# Patient Record
Sex: Male | Born: 1975 | Race: Black or African American | Hispanic: No | Marital: Single | Smoking: Never smoker
Health system: Southern US, Community
[De-identification: ages and names within clinical notes are randomized; demographics above are authoritative.]

## PROBLEM LIST (undated history)

## (undated) DIAGNOSIS — I679 Cerebrovascular disease, unspecified: Secondary | ICD-10-CM

## (undated) DIAGNOSIS — I10 Essential (primary) hypertension: Secondary | ICD-10-CM

## (undated) DIAGNOSIS — I509 Heart failure, unspecified: Secondary | ICD-10-CM

## (undated) DIAGNOSIS — D649 Anemia, unspecified: Secondary | ICD-10-CM

## (undated) DIAGNOSIS — R0602 Shortness of breath: Secondary | ICD-10-CM

## (undated) DIAGNOSIS — I313 Pericardial effusion (noninflammatory): Secondary | ICD-10-CM

## (undated) DIAGNOSIS — N184 Chronic kidney disease, stage 4 (severe): Secondary | ICD-10-CM

## (undated) DIAGNOSIS — I3139 Other pericardial effusion (noninflammatory): Secondary | ICD-10-CM

## (undated) DIAGNOSIS — I428 Other cardiomyopathies: Secondary | ICD-10-CM

---

## 1998-04-04 ENCOUNTER — Emergency Department (HOSPITAL_COMMUNITY): Admission: EM | Admit: 1998-04-04 | Discharge: 1998-04-04 | Payer: Self-pay | Admitting: Emergency Medicine

## 1999-07-03 ENCOUNTER — Encounter: Payer: Self-pay | Admitting: Emergency Medicine

## 1999-07-03 ENCOUNTER — Emergency Department (HOSPITAL_COMMUNITY): Admission: EM | Admit: 1999-07-03 | Discharge: 1999-07-03 | Payer: Self-pay | Admitting: Internal Medicine

## 2000-02-18 ENCOUNTER — Emergency Department (HOSPITAL_COMMUNITY): Admission: EM | Admit: 2000-02-18 | Discharge: 2000-02-18 | Payer: Self-pay | Admitting: Emergency Medicine

## 2001-04-13 ENCOUNTER — Emergency Department (HOSPITAL_COMMUNITY): Admission: AC | Admit: 2001-04-13 | Discharge: 2001-04-14 | Payer: Self-pay

## 2001-04-15 ENCOUNTER — Emergency Department (HOSPITAL_COMMUNITY): Admission: EM | Admit: 2001-04-15 | Discharge: 2001-04-15 | Payer: Self-pay | Admitting: Emergency Medicine

## 2001-04-20 ENCOUNTER — Emergency Department (HOSPITAL_COMMUNITY): Admission: EM | Admit: 2001-04-20 | Discharge: 2001-04-20 | Payer: Self-pay | Admitting: Emergency Medicine

## 2001-04-22 ENCOUNTER — Encounter: Payer: Self-pay | Admitting: Emergency Medicine

## 2001-04-22 ENCOUNTER — Emergency Department (HOSPITAL_COMMUNITY): Admission: EM | Admit: 2001-04-22 | Discharge: 2001-04-22 | Payer: Self-pay | Admitting: Emergency Medicine

## 2001-04-25 ENCOUNTER — Encounter: Admission: RE | Admit: 2001-04-25 | Discharge: 2001-04-25 | Payer: Self-pay | Admitting: Internal Medicine

## 2001-05-09 ENCOUNTER — Encounter: Admission: RE | Admit: 2001-05-09 | Discharge: 2001-05-09 | Payer: Self-pay | Admitting: Internal Medicine

## 2001-11-21 ENCOUNTER — Encounter: Payer: Self-pay | Admitting: Emergency Medicine

## 2001-11-21 ENCOUNTER — Emergency Department (HOSPITAL_COMMUNITY): Admission: EM | Admit: 2001-11-21 | Discharge: 2001-11-21 | Payer: Self-pay | Admitting: Emergency Medicine

## 2002-02-25 ENCOUNTER — Encounter: Payer: Self-pay | Admitting: *Deleted

## 2002-02-25 ENCOUNTER — Emergency Department (HOSPITAL_COMMUNITY): Admission: EM | Admit: 2002-02-25 | Discharge: 2002-02-25 | Payer: Self-pay

## 2002-06-04 ENCOUNTER — Encounter: Payer: Self-pay | Admitting: Emergency Medicine

## 2002-06-04 ENCOUNTER — Emergency Department (HOSPITAL_COMMUNITY): Admission: EM | Admit: 2002-06-04 | Discharge: 2002-06-04 | Payer: Self-pay | Admitting: Emergency Medicine

## 2002-07-06 ENCOUNTER — Emergency Department (HOSPITAL_COMMUNITY): Admission: EM | Admit: 2002-07-06 | Discharge: 2002-07-06 | Payer: Self-pay | Admitting: Emergency Medicine

## 2002-07-06 ENCOUNTER — Encounter: Payer: Self-pay | Admitting: Emergency Medicine

## 2002-12-28 ENCOUNTER — Emergency Department (HOSPITAL_COMMUNITY): Admission: EM | Admit: 2002-12-28 | Discharge: 2002-12-28 | Payer: Self-pay | Admitting: Emergency Medicine

## 2003-02-28 ENCOUNTER — Emergency Department (HOSPITAL_COMMUNITY): Admission: EM | Admit: 2003-02-28 | Discharge: 2003-02-28 | Payer: Self-pay | Admitting: Emergency Medicine

## 2004-01-12 ENCOUNTER — Emergency Department (HOSPITAL_COMMUNITY): Admission: EM | Admit: 2004-01-12 | Discharge: 2004-01-12 | Payer: Self-pay | Admitting: Family Medicine

## 2004-10-27 ENCOUNTER — Emergency Department (HOSPITAL_COMMUNITY): Admission: EM | Admit: 2004-10-27 | Discharge: 2004-10-27 | Payer: Self-pay | Admitting: Emergency Medicine

## 2005-05-20 ENCOUNTER — Emergency Department (HOSPITAL_COMMUNITY): Admission: EM | Admit: 2005-05-20 | Discharge: 2005-05-20 | Payer: Self-pay | Admitting: Emergency Medicine

## 2005-10-21 ENCOUNTER — Emergency Department (HOSPITAL_COMMUNITY): Admission: EM | Admit: 2005-10-21 | Discharge: 2005-10-21 | Payer: Self-pay | Admitting: Emergency Medicine

## 2006-06-02 ENCOUNTER — Emergency Department (HOSPITAL_COMMUNITY): Admission: EM | Admit: 2006-06-02 | Discharge: 2006-06-02 | Payer: Self-pay | Admitting: Emergency Medicine

## 2006-11-07 ENCOUNTER — Emergency Department (HOSPITAL_COMMUNITY): Admission: EM | Admit: 2006-11-07 | Discharge: 2006-11-07 | Payer: Self-pay | Admitting: Family Medicine

## 2007-01-06 ENCOUNTER — Emergency Department (HOSPITAL_COMMUNITY): Admission: EM | Admit: 2007-01-06 | Discharge: 2007-01-06 | Payer: Self-pay | Admitting: Family Medicine

## 2007-04-09 ENCOUNTER — Emergency Department (HOSPITAL_COMMUNITY): Admission: EM | Admit: 2007-04-09 | Discharge: 2007-04-09 | Payer: Self-pay | Admitting: Emergency Medicine

## 2007-05-31 ENCOUNTER — Emergency Department (HOSPITAL_COMMUNITY): Admission: EM | Admit: 2007-05-31 | Discharge: 2007-06-01 | Payer: Self-pay | Admitting: Emergency Medicine

## 2007-06-01 ENCOUNTER — Emergency Department (HOSPITAL_COMMUNITY): Admission: EM | Admit: 2007-06-01 | Discharge: 2007-06-02 | Payer: Self-pay | Admitting: Emergency Medicine

## 2007-06-07 ENCOUNTER — Emergency Department (HOSPITAL_COMMUNITY): Admission: EM | Admit: 2007-06-07 | Discharge: 2007-06-07 | Payer: Self-pay | Admitting: Emergency Medicine

## 2007-06-08 ENCOUNTER — Emergency Department (HOSPITAL_COMMUNITY): Admission: EM | Admit: 2007-06-08 | Discharge: 2007-06-08 | Payer: Self-pay | Admitting: Emergency Medicine

## 2007-08-04 ENCOUNTER — Emergency Department (HOSPITAL_COMMUNITY): Admission: EM | Admit: 2007-08-04 | Discharge: 2007-08-04 | Payer: Self-pay | Admitting: Emergency Medicine

## 2007-11-17 ENCOUNTER — Emergency Department (HOSPITAL_COMMUNITY): Admission: EM | Admit: 2007-11-17 | Discharge: 2007-11-17 | Payer: Self-pay | Admitting: Emergency Medicine

## 2007-11-18 ENCOUNTER — Emergency Department (HOSPITAL_COMMUNITY): Admission: EM | Admit: 2007-11-18 | Discharge: 2007-11-18 | Payer: Self-pay | Admitting: *Deleted

## 2008-01-28 ENCOUNTER — Emergency Department (HOSPITAL_COMMUNITY): Admission: EM | Admit: 2008-01-28 | Discharge: 2008-01-28 | Payer: Self-pay | Admitting: Family Medicine

## 2008-02-26 ENCOUNTER — Emergency Department (HOSPITAL_COMMUNITY): Admission: EM | Admit: 2008-02-26 | Discharge: 2008-02-26 | Payer: Self-pay | Admitting: Emergency Medicine

## 2008-03-03 ENCOUNTER — Emergency Department (HOSPITAL_COMMUNITY): Admission: EM | Admit: 2008-03-03 | Discharge: 2008-03-03 | Payer: Self-pay | Admitting: Emergency Medicine

## 2008-03-08 ENCOUNTER — Emergency Department (HOSPITAL_COMMUNITY): Admission: EM | Admit: 2008-03-08 | Discharge: 2008-03-09 | Payer: Self-pay | Admitting: Podiatry

## 2008-03-08 ENCOUNTER — Ambulatory Visit: Payer: Self-pay | Admitting: Cardiology

## 2008-03-09 ENCOUNTER — Encounter: Payer: Self-pay | Admitting: Cardiology

## 2008-03-09 ENCOUNTER — Ambulatory Visit: Payer: Self-pay | Admitting: Cardiology

## 2008-03-19 ENCOUNTER — Ambulatory Visit: Payer: Self-pay | Admitting: Cardiology

## 2008-03-24 ENCOUNTER — Emergency Department (HOSPITAL_COMMUNITY): Admission: EM | Admit: 2008-03-24 | Discharge: 2008-03-24 | Payer: Self-pay | Admitting: Emergency Medicine

## 2008-04-02 ENCOUNTER — Ambulatory Visit: Payer: Self-pay

## 2008-04-02 LAB — CONVERTED CEMR LAB
BUN: 14 mg/dL (ref 6–23)
CO2: 29 meq/L (ref 19–32)
Calcium: 9 mg/dL (ref 8.4–10.5)
Chloride: 105 meq/L (ref 96–112)
Creatinine, Ser: 1.2 mg/dL (ref 0.4–1.5)
GFR calc Af Amer: 90 mL/min
GFR calc non Af Amer: 74 mL/min
Glucose, Bld: 87 mg/dL (ref 70–99)
Potassium: 4.3 meq/L (ref 3.5–5.1)
Sodium: 138 meq/L (ref 135–145)

## 2008-05-07 ENCOUNTER — Emergency Department (HOSPITAL_COMMUNITY): Admission: EM | Admit: 2008-05-07 | Discharge: 2008-05-07 | Payer: Self-pay | Admitting: Family Medicine

## 2008-06-01 ENCOUNTER — Emergency Department (HOSPITAL_COMMUNITY): Admission: EM | Admit: 2008-06-01 | Discharge: 2008-06-01 | Payer: Self-pay | Admitting: Family Medicine

## 2008-06-01 ENCOUNTER — Emergency Department (HOSPITAL_COMMUNITY): Admission: EM | Admit: 2008-06-01 | Discharge: 2008-06-01 | Payer: Self-pay | Admitting: Emergency Medicine

## 2008-06-04 ENCOUNTER — Ambulatory Visit (HOSPITAL_COMMUNITY): Admission: RE | Admit: 2008-06-04 | Discharge: 2008-06-04 | Payer: Self-pay | Admitting: Chiropractic Medicine

## 2008-06-05 ENCOUNTER — Emergency Department (HOSPITAL_COMMUNITY): Admission: EM | Admit: 2008-06-05 | Discharge: 2008-06-05 | Payer: Self-pay | Admitting: Emergency Medicine

## 2008-08-18 ENCOUNTER — Emergency Department (HOSPITAL_COMMUNITY): Admission: EM | Admit: 2008-08-18 | Discharge: 2008-08-18 | Payer: Self-pay | Admitting: Emergency Medicine

## 2008-11-03 ENCOUNTER — Emergency Department (HOSPITAL_COMMUNITY): Admission: EM | Admit: 2008-11-03 | Discharge: 2008-11-03 | Payer: Self-pay | Admitting: Emergency Medicine

## 2009-02-03 ENCOUNTER — Emergency Department (HOSPITAL_COMMUNITY): Admission: EM | Admit: 2009-02-03 | Discharge: 2009-02-03 | Payer: Self-pay | Admitting: Emergency Medicine

## 2009-02-04 ENCOUNTER — Inpatient Hospital Stay (HOSPITAL_COMMUNITY): Admission: EM | Admit: 2009-02-04 | Discharge: 2009-02-06 | Payer: Self-pay | Admitting: Emergency Medicine

## 2009-08-18 ENCOUNTER — Inpatient Hospital Stay (HOSPITAL_COMMUNITY): Admission: EM | Admit: 2009-08-18 | Discharge: 2009-08-27 | Payer: Self-pay | Admitting: Emergency Medicine

## 2009-08-18 ENCOUNTER — Ambulatory Visit: Payer: Self-pay | Admitting: Internal Medicine

## 2009-08-21 ENCOUNTER — Encounter: Payer: Self-pay | Admitting: Internal Medicine

## 2009-08-21 ENCOUNTER — Ambulatory Visit: Payer: Self-pay | Admitting: Cardiothoracic Surgery

## 2009-08-26 ENCOUNTER — Encounter: Payer: Self-pay | Admitting: Cardiology

## 2009-08-30 ENCOUNTER — Telehealth: Payer: Self-pay | Admitting: Internal Medicine

## 2009-09-05 ENCOUNTER — Ambulatory Visit: Payer: Self-pay | Admitting: Internal Medicine

## 2009-10-06 ENCOUNTER — Ambulatory Visit (HOSPITAL_COMMUNITY): Admission: RE | Admit: 2009-10-06 | Discharge: 2009-10-06 | Payer: Self-pay | Admitting: Internal Medicine

## 2009-10-18 ENCOUNTER — Encounter (INDEPENDENT_AMBULATORY_CARE_PROVIDER_SITE_OTHER): Payer: Self-pay | Admitting: *Deleted

## 2010-08-14 ENCOUNTER — Emergency Department (HOSPITAL_COMMUNITY): Admission: EM | Admit: 2010-08-14 | Discharge: 2010-08-14 | Payer: Self-pay | Admitting: Family Medicine

## 2010-08-18 ENCOUNTER — Emergency Department (HOSPITAL_COMMUNITY): Admission: EM | Admit: 2010-08-18 | Discharge: 2010-08-18 | Payer: Self-pay | Admitting: Family Medicine

## 2010-12-10 ENCOUNTER — Encounter: Payer: Self-pay | Admitting: Cardiothoracic Surgery

## 2011-02-01 LAB — POCT I-STAT, CHEM 8
BUN: 14 mg/dL (ref 6–23)
Calcium, Ion: 1.19 mmol/L (ref 1.12–1.32)
Chloride: 104 mEq/L (ref 96–112)

## 2011-02-22 LAB — ALDOSTERONE + RENIN ACTIVITY W/ RATIO
ALDO / PRA Ratio: 7.4 Ratio (ref 0.9–28.9)
Aldosterone: 2 ng/dL
PRA LC/MS/MS: 0.27 ng/mL/h (ref 0.25–5.82)

## 2011-02-22 LAB — RETICULOCYTES
RBC.: 3.22 MIL/uL — ABNORMAL LOW (ref 4.22–5.81)
Retic Ct Pct: 0.7 % (ref 0.4–3.1)

## 2011-02-22 LAB — LUPUS ANTICOAGULANT PANEL
Drvvt confirmation: 1.23 Ratio — ABNORMAL HIGH (ref <1.18)
Lupus Anticoagulant: DETECTED — AB
PTTLA 4:1 Mix: 47.4 secs (ref 36.3–48.8)
PTTLA Confirmation: 8.7 secs — ABNORMAL HIGH (ref <8.0)
dRVVT Incubated 1:1 Mix: 42.1 secs (ref 36.1–47.0)

## 2011-02-22 LAB — PROTEIN, URINE, 24 HOUR
Collection Interval-UPROT: 24 hours
Protein, 24H Urine: 225 mg/d — ABNORMAL HIGH (ref 50–100)
Urine Total Volume-UPROT: 1250 mL

## 2011-02-22 LAB — DIFFERENTIAL
Basophils Absolute: 0 10*3/uL (ref 0.0–0.1)
Eosinophils Absolute: 0 10*3/uL (ref 0.0–0.7)
Eosinophils Relative: 0 % (ref 0–5)
Eosinophils Relative: 2 % (ref 0–5)
Lymphocytes Relative: 14 % (ref 12–46)
Lymphs Abs: 1 10*3/uL (ref 0.7–4.0)
Lymphs Abs: 1.2 10*3/uL (ref 0.7–4.0)
Monocytes Absolute: 1.1 10*3/uL — ABNORMAL HIGH (ref 0.1–1.0)
Monocytes Absolute: 1.6 10*3/uL — ABNORMAL HIGH (ref 0.1–1.0)
Monocytes Relative: 13 % — ABNORMAL HIGH (ref 3–12)

## 2011-02-22 LAB — RENAL FUNCTION PANEL
BUN: 18 mg/dL (ref 6–23)
CO2: 26 mEq/L (ref 19–32)
Chloride: 101 mEq/L (ref 96–112)
Creatinine, Ser: 1.28 mg/dL (ref 0.4–1.5)
Potassium: 3.5 mEq/L (ref 3.5–5.1)

## 2011-02-22 LAB — BASIC METABOLIC PANEL
BUN: 15 mg/dL (ref 6–23)
Calcium: 8.5 mg/dL (ref 8.4–10.5)
Calcium: 8.7 mg/dL (ref 8.4–10.5)
Chloride: 100 mEq/L (ref 96–112)
Chloride: 96 mEq/L (ref 96–112)
Creatinine, Ser: 1.03 mg/dL (ref 0.4–1.5)
GFR calc Af Amer: >60 mL/min (ref >60)
GFR calc Af Amer: >60 mL/min (ref >60)
GFR calc Af Amer: >60 mL/min (ref >60)
GFR calc non Af Amer: >60 mL/min (ref >60)
GFR calc non Af Amer: >60 mL/min (ref >60)
GFR calc non Af Amer: >60 mL/min (ref >60)
Glucose, Bld: 101 mg/dL — ABNORMAL HIGH (ref 70–99)
Glucose, Bld: 102 mg/dL — ABNORMAL HIGH (ref 70–99)
Potassium: 3.7 mEq/L (ref 3.5–5.1)
Potassium: 3.8 mEq/L (ref 3.5–5.1)
Sodium: 134 mEq/L — ABNORMAL LOW (ref 135–145)
Sodium: 138 mEq/L (ref 135–145)

## 2011-02-22 LAB — CARDIAC PANEL(CRET KIN+CKTOT+MB+TROPI)
CK, MB: 1.4 ng/mL (ref 0.3–4.0)
Relative Index: INVALID (ref 0.0–2.5)
Relative Index: INVALID (ref 0.0–2.5)
Troponin I: 0.02 ng/mL (ref 0.00–0.06)
Troponin I: 0.03 ng/mL (ref 0.00–0.06)

## 2011-02-22 LAB — METANEPHRINES, URINE, 24 HOUR: Metaneph Total, Ur: 1232 mcg/24 h — ABNORMAL HIGH (ref 115–695)

## 2011-02-22 LAB — CBC
HCT: 30.3 % — ABNORMAL LOW (ref 39.0–52.0)
HCT: 30.5 % — ABNORMAL LOW (ref 39.0–52.0)
HCT: 31.3 % — ABNORMAL LOW (ref 39.0–52.0)
HCT: 32.9 % — ABNORMAL LOW (ref 39.0–52.0)
Hemoglobin: 10.3 g/dL — ABNORMAL LOW (ref 13.0–17.0)
Hemoglobin: 10.6 g/dL — ABNORMAL LOW (ref 13.0–17.0)
MCHC: 33.7 g/dL (ref 30.0–36.0)
MCV: 91.6 fL (ref 78.0–100.0)
MCV: 92.2 fL (ref 78.0–100.0)
MCV: 92.3 fL (ref 78.0–100.0)
Platelets: 360 10*3/uL (ref 150–400)
Platelets: 364 10*3/uL (ref 150–400)
RBC: 3.3 MIL/uL — ABNORMAL LOW (ref 4.22–5.81)
RBC: 3.33 MIL/uL — ABNORMAL LOW (ref 4.22–5.81)
RDW: 13.5 % (ref 11.5–15.5)
RDW: 13.5 % (ref 11.5–15.5)
WBC: 11.8 10*3/uL — ABNORMAL HIGH (ref 4.0–10.5)
WBC: 7.8 10*3/uL (ref 4.0–10.5)

## 2011-02-22 LAB — CYCLIC CITRUL PEPTIDE ANTIBODY, IGG: Cyclic Citrullin Peptide Ab: 1.8 U/mL (ref <7)

## 2011-02-22 LAB — TSH: TSH: 2.098 u[IU]/mL (ref 0.350–4.500)

## 2011-02-22 LAB — BETA-2-GLYCOPROTEIN I ABS, IGG/M/A
Beta-2 Glyco I IgG: <3 U/mL (ref <=15)
Beta-2-Glycoprotein I IgM: 6 U/mL (ref <=15)

## 2011-02-22 LAB — LIPID PANEL
Cholesterol: 133 mg/dL (ref 0–200)
LDL Cholesterol: 91 mg/dL (ref 0–99)
VLDL: 24 mg/dL (ref 0–40)

## 2011-02-22 LAB — CORTISOL: Cortisol, Plasma: 14.8 ug/dL

## 2011-02-22 LAB — VITAMIN B12: Vitamin B-12: 403 pg/mL (ref 211–911)

## 2011-02-22 LAB — RHEUMATOID FACTOR: Rhuematoid fact SerPl-aCnc: 56 IU/mL — ABNORMAL HIGH (ref 0–20)

## 2011-02-22 LAB — CARDIOLIPIN ANTIBODIES, IGM+IGG: Anticardiolipin IgG: <10 GPL U/mL — ABNORMAL LOW (ref <10)

## 2011-02-22 LAB — COMPLEMENT, TOTAL: Compl, Total (CH50): >60 U/mL — ABNORMAL HIGH (ref 31–60)

## 2011-02-22 LAB — HIV ANTIBODY (ROUTINE TESTING W REFLEX): HIV: NONREACTIVE

## 2011-02-22 LAB — HEPATITIS PANEL, ACUTE
Hep A IgM: NEGATIVE
Hep B C IgM: NEGATIVE
Hepatitis B Surface Ag: NEGATIVE

## 2011-02-22 LAB — CRYOGLOBULIN: Cryoglobulin: DETECTED

## 2011-02-22 LAB — ANTI-RIBONUCLEIC ACID ANTIBODY: Ribonucleic Protein(ENA) Antibody, IgG: >8 AI — ABNORMAL HIGH (ref <1.0)

## 2011-02-22 LAB — C4 COMPLEMENT: Complement C4, Body Fluid: 12 mg/dL — ABNORMAL LOW (ref 16–47)

## 2011-02-22 LAB — GLUCOSE 6 PHOSPHATE DEHYDROGENASE: G-6-PD, Quant: 11 U/g{Hb} (ref 7–20)

## 2011-02-22 LAB — C-REACTIVE PROTEIN: CRP: 10.9 mg/dL — ABNORMAL HIGH (ref <0.6)

## 2011-02-23 LAB — CBC
HCT: 33.5 % — ABNORMAL LOW (ref 39.0–52.0)
MCV: 92.5 fL (ref 78.0–100.0)
Platelets: 280 10*3/uL (ref 150–400)
RDW: 13.6 % (ref 11.5–15.5)
WBC: 9.8 10*3/uL (ref 4.0–10.5)

## 2011-02-23 LAB — URINALYSIS, ROUTINE W REFLEX MICROSCOPIC
Glucose, UA: 100 mg/dL — AB
Ketones, ur: 15 mg/dL — AB
Leukocytes, UA: NEGATIVE
pH: 6 (ref 5.0–8.0)

## 2011-02-23 LAB — TSH: TSH: 0.979 u[IU]/mL (ref 0.350–4.500)

## 2011-02-23 LAB — BASIC METABOLIC PANEL
BUN: 10 mg/dL (ref 6–23)
Chloride: 102 mEq/L (ref 96–112)
Glucose, Bld: 101 mg/dL — ABNORMAL HIGH (ref 70–99)
Potassium: 4.1 mEq/L (ref 3.5–5.1)

## 2011-02-23 LAB — URINE DRUGS OF ABUSE SCREEN W ALC, ROUTINE (REF LAB)
Amphetamine Screen, Ur: NEGATIVE
Barbiturate Quant, Ur: NEGATIVE
Benzodiazepines.: NEGATIVE
Propoxyphene: NEGATIVE

## 2011-02-23 LAB — URINE MICROSCOPIC-ADD ON

## 2011-02-23 LAB — POCT CARDIAC MARKERS
CKMB, poc: 2.1 ng/mL (ref 1.0–8.0)
Myoglobin, poc: 150 ng/mL (ref 12–200)
Troponin i, poc: <0.05 ng/mL (ref 0.00–0.09)

## 2011-02-23 LAB — DIFFERENTIAL
Eosinophils Absolute: 0.1 10*3/uL (ref 0.0–0.7)
Eosinophils Relative: 1 % (ref 0–5)
Lymphs Abs: 1.2 10*3/uL (ref 0.7–4.0)

## 2011-02-23 LAB — CULTURE, BLOOD (ROUTINE X 2): Culture: NO GROWTH

## 2011-02-23 LAB — HEMOGLOBIN A1C: Hgb A1c MFr Bld: 5.6 % (ref 4.6–6.1)

## 2011-02-23 LAB — CK TOTAL AND CKMB (NOT AT ARMC)
Relative Index: INVALID (ref 0.0–2.5)
Total CK: 67 U/L (ref 7–232)

## 2011-03-01 LAB — CBC
HCT: 41.5 % (ref 39.0–52.0)
MCHC: 34.1 g/dL (ref 30.0–36.0)
MCV: 93.6 fL (ref 78.0–100.0)
Platelets: 226 10*3/uL (ref 150–400)
RBC: 4.44 MIL/uL (ref 4.22–5.81)
RDW: 13.2 % (ref 11.5–15.5)
WBC: 7.7 10*3/uL (ref 4.0–10.5)

## 2011-03-01 LAB — COMPREHENSIVE METABOLIC PANEL
AST: 46 U/L — ABNORMAL HIGH (ref 0–37)
Albumin: 4.4 g/dL (ref 3.5–5.2)
Alkaline Phosphatase: 93 U/L (ref 39–117)
CO2: 28 mEq/L (ref 19–32)
Chloride: 101 mEq/L (ref 96–112)
Creatinine, Ser: 1.11 mg/dL (ref 0.4–1.5)
GFR calc Af Amer: >60 mL/min (ref >60)
GFR calc non Af Amer: >60 mL/min (ref >60)
Potassium: 4.3 mEq/L (ref 3.5–5.1)
Total Bilirubin: 0.5 mg/dL (ref 0.3–1.2)

## 2011-03-01 LAB — DIFFERENTIAL
Basophils Absolute: 0 10*3/uL (ref 0.0–0.1)
Basophils Relative: 0 % (ref 0–1)
Eosinophils Absolute: 0 10*3/uL (ref 0.0–0.7)
Eosinophils Relative: 1 % (ref 0–5)
Lymphocytes Relative: 19 % (ref 12–46)
Monocytes Absolute: 0.6 10*3/uL (ref 0.1–1.0)

## 2011-03-01 LAB — CARDIAC PANEL(CRET KIN+CKTOT+MB+TROPI)
CK, MB: 3.4 ng/mL (ref 0.3–4.0)
CK, MB: 4 ng/mL (ref 0.3–4.0)
Relative Index: 2.4 (ref 0.0–2.5)
Total CK: 145 U/L (ref 7–232)
Total CK: 170 U/L (ref 7–232)
Troponin I: 0.02 ng/mL (ref 0.00–0.06)

## 2011-03-01 LAB — LIPID PANEL
Cholesterol: 164 mg/dL (ref 0–200)
Total CHOL/HDL Ratio: 6.3 RATIO
VLDL: 26 mg/dL (ref 0–40)

## 2011-03-01 LAB — BASIC METABOLIC PANEL
BUN: 18 mg/dL (ref 6–23)
Calcium: 8.4 mg/dL (ref 8.4–10.5)
Creatinine, Ser: 1.31 mg/dL (ref 0.4–1.5)
GFR calc non Af Amer: >60 mL/min (ref >60)
Glucose, Bld: 102 mg/dL — ABNORMAL HIGH (ref 70–99)
Sodium: 136 mEq/L (ref 135–145)

## 2011-03-01 LAB — POCT CARDIAC MARKERS
Troponin i, poc: <0.05 ng/mL (ref 0.00–0.09)
Troponin i, poc: <0.05 ng/mL (ref 0.00–0.09)

## 2011-04-03 NOTE — Discharge Summary (Signed)
[name], [name]               ACCOUNT NO.:  [id number]   MEDICAL RECORD NO.:  [id number]          PATIENT TYPE:  INP   LOCATION:  1437                         FACILITY:  [facility]   PHYSICIAN:  Monte Fantasia, MD  DATE OF BIRTH:  1976-04-11   DATE OF ADMISSION:  02/03/2009  DATE OF DISCHARGE:  02/06/2009                               DISCHARGE SUMMARY   PRIMARY CARE PHYSICIAN:  Unassigned.   DISCHARGE DIAGNOSES:  1. Hypertensive urgency.  2. Uncontrolled hypertension.  3. Medical noncompliance due to loss of insurance.  4. Incipient dental infection.  5. Acute bronchitis.   DISCHARGE MEDICATIONS:  1. Hydrochlorothiazide 25 mg p.o. daily.  2. Benazepril 40 mg p.o. daily.  3. Carvedilol 25 mg p.o. b.i.d.  4. Clonidine 0.1 mg p.o. t.i.d.  5. Clindamycin 300 mg p.o. t.i.d. for 2 days.  6. Lactinex 1 tablet four times a day for 1 week.   HOSPITAL COURSE:  [name] is a 35 year old African American  gentleman patient who was admitted on March 19 with complaints of  nausea, vomiting and headache.  The patient on arrival in the emergency  room had a blood pressure found to be elevated to 209/127.  The patient  was admitted for hypertensive urgency and was started on nitroglycerin  drip.  The patient was admitted to the step down and blood pressure was  controlled with IV medications on telemetry monitoring.  The patient's  cardiac enzymes were cycled x3 which were negative.  The patient was  successfully transitioned to oral medications.  The patient's blood  pressure has been controlled for the last 24 hours on oral medications  and the patient is medically stable to be discharged.   LABS UPON DISCHARGE:  Total WBC 8.4, hemoglobin 12.4, hematocrit 36.4,  platelets of 226, sodium 136, potassium 3.5, chloride 104, bicarb 25,  glucose 102, BUN 18, creatinine 1.3.  Cardiac markers x3 sets have been  negative.  Total cholesterol 164, triglycerides 130, HDL 26, LDL 112,  TSH 0.938, homocysteine 9.2.   The patient at present does not have any medical insurance and hence was  given medications supply collected from the [facility] 4 dollar list.  Also  social work evaluation was asked for the same and we will give 3 day  supply at the time of discharge.  The patient is given [employer]  numbers to contact and asked to be following up with [employer] within  2 weeks.  The patient at present is medically stable to be discharged  and can be discharged home.   DISPOSITION:  The patient at present is medically stable to be  discharged and will be discharged home today.  Will give him 3 day  supply for the antihypertensive medication.      Monte Fantasia, MD  Electronically Signed     MP/MEDQ  D:  02/06/2009  T:  02/06/2009  Job:  [id number]

## 2011-04-03 NOTE — Discharge Summary (Signed)
Matthew Beard, Matthew Beard               ACCOUNT NO.:  [id number]   MEDICAL RECORD NO.:  [id number]          PATIENT TYPE:  OBV   LOCATION:  0108                         FACILITY:  [facility]   PHYSICIAN:  Rollene Rotunda, MD, FACCDATE OF BIRTH:  12-14-1975   DATE OF ADMISSION:  03/08/2008  DATE OF DISCHARGE:                               DISCHARGE SUMMARY   DISCHARGE DIAGNOSES:  1. Pleuritic chest pain.  2. Penile ulcer.  3. Hypertension.   HOSPITAL COURSE:  The patient presented to the hospital with chest  discomfort.  The patient developed this on the day before admission.  He  describes it as sharp.  He felt discomfort taking a deep breath.  He did  have some slight fevers and chills.  He took some Tylenol and Motrin  without improvement.  It was 9 out of 10 at its worst.  He came to the  emergency room.  He was treated with Toradol.  He had no improvement.  He subsequently got nitroglycerin.  There was no improvement.  He  finally got morphine.  On physical exam, a rub was appreciated.  His  enzymes were negative.  His EKG demonstrated LVH by voltage criteria.  There was some diffuse ST segment elevation, and T-wave inversion  consistent with repolarization changes with a possible pericarditis.   Of note, the patient had recently been treated symptomatically for an  enlarged lymph node in his right groin.  He had a penile ulcer that had  been seen at the health department and treated for chlamydia.  He did  have a follow up with urology on the day he was in the emergency room.   Of note, his blood pressure was elevated, but he had not gotten  prescriptions for Lotrel and hydrochlorothiazide refilled.  On the day  of discharge, the patient was still complaining of some mild pleuritic-  type chest discomfort.  It seemed to have gone to his shoulder.  There  were no enzyme elevations.  All of his troponins were negative.  His CT  done in the ER to evaluate a mildly elevated D-dimer  demonstrated no  evidence of pulmonary emboli.  There was a minimal atelectasis at the  lung bases.  There was no adenopathy.  His aorta was normal.  He said he  had improved and was ready to go home.   At the time of this discharge, an echocardiogram had been performed.  The preliminary demonstrated some minimal LVH but no regional wall  motion abnormalities.  He had normal LV function and no significant  valvular abnormalities.  The final is pending.   During this hospitalization, we did get urology to see him for his  penile ulcer.  They suggested that this could be penile cancer but more  likely infection.  The differential included syphilis, chancroid, and  lymphogranuloma venereum.  It was less likely to be HSV or granuloma  inguinale.  FTA antibodies were drawn, and HIV was drawn.  He was  empirically given one dose of azithromycin.  He had instructions written  out to treat him with doxycycline 100  mg b.i.d. times three weeks.   DISCHARGE MEDICATIONS:  1. Lotrel 10/20 mg daily.  2. Hydrochlorothiazide 12.5 mg daily.  3. Indomethacin 50 mg q 8 hours times 10 days total.  4. Doxycycline 100 mg p.o. b.i.d. times three weeks.   DISCHARGE FOLLOW UP:  The patient was given a follow up with Dr. Laverle Patter  on May the 22nd at 2:15.  His office was going to call when they knew of  a time they could move this up by one week per Dr. Vevelyn Royals  instructions.  I tried to get this over the phone but was told that they  would have to speak with Dr. Laverle Patter personally, and that they would  arrange this follow up at a sooner date.  The patient understands the  importance of this follow up.  He may need a biopsy of his swollen lymph  node.  He may need a biopsy of the ulcer.  The patient will also see Dr.  Antoine Poche on Friday May the 15th at the [facility] office at 3:45 p.m.   SPECIAL INSTRUCTIONS:  The patient is to stop smoking.  He is to avoid  drugs (his marijuana test was positive).  He  should avoid sexual contact  until he has this ulcer identified and appropriately treated.  He is to  take his medications as listed.  He needs to return to the emergency  room if he has any worsening symptoms.      Rollene Rotunda, MD, [facility]  Electronically Signed     JH/MEDQ  D:  03/09/2008  T:  03/09/2008  Job:  [id number]

## 2011-04-03 NOTE — H&P (Signed)
NAMEJASANI, DOLNEY               ACCOUNT NO.:  [id number]   MEDICAL RECORD NO.:  [id number]          PATIENT TYPE:  INP   LOCATION:  1240                         FACILITY[facility]  [facility]   PHYSICIAN:  Vania Rea, M.D. DATE OF BIRTH:  [birth year]   DATE OF ADMISSION:  02/03/2009  DATE OF DISCHARGE:                              HISTORY & PHYSICAL   PRIMARY CARE PHYSICIAN:  Unassigned.   CHIEF COMPLAINT:  Headache and vomiting.   HISTORY OF PRESENT ILLNESS:  This is a 35 year old African American  gentleman with a history of hypertension who for the past week has been  having wheezing and cough productive of clear sputum and also for the  past few days developed pain in his left jaw and ear.  Came to the  emergency room yesterday morning and was evaluated and felt to be having  an infected tooth socket causing referred earache.  Was given an  injection of Dilaudid, sent home with Vicodin and told to follow up with  a dentist.  The patient was also checked and found to have elevated  blood pressure and advised to resume his home medications.  On arriving  home, before he could take any of the prescribed medications, the  patient started to vomit and developed severe headache.  He returned to  the emergency room where his blood pressure was found to be elevated  209/127.  He was assessed as having hypertensive urgency and started on  __________ drip, and the hospitalist service called to assist with  management.   The patient denies any fever or runny nose.  He denies any exposure to  sick contacts.  He denies any past history of asthma.  He recently lost  his job and therefore no longer has a primary care physician, and in  fact before the past 24 hours has not taken any antihypertensives for at  least 2 month.  He has had no syncope or chest pain.   PAST MEDICAL HISTORY:  1. Hypertension.  2. Episodic headaches when blood pressure is not controlled.   MEDICATIONS:  Has been  noncompliant but has been prescribed  hydrochlorothiazide, amlodipine, benazepril, carvedilol.  Today, he was  prescribed albuterol for his wheezing and Vicodin for his earache.   ALLERGIES:  AMOXIL causes hives.   SOCIAL HISTORY:  Smokes half a pack per day.  Denies alcohol or illicit  drug use.  He is an unemployed Estate agent.   FAMILY HISTORY:  Is significant for heart attacks and strokes in both  parents who have died from these conditions.   REVIEW OF SYSTEMS:  Other than noted above, a 10-point review of systems  is unremarkable.   PHYSICAL EXAMINATION:  A well-built young African American gentleman  sitting up on the stretcher.  Does not appear acutely distressed.  VITALS:  His temperature is 97.7, pulse 84, respiration 18, blood  pressure now 176/116 on a Cardene drip.  He saturating at 100% on room  air.  He is having no pain.  Pupils are round and equal.  Mucous membranes pink and anicteric.  He  has no  oral Candida.  No jugular venous distention.  His left jaw is  somewhat prominent and swollen but not tender.  EARS: The left ear is  unremarkable.  The right ear has accumulation of pus.  No thyromegaly or  jugular venous distention.  CHEST:  He has diffuse rhonchi bilaterally.  CARDIOVASCULAR SYSTEM:  Regular rhythm with no murmurs.  ABDOMEN:  Is soft and nontender.  No masses.  EXTREMITIES:  Without edema.  He has 2+ dorsalis pedis pulses  bilaterally.  CENTRAL NERVOUS SYSTEM:  Cranial nerves II-XII are grossly, and he has  no focal neurologic deficit.   LABORATORY DATA:  CBC is reviewed and is unremarkable.  His serum  chemistry is reviewed and is likewise unremarkable.  His cardiac enzymes  are completely normal.  CT scan of his brain showed no acute  abnormality.  Chest x-ray has not yet been done.   ASSESSMENT:  1. Hypertensive urgency.  2. Incipient dental infection.  3. Acute bronchitis.   PLAN:  Will admit this gentleman for treatment of his  hypertensive  urgency.  Will transition to oral medications as soon as possible.  We  will give oral clindamycin for his incipient dental infection and will  cover his GI tract with Lactinex.  Will give Xopenex for his wheezing.  Other plans as per orders.      Vania Rea, M.D.  Electronically Signed     LC/MEDQ  D:  02/04/2009  T:  02/04/2009  Job:  [id number]

## 2011-04-03 NOTE — H&P (Signed)
NAMEGALAN, Matthew Beard               ACCOUNT NO.:  [id number]   MEDICAL RECORD NO.:  [id number]          PATIENT TYPE:  OBV   LOCATION:  0108                         FACILITY:  [facility]   PHYSICIAN:  Rollene Rotunda, MD, FACCDATE OF BIRTH:  06/04/1976   DATE OF ADMISSION:  03/08/2008  DATE OF DISCHARGE:                              HISTORY & PHYSICAL   Matthew Beard is a 35 year old, African-American gentleman with no known  history of coronary artery disease but with history of elevated blood  pressure who presented to [facility] emergency room complaining of  chest discomfort.  His cardiologist will be Dr. Antoine Poche.  Primary care,  the patient is followed by Matthew [facility] at [facility].  [name] describes an episode of chest discomfort that began Sunday  evening around 6:00.  It was associated with fever and chills.  He  describes it as a sharp pain that leaves him unable to take a deep  breath.  He states he was sitting at the baseball game, felt fine prior,  went home, began to experience discomfort with the fever and chills.  He  took some Tylenol and Motrin.  This relieved the chills but the chest  discomfort continued.  He states he had difficulty sleeping last night  secondary to the chest discomfort.  He described it as a substernal  chest pain radiating into the left side of his back, associated with  some diaphoresis, fever and chills without dizziness or shortness of  breath.  He described it as a 9 at its worse and currently is a 7.  Here  in the emergency room, he was given Toradol; 15 minutes later still  having pain, he was given nitroglycerin and morphine with some relief,  but the chest discomfort continues.  Also, Matthew Beard past medical  history recently includes a workup April 9 for a canker sore to his  penis and enlarged lymph node on the right groin.  The patient states he  was treated with antibiotics and Motrin and told to follow-up with the  help the health department.  He also had a chlamydia of test that was  done that was negative per patient's report.  Matthew Beard states he has  a follow-up appointment scheduled at [facility] tomorrow, but the  chest discomfort became so worrisome that he came to the emergency room  to get that evaluated first.   ALLERGIES:  INCLUDE AMOXICILLIN.   CURRENT MEDICATIONS:  Include Lotrel 10 mg and hydrochlorothiazide 12.5  mg.  The patient states he ran out of these medications one week ago.   PAST MEDICAL HISTORY:  Includes hypertension and a surgery for broken  jaw and ongoing tobacco use.   SOCIAL HISTORY:  He lives here in [place] alone.  He does have a 45-  year-old son.  He works for [employer] first  shift doing a lot of heavy lifting and drinks at least one 6-pack on the  weekend.  Denies any drug or recreational substance use.   FAMILY HISTORY:  Both parents with history of hypertension and both  deceased in their 44s and early 51s secondary to CVA.  He has brothers  with hypertension.   REVIEW OF SYSTEMS:  As stated above in history of present illness,  otherwise negative review of 10 systems.   PHYSICAL EXAMINATION:  Blood pressure 154/99, heart rate 67,  respirations 18, temperature 97.5, saturation 98% on room air.  HEENT:  Normal.  NECK:  Supple without lymphadenopathy, bruits, or JVD.  CARDIOVASCULAR EXAM:  Reveals S1-S2, positive pericardial rub.  LUNGS:  Clear to auscultation bilaterally.  ABDOMEN:  Soft, nontender, positive bowel sounds.  RIGHT GROIN:  Enlarged lymph node palpated.  SKIN:  Is warm and dry.  LOWER EXTREMITIES:  Without clubbing, cyanosis, or edema.  NEUROLOGICALLY:  Alert and oriented x3.   Chest CT:  Negative for pulmonary emboli, minimal atelectasis at lung  bases.  No fluid.  EKG:  Sinus rhythm with diffuse ST elevation, LVH  positive, rate 61.  CBC:  H&H 13.8 and 41.4, WBCs 11.8, platelets  226,000.  D-dimer  0.46.  Point of cares negative x1 set.  Sodium 137,  potassium 4.0, chloride 99, CO2 31, BUN 10, creatinine 1.16, glucose  103.   IMPRESSION:  1. Chest pain, most likely with abnormal EKG and pericardial rub      positive for pericarditis.  2. Hypertension.  Continue home medications.  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  4. Positive tobacco use, smoking cessation.  5. Enlarged lymph node.  Will need inpatient urology consult.  The      plan is to admit patient, treat him with Indocin, Protonix, order 2-      D echocardiogram, have urology see the patient, and will go from      there.      Dorian Pod, ACNP      Rollene Rotunda, MD, [facility]  Electronically Signed    MB/MEDQ  D:  03/08/2008  T:  03/08/2008  Job:  [id number]

## 2011-04-03 NOTE — Consult Note (Signed)
[name], [name]               ACCOUNT NO.:  [id number]   MEDICAL RECORD NO.:  [id number]          PATIENT TYPE:  OBV   LOCATION:  0108                         FACILITY:  [facility]   PHYSICIAN:  Heloise Purpura, MD      DATE OF BIRTH:  [birth year]   DATE OF CONSULTATION:  03/08/2008  DATE OF DISCHARGE:                                 CONSULTATION   REASON FOR CONSULTATION:  Penile lesion.   HISTORY:  Matthew Beard is a 35 year old gentleman seen in consultation at the  request of Dr. Rollene Rotunda for a penile lesion.  He first presented  to the emergency department on February 26, 2008, after noticing a penile  lesion on his right penile shaft.  At that time, this lesion was  painless.  He underwent evaluation including what appears to be culture  which did not demonstrate any organisms.  He also underwent testing for  gonorrhea, chlamydia and an RPR which were all negative.  He was  instructed to take anti-inflammatories and follow up with either the  health department or urology.  He was scheduled to see urology as an  outpatient tomorrow.  Approximately one week after his initial  presentation to the emergency department, he did note enlarged painful  right inguinal lymphadenopathy.  He again presented to the emergency  department and was recommended to use warm compresses and told to keep  his appointment with urology.  He has not been treated with any  antibiotics.  He denies any fever, nausea, vomiting, dysuria, urinary  frequency, urgency or other voiding symptoms.  He denies any hematuria,  urinary tract infections, history of STDs, urolithiasis, GU malignancy,  trauma or surgery.The patient is heterosexual and has been in a  monogamous relationship.  He is sexually active.  His last sexual  encounter was approximately five days prior to his presentation in the  emergency department.   He is admitted today after presenting with chest pain to the emergency  department and is currently  undergoing evaluation by Dr. Rollene Rotunda  for this reason.  It was felt that the patient should be evaluated as he  was scheduled to undergo an outpatient urologic evaluation tomorrow  anyway.   PAST MEDICAL HISTORY:  Hypertension.   PAST SURGICAL HISTORY:  Jaw surgery.   MEDICATIONS:  1. Lotrel.  2. Hydrochlorothiazide.   ALLERGIES:  PENICILLIN CAUSES HIVES.   SOCIAL HISTORY:  The patient does drink at least a six-pack of beer per  weekend.  He does currently smoke.  He denies illicit drug use.  He  lives alone though does have a 27-year-old son.   FAMILY HISTORY:  Both parents had hypertension and died of complications  related to cerebrovascular disease.   REVIEW OF SYSTEMS:  Negative except as in history of present illness.   PHYSICAL EXAMINATION:  VITAL SIGNS:  Temperature 97.5, heart rate 67,  respirations 18, blood pressure 154/99.  CONSTITUTIONAL:  Well-nourished, well-developed, age-appropriate male in  no acute distress.  NECK:  No neck masses or JVD.  CARDIOVASCULAR:  Regular rate and rhythm.  LUNGS:  Clear bilaterally.  Normal respiratory effort.  ABDOMEN:  Soft, nondistended and nontender without masses.  No CVA  tenderness.  GU:  The patient has a circumcised male phallus and normal urethral  meatus.  He does have a 1.5 cm ulcerated lesion toward the right base of  his penis on the lateral aspect of the penile shaft.  This lesion does  have a mildly erythematous base.  It is nontender and not fixed.  There  does not appear to be any invasion of the underlying corporal bodies.  Testes are descended bilaterally and are nontender without masses.  There is no urethral discharge.  LYMPH NODE SURVEY:  The patient has no left inguinal lymphadenopathy.  He does have a 3 cm palpable right inguinal lymph node which is tender  on palpation.  DRE:  Normal sphincter tone without rectal masses.  No prostate  tenderness or nodularity.  EXTREMITIES:  No edema.   NEUROLOGIC:  Grossly intact.  PSYCHIATRIC:  Normal mood and affect.   LABORATORY DATA:  Urine drug screen is positive for opiates and THC.  Serum creatinine is 1.1.  White blood count is within normal limits.  The GC and chlamydia probe tests are both negative.  RPR is nonreactive.  All these tests are from February 26, 2008.   IMPRESSION/RECOMMENDATIONS:  Penile lesion.  The patient has a nontender  ulcerated penile lesion with tender inguinal lymphadenopathy.  Possibilities would include infection versus carcinoma.  Based on the  patient's age and the fact that he has been circumcised from birth, this  would most likely be an infectious lesion.  I would recommend further  evaluation for sexually transmitted diseases.  It will be appropriate to  check his HIV status as well as to check for treponemal antibodies.  His  prior wound culture was negative.  In the absence of a definitive  diagnosis, I will plan to treat him empirically.  Due to his penicillin  allergy, this will include doxycycline and azithromycin which should  cover him for syphilis and lymphogranuloma venereum and chancroid.  I  will plan to re-evaluate him as an outpatient in approximately one week.  If his clinical course is not improving at that time, we may consider a  biopsy of his penile lesion.      Heloise Purpura, MD  Electronically Signed     LB/MEDQ  D:  03/09/2008  T:  03/09/2008  Job:  [id number]   cc:   Rollene Rotunda, MD, [facility]  [address]  [address]  [place]  [place] [address]

## 2011-04-03 NOTE — Assessment & Plan Note (Signed)
Matthew Beard                            CARDIOLOGY OFFICE NOTE   Matthew Beard, Matthew Beard                      MRN:          [id number]  DATE:03/19/2008                            DOB:          [birth year]    PRIMARY CARE PHYSICIAN:  Jethro Bastos, M.D.   REASON FOR PRESENTATION:  Evaluate patient with chest discomfort.   HISTORY OF PRESENT ILLNESS:  The patient was seen in the hospital on  March 07, 2008.  He had chest discomfort.  I did feel like I heard a rub  on exam, though an echocardiogram demonstrated no effusion or  pericardial thickening.  He was treated empirically on discharge with  Indocin.  During that overnight hospital stay, he ruled out for  myocardial infarction.  He did have an EKG with deep T-wave inversions  in the lateral leads.  I did not have an old EKG for comparison.   Also of note during that exam, he was seen by urology for management of  a penile lesion.  He was treated with a course of antibiotics per their  recommendation.  He also has a swollen right inguinal lymph node and is  due to see them in followup in a few days.   The patient continues to have some discomfort in his left shoulder.  It  is sharp.  It comes and goes.  He has no substernal chest pressure.  He  says this discomfort is more at night.  He has tried [employer] and [employer], and it does not improve.  It seems to be somewhat constant.  It  does not radiate to his jaw or his back.  It radiates slightly down his  left arm.  There is no associated nausea, vomiting, diaphoresis.  He is  not describing any palpitation, presyncope or syncope.   PAST MEDICAL HISTORY:  1. Penile ulcer.  2. Hypertension.  3. Surgery for a broken jaw.  4. Ongoing tobacco abuse.   ALLERGIES:  NONE.   MEDICATIONS:  1. Lotrel 10/20.  2. Hydrochlorothiazide 12.5 daily.  3. Indomethacin (he is done with this prescription).  4. Doxycycline 100 mg b.i.d. x3 weeks.   REVIEW OF  SYSTEMS:  As stated in the HPI and negative for other systems.   PHYSICAL EXAMINATION:  GENERAL:  The patient is in no distress.  VITAL SIGNS:  Blood pressure 173/112, heart rate 63 and regular, weight  194 pounds, body mass index 29.  HEENT:  Eyes are unremarkable.  Pupils are equal, round and reactive to  light.  Fundi not visualized.  Oral mucosa unremarkable.  NECK:  No jugular venous distention at 45 degrees.  Carotid upstrokes  are brisk and symmetric.  No bruits, no thyromegaly.  LYMPHATICS:  No cervical or axillary adenopathy.  There is still a  tender large right inguinal node.  BACK:  No costovertebral angle tenderness.  LUNGS:  Clear to auscultation bilaterally.  HEART:  PMI not displaced or sustained, S1-S2 within normal limits.  No  S3-S4, no clicks, rubs, murmurs.  ABDOMEN:  Flat with positive bowel sounds normal  in frequency and pitch,  no bruits, rebound, guarding, no midline pulsatile mass.  No  hepatomegaly.  No splenomegaly.  SKIN:  No rashes.  No nodules.  EXTREMITIES:  2+ pulses throughout, no edema, cyanosis, clubbing.  NEURO:  Oriented to person, place, time.  Cranial nerves II-XII are  grossly intact.  Motor grossly intact.   DIAGNOSTICS:  EKG sinus rhythm, rate 79, axis within normal limits, left  ventricle hypertrophy by voltage criteria, T-wave inversions in lateral  leads with possible repolarization, but could not exclude ischemia.   ASSESSMENT/PLAN:  1. Shoulder discomfort.  The patient has shoulder discomfort that has      been persistent.  He has an abnormal EKG.  He has a very vigorous      job.  Given this, he needs to be screened for coronary disease.  I      will send him for an exercise perfusion study.  Further evaluation      will be based on these results.  If there is no evidence of      ischemia or infarct on this exam, then no further cardiovascular      testing is suggested.  This may as Dr. Dorothe Pea suggests be      musculoskeletal or  neuropathic in origin.  2. Hypertension.  Blood pressure is elevated.  I am going to switch      him to Lotrel 10/40.  He needs to get a BMET in 2 weeks.  He needs      to have this followed closely by his primary care doctor.  3. Penile ulcers/lymphadenopathy.  He has been told that he needs to      definitely keep his followup.  This was thought to be infectious      according to the urology note.  However, they could not exclude a      cancer.  He is completing his course of doxycycline.  4. Tobacco.  He understands the need to stop smoking.  He is also to      avoid other drugs as he did test positive for marijuana.   FOLLOW UP:  He can come back to this clinic as needed based on the  results of the above.     Rollene Rotunda, MD, [facility]  Electronically Signed    JH/MedQ  DD: 03/19/2008  DT: 03/19/2008  Job #: [id number]   cc:   Jethro Bastos, M.D.

## 2011-04-15 ENCOUNTER — Emergency Department (HOSPITAL_COMMUNITY)
Admission: EM | Admit: 2011-04-15 | Discharge: 2011-04-15 | Disposition: A | Discharge status: Home / Self Care | Payer: Self-pay | Attending: Emergency Medicine | Admitting: Emergency Medicine

## 2011-04-15 ENCOUNTER — Emergency Department (HOSPITAL_COMMUNITY): Payer: Self-pay

## 2011-04-15 DIAGNOSIS — M25529 Pain in unspecified elbow: Secondary | ICD-10-CM | POA: Insufficient documentation

## 2011-04-15 DIAGNOSIS — M546 Pain in thoracic spine: Secondary | ICD-10-CM | POA: Insufficient documentation

## 2011-04-15 DIAGNOSIS — I10 Essential (primary) hypertension: Secondary | ICD-10-CM | POA: Insufficient documentation

## 2011-04-15 DIAGNOSIS — G43909 Migraine, unspecified, not intractable, without status migrainosus: Secondary | ICD-10-CM | POA: Insufficient documentation

## 2011-04-15 LAB — COMPREHENSIVE METABOLIC PANEL
ALT: 20 U/L (ref 0–53)
Albumin: 3.7 g/dL (ref 3.5–5.2)
Alkaline Phosphatase: 74 U/L (ref 39–117)
BUN: 12 mg/dL (ref 6–23)
Calcium: 8.8 mg/dL (ref 8.4–10.5)
Potassium: 3.5 mEq/L (ref 3.5–5.1)
Sodium: 141 mEq/L (ref 135–145)
Total Protein: 8.1 g/dL (ref 6.0–8.3)

## 2011-04-15 LAB — CBC
Platelets: 207 10*3/uL (ref 150–400)
RBC: 4.13 MIL/uL — ABNORMAL LOW (ref 4.22–5.81)
RDW: 13.6 % (ref 11.5–15.5)
WBC: 5.7 10*3/uL (ref 4.0–10.5)

## 2011-04-15 LAB — TROPONIN I: Troponin I: <0.3 ng/mL (ref <0.30)

## 2011-07-23 ENCOUNTER — Encounter: Payer: Self-pay | Admitting: Internal Medicine

## 2011-07-23 ENCOUNTER — Emergency Department (HOSPITAL_COMMUNITY)
Admission: EM | Admit: 2011-07-23 | Discharge: 2011-07-23 | Disposition: A | Discharge status: Home / Self Care | Payer: Self-pay | Attending: Emergency Medicine | Admitting: Emergency Medicine

## 2011-07-23 ENCOUNTER — Emergency Department (HOSPITAL_COMMUNITY): Payer: Self-pay

## 2011-07-23 DIAGNOSIS — I10 Essential (primary) hypertension: Secondary | ICD-10-CM | POA: Insufficient documentation

## 2011-07-23 DIAGNOSIS — R1110 Vomiting, unspecified: Secondary | ICD-10-CM | POA: Insufficient documentation

## 2011-07-23 DIAGNOSIS — J3489 Other specified disorders of nose and nasal sinuses: Secondary | ICD-10-CM | POA: Insufficient documentation

## 2011-07-23 DIAGNOSIS — R51 Headache: Secondary | ICD-10-CM | POA: Insufficient documentation

## 2011-07-23 DIAGNOSIS — R062 Wheezing: Secondary | ICD-10-CM | POA: Insufficient documentation

## 2011-07-23 DIAGNOSIS — R05 Cough: Secondary | ICD-10-CM | POA: Insufficient documentation

## 2011-07-23 DIAGNOSIS — R059 Cough, unspecified: Secondary | ICD-10-CM | POA: Insufficient documentation

## 2011-07-23 DIAGNOSIS — R0982 Postnasal drip: Secondary | ICD-10-CM | POA: Insufficient documentation

## 2011-07-23 LAB — DIFFERENTIAL
Eosinophils Absolute: 0.2 10*3/uL (ref 0.0–0.7)
Eosinophils Relative: 3 % (ref 0–5)
Lymphocytes Relative: 24 % (ref 12–46)
Lymphs Abs: 1.3 10*3/uL (ref 0.7–4.0)
Monocytes Absolute: 0.4 10*3/uL (ref 0.1–1.0)
Monocytes Relative: 7 % (ref 3–12)

## 2011-07-23 LAB — BASIC METABOLIC PANEL
BUN: 18 mg/dL (ref 6–23)
Chloride: 104 mEq/L (ref 96–112)
Creatinine, Ser: 1.53 mg/dL — ABNORMAL HIGH (ref 0.50–1.35)
GFR calc Af Amer: >60 mL/min (ref >60)
Glucose, Bld: 97 mg/dL (ref 70–99)
Potassium: 4 mEq/L (ref 3.5–5.1)

## 2011-07-23 LAB — CBC
HCT: 36.6 % — ABNORMAL LOW (ref 39.0–52.0)
MCH: 30.5 pg (ref 26.0–34.0)
MCHC: 33.9 g/dL (ref 30.0–36.0)
MCV: 90.1 fL (ref 78.0–100.0)
Platelets: 221 10*3/uL (ref 150–400)
RDW: 13 % (ref 11.5–15.5)

## 2011-07-23 LAB — URINE MICROSCOPIC-ADD ON

## 2011-07-23 LAB — POCT I-STAT, CHEM 8
BUN: 18 mg/dL (ref 6–23)
Creatinine, Ser: 1.8 mg/dL — ABNORMAL HIGH (ref 0.50–1.35)
Glucose, Bld: 98 mg/dL (ref 70–99)
Hemoglobin: 14.3 g/dL (ref 13.0–17.0)
Potassium: 4.1 mEq/L (ref 3.5–5.1)
Sodium: 140 mEq/L (ref 135–145)

## 2011-07-23 LAB — URINALYSIS, ROUTINE W REFLEX MICROSCOPIC
Bilirubin Urine: NEGATIVE
Ketones, ur: NEGATIVE mg/dL
Nitrite: NEGATIVE
Protein, ur: >300 mg/dL — AB
Specific Gravity, Urine: 1.023 (ref 1.005–1.030)
Urobilinogen, UA: 0.2 mg/dL (ref 0.0–1.0)

## 2011-07-23 NOTE — Progress Notes (Signed)
Patient was seen in the ED for HTN with BP of 207/141 on arrival.  He c/o headache and lower back pain which resolved by the time Internal Medicine team evaluated him.  NO acute end organ damage.  He received Labetalol 10mg  IV x 2, and Amlodipine 10mg  in ED.  BP trended down to 195/129.  We reviewed and discussed labs with Dr. Aundria Rud who agreed that patient does not need admission and can follow up in our clinic for his BP control.  Of note, he has resistant HTN (MRI of abd was negative for renal artery stenosis) and was supposed to be on 5 different BP meds, however, he only takes 2 meds: Lisinopril and HCTZ but ran out about 1 month ago.  He does have chronic kidney failure (Cr on 9/12 is 1.53) 2/2 his uncontrolled HTN.  We sent him home on Lisinopril and HCTZ.  Please follow up and will need to adjust his meds appropriately.   Discussed with Dr. Aundria Rud.  [facility] R2  [employer] R1

## 2011-07-27 ENCOUNTER — Encounter: Payer: Self-pay | Admitting: *Deleted

## 2011-07-27 NOTE — Progress Notes (Signed)
Pt was offered appointment next week and he declined stating he would not make it. He was advised he doctor wanted him seen next week.  Appointment given for 9/21 at pt's request.

## 2011-08-10 ENCOUNTER — Ambulatory Visit: Payer: Self-pay | Admitting: Internal Medicine

## 2011-08-10 ENCOUNTER — Encounter: Payer: Self-pay | Admitting: Internal Medicine

## 2011-08-10 DIAGNOSIS — G43909 Migraine, unspecified, not intractable, without status migrainosus: Secondary | ICD-10-CM | POA: Insufficient documentation

## 2011-08-10 DIAGNOSIS — M351 Other overlap syndromes: Secondary | ICD-10-CM | POA: Insufficient documentation

## 2011-08-10 DIAGNOSIS — I10 Essential (primary) hypertension: Secondary | ICD-10-CM | POA: Insufficient documentation

## 2011-08-10 DIAGNOSIS — D649 Anemia, unspecified: Secondary | ICD-10-CM

## 2011-08-10 DIAGNOSIS — D631 Anemia in chronic kidney disease: Secondary | ICD-10-CM | POA: Insufficient documentation

## 2011-08-14 LAB — COMPREHENSIVE METABOLIC PANEL
AST: 86 — ABNORMAL HIGH
Albumin: 3.6
BUN: 11
Calcium: 9
Chloride: 98
Creatinine, Ser: 1.03
GFR calc Af Amer: >60
Total Protein: 8.1

## 2011-08-14 LAB — DIFFERENTIAL
Basophils Absolute: 0
Eosinophils Relative: 0
Eosinophils Relative: 0
Lymphocytes Relative: 14
Lymphocytes Relative: 15
Lymphs Abs: 1.6
Monocytes Absolute: 1.3 — ABNORMAL HIGH
Neutro Abs: 7.9 — ABNORMAL HIGH
Neutro Abs: 8.9 — ABNORMAL HIGH

## 2011-08-14 LAB — POCT CARDIAC MARKERS
CKMB, poc: 1.7
Myoglobin, poc: 94.9

## 2011-08-14 LAB — CBC
MCHC: 33.4
MCV: 91.1
Platelets: 222
Platelets: 226
RDW: 13.3
RDW: 13.7
WBC: 10.9 — ABNORMAL HIGH

## 2011-08-14 LAB — WOUND CULTURE: Gram Stain: NONE SEEN

## 2011-08-14 LAB — GC/CHLAMYDIA PROBE AMP, GENITAL
Chlamydia, DNA Probe: NEGATIVE
GC Probe Amp, Genital: NEGATIVE

## 2011-08-14 LAB — RAPID URINE DRUG SCREEN, HOSP PERFORMED
Amphetamines: NOT DETECTED
Cocaine: NOT DETECTED
Tetrahydrocannabinol: POSITIVE — AB

## 2011-08-14 LAB — BASIC METABOLIC PANEL
BUN: 10
Calcium: 9.4
GFR calc non Af Amer: >60
Glucose, Bld: 103 — ABNORMAL HIGH

## 2011-08-14 LAB — TROPONIN I
Troponin I: 0.02
Troponin I: 0.03

## 2011-08-14 LAB — D-DIMER, QUANTITATIVE: D-Dimer, Quant: 0.46

## 2011-08-14 LAB — CK TOTAL AND CKMB (NOT AT ARMC)
Relative Index: INVALID
Relative Index: INVALID
Total CK: 55

## 2011-08-14 LAB — PROTIME-INR: INR: 0.9

## 2011-08-14 LAB — LIPID PANEL
HDL: 33 — ABNORMAL LOW
LDL Cholesterol: 121 — ABNORMAL HIGH
Triglycerides: 121

## 2011-08-14 LAB — APTT: aPTT: 33

## 2011-08-14 LAB — TSH: TSH: 4.291

## 2011-08-15 LAB — BASIC METABOLIC PANEL
CO2: 27
Calcium: 9.3
Chloride: 101
Creatinine, Ser: 1.18
Glucose, Bld: 91
Sodium: 137

## 2011-08-15 LAB — DIFFERENTIAL
Basophils Absolute: 0
Basophils Relative: 0
Eosinophils Absolute: 0.2
Monocytes Absolute: 0.6
Monocytes Relative: 6
Neutro Abs: 6.8

## 2011-08-15 LAB — CBC
Hemoglobin: 13.2
MCHC: 34.2
MCV: 90.4
RBC: 4.28
RDW: 13.2

## 2011-08-17 LAB — URINALYSIS, ROUTINE W REFLEX MICROSCOPIC
Glucose, UA: NEGATIVE
Leukocytes, UA: NEGATIVE
Protein, ur: 30 — AB
Specific Gravity, Urine: 1.029
Urobilinogen, UA: 1

## 2011-08-17 LAB — POCT I-STAT, CHEM 8
Calcium, Ion: 1.16
Glucose, Bld: 85
HCT: 39
Hemoglobin: 13.3
Potassium: 4.5

## 2011-08-17 LAB — URINE MICROSCOPIC-ADD ON

## 2011-09-04 LAB — DIFFERENTIAL
Basophils Absolute: 0
Basophils Relative: 0
Eosinophils Absolute: 0.1
Eosinophils Relative: 1

## 2011-09-04 LAB — CBC
HCT: 40.9
MCHC: 33.8
MCV: 90.1
Platelets: 253
RDW: 13.2

## 2011-09-04 LAB — BASIC METABOLIC PANEL
BUN: 15
Creatinine, Ser: 1.31
GFR calc Af Amer: >60
GFR calc non Af Amer: >60

## 2011-09-04 LAB — POCT CARDIAC MARKERS: Operator id: 4761

## 2012-01-21 ENCOUNTER — Encounter (HOSPITAL_COMMUNITY): Payer: Self-pay | Admitting: Emergency Medicine

## 2012-01-21 ENCOUNTER — Inpatient Hospital Stay (HOSPITAL_COMMUNITY)
Admission: EM | Admit: 2012-01-21 | Discharge: 2012-01-22 | DRG: 683 | Disposition: A | Discharge status: Home / Self Care | Payer: No Typology Code available for payment source | Source: Ambulatory Visit | Attending: Family Medicine | Admitting: Family Medicine

## 2012-01-21 DIAGNOSIS — I129 Hypertensive chronic kidney disease with stage 1 through stage 4 chronic kidney disease, or unspecified chronic kidney disease: Principal | ICD-10-CM | POA: Diagnosis present

## 2012-01-21 DIAGNOSIS — N189 Chronic kidney disease, unspecified: Secondary | ICD-10-CM | POA: Diagnosis present

## 2012-01-21 DIAGNOSIS — Z91199 Patient's noncompliance with other medical treatment and regimen due to unspecified reason: Secondary | ICD-10-CM

## 2012-01-21 DIAGNOSIS — M3589 Other specified systemic involvement of connective tissue: Secondary | ICD-10-CM | POA: Diagnosis present

## 2012-01-21 DIAGNOSIS — D649 Anemia, unspecified: Secondary | ICD-10-CM

## 2012-01-21 DIAGNOSIS — G43109 Migraine with aura, not intractable, without status migrainosus: Secondary | ICD-10-CM

## 2012-01-21 DIAGNOSIS — Z9119 Patient's noncompliance with other medical treatment and regimen: Secondary | ICD-10-CM

## 2012-01-21 DIAGNOSIS — I10 Essential (primary) hypertension: Secondary | ICD-10-CM

## 2012-01-21 DIAGNOSIS — N179 Acute kidney failure, unspecified: Secondary | ICD-10-CM | POA: Diagnosis present

## 2012-01-21 DIAGNOSIS — N289 Disorder of kidney and ureter, unspecified: Secondary | ICD-10-CM

## 2012-01-21 DIAGNOSIS — G43909 Migraine, unspecified, not intractable, without status migrainosus: Secondary | ICD-10-CM | POA: Diagnosis present

## 2012-01-21 DIAGNOSIS — M358 Other specified systemic involvement of connective tissue: Secondary | ICD-10-CM | POA: Diagnosis present

## 2012-01-21 HISTORY — DX: Essential (primary) hypertension: I10

## 2012-01-21 LAB — BASIC METABOLIC PANEL
CO2: 27 mEq/L (ref 19–32)
Calcium: 9.3 mg/dL (ref 8.4–10.5)
GFR calc non Af Amer: 34 mL/min — ABNORMAL LOW (ref >90)
Sodium: 137 mEq/L (ref 135–145)

## 2012-01-21 LAB — DIFFERENTIAL
Basophils Absolute: 0 10*3/uL (ref 0.0–0.1)
Eosinophils Relative: 2 % (ref 0–5)
Lymphocytes Relative: 30 % (ref 12–46)
Neutro Abs: 2.8 10*3/uL (ref 1.7–7.7)

## 2012-01-21 LAB — CBC
MCV: 90.1 fL (ref 78.0–100.0)
Platelets: 147 10*3/uL — ABNORMAL LOW (ref 150–400)
RDW: 13.9 % (ref 11.5–15.5)
WBC: 4.9 10*3/uL (ref 4.0–10.5)

## 2012-01-21 MED ORDER — LISINOPRIL 20 MG PO TABS
20.0000 mg | ORAL_TABLET | Freq: Once | ORAL | Status: AC
  Administered 2012-01-21: 20 mg via ORAL
  Filled 2012-01-21: qty 1

## 2012-01-21 MED ORDER — HEPARIN SODIUM (PORCINE) 5000 UNIT/ML IJ SOLN
5000.0000 [IU] | Freq: Three times a day (TID) | INTRAMUSCULAR | Status: DC
  Administered 2012-01-22 (×2): 5000 [IU] via SUBCUTANEOUS
  Filled 2012-01-21 (×4): qty 1

## 2012-01-21 MED ORDER — METOCLOPRAMIDE HCL 5 MG/ML IJ SOLN
10.0000 mg | Freq: Once | INTRAMUSCULAR | Status: AC
  Administered 2012-01-21: 10 mg via INTRAVENOUS
  Filled 2012-01-21: qty 2

## 2012-01-21 MED ORDER — SODIUM CHLORIDE 0.9 % IJ SOLN
3.0000 mL | Freq: Two times a day (BID) | INTRAMUSCULAR | Status: DC
  Administered 2012-01-21: 3 mL via INTRAVENOUS

## 2012-01-21 MED ORDER — METOPROLOL TARTRATE 1 MG/ML IV SOLN
5.0000 mg | INTRAVENOUS | Status: DC | PRN
  Administered 2012-01-22: 5 mg via INTRAVENOUS
  Filled 2012-01-21: qty 5

## 2012-01-21 MED ORDER — ACETAMINOPHEN 650 MG RE SUPP: 650.0000 mg | Freq: Four times a day (QID) | RECTAL | Status: DC | PRN

## 2012-01-21 MED ORDER — SODIUM CHLORIDE 0.9 % IJ SOLN: 3.0000 mL | INTRAMUSCULAR | Status: DC | PRN

## 2012-01-21 MED ORDER — AMLODIPINE BESYLATE 10 MG PO TABS
10.0000 mg | ORAL_TABLET | Freq: Every day | ORAL | Status: DC
  Administered 2012-01-22: 10 mg via ORAL
  Filled 2012-01-21: qty 1

## 2012-01-21 MED ORDER — ACETAMINOPHEN 325 MG PO TABS
650.0000 mg | ORAL_TABLET | Freq: Once | ORAL | Status: AC
  Administered 2012-01-21: 650 mg via ORAL
  Filled 2012-01-21: qty 2

## 2012-01-21 MED ORDER — NICARDIPINE HCL IN NACL 20-0.86 MG/200ML-% IV SOLN
5.0000 mg/h | Freq: Once | INTRAVENOUS | Status: AC
  Administered 2012-01-21: 5 mg/h via INTRAVENOUS
  Filled 2012-01-21: qty 200

## 2012-01-21 MED ORDER — HYDROCHLOROTHIAZIDE 25 MG PO TABS: 25.0000 mg | ORAL_TABLET | Freq: Every day | ORAL | Status: DC

## 2012-01-21 MED ORDER — HYDROCHLOROTHIAZIDE 25 MG PO TABS
25.0000 mg | ORAL_TABLET | Freq: Every day | ORAL | Status: DC
  Administered 2012-01-22: 25 mg via ORAL
  Filled 2012-01-21: qty 1

## 2012-01-21 MED ORDER — ACETAMINOPHEN 325 MG PO TABS
650.0000 mg | ORAL_TABLET | Freq: Four times a day (QID) | ORAL | Status: DC | PRN
  Administered 2012-01-22 (×2): 650 mg via ORAL
  Filled 2012-01-21 (×3): qty 2

## 2012-01-21 MED ORDER — SODIUM CHLORIDE 0.9 % IV SOLN: 250.0000 mL | INTRAVENOUS | Status: DC | PRN

## 2012-01-21 MED ORDER — HYDROCHLOROTHIAZIDE 25 MG PO TABS
25.0000 mg | ORAL_TABLET | Freq: Once | ORAL | Status: AC
  Administered 2012-01-21: 25 mg via ORAL
  Filled 2012-01-21: qty 1

## 2012-01-21 MED ORDER — LABETALOL HCL 5 MG/ML IV SOLN
5.0000 mg | Freq: Once | INTRAVENOUS | Status: AC
  Administered 2012-01-21: 23:00:00 via INTRAVENOUS
  Filled 2012-01-21 (×2): qty 4

## 2012-01-21 MED ORDER — SODIUM CHLORIDE 0.9 % IJ SOLN
3.0000 mL | Freq: Two times a day (BID) | INTRAMUSCULAR | Status: DC
  Administered 2012-01-22: 3 mL via INTRAVENOUS

## 2012-01-21 MED ORDER — LISINOPRIL-HYDROCHLOROTHIAZIDE 20-25 MG PO TABS: 1.0000 | ORAL_TABLET | Freq: Every day | ORAL | Status: DC

## 2012-01-21 NOTE — ED Notes (Signed)
PT. REPORTS ELEVATED BLOOD PRESSURE FOR SEVERAL MONTHS , STATES DID NOT TAKE HIS HYPERTENSIVE MEDICATIONS FOR 3 MONTH S, ALSO REPORT S LEFT LOW BACK PAIN AND HEADACHE.

## 2012-01-21 NOTE — ED Notes (Signed)
Order clarification labetalol 5 mg 1 time order per EDP pt to floor

## 2012-01-21 NOTE — ED Notes (Signed)
Pharmacy called r/t labetalol gtt will send ASAP

## 2012-01-21 NOTE — ED Notes (Signed)
P t request that meds need to be on the $4 list

## 2012-01-21 NOTE — ED Provider Notes (Signed)
History     CSN: [id number]  Arrival date & time 01/21/12  1507   First MD Initiated Contact with Patient 01/21/12 1714      Chief Complaint  Patient presents with  . Hypertension    (Consider location/radiation/quality/duration/timing/severity/associated sxs/prior treatment) HPI Hx provided by the pt.  36 year old male with known hypertension presenting with complaint of hypertension and headache. Patient states he was diagnosed with hypertension about one year ago by an emergency physician and was started on amlodipine and lisinopril at that time. Patient has not taken his medication since the end of January (about one half months ago) secondary to inability to afford these.  Patient also states he does not have a PCP. Patient reports severe (7/10 in severity) for headache which is described as throbbing, intermittent with about 2 episodes per day since stopping it pressure medication. Headaches are gradual in onset and today's headache is very similar to his prior episodes. Patient generally takes Excedrin Migraine for these headaches with some relief; however, today patient's headache was essentially unresolved with this medication. No associated numbness, weakness, tingling, abnormal gait, difficulty swallowing,  chest pain, or shortness of breath. Patient has not been seen recently by a physician for this complaint.   Past Medical History  Diagnosis Date  . Hypertension     History reviewed. No pertinent past surgical history.  No family history on file.  History  Substance Use Topics  . Smoking status: Never Smoker   . Smokeless tobacco: Not on file  . Alcohol Use: No      Review of Systems  Constitutional: Negative for fever and chills.  HENT: Negative for congestion, sore throat, rhinorrhea, neck pain and neck stiffness.   Eyes: Negative for pain and visual disturbance.  Respiratory: Negative for cough and shortness of breath.   Cardiovascular: Negative for chest  pain and palpitations.  Gastrointestinal: Negative for nausea, vomiting, abdominal pain and diarrhea.  Genitourinary: Negative for dysuria and hematuria.  Musculoskeletal: Positive for back pain (Pt reports about 2-3 days of left lower back pain/soreness, which is worse with movement and thought 2/2 his job, as he works for a [employer] and frequently lifts heavy packages.). Negative for gait problem.  Skin: Negative for rash and wound.  Neurological: Positive for headaches. Negative for dizziness, syncope, facial asymmetry, weakness and numbness.  Psychiatric/Behavioral: Negative for confusion and agitation.  All other systems reviewed and are negative.    Allergies  Amoxicillin  Home Medications   Current Outpatient Rx  Name Route Sig Dispense Refill  . OVER THE COUNTER MEDICATION Oral Take 2 capsules by mouth 2 (two) times daily as needed. [employer] Extra Strength Headache Relief with acetaminophen and aspirin (for headaches)      BP 235/157  Pulse 84  Temp(Src) 97.8 F (36.6 C) (Oral)  Resp 15  SpO2 99%  Physical Exam  Nursing note and vitals reviewed. Constitutional: He is oriented to person, place, and time. He appears well-developed and well-nourished. No distress.  HENT:  Head: Normocephalic and atraumatic.  Right Ear: External ear normal.  Left Ear: External ear normal.  Nose: Nose normal.  Mouth/Throat: Oropharynx is clear and moist.  Eyes: Conjunctivae and EOM are normal. Pupils are equal, round, and reactive to light.  Neck: Normal range of motion. Neck supple.  Cardiovascular: Normal rate, regular rhythm and intact distal pulses.   No murmur heard. Pulmonary/Chest: Effort normal and breath sounds normal. No respiratory distress.  Abdominal: Soft. Bowel sounds are normal. He exhibits  no distension. There is no tenderness.  Musculoskeletal: Normal range of motion. He exhibits no edema.       Back:  Neurological: He is alert and oriented to person,  place, and time. He has normal strength. No cranial nerve deficit or sensory deficit. Coordination and gait normal. GCS eye subscore is 4. GCS verbal subscore is 5. GCS motor subscore is 6.  Skin: Skin is dry. No rash noted. He is not diaphoretic.  Psychiatric: He has a normal mood and affect. Judgment normal.    ED Course  Procedures (including critical care time)   Date: 01/21/2012  Rate: 72  Rhythm: normal sinus rhythm  QRS Axis: normal  Intervals: normal  ST/T Wave abnormalities: TWI in inferolateral leads with ST elevation in V3/4 likely 2/2 LVH, both similar to prior EKG on 04/15/11    Labs Reviewed  CBC - Abnormal; Notable for the following:    RBC 3.75 (*)    Hemoglobin 11.0 (*)    HCT 33.8 (*)    Platelets 147 (*)    All other components within normal limits  BASIC METABOLIC PANEL - Abnormal; Notable for the following:    Creatinine, Ser 2.33 (*)    GFR calc non Af Amer 34 (*)    GFR calc Af Amer 40 (*)    All other components within normal limits  DIFFERENTIAL   No results found.   1. Uncontrolled hypertension   2. Renal insufficiency       MDM  36 y.o. M with poorly controlled HTN, off meds x ~1.5 months presenting with typical diffuse HA of gradual onset, similar to daily HAs thought 2/2 HTN.  No neuro Sx's and pt with a nonfocal neuro exam.  Also with Lt mid to low back pain with clinical picture most c/w MSK Et.  Sig HTN to 250s/110s.  Pt initially given PO meds (those planned for outpt use) of lisinopril and HCTZ; however, labs with renal insufficiency of 2.33 without abnl lytes; last creat also abnl but 1.8 in 07/2011; unknown when injury occurred.  Nicardipine ordered and FPM consulted.    Will d/c gtt and order labetalol 2/2 improved pressure to goal (now at ~180s/110s).  Mild returned HA; tylenol given.  Pt admitted.        Particia Lather, MD 01/22/12 [phone]

## 2012-01-21 NOTE — ED Notes (Signed)
Pt was received to RM 4 with c/o severe headache onset this morning characterized as pounding. Pt has a hx of HTN and has not taken his medication since January. Pt claimed that he does not have an insurance and is not able to afford his medicine. Pt is attached to the monitor.

## 2012-01-21 NOTE — ED Notes (Signed)
Assumed pt care,  Pt calm, denies any pain or discomforts, BP high and pt started on Cardene gtt as ordered. Call light at reach.  POC  Discussed with pt, educated pt on the important of taking his BP medications.

## 2012-01-21 NOTE — ED Notes (Signed)
[facility] community liason met with pt and set him up with an eligibility appt.

## 2012-01-21 NOTE — ED Notes (Signed)
Waiting for labetalol from pharmacy, before pt can go upstair.

## 2012-01-21 NOTE — ED Notes (Addendum)
Report called to [address] RN Devonne Doughty, stated they are cleaning the room now. Will call when room is ready.

## 2012-01-21 NOTE — ED Notes (Signed)
Pt moved from room 4 to room 6,  Cont monitoring. Snack and drink given to pt.

## 2012-01-21 NOTE — ED Notes (Signed)
Second call to pharmacy r/t labetalol gtt states will send ASAP

## 2012-01-21 NOTE — ED Notes (Signed)
Pt claimed that his headache is much better, will continue to monitor

## 2012-01-21 NOTE — ED Provider Notes (Signed)
I saw and evaluated the patient, reviewed the resident's note and I agree with the findings and plan including ECG interpretation.  This 36 year old male ran out of his blood pressure medicines approximately a month and a half ago and notes that when he does not take his blood pressure medicines he has gradual onset headaches multiple times over the last several weeks like he has had in the last several months that last several hours at a time usually her improved with over-the-counter analgesics. Today he has a typical headache for him with gradual onset pounding throbbing headache globally without focal neurologic symptoms and no sudden onset headache. Since his headache did not go away with over-the-counter analgesics he came to the ED for evaluation so he can get back on his blood pressure medications.  Due to acute renal insufficiency treat as HTN crisis. CRITICAL CARE Performed by: Hurman Horn   Total critical care time:  Critical care time was exclusive of separately billable procedures and treating other patients.  Critical care was necessary to treat or prevent imminent or life-threatening deterioration.  Critical care was time spent personally by me on the following activities: development of treatment plan with patient and/or surrogate as well as nursing, discussions with consultants, evaluation of patient's response to treatment, examination of patient, obtaining history from patient or surrogate, ordering and performing treatments and interventions, ordering and review of laboratory studies, ordering and review of radiographic studies, pulse oximetry and re-evaluation of patient's condition.  Hurman Horn, MD 01/22/12 [phone]

## 2012-01-21 NOTE — H&P (Signed)
Family Medicine Teaching [facility] Admission History and Physical  Patient name: Matthew Beard Medical record number: [id number] Date of birth: 1976-05-19 Age: 36 y.o. Gender: male  Primary Care Provider: Sheila Oats, MD, MD  Chief Complaint: Hypertensive urgency  History of Present Illness: Matthew Beard is a 36 y.o. year old male presenting with hypertensive urgency. He has a history of hypertension times several years. He does not have a primary care provider and get his medicines from the ED. He has been on lisinopril, HCTZ, amlodipine in the past but has been out of them since January. He has a strong family history of hypertension with both his mother and father dying of strokes from hypertension at ages 72 for his father important for his mother. He does not have any family history of diabetes or heart disease.  Patient notes that today he had a headache that was like his previous headaches but would not go with Excedrin. He often gets headaches and his blood pressure is up and out of hotels and he needs to the ED for evaluation. He has not had any vision changes, shortness of breath, chest pain, weakness. He denies any changes in his urination.  Review Of Systems: Per HPI with the following additions: Denies changes in bowel or bladder Otherwise 12 point review of systems was performed and was unremarkable.  Patient Active Problem List  Diagnoses  . Uncontrolled hypertension  . Anemia  . Mixed connective tissue disease  . Migraine    Past Medical History: Past Medical History  Diagnosis Date  . Hypertension     Past Surgical History: History reviewed. No pertinent past surgical history. Plate in jaw 10 years ago  Social History: History   Social History  . Marital Status: Single    Spouse Name: N/A    Number of Children: N/A  . Years of Education: N/A   Social History Main Topics  . Smoking status: Never Smoker   . Smokeless tobacco: None  . Alcohol  Use: No  . Drug Use: No  . Sexually Active:    Other Topics Concern  . None   Social History Narrative  . None    Family History: Family History  Problem Relation Age of Onset  . Hypertension Mother   . Hypertension Father   . Stroke Mother   . Stroke Father     Allergies: Allergies  Allergen Reactions  . Amoxicillin Hives    Current Facility-Administered Medications  Medication Dose Route Frequency Provider Last Rate Last Dose  . hydrochlorothiazide (HYDRODIURIL) tablet 25 mg  25 mg Oral Once Matthew Horn, MD   25 mg at 01/21/12 1807  . labetalol (NORMODYNE,TRANDATE) injection 5 mg  5 mg Intravenous Once Matthew Coker, MD      . lisinopril (PRINIVIL,ZESTRIL) tablet 20 mg  20 mg Oral Once Matthew Lather, MD   20 mg at 01/21/12 1806  . metoCLOPramide (REGLAN) injection 10 mg  10 mg Intravenous Once Matthew Lather, MD   10 mg at 01/21/12 1952  . niCARdipine (CARDENE-IV) infusion (0.1 mg/ml)  5 mg/hr Intravenous Once Matthew Lather, MD 50 mL/hr at 01/21/12 1926 5 mg/hr at 01/21/12 1926  . DISCONTD: hydrochlorothiazide (HYDRODIURIL) tablet 25 mg  25 mg Oral Daily Matthew Lather, MD       Current Outpatient Prescriptions  Medication Sig Dispense Refill  . OVER THE COUNTER MEDICATION Take 2 capsules by mouth 2 (two) times daily as needed. [employer] Extra Strength Headache Relief with acetaminophen and  aspirin (for headaches)         Physical Exam: BP 162/98  Pulse 95  Temp(Src) 97.2 F (36.2 C) (Oral)  Resp 18  SpO2 99%            General: alert, cooperative and no distress HEENT: PERRLA, extra ocular movement intact, sclera clear, anicteric and retina exam showed no cupping Heart: S1, S2 normal, no murmur, rub or gallop, regular rate and rhythm Lungs: clear to auscultation, no wheezes or rales and unlabored breathing Abdomen: abdomen is soft without significant tenderness, masses, organomegaly or guarding Extremities: extremities normal, atraumatic, no cyanosis or  edema Musculoskeletal: no joint tenderness, deformity or swelling Skin:no rashes Neurology: normal without focal findings, mental status, speech normal, alert and oriented x3, PERLA, fundi are normal, cranial nerves 2-12 intact, muscle tone and strength normal and symmetric, sensation grossly normal, finger to nose and cerebellar exam normal and no tremors, cogwheeling or rigidity noted  Labs and Imaging: Lab Results  Component Value Date/Time   NA 137 01/21/2012  4:59 PM   K 4.0 01/21/2012  4:59 PM   CL 101 01/21/2012  4:59 PM   CO2 27 01/21/2012  4:59 PM   BUN 21 01/21/2012  4:59 PM   CREATININE 2.33* 01/21/2012  4:59 PM   GLUCOSE 88 01/21/2012  4:59 PM   Lab Results  Component Value Date   WBC 4.9 01/21/2012   HGB 11.0* 01/21/2012   HCT 33.8* 01/21/2012   MCV 90.1 01/21/2012   PLT 147* 01/21/2012       Assessment and Plan: Matthew Beard is a 36 y.o. year old male presenting with hypertensive urgency. 1. HTN- initial BP was 230s/150s.  Goal is 200/115 for now to avoid dropping him too fast.  The ED restarted home meds of lisinopril, HCTZ and amlodipine.  They also placed him on a nicardipine drip.  He will not be able to go to step down on a drip, so we will change him to IV metop for BPs over 200/115.  We will hold lisinopril until we have evaluated his kidney function closer.  Plan to risk stratify with AM lipid, TSH, and A1C. 2.  Acute on chronic kidney injury- likely 2/2 uncontrolled HTN.  Will hold lisinopril and obtain FeNa and UA.  Consider renal ultrasound.   2. FEN/GI: saline lock IV.  Heart healthy diet. 3. Prophylaxis:heparin SQ TID 4. Disposition: pending stabilization of BP below 200/115.  Plan to have pt follow up at the [facility] to establish care.     SignedEllery Plunk, MD Family Medicine Resident PGY-3 01/21/2012 9:53 PM  [phone]

## 2012-01-22 ENCOUNTER — Encounter: Payer: Self-pay | Admitting: Family Medicine

## 2012-01-22 LAB — BASIC METABOLIC PANEL
BUN: 21 mg/dL (ref 6–23)
CO2: 28 mEq/L (ref 19–32)
Chloride: 102 mEq/L (ref 96–112)
Creatinine, Ser: 2.21 mg/dL — ABNORMAL HIGH (ref 0.50–1.35)

## 2012-01-22 LAB — CBC
HCT: 33.4 % — ABNORMAL LOW (ref 39.0–52.0)
MCH: 29.6 pg (ref 26.0–34.0)
MCHC: 33.7 g/dL (ref 30.0–36.0)
MCV: 87.7 fL (ref 78.0–100.0)
MCV: 88.6 fL (ref 78.0–100.0)
Platelets: 151 10*3/uL (ref 150–400)
RDW: 13.7 % (ref 11.5–15.5)
RDW: 13.9 % (ref 11.5–15.5)
WBC: 4.7 10*3/uL (ref 4.0–10.5)

## 2012-01-22 LAB — URINALYSIS, ROUTINE W REFLEX MICROSCOPIC
Bilirubin Urine: NEGATIVE
Glucose, UA: NEGATIVE mg/dL
Ketones, ur: NEGATIVE mg/dL
Leukocytes, UA: NEGATIVE
pH: 6.5 (ref 5.0–8.0)

## 2012-01-22 LAB — CARDIAC PANEL(CRET KIN+CKTOT+MB+TROPI)
CK, MB: 2.9 ng/mL (ref 0.3–4.0)
Relative Index: 3 — ABNORMAL HIGH (ref 0.0–2.5)
Total CK: 117 U/L (ref 7–232)
Troponin I: <0.3 ng/mL (ref <0.30)
Troponin I: <0.3 ng/mL (ref <0.30)
Troponin I: <0.3 ng/mL (ref <0.30)

## 2012-01-22 LAB — URINE MICROSCOPIC-ADD ON

## 2012-01-22 LAB — TSH: TSH: 4.482 u[IU]/mL (ref 0.350–4.500)

## 2012-01-22 LAB — LIPID PANEL: LDL Cholesterol: 124 mg/dL — ABNORMAL HIGH (ref 0–99)

## 2012-01-22 LAB — HEMOGLOBIN A1C: Hgb A1c MFr Bld: 5.7 % — ABNORMAL HIGH (ref <5.7)

## 2012-01-22 MED ORDER — HYDROCHLOROTHIAZIDE 25 MG PO TABS: 25.0000 mg | ORAL_TABLET | Freq: Every day | ORAL | Status: DC

## 2012-01-22 MED ORDER — METOPROLOL TARTRATE 25 MG PO TABS: 25.0000 mg | ORAL_TABLET | Freq: Two times a day (BID) | ORAL | Status: DC

## 2012-01-22 MED ORDER — AMLODIPINE BESYLATE 10 MG PO TABS: 10.0000 mg | ORAL_TABLET | Freq: Every day | ORAL | Status: DC

## 2012-01-22 MED ORDER — ACETAMINOPHEN 325 MG PO TABS
650.0000 mg | ORAL_TABLET | Freq: Once | ORAL | Status: AC
  Administered 2012-01-22: 650 mg via ORAL

## 2012-01-22 MED ORDER — POTASSIUM CHLORIDE CRYS ER 20 MEQ PO TBCR
40.0000 meq | EXTENDED_RELEASE_TABLET | Freq: Once | ORAL | Status: AC
  Administered 2012-01-22: 40 meq via ORAL
  Filled 2012-01-22: qty 2

## 2012-01-22 MED ORDER — HYDRALAZINE HCL 20 MG/ML IJ SOLN
10.0000 mg | Freq: Once | INTRAMUSCULAR | Status: AC
  Administered 2012-01-22: 17:00:00 via INTRAVENOUS
  Filled 2012-01-22: qty 1

## 2012-01-22 MED ORDER — ACETAMINOPHEN 325 MG PO TABS: 650.0000 mg | ORAL_TABLET | Freq: Four times a day (QID) | ORAL | Status: DC | PRN

## 2012-01-22 NOTE — Progress Notes (Signed)
BP 172/106, CLAIMED TO FEEL MUCH BETTER, MD TALKED TO PT. AND CLAIMED THAT HE COULD BE DISCHARGED HOME.

## 2012-01-22 NOTE — Progress Notes (Signed)
DISCHARGED HOME ACCOMPANIED BY FRIEND, STABLE, DISCHARGE INSTRUCTIONS GIVEN TO PT. BELONGINGS WITH PT.

## 2012-01-22 NOTE — Progress Notes (Signed)
Complained of headache scale of 4, bp 182/113 right arm, bp- 171/124 left arm, md aware and seen pt at once.  Hydralazine iv  And tylenol po given. Continue to monitor.

## 2012-01-22 NOTE — Discharge Instructions (Signed)
Your BP was dangerously high and required IV medications initially to bring your pressures down.  We have been doing well since starting 2 BP medications by mouth.  We would like to get your BP down slowly over the next couple of months.  It is important that you have close follow up and make your appointments for your BP checks and medication adjustments.  Your kidneys have some long standing damage from your high blood pressure and we are concerned that they may continue to decline if we do not help you get better control of your BP.    I have scheduled an appointment in our Clinic which is across from the urgent care center (next to the new main entrance on [address]).  It is important to make this appointment.

## 2012-01-22 NOTE — H&P (Signed)
Seen and examined.  Discussed with Dr. Hulen Luster.  Agree with her documentation and management. Briefly 36 yo male once again admitted with markedly elevated BPs.  This time also had headache. Issues: 1. Hypertensive emergency.  The combination of his high numbers BP=240/140 and headache meets diagnostic criteria for hypertensive crisis.  Team has prudently lowered his BP moderately.  Our goal should be to bring his pressures down to normal over next few days. 2. Acute on chronic renal failure.  Certainly related to problem #1.  Treat BP 3. Medical non compliance.  States cannot afford meds and has sought much of his care from the ER.  We will set up for follow up with FM.  Also he needs to go through qualification process with Rudell Cobb to obtain orange card.  He states he can afford $4 meds so we should focus on formulary and generic prescribing.  The consistent message we need to give him is that we will do our part, he needs to do his part.   4. Dispo: to floor today.  Recheck creat/BMP in am.  Possible DC tomorrow with close outpatient follow up.

## 2012-01-22 NOTE — Discharge Summary (Signed)
Family Medicine Teaching Service DISCHARGE SUMMARY   Patient name: Matthew Beard Medical record number: [id number] Date of birth: 01-17-1976 Age: 36 y.o. Gender: male  Attending Physician: Dr. Doralee Albino Primary Care Provider: Gentry Fitz Consultants: None  Dates of Hospitalization:  01/21/2012 to 01/25/2012 Length of Stay: 1 days  Admission Diagnoses:  Hypertensive Urgency  Discharge Diagnoses:  Principal Problem:  *Uncontrolled hypertension  Patient Active Problem List  Diagnoses Date Noted  . Uncontrolled hypertension 08/10/2011  . Anemia 08/10/2011  . Mixed connective tissue disease 08/10/2011  . Migraine 08/10/2011    Brief Hospital Course   Jamell AYANSH FEUTZ is a 36 y.o. year old male who presented with hypertensive urgency with BP in the ED to 235/157 and reporting severe headache.  He was started on a Cardene drip but was transitioned off to amlodipine, hctz, metoprolol without difficulty.  His BP was ~ 160/120 prior to d/c which was >than 20% reduction from admission.  He was asymptomatic and doing well prior to d/c with resolution of his headache.    During this hospitalization he was found to have an elevated creatinine and the patient was surprised to hear that he has some kidney disease due to his hypertension.  He also reported that both his parents had died ~50 due to stroke and that he wanted to get better control of his disease.  Pt has been non-compliant in the past with no PCP.  Pt reports that due to renal involvement at this time he is interested in trying to get better control of his symptoms.    Significant Diagnostics:  LABS: Hematology:  Lab 01/22/12 0515 01/22/12 0018 01/21/12 1659  WBC 4.7 6.1 4.9  HGB 11.0* 11.3* 11.0*  HCT 33.4* 33.5* 33.8*  PLT 159 151 147*  RDW 13.9 13.7 13.9  MCV 88.6 87.7 90.1  MCHC 32.9 33.7 32.5    Lab 01/21/12 1659  LYMPHSABS 1.5  EOSABS 0.1  BASOSABS 0.0   Metabolic:  Lab 01/22/12 0515 01/22/12 0018 01/21/12  1659  NA 138 -- 137  K 3.3* -- 4.0  CL 102 -- 101  CO2 28 -- 27  BUN 21 -- 21  CREATININE 2.21* 2.21* 2.33*  GLUCOSE 113* -- 88  CALCIUM 9.0 -- 9.3  MG -- -- --  PHOS -- -- --    Lab 01/22/12 0515  CHOL 187  TRIG 125  HDL 38*    Lab 01/22/12 0515  TSH 4.482  T3FREE --  FREET4 --    Lab 01/22/12 0515  HGBA1C 5.7*   Other Labs:  Lab 01/22/12 1710 01/22/12 0958 01/22/12 0157  CKTOTAL 117 104 122  TROPONINI <0.30 <0.30 <0.30  TROPONINT -- -- --  CKMBINDEX -- -- --   MICRO: NONE  IMAGING: NONE Procedures:   NONE  Discharge Vitals and Exam:  Vitals: Filed Vitals:   01/22/12 1649 01/22/12 1730 01/22/12 1734 01/22/12 1800  BP: 171/124 160/121 160/121 172/106  Pulse:      Temp:      TempSrc:      Resp:   18 17  Height:      Weight:      SpO2:   98%    PE: GENERAL:  Adult AA Male examined in [facility] 2900.  In NAD, no resp distress HNEENT: AT/[place], MMM, no scleral icterus, EOMi THORAX: HEART: RRR, S1/S2 heard, no murmur LUNGS: CTA B, no wheezes, no crackles ABDOMEN:  +BS, soft, non-tender, no rigidity, no guarding, no masses/organomegaly; no renal or inguinal bruits  EXTREMITIES: Moves all 4 extremities spontaneously, warm well perfused, no edema, bilateral DP and PT pulses 2+/4.     Discharge Information   Disposition: 01-Home or Self Care Discharge Diet: Resume diet Discharge Condition:  Improved Discharge Activity: Ad lib Medication List  As of 01/25/2012  6:14 PM   TAKE these medications         amLODipine 10 MG tablet   Commonly known as: NORVASC   Take 1 tablet (10 mg total) by mouth daily.      hydrochlorothiazide 25 MG tablet   Commonly known as: HYDRODIURIL   Take 1 tablet (25 mg total) by mouth daily.      metoprolol tartrate 25 MG tablet   Commonly known as: LOPRESSOR   Take 1 tablet (25 mg total) by mouth 2 (two) times daily.      OVER THE COUNTER MEDICATION   Take 2 capsules by mouth 2 (two) times daily as needed. [employer] Extra  Strength Headache Relief with acetaminophen and aspirin (for headaches)            Follow Up Issues and Recommendations:    Discharge Orders    Future Appointments: Provider: Department: Dept Phone: Center:   02/22/2012 2:00 PM Andrena Mews, DO [facility] Resident [phone] [facility]     Future Orders Please Complete By Expires   Diet - low sodium heart healthy      Increase activity slowly        Follow-up Information    Follow up with Earnest Thalman, DO on 01/29/2012. (300 PM )    Contact information:   [address] [place] [address] [phone]         Pending Results: none   Issues and Medications to address: BP Follow up & medication titration; consider workup for secondary causes especially given family history Renal Monitoring  Andrena Mews, DO [facility] Resident - PGY-1 01/25/2012 6:14 PM

## 2012-01-22 NOTE — Progress Notes (Signed)
MD notified of patients SBP 160's/110's. Said okay to give PRN metoprolol x1  even though parameters not met. Will continue to monitor.

## 2012-01-26 NOTE — Discharge Summary (Signed)
Family Medicine Teaching Service  Discharge Note : Attending Lesleigh Hughson MD Pager [phone] Office [phone] I have seen and examined this patient, reviewed their chart and discussed discharge planning wit the resident at the time of discharge. I agree with the discharge plan as above.  

## 2012-01-29 ENCOUNTER — Ambulatory Visit: Payer: Self-pay | Admitting: Sports Medicine

## 2012-02-22 ENCOUNTER — Ambulatory Visit (INDEPENDENT_AMBULATORY_CARE_PROVIDER_SITE_OTHER): Payer: No Typology Code available for payment source | Admitting: Sports Medicine

## 2012-02-22 ENCOUNTER — Encounter: Payer: Self-pay | Admitting: Sports Medicine

## 2012-02-22 VITALS — BP 192/112 | HR 88 | Ht 68.0 in | Wt 175.0 lb

## 2012-02-22 DIAGNOSIS — I10 Essential (primary) hypertension: Secondary | ICD-10-CM

## 2012-02-22 DIAGNOSIS — M358 Other specified systemic involvement of connective tissue: Secondary | ICD-10-CM

## 2012-02-22 DIAGNOSIS — M351 Other overlap syndromes: Secondary | ICD-10-CM

## 2012-02-22 DIAGNOSIS — N189 Chronic kidney disease, unspecified: Secondary | ICD-10-CM

## 2012-02-22 MED ORDER — METOPROLOL TARTRATE 50 MG PO TABS: 50.0000 mg | ORAL_TABLET | Freq: Two times a day (BID) | ORAL | Status: DC

## 2012-02-22 NOTE — Assessment & Plan Note (Signed)
Will recheck creatinine today.  Consider addition of lisinopril (in combination pill of hydrochlorothiazide) if blood pressure remains elevated and renal function will tolerate.

## 2012-02-22 NOTE — Progress Notes (Signed)
HPI:  Matthew Beard is a 36 y.o. male presenting today for hospital follow up for hypertensive emergency/urgency.  He reports that he has been compliant with his medications with the exception of taking 50 mg metoprolol daily instead of 25 twice a day.  Does report that he has still been seeing elevated blood pressures in the 180s 190s systolic 100 110s diastolic when checking it at [employer].    Denies any headaches, dizziness syncope, chest pain palpitations, shortness of breath, orthopnea, dysuria, hematuria, urinary pigmentation.  No peripheral edema, does report some right ankle pain associated with weather changes it has not been debilitated because of it.  Does report a history of a previous ankle sprain to his right ankle.  He successfully quit smoking following his hospitalization has not had any cigarettes.  He associates this with no further migraines.   ROS Per history of present illness  Past Medical Hx Reviewed: yes - significant for a hypertensive urgency as well as renal insufficiency Medications Reviewed: yes  Family History Reviewed: yes - significant for hypertensive crisis as well as heart disease and stroke that ultimately to both mother and father's death.  PE: GENERAL:  Adult AA  male.  Examined in [facility].  Sitting up comfortably on exam table  In no discomfort; norespiratory distress.   PSYCH: Alert and appropriately interactive  HNEENT: AT/[place], MMM, no scleral icterus, EOMi THORAX: HEART: RRR, S1/S2 heard, no murmur LUNGS: CTA B, no wheezes, no crackles ABDOMEN:  +BS, soft, non-tender, no rigidity, no guarding, no masses/organomegaly, no renal artery or iliac artery bruits EXTREMITIES: Moves all 4 extremities spontaneously, warm well perfused, no edema, bilateral DP and PT pulses 3+/4.   MSK:   R ankle with full range of motion.  No tenderness palpation over the ankle joint.  Negative anterior drawer sign on ankle.  Slight laxity on inversion.

## 2012-02-22 NOTE — Assessment & Plan Note (Signed)
Still unsure of etiology diagnosis as this was previously in the chart.  Patient is unaware of condition.  Right ankle pain likely associated with this as well as hypertensive symptoms.  If patient continues to have difficulty controlling blood pressure we'll consider renal artery workup for renal artery stenosis due to mixed connective tissue disease

## 2012-02-22 NOTE — Assessment & Plan Note (Signed)
Will titrate beta blocker dosage up today as has been tolerating 50 mg doses of time.  We'll do 50 mg twice a day.  Next we'll titrate to 100 mg twice a day.  We'll check basic metabolic panel today to check creatinine clearance.  Consider addition of lisinopril with combination hydrochlorothiazide if renal function to tolerate.  Followup in 2-4 weeks for recheck and medication titration.

## 2012-02-22 NOTE — Patient Instructions (Addendum)
It was nice to see you today.  .    Today we discussed:   Your BP is slightly better than it was in the hospital but we need to continue to improve it.  I am not changing your Amlodipine or your HCTZ.  However for your metoprolol I would like you to start taking a 50 mg tablet.  Please make sure that you take this medicine EVERY 12 hours as it only lasts in your system for that amount of time.    We are checking blood work today to see how you kidney function is doing.   You ankle.  Remember to practice writing your name or writing the ABCs to strengthen that ankle.  If it flares up please come in to see [place].  Please plan to return to see me in 2-4 weeks for a blood pressure check.  If you need anything prior to seeing me please call the clinic.

## 2012-02-23 LAB — BASIC METABOLIC PANEL
CO2: 27 mEq/L (ref 19–32)
Chloride: 102 mEq/L (ref 96–112)
Glucose, Bld: 71 mg/dL (ref 70–99)
Sodium: 139 mEq/L (ref 135–145)

## 2012-03-26 ENCOUNTER — Ambulatory Visit: Payer: No Typology Code available for payment source | Admitting: Sports Medicine

## 2013-06-27 ENCOUNTER — Encounter (HOSPITAL_COMMUNITY): Payer: Self-pay | Admitting: *Deleted

## 2013-06-27 ENCOUNTER — Emergency Department (HOSPITAL_COMMUNITY): Payer: Medicaid Other

## 2013-06-27 ENCOUNTER — Inpatient Hospital Stay (HOSPITAL_COMMUNITY)
Admission: EM | Admit: 2013-06-27 | Discharge: 2013-07-04 | DRG: 291 | Disposition: A | Discharge status: Home / Self Care | Payer: Medicaid Other | Attending: Cardiovascular Disease | Admitting: Cardiovascular Disease

## 2013-06-27 DIAGNOSIS — I6529 Occlusion and stenosis of unspecified carotid artery: Secondary | ICD-10-CM | POA: Diagnosis present

## 2013-06-27 DIAGNOSIS — I5043 Acute on chronic combined systolic (congestive) and diastolic (congestive) heart failure: Secondary | ICD-10-CM | POA: Diagnosis present

## 2013-06-27 DIAGNOSIS — N059 Unspecified nephritic syndrome with unspecified morphologic changes: Secondary | ICD-10-CM | POA: Diagnosis present

## 2013-06-27 DIAGNOSIS — I679 Cerebrovascular disease, unspecified: Secondary | ICD-10-CM

## 2013-06-27 DIAGNOSIS — I130 Hypertensive heart and chronic kidney disease with heart failure and stage 1 through stage 4 chronic kidney disease, or unspecified chronic kidney disease: Principal | ICD-10-CM | POA: Diagnosis present

## 2013-06-27 DIAGNOSIS — D631 Anemia in chronic kidney disease: Secondary | ICD-10-CM | POA: Diagnosis present

## 2013-06-27 DIAGNOSIS — Z9119 Patient's noncompliance with other medical treatment and regimen: Secondary | ICD-10-CM

## 2013-06-27 DIAGNOSIS — G43909 Migraine, unspecified, not intractable, without status migrainosus: Secondary | ICD-10-CM | POA: Diagnosis present

## 2013-06-27 DIAGNOSIS — N189 Chronic kidney disease, unspecified: Principal | ICD-10-CM | POA: Diagnosis present

## 2013-06-27 DIAGNOSIS — D649 Anemia, unspecified: Secondary | ICD-10-CM

## 2013-06-27 DIAGNOSIS — E871 Hypo-osmolality and hyponatremia: Secondary | ICD-10-CM

## 2013-06-27 DIAGNOSIS — N179 Acute kidney failure, unspecified: Secondary | ICD-10-CM | POA: Diagnosis present

## 2013-06-27 DIAGNOSIS — I161 Hypertensive emergency: Secondary | ICD-10-CM | POA: Diagnosis present

## 2013-06-27 DIAGNOSIS — I5021 Acute systolic (congestive) heart failure: Secondary | ICD-10-CM

## 2013-06-27 DIAGNOSIS — Z79899 Other long term (current) drug therapy: Secondary | ICD-10-CM

## 2013-06-27 DIAGNOSIS — I119 Hypertensive heart disease without heart failure: Secondary | ICD-10-CM

## 2013-06-27 DIAGNOSIS — Z91199 Patient's noncompliance with other medical treatment and regimen due to unspecified reason: Secondary | ICD-10-CM

## 2013-06-27 DIAGNOSIS — I509 Heart failure, unspecified: Secondary | ICD-10-CM | POA: Diagnosis present

## 2013-06-27 DIAGNOSIS — N184 Chronic kidney disease, stage 4 (severe): Secondary | ICD-10-CM | POA: Diagnosis present

## 2013-06-27 DIAGNOSIS — I059 Rheumatic mitral valve disease, unspecified: Secondary | ICD-10-CM | POA: Diagnosis present

## 2013-06-27 DIAGNOSIS — I272 Pulmonary hypertension, unspecified: Secondary | ICD-10-CM

## 2013-06-27 DIAGNOSIS — R931 Abnormal findings on diagnostic imaging of heart and coronary circulation: Secondary | ICD-10-CM

## 2013-06-27 DIAGNOSIS — I10 Essential (primary) hypertension: Secondary | ICD-10-CM | POA: Diagnosis present

## 2013-06-27 DIAGNOSIS — I2789 Other specified pulmonary heart diseases: Secondary | ICD-10-CM | POA: Diagnosis present

## 2013-06-27 DIAGNOSIS — I079 Rheumatic tricuspid valve disease, unspecified: Secondary | ICD-10-CM | POA: Diagnosis present

## 2013-06-27 DIAGNOSIS — Z8673 Personal history of transient ischemic attack (TIA), and cerebral infarction without residual deficits: Secondary | ICD-10-CM

## 2013-06-27 DIAGNOSIS — E876 Hypokalemia: Secondary | ICD-10-CM | POA: Diagnosis not present

## 2013-06-27 DIAGNOSIS — I43 Cardiomyopathy in diseases classified elsewhere: Secondary | ICD-10-CM | POA: Diagnosis present

## 2013-06-27 HISTORY — DX: Anemia, unspecified: D64.9

## 2013-06-27 HISTORY — DX: Pericardial effusion (noninflammatory): I31.3

## 2013-06-27 HISTORY — DX: Other pericardial effusion (noninflammatory): I31.39

## 2013-06-27 HISTORY — DX: Cerebrovascular disease, unspecified: I67.9

## 2013-06-27 HISTORY — DX: Chronic kidney disease, stage 4 (severe): N18.4

## 2013-06-27 LAB — CBC
HCT: 32.7 % — ABNORMAL LOW (ref 39.0–52.0)
Hemoglobin: 11.4 g/dL — ABNORMAL LOW (ref 13.0–17.0)
MCH: 28.5 pg (ref 26.0–34.0)
MCHC: 34.9 g/dL (ref 30.0–36.0)
MCV: 81.8 fL (ref 78.0–100.0)
RDW: 14 % (ref 11.5–15.5)

## 2013-06-27 LAB — BASIC METABOLIC PANEL
BUN: 69 mg/dL — ABNORMAL HIGH (ref 6–23)
Calcium: 8.9 mg/dL (ref 8.4–10.5)
Creatinine, Ser: 3.88 mg/dL — ABNORMAL HIGH (ref 0.50–1.35)
GFR calc Af Amer: 21 mL/min — ABNORMAL LOW (ref >90)
GFR calc non Af Amer: 18 mL/min — ABNORMAL LOW (ref >90)
Glucose, Bld: 87 mg/dL (ref 70–99)

## 2013-06-27 MED ORDER — NITROGLYCERIN IN D5W 200-5 MCG/ML-% IV SOLN
2.0000 ug/min | Freq: Once | INTRAVENOUS | Status: AC
  Administered 2013-06-27: 5 ug/min via INTRAVENOUS
  Filled 2013-06-27: qty 250

## 2013-06-27 MED ORDER — FUROSEMIDE 10 MG/ML IJ SOLN
40.0000 mg | Freq: Once | INTRAMUSCULAR | Status: AC
  Administered 2013-06-28: 40 mg via INTRAVENOUS
  Filled 2013-06-27: qty 4

## 2013-06-27 MED ORDER — LABETALOL HCL 5 MG/ML IV SOLN
20.0000 mg | Freq: Once | INTRAVENOUS | Status: AC
  Administered 2013-06-27: 20 mg via INTRAVENOUS
  Filled 2013-06-27: qty 4

## 2013-06-27 MED ORDER — FUROSEMIDE 10 MG/ML IJ SOLN: 40.0000 mg | Freq: Once | INTRAMUSCULAR | Status: DC

## 2013-06-27 NOTE — ED Notes (Signed)
Felix Pacini, MD at bedside.

## 2013-06-27 NOTE — ED Notes (Signed)
Pt states he has been SOB x several weeks. Pt also with high blood pressure, and states that he has been out of his BP medication for over a month.

## 2013-06-27 NOTE — ED Provider Notes (Signed)
CSN: [id number]     Arrival date & time 06/27/13  2025 History     First MD Initiated Contact with Patient 06/27/13 2132     Chief Complaint  Patient presents with  . Shortness of Breath   (Consider location/radiation/quality/duration/timing/severity/associated sxs/prior Treatment) HPI Complains of shortness of breath for one month. Unchanged today worse with lying supine states "I'm just tired of it" . Denies chest pain denies headache denies visual changes denies other associated symptoms. Admits to noncompliance with his medications for several weeks. Symptoms worse with lying supine improved with sitting up. Past Medical History  Diagnosis Date  . Hypertension    No past surgical history on file. Family History  Problem Relation Age of Onset  . Hypertension Mother   . Hypertension Father   . Stroke Mother   . Stroke Father   . Heart disease Mother   . Heart disease Father   . Hypertension Brother    History  Substance Use Topics  . Smoking status: Never Smoker   . Smokeless tobacco: Not on file  . Alcohol Use: No    Review of Systems  Constitutional: Negative.   HENT: Negative.   Respiratory: Positive for shortness of breath.   Cardiovascular: Negative.   Gastrointestinal: Negative.   Musculoskeletal: Negative.   Skin: Negative.   Neurological: Negative.   Psychiatric/Behavioral: Negative.   All other systems reviewed and are negative.    Allergies  Amoxicillin  Home Medications   Current Outpatient Rx  Name  Route  Sig  Dispense  Refill  . amLODipine (NORVASC) 10 MG tablet   Oral   Take 10 mg by mouth daily.         . Aspirin-Acetaminophen-Caffeine (EXCEDRIN PO)   Oral   Take 2 tablets by mouth daily as needed (for headache).         . Aspirin-Salicylamide-Caffeine (BC HEADACHE PO)   Oral   Take 1 packet by mouth daily as needed (for headache).         . hydrochlorothiazide (HYDRODIURIL) 25 MG tablet   Oral   Take 25 mg by mouth daily.         . metoprolol (LOPRESSOR) 50 MG tablet   Oral   Take 50 mg by mouth 2 (two) times daily.          BP 241/177  Pulse 99  Temp(Src) 97.9 F (36.6 C) (Oral)  Resp 28  SpO2 97% Physical Exam  Nursing note and vitals reviewed. Constitutional: He appears well-developed and well-nourished. No distress.  HENT:  Head: Normocephalic and atraumatic.  Eyes: Conjunctivae are normal. Pupils are equal, round, and reactive to light.  Neck: Neck supple. No tracheal deviation present. No thyromegaly present.  Cardiovascular: Normal rate and regular rhythm.   No murmur heard. Pulmonary/Chest: Effort normal. He has rales.  Rales at bases bilaterally  Abdominal: Soft. Bowel sounds are normal. He exhibits no distension. There is no tenderness.  Musculoskeletal: Normal range of motion. He exhibits no edema and no tenderness.  Neurological: He is alert. Coordination normal.  Skin: Skin is warm and dry. No rash noted.  Psychiatric: He has a normal mood and affect.    ED Course   Procedures (including critical care time)  Labs Reviewed  CBC  BASIC METABOLIC PANEL  PRO B NATRIURETIC PEPTIDE   Dg Chest 2 View  06/27/2013   *RADIOLOGY REPORT*  Clinical Data: Shortness of breath for 1 week, worsening today.  CHEST - 2 VIEW  Comparison: 07/23/2011  and 04/15/2011 radiographs; CT 08/21/2009.  Findings: There is progressive cardiomegaly and/or recurrent pericardial effusion.  There is progressive diffuse interstitial prominence throughout both lungs suspicious for pulmonary edema. There are small bilateral pleural effusions.  There is likely some extension of pleural fluid into the left major fissure.  No acute osseous findings are seen.  IMPRESSION: Progressive cardiomegaly, diffuse interstitial prominence and small bilateral pleural effusions most consistent with congestive heart failure.  Atypical infection could have this appearance. Of note, the patient had a pericardial effusion on prior CT; a  recurrent pericardial effusion could account for the enlargement of the cardiac silhouette.   Original Report Authenticated By: Carey Bullocks, M.D.   No diagnosis found.  Date: 06/27/2013  Rate: 95  Rhythm: normal sinus rhythm  QRS Axis: normal  Intervals: normal  ST/T Wave abnormalities: nonspecific T wave changes  Conduction Disutrbances:none  Narrative Interpretation:   Old EKG Reviewed: Inferolateral ischemic changes from 04/15/2011 have resolved interpreted by me  Chest x-ray reviewed by me At 20 3:10 PM patient breathing had not improved after treatment with intravenous nitroglycerin and labetalol. On reexamination patient appears essentially the same as on initial presentation. He speaks in sentences. Lungs Rales at bases bilaterally. Respiratory 28 minute as counted by me. Intravenous Lasix ordered. MDM  Patient suffering from hypertensive emergency with renal failure and congestive heart failure. I spoke with Dr. Belinda Block. Plan patient will be admitted to intensive.cre unit.Peggye Form called cardiology consult at her request with Dr. Mayford Knife who will obtain echocardiogram on patient to check ejection fraction. Diagnosis #1 hypertensive emergency #2 congestive heart failure #3 pericardial effusion #4 renal failure #5 hyponatremia #6 medication noncompliance CRITICAL CARE Performed by: Doug Sou Total critical care time: 40 minute Critical care time was exclusive of separately billable procedures and treating other patients. Critical care was necessary to treat or prevent imminent or life-threatening deterioration. Critical care was time spent personally by me on the following activities: development of treatment plan with patient and/or surrogate as well as nursing, discussions with consultants, evaluation of patient's response to treatment, examination of patient, obtaining history from patient or surrogate, ordering and performing treatments and interventions, ordering and  review of laboratory studies, ordering and review of radiographic studies, pulse oximetry and re-evaluation of patient's condition.    Doug Sou, MD 06/28/13 0005

## 2013-06-28 ENCOUNTER — Inpatient Hospital Stay (HOSPITAL_COMMUNITY): Payer: Medicaid Other

## 2013-06-28 ENCOUNTER — Encounter (HOSPITAL_COMMUNITY): Payer: Self-pay | Admitting: Internal Medicine

## 2013-06-28 DIAGNOSIS — I10 Essential (primary) hypertension: Secondary | ICD-10-CM

## 2013-06-28 DIAGNOSIS — I5021 Acute systolic (congestive) heart failure: Secondary | ICD-10-CM

## 2013-06-28 DIAGNOSIS — N179 Acute kidney failure, unspecified: Secondary | ICD-10-CM

## 2013-06-28 DIAGNOSIS — I161 Hypertensive emergency: Secondary | ICD-10-CM | POA: Diagnosis present

## 2013-06-28 LAB — BASIC METABOLIC PANEL
BUN: 82 mg/dL — ABNORMAL HIGH (ref 6–23)
CO2: 17 mEq/L — ABNORMAL LOW (ref 19–32)
Calcium: 8.6 mg/dL (ref 8.4–10.5)
Creatinine, Ser: 4.06 mg/dL — ABNORMAL HIGH (ref 0.50–1.35)
Creatinine, Ser: 3.92 mg/dL — ABNORMAL HIGH (ref 0.50–1.35)
GFR calc Af Amer: 21 mL/min — ABNORMAL LOW (ref >90)
GFR calc non Af Amer: 18 mL/min — ABNORMAL LOW (ref >90)
Glucose, Bld: 90 mg/dL (ref 70–99)
Glucose, Bld: 99 mg/dL (ref 70–99)

## 2013-06-28 LAB — RAPID URINE DRUG SCREEN, HOSP PERFORMED
Amphetamines: NOT DETECTED
Benzodiazepines: NOT DETECTED
Cocaine: NOT DETECTED
Opiates: NOT DETECTED
Tetrahydrocannabinol: NOT DETECTED

## 2013-06-28 LAB — TROPONIN I: Troponin I: <0.3 ng/mL (ref <0.30)

## 2013-06-28 LAB — PRO B NATRIURETIC PEPTIDE: Pro B Natriuretic peptide (BNP): 42206 pg/mL — ABNORMAL HIGH (ref 0–125)

## 2013-06-28 MED ORDER — CARVEDILOL 12.5 MG PO TABS
12.5000 mg | ORAL_TABLET | Freq: Two times a day (BID) | ORAL | Status: DC
  Administered 2013-06-28 – 2013-07-04 (×14): 12.5 mg via ORAL
  Filled 2013-06-28 (×20): qty 1

## 2013-06-28 MED ORDER — HYDROCHLOROTHIAZIDE 25 MG PO TABS: 25.0000 mg | ORAL_TABLET | Freq: Every day | ORAL | Status: DC

## 2013-06-28 MED ORDER — AMLODIPINE BESYLATE 10 MG PO TABS
10.0000 mg | ORAL_TABLET | Freq: Every day | ORAL | Status: DC
  Administered 2013-06-28: 10 mg via ORAL
  Filled 2013-06-28: qty 1

## 2013-06-28 MED ORDER — NITROGLYCERIN IN D5W 200-5 MCG/ML-% IV SOLN
2.0000 ug/min | Freq: Once | INTRAVENOUS | Status: AC
  Administered 2013-06-28: 50 ug/min via INTRAVENOUS

## 2013-06-28 MED ORDER — NITROGLYCERIN IN D5W 200-5 MCG/ML-% IV SOLN
2.0000 ug/min | INTRAVENOUS | Status: DC
  Administered 2013-06-28 (×2): 200 ug/min via INTRAVENOUS
  Filled 2013-06-28 (×2): qty 250

## 2013-06-28 MED ORDER — FUROSEMIDE 40 MG PO TABS
40.0000 mg | ORAL_TABLET | Freq: Every day | ORAL | Status: DC
  Administered 2013-06-28: 40 mg via ORAL
  Filled 2013-06-28: qty 1

## 2013-06-28 MED ORDER — LABETALOL HCL 5 MG/ML IV SOLN
10.0000 mg | INTRAVENOUS | Status: DC | PRN
  Administered 2013-06-28 (×2): 10 mg via INTRAVENOUS
  Filled 2013-06-28 (×2): qty 4

## 2013-06-28 MED ORDER — AMLODIPINE BESYLATE 10 MG PO TABS
10.0000 mg | ORAL_TABLET | ORAL | Status: AC
  Administered 2013-06-28: 10 mg via ORAL
  Filled 2013-06-28: qty 1

## 2013-06-28 MED ORDER — METOPROLOL TARTRATE 25 MG PO TABS: 50.0000 mg | ORAL_TABLET | Freq: Two times a day (BID) | ORAL | Status: DC

## 2013-06-28 MED ORDER — ACETAMINOPHEN 325 MG PO TABS
650.0000 mg | ORAL_TABLET | Freq: Four times a day (QID) | ORAL | Status: DC | PRN
  Administered 2013-06-28 – 2013-06-29 (×3): 650 mg via ORAL
  Filled 2013-06-28 (×3): qty 2

## 2013-06-28 MED ORDER — POTASSIUM CHLORIDE CRYS ER 20 MEQ PO TBCR
20.0000 meq | EXTENDED_RELEASE_TABLET | Freq: Once | ORAL | Status: AC
  Administered 2013-06-28: 20 meq via ORAL
  Filled 2013-06-28: qty 1

## 2013-06-28 MED ORDER — HYDRALAZINE HCL 50 MG PO TABS
50.0000 mg | ORAL_TABLET | Freq: Three times a day (TID) | ORAL | Status: DC
  Administered 2013-06-28 – 2013-06-29 (×3): 50 mg via ORAL
  Filled 2013-06-28 (×7): qty 1

## 2013-06-28 MED ORDER — NICARDIPINE HCL IN NACL 20-0.86 MG/200ML-% IV SOLN
0.0000 mg/h | INTRAVENOUS | Status: DC
  Administered 2013-06-28 – 2013-06-29 (×7): 5 mg/h via INTRAVENOUS
  Administered 2013-06-30: 1.5 mg/h via INTRAVENOUS
  Filled 2013-06-28 (×7): qty 200

## 2013-06-28 NOTE — ED Notes (Signed)
Pt states that it is becoming easier for him to breathe. This RN notes that the pt's breathing is not as labored, and he is more relaxed.

## 2013-06-28 NOTE — Consult Note (Addendum)
PULMONARY  / CRITICAL CARE MEDICINE  Name: Matthew Beard MRN: [id number]  DOB: 01/05/1976    ADMISSION DATE:  06/27/2013 CONSULTATION DATE:  06/28/2013  REFERRING MD :  Dr. Jacobowitz PRIMARY SERVICE: Critical Care Medicine  CHIEF COMPLAINT:  Shortness of breath  BRIEF PATIENT DESCRIPTION: Matthew Beard is a 37-year-old male with hypertensive emergency in the setting of medication noncompliance.  SIGNIFICANT EVENTS / STUDIES:  1. Chest x-ray dated 06/28/2013 demonstrates cardiomegaly, pulmonary edema, and small bilateral effusion  LINES / TUBES: 1. Peripheral IV only  CULTURES: 1. None  ANTIBIOTICS: 1. None  HISTORY OF PRESENT ILLNESS:  Matthew Beard is a 37-year-old male with hypertension and several past episodes of hypertensive emergency who presents to [place] [facility] tonight for shortness of breath. The shortness of breath began approximately one month ago after he ran out of all of his antihypertensive medications. He particularly notices the shortness of breath when attempting to lie down flat and was unable to sleep for extended periods of time secondary to dyspnea. He denies associated lower short edema or increased abdominal girth. He denied chest pain to me, however, described as 3/10 chest pain to the cardiology consultant. He has occasional headaches and does not have currently. He denies cough, fevers, or chills.   PAST MEDICAL HISTORY :  Past Medical History  Diagnosis Date  . Hypertension     History reviewed. No pertinent past surgical history.  Prior to Admission medications   Medication Sig Start Date End Date Taking? Authorizing Provider  amLODipine (NORVASC) 10 MG tablet Take 10 mg by mouth daily.   Yes Historical Provider, MD  Aspirin-Acetaminophen-Caffeine (EXCEDRIN PO) Take 2 tablets by mouth daily as needed (for headache).   Yes Historical Provider, MD  Aspirin-Salicylamide-Caffeine (BC HEADACHE PO) Take 1 packet by mouth daily as needed (for headache).    Yes Historical Provider, MD  hydrochlorothiazide (HYDRODIURIL) 25 MG tablet Take 25 mg by mouth daily.   Yes Historical Provider, MD  metoprolol (LOPRESSOR) 50 MG tablet Take 50 mg by mouth 2 (two) times daily.   Yes Historical Provider, MD    Allergies  Allergen Reactions  . Amoxicillin Hives    FAMILY HISTORY:  Family History  Problem Relation Age of Onset  . Hypertension Mother   . Hypertension Father   . Stroke Mother   . Stroke Father   . Heart disease Mother   . Heart disease Father   . Hypertension Brother     SOCIAL HISTORY:  reports that he has never smoked. He does not have any smokeless tobacco history on file. He reports that he does not drink alcohol or use illicit drugs.  REVIEW OF SYSTEMS: A 12 system review of systems was conducted and, unless otherwise specified in the history of present illness, was negative.  PHYSICAL EXAM  VITAL SIGNS: Temp:  [97.4 F (36.3 C)-97.9 F (36.6 C)] 97.9 F (36.6 C) (08/09 2133) Pulse Rate:  [78-99] 85 (08/10 0007) Resp:  [10-34] 10 (08/10 0007) BP: (178-241)/(40-187) 206/155 mmHg (08/10 0007) SpO2:  [10 %-100 %] 100 % (08/10 0007)  HEMODYNAMICS:    VENTILATOR SETTINGS:    INTAKE / OUTPUT: Intake/Output   None     PHYSICAL EXAMINATION: General: Well-developed well-nourished male in no acute distress  Neuro:   intact HEENT:   sclera anicteric, conjunctiva pink, mucous membranes moist, oropharynx clear Neck:   trachea supple in midline, no lymphadenopathy or JVD Cardiovascular:  Regular rate rhythm, exaggerated S1/S2, no murmurs rubs or gallops    Lungs:   mild crackles at the bases bilaterally; normal work of breathing Abdomen:   soft, nontender, nondistended, positive bowel sounds Musculoskeletal:   no clubbing cyanosis or edema  Skin:   intact  LABS:  CBC Recent Labs     06/27/13  2147  WBC  7.6  HGB  11.4*  HCT  32.7*  PLT  270    Coag's No results found for this basename: APTT, INR,  in the  last 72 hours  BMET Recent Labs     06/27/13  2147  NA  126*  K  3.8  CL  89*  CO2  19  BUN  69*  CREATININE  3.88*  GLUCOSE  87    Electrolytes Recent Labs     06/27/13  2147  CALCIUM  8.9    Sepsis Markers No results found for this basename: LACTICACIDVEN, PROCALCITON, O2SATVEN,  in the last 72 hours  ABG No results found for this basename: PHART, PCO2ART, PO2ART,  in the last 72 hours  Liver Enzymes No results found for this basename: AST, ALT, ALKPHOS, BILITOT, ALBUMIN,  in the last 72 hours  Cardiac Enzymes Recent Labs     06/27/13  2147  PROBNP  66702.0*    Glucose No results found for this basename: GLUCAP,  in the last 72 hours  Imaging Dg Chest 2 View  06/27/2013   *RADIOLOGY REPORT*  Clinical Data: Shortness of breath for 1 week, worsening today.  CHEST - 2 VIEW  Comparison: 07/23/2011 and 04/15/2011 radiographs; CT 08/21/2009.  Findings: There is progressive cardiomegaly and/or recurrent pericardial effusion.  There is progressive diffuse interstitial prominence throughout both lungs suspicious for pulmonary edema. There are small bilateral pleural effusions.  There is likely some extension of pleural fluid into the left major fissure.  No acute osseous findings are seen.  IMPRESSION: Progressive cardiomegaly, diffuse interstitial prominence and small bilateral pleural effusions most consistent with congestive heart failure.  Atypical infection could have this appearance. Of note, the patient had a pericardial effusion on prior CT; a recurrent pericardial effusion could account for the enlargement of the cardiac silhouette.   Original Report Authenticated By: William Veazey, M.D.    EKG:  biatrial and left ventricular hypertrophy, likely repolarization abnormality  CXR: Cardiomegaly with diffuse edema and small bilateral effusions   ASSESSMENT / PLAN: Principal Problem:   Hypertensive emergency Active Problems:   Uncontrolled hypertension   Acute kidney  injury   Chronic renal insufficiency   PULMONARY A: 1. Acute pulmonary edema:  this is almost certainly secondary to the patient's hypertensive crisis. It is unclear if he has underlying baseline heart failure at this time. Prior port, his breathing has improved markedly since he began diuresis.   P:    Supplemental O2 as needed  Continue diuresis  CARDIOVASCULAR A:  1. Hypertensive emergency:  is almost certainly secondary to the patient's medication noncompliance. We had a brief discussion about the importance of obtaining a primary care physician as well as taking his prescribed medications. 2. Chest pain: Mr. Reeg denied chest pain to me, however, did mention a 3/10 chest pain to the cardiology consultants. I agree with her assessment that this is unlikely to be a dissection.   P:   Nitroglycerin drip for immediate control of blood pressure  Restart outpatient antihypertensive regimen  Goals blood pressure for tonight approximately 160/90  Check bilateral upper showing blood pressures  Echocardiogram per cardiology  RENAL A:  1.  Acute on chronic kidney   injury: This is almost certainly secondary to the patient's hypertensive emergency. We had a discussion about the importance of blood pressure control for renal protection. We will have to hold his "home" hydrochlorothiazide as his renal function is likely bad enough at this point it will not be effective.  P:    Serial BMPs  GASTROINTESTINAL A: No acute issue:  HEMATOLOGIC A:   1. Anemia: This is most likely anemia of chronic disease given the iron panel from 08/19/2009. This is likely secondary to the patient's chronic kidney disease.  P:   Monitor  INFECTIOUS A:  No acute issue  ENDOCRINE A:    No acute issue  NEUROLOGIC A:   No acute issue  BEST PRACTICE / DISPOSITION Level of Care:  ICU Consultants:  Cardiology Code Status:  Full Diet:  Low-salt DVT Px:  Ambulatory GI Px:  Not  needed Skin Integrity:  Intact Social / Family:    TODAY'S SUMMARY: Started on nitroglycerin drip, given dose of Coreg and amlodipine.  I have personally obtained a history, examined the patient, evaluated laboratory and imaging results, formulated the assessment and plan and placed orders.  CRITICAL CARE: The patient is critically ill with multiple organ systems failure and requires high complexity decision making for assessment and support, frequent evaluation and titration of therapies, application of advanced monitoring technologies and extensive interpretation of multiple databases. Critical Care Time devoted to patient care services described in this note is 45 minutes.    Pulmonary and Critical Care Medicine [facility] Pager: [phone]  06/28/2013, 1:06 AM     

## 2013-06-28 NOTE — Progress Notes (Signed)
2D echo prelim shows dilated LV with severe LV dysfunction EF 20-25% with moderate MR, severe TR, dilated IVC with no respiratory collapse, mild AI with borderline dilated ascending aorta at 3.8cm with no evidence of proximal dissection.

## 2013-06-28 NOTE — H&P (Signed)
PULMONARY  / CRITICAL CARE MEDICINE  Name: Matthew Beard MRN: [id number]  DOB: 1976-08-02    ADMISSION DATE:  06/27/2013 CONSULTATION DATE:  06/28/2013  REFERRING MD :  Dr. Rennis Chris PRIMARY SERVICE: Critical Care Medicine  CHIEF COMPLAINT:  Shortness of breath  BRIEF PATIENT DESCRIPTION: [name] is a 37 year old male with hypertensive emergency in the setting of medication noncompliance.  SIGNIFICANT EVENTS / STUDIES:  1. Chest x-ray dated 06/28/2013 demonstrates cardiomegaly, pulmonary edema, and small bilateral effusion  LINES / TUBES: 1. Peripheral IV only  CULTURES: 1. None  ANTIBIOTICS: 1. None  HISTORY OF PRESENT ILLNESS:  [name] is a 37 year old male with hypertension and several past episodes of hypertensive emergency who presents to [facility] tonight for shortness of breath. The shortness of breath began approximately one month ago after he ran out of all of his antihypertensive medications. He particularly notices the shortness of breath when attempting to lie down flat and was unable to sleep for extended periods of time secondary to dyspnea. He denies associated lower short edema or increased abdominal girth. He denied chest pain to me, however, described as 3/10 chest pain to the cardiology consultant. He has occasional headaches and does not have currently. He denies cough, fevers, or chills.   PAST MEDICAL HISTORY :  Past Medical History  Diagnosis Date  . Hypertension     History reviewed. No pertinent past surgical history.  Prior to Admission medications   Medication Sig Start Date End Date Taking? Authorizing Provider  amLODipine (NORVASC) 10 MG tablet Take 10 mg by mouth daily.   Yes Historical Provider, MD  Aspirin-Acetaminophen-Caffeine (EXCEDRIN PO) Take 2 tablets by mouth daily as needed (for headache).   Yes Historical Provider, MD  Aspirin-Salicylamide-Caffeine (BC HEADACHE PO) Take 1 packet by mouth daily as needed (for headache).    Yes Historical Provider, MD  hydrochlorothiazide (HYDRODIURIL) 25 MG tablet Take 25 mg by mouth daily.   Yes Historical Provider, MD  metoprolol (LOPRESSOR) 50 MG tablet Take 50 mg by mouth 2 (two) times daily.   Yes Historical Provider, MD    Allergies  Allergen Reactions  . Amoxicillin Hives    FAMILY HISTORY:  Family History  Problem Relation Age of Onset  . Hypertension Mother   . Hypertension Father   . Stroke Mother   . Stroke Father   . Heart disease Mother   . Heart disease Father   . Hypertension Brother     SOCIAL HISTORY:  reports that he has never smoked. He does not have any smokeless tobacco history on file. He reports that he does not drink alcohol or use illicit drugs.  REVIEW OF SYSTEMS: A 12 system review of systems was conducted and, unless otherwise specified in the history of present illness, was negative.  PHYSICAL EXAM  VITAL SIGNS: Temp:  [97.4 F (36.3 C)-97.9 F (36.6 C)] 97.9 F (36.6 C) (08/09 2133) Pulse Rate:  [78-99] 85 (08/10 0007) Resp:  [10-34] 10 (08/10 0007) BP: (178-241)/(40-187) 206/155 mmHg (08/10 0007) SpO2:  [10 %-100 %] 100 % (08/10 0007)  HEMODYNAMICS:    VENTILATOR SETTINGS:    INTAKE / OUTPUT: Intake/Output   None     PHYSICAL EXAMINATION: General: Well-developed well-nourished male in no acute distress  Neuro:   intact HEENT:   sclera anicteric, conjunctiva pink, mucous membranes moist, oropharynx clear Neck:   trachea supple in midline, no lymphadenopathy or JVD Cardiovascular:  Regular rate rhythm, exaggerated S1/S2, no murmurs rubs or gallops  Lungs:   mild crackles at the bases bilaterally; normal work of breathing Abdomen:   soft, nontender, nondistended, positive bowel sounds Musculoskeletal:   no clubbing cyanosis or edema  Skin:   intact  LABS:  CBC Recent Labs     06/27/13  2147  WBC  7.6  HGB  11.4*  HCT  32.7*  PLT  270    Coag's No results found for this basename: APTT, INR,  in the  last 72 hours  BMET Recent Labs     06/27/13  2147  NA  126*  K  3.8  CL  89*  CO2  19  BUN  69*  CREATININE  3.88*  GLUCOSE  87    Electrolytes Recent Labs     06/27/13  2147  CALCIUM  8.9    Sepsis Markers No results found for this basename: LACTICACIDVEN, PROCALCITON, O2SATVEN,  in the last 72 hours  ABG No results found for this basename: PHART, PCO2ART, PO2ART,  in the last 72 hours  Liver Enzymes No results found for this basename: AST, ALT, ALKPHOS, BILITOT, ALBUMIN,  in the last 72 hours  Cardiac Enzymes Recent Labs     06/27/13  2147  PROBNP  66702.0*    Glucose No results found for this basename: GLUCAP,  in the last 72 hours  Imaging Dg Chest 2 View  06/27/2013   *RADIOLOGY REPORT*  Clinical Data: Shortness of breath for 1 week, worsening today.  CHEST - 2 VIEW  Comparison: 07/23/2011 and 04/15/2011 radiographs; CT 08/21/2009.  Findings: There is progressive cardiomegaly and/or recurrent pericardial effusion.  There is progressive diffuse interstitial prominence throughout both lungs suspicious for pulmonary edema. There are small bilateral pleural effusions.  There is likely some extension of pleural fluid into the left major fissure.  No acute osseous findings are seen.  IMPRESSION: Progressive cardiomegaly, diffuse interstitial prominence and small bilateral pleural effusions most consistent with congestive heart failure.  Atypical infection could have this appearance. Of note, the patient had a pericardial effusion on prior CT; a recurrent pericardial effusion could account for the enlargement of the cardiac silhouette.   Original Report Authenticated By: Carey Bullocks, M.D.    EKG:  biatrial and left ventricular hypertrophy, likely repolarization abnormality  CXR: Cardiomegaly with diffuse edema and small bilateral effusions   ASSESSMENT / PLAN: Principal Problem:   Hypertensive emergency Active Problems:   Uncontrolled hypertension   Acute kidney  injury   Chronic renal insufficiency   PULMONARY A: 1. Acute pulmonary edema:  this is almost certainly secondary to the patient's hypertensive crisis. It is unclear if he has underlying baseline heart failure at this time. Prior port, his breathing has improved markedly since he began diuresis.   P:    Supplemental O2 as needed  Continue diuresis  CARDIOVASCULAR A:  1. Hypertensive emergency:  is almost certainly secondary to the patient's medication noncompliance. We had a brief discussion about the importance of obtaining a primary care physician as well as taking his prescribed medications. 2. Chest pain: Mr. Rodeheaver denied chest pain to me, however, did mention a 3/10 chest pain to the cardiology consultants. I agree with her assessment that this is unlikely to be a dissection.   P:   Nitroglycerin drip for immediate control of blood pressure  Restart outpatient antihypertensive regimen  Goals blood pressure for tonight approximately 160/90  Check bilateral upper showing blood pressures  Echocardiogram per cardiology  RENAL A:  1.  Acute on chronic kidney  injury: This is almost certainly secondary to the patient's hypertensive emergency. We had a discussion about the importance of blood pressure control for renal protection. We will have to hold his "home" hydrochlorothiazide as his renal function is likely bad enough at this point it will not be effective.  P:    Serial BMPs  GASTROINTESTINAL A: No acute issue:  HEMATOLOGIC A:   1. Anemia: This is most likely anemia of chronic disease given the iron panel from 08/19/2009. This is likely secondary to the patient's chronic kidney disease.  P:   Monitor  INFECTIOUS A:  No acute issue  ENDOCRINE A:    No acute issue  NEUROLOGIC A:   No acute issue  BEST PRACTICE / DISPOSITION Level of Care:  ICU Consultants:  Cardiology Code Status:  Full Diet:  Low-salt DVT Px:  Ambulatory GI Px:  Not  needed Skin Integrity:  Intact Social / Family:    TODAY'S SUMMARY: Started on nitroglycerin drip, given dose of Coreg and amlodipine.  I have personally obtained a history, examined the patient, evaluated laboratory and imaging results, formulated the assessment and plan and placed orders.  CRITICAL CARE: The patient is critically ill with multiple organ systems failure and requires high complexity decision making for assessment and support, frequent evaluation and titration of therapies, application of advanced monitoring technologies and extensive interpretation of multiple databases. Critical Care Time devoted to patient care services described in this note is 45 minutes.    Pulmonary and Critical Care Medicine [facility] Pager: [phone]  06/28/2013, 1:06 AM

## 2013-06-28 NOTE — Progress Notes (Signed)
  Echocardiogram 2D Echocardiogram has been performed.  Matthew Beard [name] 06/28/2013, 1:16 AM

## 2013-06-28 NOTE — Consult Note (Signed)
Admit date: 06/27/2013 Referring Physician  Dr. Ethelda Chick Primary Physician  Dr. Leveda Anna Primary Cardiologist  Dr. Tenny Craw Reason for Consultation  Hypertensive urgency with SOB  HPI: This is a 37yo AAM with a history of HTN poorly controlled due to medical noncompliance,hypertensive kidney disease, anemia, migraine HA and pericardial effusion remotely who presented to the ER with complaints of shortness of breath for one month. He says that he has been having PND and orthopnea to the point he has had to sleep in a chair to breath.  He presented to the ER tonight with SOB and was found to be markedly hypertensive.  Chest xray showed an enlarged heart.  He denied any chest pain until admit to ER when he started to complain of 3/10 midsternal CP with no radiation.  He admits to noncompliance with his medications for several weeks. Symptoms worse with lying supine improved with sitting up.  Cardiology is now asked to consult for 2D echo to assess LVF as well as help with management of hypertensive urgency and acute diastolic CHF.      PMH:   Past Medical History  Diagnosis Date   Medical noncompliance    Hypertensive kidney disease    Remote pericardial effusion    Mixed connective tissue disease    Diastolic dysfunction by echo 2010   . Hypertension      PSH:  No past surgical history on file.  Allergies:  Amoxicillin Prior to Admit Meds:   (Not in a hospital admission) Fam HX:    Family History  Problem Relation Age of Onset  . Hypertension Mother   . Hypertension Father   . Stroke Mother   . Stroke Father   . Heart disease Mother   . Heart disease Father   . Hypertension Brother    Social HX:    History   Social History  . Marital Status: Single    Spouse Name: N/A    Number of Children: N/A  . Years of Education: N/A   Occupational History  . Not on file.   Social History Main Topics  . Smoking status: Never Smoker   . Smokeless tobacco: Not on file  . Alcohol Use: No   . Drug Use: No  . Sexually Active:    Other Topics Concern  . Not on file   Social History Narrative  . No narrative on file     ROS:  All 11 ROS were addressed and are negative except what is stated in the HPI  Physical Exam: Blood pressure 206/155, pulse 85, temperature 97.9 F (36.6 C), temperature source Oral, resp. rate 10, SpO2 100.00%.    General: Well developed, well nourished, in no acute distress Head: Eyes PERRLA, No xanthomas.   Normal cephalic and atramatic  Lungs:   Crackles at bases Heart:   HRRR S1 S2 Pulses are 2+ & equal.            No carotid bruit. No JVD.  No abdominal bruits. No femoral bruits. Abdomen: Bowel sounds are positive, abdomen soft and non-tender without masses  Extremities:   No clubbing, cyanosis or edema.  DP +1 Neuro: Alert and oriented X 3. Psych:  Good affect, responds appropriately    Labs:   Lab Results  Component Value Date   WBC 7.6 06/27/2013   HGB 11.4* 06/27/2013   HCT 32.7* 06/27/2013   MCV 81.8 06/27/2013   PLT 270 06/27/2013    Recent Labs Lab 06/27/13 2147  NA 126*  K 3.8  CL 89*  CO2 19  BUN 69*  CREATININE 3.88*  CALCIUM 8.9  GLUCOSE 87   No results found for this basename: PTT   Lab Results  Component Value Date   INR 1.2 08/21/2009   INR 0.9 03/09/2008   Lab Results  Component Value Date   CKTOTAL 117 01/22/2012   CKMB 2.9 01/22/2012   TROPONINI <0.30 01/22/2012     Lab Results  Component Value Date   CHOL 187 01/22/2012   CHOL  Value: 133        ATP III CLASSIFICATION:  <200     mg/dL   Desirable  161-096  mg/dL   Borderline High  >=045    mg/dL   High        40/07/8118   CHOL  Value: 164        ATP III CLASSIFICATION:  <200     mg/dL   Desirable  147-829  mg/dL   Borderline High  >=562    mg/dL   High        12/19/8655   Lab Results  Component Value Date   HDL 38* 01/22/2012   HDL 18* 08/22/2009   HDL 26* 02/04/2009   Lab Results  Component Value Date   LDLCALC 124* 01/22/2012   LDLCALC  Value: 91         Total Cholesterol/HDL:CHD Risk Coronary Heart Disease Risk Table                     Men   Women  1/2 Average Risk   3.4   3.3  Average Risk       5.0   4.4  2 X Average Risk   9.6   7.1  3 X Average Risk  23.4   11.0        Use the calculated Patient Ratio above and the CHD Risk Table to determine the patient's CHD Risk.        ATP III CLASSIFICATION (LDL):  <100     mg/dL   Optimal  846-962  mg/dL   Near or Above                    Optimal  130-159  mg/dL   Borderline  952-841  mg/dL   High  >324     mg/dL   Very High 40/11/270   LDLCALC  Value: 112        Total Cholesterol/HDL:CHD Risk Coronary Heart Disease Risk Table                     Men   Women  1/2 Average Risk   3.4   3.3  Average Risk       5.0   4.4  2 X Average Risk   9.6   7.1  3 X Average Risk  23.4   11.0        Use the calculated Patient Ratio above and the CHD Risk Table to determine the patient's CHD Risk.        ATP III CLASSIFICATION (LDL):  <100     mg/dL   Optimal  536-644  mg/dL   Near or Above                    Optimal  130-159  mg/dL   Borderline  034-742  mg/dL   High  >595     mg/dL   Very  High* 02/04/2009   Lab Results  Component Value Date   TRIG 125 01/22/2012   TRIG 119 08/22/2009   TRIG 130 02/04/2009   Lab Results  Component Value Date   CHOLHDL 4.9 01/22/2012   CHOLHDL 7.4 08/22/2009   CHOLHDL 6.3 02/04/2009   No results found for this basename: LDLDIRECT      Radiology:  Dg Chest 2 View  06/27/2013   *RADIOLOGY REPORT*  Clinical Data: Shortness of breath for 1 week, worsening today.  CHEST - 2 VIEW  Comparison: 07/23/2011 and 04/15/2011 radiographs; CT 08/21/2009.  Findings: There is progressive cardiomegaly and/or recurrent pericardial effusion.  There is progressive diffuse interstitial prominence throughout both lungs suspicious for pulmonary edema. There are small bilateral pleural effusions.  There is likely some extension of pleural fluid into the left major fissure.  No acute osseous findings are seen.   IMPRESSION: Progressive cardiomegaly, diffuse interstitial prominence and small bilateral pleural effusions most consistent with congestive heart failure.  Atypical infection could have this appearance. Of note, the patient had a pericardial effusion on prior CT; a recurrent pericardial effusion could account for the enlargement of the cardiac silhouette.   Original Report Authenticated By: Carey Bullocks, M.D.    EKG:  NSR with biatrial enlargement, LVH with repol abnormality  ASSESSMENT:  1.  Hypertensive emergency complicated by acute diastolic CHF 2.  Acute diastolic CHF secondary to #1 3.  Medical noncompliance 4.  Acute on chronic renal insufficiency secondary to #1 5.  Cardiomegaly on chest xray with history of pericardial effusion 6.  Chest pain most likely secondary to #1 with subendocardial ischemia - CP is 3/10 with no radiation to the back and distal pulses are +2 and equal with no widened mediastinum on chest xray therefore unlikely to be aortic dissection.  7.  Hyponatremia 8.  Anemia  PLAN:   1.  Cycle cardiac enzymes 2.  Continue to titrate NTG gtt 3.  IV Labatolol for BP control 4.  2D echo to assess LVF and rule out pericardial effusion and assess proximal aorta 5.  IV Lasix  Quintella Reichert, MD  06/28/2013  12:32 AM

## 2013-06-28 NOTE — Progress Notes (Signed)
PULMONARY  / CRITICAL CARE MEDICINE  Name: Matthew Beard MRN: [id number]  DOB: 1976/10/15    ADMISSION DATE:  06/27/2013 CONSULTATION DATE:  06/28/2013  REFERRING MD :  Dr. Rennis Chris PRIMARY SERVICE: Critical Care Medicine  CHIEF COMPLAINT:  Shortness of breath  BRIEF PATIENT DESCRIPTION: Matthew Beard is a 37 year old male with hypertensive emergency in the setting of medication noncompliance.  SIGNIFICANT EVENTS / STUDIES:  1. Chest x-ray dated 06/28/2013 demonstrates cardiomegaly, pulmonary edema, and small bilateral effusion  LINES / TUBES: 1. Peripheral IV only  CULTURES: 1. None  ANTIBIOTICS: 1. None  HISTORY OF PRESENT ILLNESS:  Matthew Beard is a 37 year old male with hypertension and several past episodes of hypertensive emergency who presents to [facility] tonight for shortness of breath. The shortness of breath began approximately one month ago after he ran out of all of his antihypertensive medications. He particularly notices the shortness of breath when attempting to lie down flat and was unable to sleep for extended periods of time secondary to dyspnea. He denies associated lower short edema or increased abdominal girth. He denied chest pain to me, however, described as 3/10 chest pain to the cardiology consultant. He has occasional headaches and does not have currently. He denies cough, fevers, or chills.    VITAL SIGNS: Temp:  [97.4 F (36.3 C)-97.9 F (36.6 C)] 97.9 F (36.6 C) (08/09 2133) Pulse Rate:  [78-99] 85 (08/10 0007) Resp:  [10-34] 10 (08/10 0007) BP: (178-241)/(40-187) 206/155 mmHg (08/10 0007) SpO2:  [10 %-100 %] 100 % (08/10 0007)  HEMODYNAMICS:    VENTILATOR SETTINGS:    INTAKE / OUTPUT: Intake/Output   None    PHYSICAL EXAMINATION: General: Well-developed well-nourished male in no acute distress  Neuro:   intact HEENT:   sclera anicteric, conjunctiva pink, mucous membranes moist, oropharynx clear Neck:   trachea supple in  midline, no lymphadenopathy or JVD Cardiovascular:  Regular rate rhythm, exaggerated S1/S2, no murmurs rubs or gallops  Lungs:   mild crackles at the bases bilaterally; normal work of breathing Abdomen:   soft, nontender, nondistended, positive bowel sounds Musculoskeletal:   no clubbing cyanosis or edema  Skin:   intact   Recent Labs Lab 06/27/13 2147 06/28/13 0440  NA 126* 130*  K 3.8 3.8  CL 89* 92*  CO2 19 17*  GLUCOSE 87 90  BUN 69* 77*  CREATININE 3.88* 4.06*  CALCIUM 8.9 8.6  MG  --  2.2  PHOS  --  6.9*    Recent Labs Lab 06/27/13 2147  HGB 11.4*  HCT 32.7*  WBC 7.6  PLT 270    Recent Labs Lab 06/27/13 2147  PROBNP 66702.0*  Dg Chest 2 View  06/27/2013   *RADIOLOGY REPORT*  Clinical Data: Shortness of breath for 1 week, worsening today.  CHEST - 2 VIEW  Comparison: 07/23/2011 and 04/15/2011 radiographs; CT 08/21/2009.  Findings: There is progressive cardiomegaly and/or recurrent pericardial effusion.  There is progressive diffuse interstitial prominence throughout both lungs suspicious for pulmonary edema. There are small bilateral pleural effusions.  There is likely some extension of pleural fluid into the left major fissure.  No acute osseous findings are seen.  IMPRESSION: Progressive cardiomegaly, diffuse interstitial prominence and small bilateral pleural effusions most consistent with congestive heart failure.  Atypical infection could have this appearance. Of note, the patient had a pericardial effusion on prior CT; a recurrent pericardial effusion could account for the enlargement of the cardiac silhouette.   Original Report Authenticated By: Carey Bullocks,  M.D.      EKG:  biatrial and left ventricular hypertrophy, likely repolarization abnormality  CXR: Cardiomegaly with diffuse edema and small bilateral effusions   ASSESSMENT / PLAN: Principal Problem:   Hypertensive emergency Active Problems:   Uncontrolled hypertension   Acute kidney injury    Chronic renal insufficiency   PULMONARY A: 1. Acute pulmonary edema, new systolic CHF  P:    Supplemental O2 as needed  Hold diuresis given rising S Cr  CARDIOVASCULAR A:  1. Hypertensive emergency, med non-compliance 2. Chest pain:   3. Diffuse LV hypokinesis by TTE 8/10, ? Hypertensive injury  P:   Nitroglycerin drip for immediate control of blood pressure  Continue outpatient antihypertensive regimen  Goals blood pressure for tonight approximately 160/90  Cardiology evaluation underway  RENAL A:  1.  Acute on chronic kidney injury  P:    Serial BMPs  No urgent need HD but will involve Renal if S Cr does not plateau next 24 hours  GASTROINTESTINAL A: No acute issue:  HEMATOLOGIC A:   1. Anemia: This is most likely anemia of chronic disease given the iron panel from 08/19/2009. This is likely secondary to the patient's chronic kidney disease.  P:   Monitor  INFECTIOUS A:  No acute issue  ENDOCRINE A:    No acute issue  NEUROLOGIC A:   No acute issue  BEST PRACTICE / DISPOSITION Level of Care:  ICU Consultants:  Cardiology Code Status:  Full Diet:  Low-salt DVT Px:  Ambulatory GI Px:  Not needed Skin Integrity:  Intact Social / Family:    TODAY'S SUMMARY:   I have personally obtained a history, examined the patient, evaluated laboratory and imaging results, formulated the assessment and plan and placed orders.  Levy Pupa, MD, PhD 06/28/2013, 11:28 AM [facility] [phone] or if no answer [phone]

## 2013-06-28 NOTE — ED Notes (Signed)
Pt is starting to become drowsy.

## 2013-06-28 NOTE — Progress Notes (Addendum)
Subjective:  37 yo with history of HTN, noncompliance, renal insufficiency, anemia who was admitted yesterday for 1 month SOB  Markedly hypertensive in ER.  Echo with severely dilated LV  LVEF 20 to 25%  Moderate MR   Mod to severe TR.  Patient remains SOB  Sitting in bed  No CP Objective: Filed Vitals:   06/28/13 1130 06/28/13 1145 06/28/13 1200 06/28/13 1215  BP: 181/121  161/145 176/117  Pulse: 80 79 76 77  Temp:      TempSrc:      Resp: 22 25 19 20   Height:      Weight:      SpO2: 98% 93% 90% 99%   Weight change:   Intake/Output Summary (Last 24 hours) at 06/28/13 1229 Last data filed at 06/28/13 1200  Gross per 24 hour  Intake  794.6 ml  Output   1800 ml  Net -1005.4 ml    General: Alert, awake, oriented x3, in no acute distress Neck:  JVP is increased Heart: Regular rate and rhythm, without murmurs, rubs, gallops.  Lungs: Clear to auscultation.  No rales or wheezes. Exemities:  1+ edema.   Neuro: Grossly intact, nonfocal.  Tele:  SR Lab Results: Results for orders placed during the hospital encounter of 06/27/13 (from the past 24 hour(s))  CBC     Status: Abnormal   Collection Time    06/27/13  9:47 PM      Result Value Range   WBC 7.6  4.0 - 10.5 K/uL   RBC 4.00 (*) 4.22 - 5.81 MIL/uL   Hemoglobin 11.4 (*) 13.0 - 17.0 g/dL   HCT 21.3 (*) 08.6 - 57.8 %   MCV 81.8  78.0 - 100.0 fL   MCH 28.5  26.0 - 34.0 pg   MCHC 34.9  30.0 - 36.0 g/dL   RDW 46.9  62.9 - 52.8 %   Platelets 270  150 - 400 K/uL  BASIC METABOLIC PANEL     Status: Abnormal   Collection Time    06/27/13  9:47 PM      Result Value Range   Sodium 126 (*) 135 - 145 mEq/L   Potassium 3.8  3.5 - 5.1 mEq/L   Chloride 89 (*) 96 - 112 mEq/L   CO2 19  19 - 32 mEq/L   Glucose, Bld 87  70 - 99 mg/dL   BUN 69 (*) 6 - 23 mg/dL   Creatinine, Ser 4.13 (*) 0.50 - 1.35 mg/dL   Calcium 8.9  8.4 - 24.4 mg/dL   GFR calc non Af Amer 18 (*) >90 mL/min   GFR calc Af Amer 21 (*) >90 mL/min  PRO B NATRIURETIC  PEPTIDE     Status: Abnormal   Collection Time    06/27/13  9:47 PM      Result Value Range   Pro B Natriuretic peptide (BNP) 66702.0 (*) 0 - 125 pg/mL  TROPONIN I     Status: None   Collection Time    06/28/13 12:50 AM      Result Value Range   Troponin I <0.30  <0.30 ng/mL  MRSA PCR SCREENING     Status: None   Collection Time    06/28/13  2:15 AM      Result Value Range   MRSA by PCR NEGATIVE  NEGATIVE  GLUCOSE, CAPILLARY     Status: None   Collection Time    06/28/13  2:15 AM      Result Value Range  Glucose-Capillary 80  70 - 99 mg/dL  BASIC METABOLIC PANEL     Status: Abnormal   Collection Time    06/28/13  4:40 AM      Result Value Range   Sodium 130 (*) 135 - 145 mEq/L   Potassium 3.8  3.5 - 5.1 mEq/L   Chloride 92 (*) 96 - 112 mEq/L   CO2 17 (*) 19 - 32 mEq/L   Glucose, Bld 90  70 - 99 mg/dL   BUN 77 (*) 6 - 23 mg/dL   Creatinine, Ser 7.82 (*) 0.50 - 1.35 mg/dL   Calcium 8.6  8.4 - 95.6 mg/dL   GFR calc non Af Amer 17 (*) >90 mL/min   GFR calc Af Amer 20 (*) >90 mL/min  MAGNESIUM     Status: None   Collection Time    06/28/13  4:40 AM      Result Value Range   Magnesium 2.2  1.5 - 2.5 mg/dL  PHOSPHORUS     Status: Abnormal   Collection Time    06/28/13  4:40 AM      Result Value Range   Phosphorus 6.9 (*) 2.3 - 4.6 mg/dL  TROPONIN I     Status: None   Collection Time    06/28/13  7:20 AM      Result Value Range   Troponin I <0.30  <0.30 ng/mL    Studies/Results: @RISRSLT24 @  Medications:  Reviewed   @PROBHOSP @  1.  Acute systolic CHF  Patient remains volume overloaded.  Diuresis is limited by BP as well as renal insufficiency.  From yesterday the patient's Cr has bumped to 4.1 With such marginal renal function I would recomm contacting renal service   Repeat BMET later today.    2.  HTN  REmains high  Note plans for nicardipine  WIll follow response  3.  Renal insuff  As noted above  4  Hyponatremia  Na is 130 today.    5.  History on  noncompliance.   LOS: 1 day   Dietrich Pates 06/28/2013, 12:29 PM

## 2013-06-29 ENCOUNTER — Inpatient Hospital Stay (HOSPITAL_COMMUNITY): Payer: Medicaid Other

## 2013-06-29 DIAGNOSIS — I509 Heart failure, unspecified: Secondary | ICD-10-CM

## 2013-06-29 DIAGNOSIS — N184 Chronic kidney disease, stage 4 (severe): Secondary | ICD-10-CM

## 2013-06-29 LAB — CBC
Hemoglobin: 10.8 g/dL — ABNORMAL LOW (ref 13.0–17.0)
MCH: 28.4 pg (ref 26.0–34.0)
MCHC: 35.3 g/dL (ref 30.0–36.0)
MCV: 80.5 fL (ref 78.0–100.0)
Platelets: 311 10*3/uL (ref 150–400)
RBC: 3.8 MIL/uL — ABNORMAL LOW (ref 4.22–5.81)

## 2013-06-29 LAB — BASIC METABOLIC PANEL
BUN: 78 mg/dL — ABNORMAL HIGH (ref 6–23)
Calcium: 8 mg/dL — ABNORMAL LOW (ref 8.4–10.5)
Chloride: 96 mEq/L (ref 96–112)
Creatinine, Ser: 3.75 mg/dL — ABNORMAL HIGH (ref 0.50–1.35)
GFR calc Af Amer: 22 mL/min — ABNORMAL LOW (ref >90)

## 2013-06-29 LAB — MAGNESIUM: Magnesium: 2.3 mg/dL (ref 1.5–2.5)

## 2013-06-29 MED ORDER — HYDRALAZINE HCL 50 MG PO TABS
75.0000 mg | ORAL_TABLET | Freq: Three times a day (TID) | ORAL | Status: DC
  Administered 2013-06-29 – 2013-07-03 (×12): 75 mg via ORAL
  Filled 2013-06-29 (×16): qty 1

## 2013-06-29 MED ORDER — POTASSIUM CHLORIDE CRYS ER 20 MEQ PO TBCR
20.0000 meq | EXTENDED_RELEASE_TABLET | Freq: Once | ORAL | Status: AC
  Administered 2013-06-29: 20 meq via ORAL
  Filled 2013-06-29: qty 1

## 2013-06-29 MED ORDER — CLONIDINE HCL 0.1 MG PO TABS
0.1000 mg | ORAL_TABLET | Freq: Three times a day (TID) | ORAL | Status: DC
  Administered 2013-06-29 (×2): 0.1 mg via ORAL
  Filled 2013-06-29 (×6): qty 1

## 2013-06-29 MED ORDER — AMLODIPINE BESYLATE 10 MG PO TABS
10.0000 mg | ORAL_TABLET | Freq: Every day | ORAL | Status: DC
  Administered 2013-06-29 – 2013-07-04 (×6): 10 mg via ORAL
  Filled 2013-06-29 (×7): qty 1

## 2013-06-29 MED ORDER — LABETALOL HCL 5 MG/ML IV SOLN
10.0000 mg | INTRAVENOUS | Status: DC | PRN
  Filled 2013-06-29: qty 8

## 2013-06-29 MED ORDER — AMLODIPINE BESYLATE 10 MG PO TABS
10.0000 mg | ORAL_TABLET | ORAL | Status: AC
  Administered 2013-06-29: 10 mg via ORAL
  Filled 2013-06-29: qty 1

## 2013-06-29 NOTE — Clinical Documentation Improvement (Signed)
THIS DOCUMENT IS NOT A PERMANENT PART OF THE MEDICAL RECORD  Please update your documentation with the medical record to reflect your response to this query. If you need help knowing how to do this please call [phone].  06/29/13   Dear Dr. Darden Palmer,  In a better effort to capture your patient's severity of illness, reflect appropriate length of stay and utilization of resources, a review of the patient medical record has revealed the following indicators.    Based on your clinical judgment, please clarify and document in a progress note and/or discharge summary the clinical condition associated with the following supporting information: In your Consult Note from 07/02/13, you listed Chronic Kidney Disease in the Assessment/Plan section.  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  The fact that a query is asked, does not imply that any particular answer is desired or expected.  Possible Clinical Conditions?   _______CKD Stage I -  GFR > OR = 90 _______CKD Stage II - GFR 60-80 _______CKD Stage III - GFR 30-59 _______CKD Stage IV - GFR 15-29 _______CKD Stage V - GFR < 15 _______ESRD (End Stage Renal Disease) _______Cannot Clinically determine    Lab:   Creatinine level (baseline and current): 06/29/13  Creatinine 3.75 mg/dL 0/9/81  Creatinine 1.91 mg/dL 02/23/81  Creatinine 9.56 mg/dL 12/20/28  Creatinine 8.65 mg/dL  Calculated GFR:  7/84/69 GFR  22 mL/min   06/27/13  GFR  21 mL/min 01/22/12  GFR  42 mL/min 07/23/11  GFR  >60 mL/min   You may use possible, probable, or suspect with inpatient documentation.  Possible, probable, suspected diagnoses MUST be documented at the time of discharge.  Reviewed: additional documentation in the medical record   Thank You,  Darla Lesches, RN, BSN, CCRN Clinical Documentation Improvement Specialist HIM department--[facility] [phone]

## 2013-06-29 NOTE — Progress Notes (Signed)
Mental status changed with shift assessment. Pt become more confused. Dr. Tyson Alias notified and report to bedside. STAT CT scan order. Continue to monitor.

## 2013-06-29 NOTE — Progress Notes (Signed)
Subjective: Denies CP  Breathing is better Objective: Filed Vitals:   06/29/13 1030 06/29/13 1045 06/29/13 1100 06/29/13 1223  BP: 160/116  150/105   Pulse: 71 70 74   Temp:    97.7 F (36.5 C)  TempSrc:    Oral  Resp: 24 17 20    Height:      Weight:      SpO2: 98% 98% 99%    Weight change: 0 lb (0 kg)  Intake/Output Summary (Last 24 hours) at 06/29/13 1249 Last data filed at 06/29/13 1100  Gross per 24 hour  Intake   1275 ml  Output   3100 ml  Net  -1825 ml    General: Patient resting under blankets  Comfortable Neck:  JVP is increased Heart: Regular rate and rhythm, without murmurs, rubs, gallops.  Lungs: Rel clear. Exemities:  No edema.   Neuro: Grossly intact, nonfocal.   Lab Results: Results for orders placed during the hospital encounter of 06/27/13 (from the past 24 hour(s))  URINE RAPID DRUG SCREEN (HOSP PERFORMED)     Status: None   Collection Time    06/28/13  1:06 PM      Result Value Range   Opiates NONE DETECTED  NONE DETECTED   Cocaine NONE DETECTED  NONE DETECTED   Benzodiazepines NONE DETECTED  NONE DETECTED   Amphetamines NONE DETECTED  NONE DETECTED   Tetrahydrocannabinol NONE DETECTED  NONE DETECTED   Barbiturates NONE DETECTED  NONE DETECTED  BASIC METABOLIC PANEL     Status: Abnormal   Collection Time    06/28/13  6:03 PM      Result Value Range   Sodium 131 (*) 135 - 145 mEq/L   Potassium 3.3 (*) 3.5 - 5.1 mEq/L   Chloride 93 (*) 96 - 112 mEq/L   CO2 22  19 - 32 mEq/L   Glucose, Bld 99  70 - 99 mg/dL   BUN 82 (*) 6 - 23 mg/dL   Creatinine, Ser 1.61 (*) 0.50 - 1.35 mg/dL   Calcium 8.1 (*) 8.4 - 10.5 mg/dL   GFR calc non Af Amer 18 (*) >90 mL/min   GFR calc Af Amer 21 (*) >90 mL/min  PRO B NATRIURETIC PEPTIDE     Status: Abnormal   Collection Time    06/28/13  6:03 PM      Result Value Range   Pro B Natriuretic peptide (BNP) 42206.0 (*) 0 - 125 pg/mL  BASIC METABOLIC PANEL     Status: Abnormal   Collection Time    06/29/13  3:45 AM       Result Value Range   Sodium 132 (*) 135 - 145 mEq/L   Potassium 3.3 (*) 3.5 - 5.1 mEq/L   Chloride 96  96 - 112 mEq/L   CO2 22  19 - 32 mEq/L   Glucose, Bld 120 (*) 70 - 99 mg/dL   BUN 78 (*) 6 - 23 mg/dL   Creatinine, Ser 0.96 (*) 0.50 - 1.35 mg/dL   Calcium 8.0 (*) 8.4 - 10.5 mg/dL   GFR calc non Af Amer 19 (*) >90 mL/min   GFR calc Af Amer 22 (*) >90 mL/min  MAGNESIUM     Status: None   Collection Time    06/29/13  3:45 AM      Result Value Range   Magnesium 2.3  1.5 - 2.5 mg/dL  PHOSPHORUS     Status: None   Collection Time    06/29/13  3:45 AM  Result Value Range   Phosphorus 3.5  2.3 - 4.6 mg/dL  CBC     Status: Abnormal   Collection Time    06/29/13  3:45 AM      Result Value Range   WBC 8.8  4.0 - 10.5 K/uL   RBC 3.80 (*) 4.22 - 5.81 MIL/uL   Hemoglobin 10.8 (*) 13.0 - 17.0 g/dL   HCT 09.8 (*) 11.9 - 14.7 %   MCV 80.5  78.0 - 100.0 fL   MCH 28.4  26.0 - 34.0 pg   MCHC 35.3  30.0 - 36.0 g/dL   RDW 82.9  56.2 - 13.0 %   Platelets 311  150 - 400 K/uL  PRO B NATRIURETIC PEPTIDE     Status: Abnormal   Collection Time    06/29/13  3:45 AM      Result Value Range   Pro B Natriuretic peptide (BNP) 31751.0 (*) 0 - 125 pg/mL    Studies/Results:  Echo:  06/28/13 @RISRSLT24 @Left  ventricle: The cavity size was moderately dilated. There was moderate concentric hypertrophy. The estimated ejection fraction was 20%. Diffuse hypokinesis. There was mild spontaneous echo contrast, indicative of stasis. - Aortic valve: Trivial regurgitation. - Mitral valve: Moderate regurgitation. - Left atrium: The atrium was moderately dilated. - Right ventricle: The cavity size was mildly dilated. Wall thickness was normal. - Right atrium: The atrium was moderately dilated. - Atrial septum: No defect or patent foramen ovale was identified. - Tricuspid valve: Severe regurgitation. - Pulmonary arteries: PA peak pressure: 52mm Hg (S). Impressions:  - The right ventricular  systolic pressure was increased consistent with mild pulmonary hypertension. Echocardiography. M-mode, complete 2D, spectral Doppler, and color Doppler. Weight: Weight: 79.5kg. Weight: 175lb. Patient status: Inpatient. Location: Emergency department.  ------------------------------------------------------------  ------------------------------------------------------------ Left ventricle: The cavity size was moderately dilated. There was moderate concentric hypertrophy. The estimated ejection fraction was 20%. Diffuse hypokinesis. There was mild spontaneous echo contrast, indicative of stasis.  ------------------------------------------------------------ Aortic valve: Mildly thickened, mildly calcified leaflets. Cusp separation was normal. Doppler: Transvalvular velocity was within the normal range. There was no stenosis. Trivial regurgitation.  ------------------------------------------------------------ Aorta: Ascending aorta: The ascending aorta was mildly dilated.  ------------------------------------------------------------ Mitral valve: Structurally normal valve. Leaflet separation was normal. Doppler: Transvalvular velocity was within the normal range. There was no evidence for stenosis. Moderate regurgitation.  ------------------------------------------------------------ Left atrium: The atrium was moderately dilated.  ------------------------------------------------------------ Atrial septum: No defect or patent foramen ovale was identified.  ------------------------------------------------------------ Right ventricle: The cavity size was mildly dilated. Wall thickness was normal. Systolic function was normal.  ------------------------------------------------------------ Tricuspid valve: Structurally normal valve. Leaflet separation was normal. Doppler: Transvalvular velocity was within the normal range. Severe  regurgitation.  ------------------------------------------------------------ Right atrium: The atrium was moderately dilated.  ------------------------------------------------------------ Systemic veins: Inferior vena cava: The vessel was dilated; the respirophasic diameter changes were blunted (< 50%); findings are consistent with elevated central venous pressure.  ------------------------------------------------------------  2D measurements Normal Doppler measurements Normal Left ventricle Main pulmonary LVID ED, 54.4 mm 43-52 artery chord, PLAX Pressure, S 52 mm =30 LVID ES, 46.1 mm 23-38 Hg chord, PLAX Mitral valve FS, chord, 15 % >29 Regurg alias 38.5 cm/s ------ PLAX vel, PISA LVPW, ED 19.7 mm ------ Regurg 7 mm ------ IVS/LVPW 0.71 <1.3 radius, PISA ratio, ED Max regurg 717 cm/s ------ Ventricular septum vel IVS, ED 13.9 mm ------ Regurg VTI 238 cm ------ Aorta ERO, PISA 0.17 cm^2 ------ Root diam, 27 mm ------ Regurg vol, 40 ml ------ ED PISA Left atrium  Tricuspid valve AP dim 52 mm ------ Regurg peak 281 cm/s ------ Right ventricle vel RVID ED, 34.3 mm 19-38 Peak RV-RA 32 mm ------ PLAX gradient, S Hg Systemic veins Estimated 20 mm ------ CVP Hg Right ventricle Pressure, S 52 mm <30 Hg Sa vel, lat 7.68 cm/s ------ ann, tiss DP     Medications: Reviewed   @PROBHOSP @  1.  Hypertensive emergency.  BP is improving  Note changes in oral meds with attempt to wean nicardipine  2.  CHF  Acute on chronic systolic CHF    Echo yesterday shows severe LVH  LVEF 20% Most likely due to HTN.  Diuresis has been limited by renal  Improving  Patient has diuresed some  With nicardipine his symptoms are markedly improved  3.  CRI  Cr a little better than yesterday  Patient should be seen by renal service  Once off of nicardipine and patient can tx out of MICU we can assume care in step down area.    LOS: 2 days   Dietrich Pates 06/29/2013, 12:49 PM

## 2013-06-29 NOTE — Progress Notes (Signed)
PULMONARY  / CRITICAL CARE MEDICINE  Name: Matthew Beard MRN: [id number]  DOB: 07-Jul-1976    ADMISSION DATE:  06/27/2013 CONSULTATION DATE:  06/28/2013  REFERRING MD :  Dr. Rennis Chris PRIMARY SERVICE: Critical Care Medicine  CHIEF COMPLAINT:  Shortness of breath  BRIEF PATIENT DESCRIPTION: [name] is a 37 year old male with hypertensive emergency in the setting of medication noncompliance.  SIGNIFICANT EVENTS / STUDIES:  1. Chest x-ray dated 06/28/2013 demonstrates cardiomegaly, pulmonary edema, and small bilateral effusion 2. 8/11- remain son vaso active BP control  LINES / TUBES: 1. Peripheral IV only  CULTURES: 1. None  ANTIBIOTICS: 1. None  VITAL SIGNS: Temp:  [97.4 F (36.3 C)-97.9 F (36.6 C)] 97.9 F (36.6 C) (08/09 2133) Pulse Rate:  [78-99] 85 (08/10 0007) Resp:  [10-34] 10 (08/10 0007) BP: (178-241)/(40-187) 206/155 mmHg (08/10 0007) SpO2:  [10 %-100 %] 100 % (08/10 0007)   INTAKE / OUTPUT: Intake/Output   None    PHYSICAL EXAMINATION: General: Well-developed well-nourished male in no acute distress  Neuro:   intact HEENT:  jvd up Neck:  jvd up Cardiovascular:  Regular rate rhythm, exaggerated S1/S2, no murmurs rubs or gallops  Lungs:  Coarse bases Abdomen:   soft, nontender, nondistended, positive bowel sounds Musculoskeletal:  No edema Skin:   intact   Recent Labs Lab 06/27/13 2147 06/28/13 0440 06/28/13 1803 06/29/13 0345  NA 126* 130* 131* 132*  K 3.8 3.8 3.3* 3.3*  CL 89* 92* 93* 96  CO2 19 17* 22 22  GLUCOSE 87 90 99 120*  BUN 69* 77* 82* 78*  CREATININE 3.88* 4.06* 3.92* 3.75*  CALCIUM 8.9 8.6 8.1* 8.0*  MG  --  2.2  --  2.3  PHOS  --  6.9*  --  3.5    Recent Labs Lab 06/27/13 2147 06/29/13 0345  HGB 11.4* 10.8*  HCT 32.7* 30.6*  WBC 7.6 8.8  PLT 270 311    Recent Labs Lab 06/27/13 2147 06/28/13 1803 06/29/13 0345  PROBNP 66702.0* 42206.0* 31751.0*  Dg Chest 2 View  06/27/2013   *RADIOLOGY REPORT*  Clinical  Data: Shortness of breath for 1 week, worsening today.  CHEST - 2 VIEW  Comparison: 07/23/2011 and 04/15/2011 radiographs; CT 08/21/2009.  Findings: There is progressive cardiomegaly and/or recurrent pericardial effusion.  There is progressive diffuse interstitial prominence throughout both lungs suspicious for pulmonary edema. There are small bilateral pleural effusions.  There is likely some extension of pleural fluid into the left major fissure.  No acute osseous findings are seen.  IMPRESSION: Progressive cardiomegaly, diffuse interstitial prominence and small bilateral pleural effusions most consistent with congestive heart failure.  Atypical infection could have this appearance. Of note, the patient had a pericardial effusion on prior CT; a recurrent pericardial effusion could account for the enlargement of the cardiac silhouette.   Original Report Authenticated By: Carey Bullocks, M.D.   Dg Chest Port 1 View  06/29/2013   *RADIOLOGY REPORT*  Clinical Data: CHF; shortness of breath  PORTABLE CHEST - 1 VIEW  Comparison: Chest radiograph 06/28/2013  Findings: Unchanged cardiomegaly.  Slight interval increase in perihilar and mid/lower lung heterogeneous pulmonary opacities.  No definite pleural effusion or pneumothorax.  IMPRESSION: Cardiomegaly with increasing perihilar and mid/lower lung heterogeneous opacities, likely pulmonary edema.   Original Report Authenticated By: Annia Belt, M.D   Dg Chest Port 1 View  06/28/2013   *RADIOLOGY REPORT*  Clinical Data: 37 year old male with shortness of breath.  PORTABLE CHEST - 1 VIEW  Comparison: 06/27/2013  and prior chest radiographs  Findings: Enlargement of cardiopericardial silhouette is noted with pulmonary vascular congestion. Interstitial pulmonary edema has decreased. Very small bilateral pleural effusions are noted. Focal opacity along the lateral mid - lower left hemithorax is noted and could represent pleural fluid. There is no evidence of pneumothorax.   IMPRESSION: Decreased pulmonary edema otherwise stable chest.   Original Report Authenticated By: Harmon Pier, M.D.      pcxr 8/11- edema  ASSESSMENT / PLAN: Principal Problem:   Hypertensive emergency Active Problems:   Uncontrolled hypertension   Acute kidney injury   Chronic renal insufficiency   PULMONARY A: 1. Acute pulmonary edema, new systolic CHF  P:    Supplemental O2 as needed  Continued to have neg balance with improved crt  CARDIOVASCULAR A:  1. Hypertensive emergency, med non-compliance 2. Chest pain:   3. Diffuse LV hypokinesis by TTE 8/10, ? Hypertensive injury  P:   Maintain nicardipine drip to MAP goals 140-160, slight further lower with days passing with control  Hydrazine to 75 q8h   No HCTZ with arf  Coreg unable to increase , mild brady, norvasc continue  Cardiology evaluation underway  Likely will need clonidine to dc drip  RENAL A:  1.  Acute on chronic kidney injury-improved, hypokalemia  P:    Hold off renal eval as improved with neg baalnce  -continue to hold lasix and allow neg balance, chem in am   GASTROINTESTINAL A: No acute issue: diet ensure  HEMATOLOGIC A:   1. Anemia: This is most likely anemia of chronic disease given the iron panel from 08/19/2009. This is likely secondary to the patient's chronic kidney disease.  P:   Monitor, add scd as limited ambulation  INFECTIOUS A:  No acute issue  ENDOCRINE A:    No acute issue  NEUROLOGIC A:   No acute issue   TODAY'S SUMMARY: goal to dc drip, add clonidine, increase hydralazine Ccm time 30 min   Mcarthur Rossetti. Tyson Alias, MD, FACP Pgr: [phone]  Pulmonary & Critical Care

## 2013-06-30 ENCOUNTER — Encounter (HOSPITAL_COMMUNITY): Payer: Self-pay | Admitting: *Deleted

## 2013-06-30 ENCOUNTER — Inpatient Hospital Stay (HOSPITAL_COMMUNITY): Payer: Medicaid Other

## 2013-06-30 DIAGNOSIS — D649 Anemia, unspecified: Secondary | ICD-10-CM

## 2013-06-30 LAB — CBC WITH DIFFERENTIAL/PLATELET
Basophils Absolute: 0 10*3/uL (ref 0.0–0.1)
Basophils Relative: 0 % (ref 0–1)
Eosinophils Absolute: 0.1 10*3/uL (ref 0.0–0.7)
Eosinophils Relative: 2 % (ref 0–5)
HCT: 30.4 % — ABNORMAL LOW (ref 39.0–52.0)
Hemoglobin: 10.4 g/dL — ABNORMAL LOW (ref 13.0–17.0)
MCH: 28.7 pg (ref 26.0–34.0)
MCHC: 34.2 g/dL (ref 30.0–36.0)
Monocytes Absolute: 0.6 10*3/uL (ref 0.1–1.0)
Monocytes Relative: 9 % (ref 3–12)
RDW: 14.8 % (ref 11.5–15.5)

## 2013-06-30 LAB — BASIC METABOLIC PANEL
BUN: 69 mg/dL — ABNORMAL HIGH (ref 6–23)
Chloride: 99 mEq/L (ref 96–112)
Creatinine, Ser: 3.48 mg/dL — ABNORMAL HIGH (ref 0.50–1.35)
Glucose, Bld: 107 mg/dL — ABNORMAL HIGH (ref 70–99)
Potassium: 3.3 mEq/L — ABNORMAL LOW (ref 3.5–5.1)

## 2013-06-30 MED ORDER — POTASSIUM CHLORIDE CRYS ER 20 MEQ PO TBCR
40.0000 meq | EXTENDED_RELEASE_TABLET | Freq: Once | ORAL | Status: AC
  Administered 2013-06-30: 40 meq via ORAL
  Filled 2013-06-30: qty 2

## 2013-06-30 MED ORDER — CLONIDINE HCL 0.2 MG PO TABS
0.2000 mg | ORAL_TABLET | Freq: Three times a day (TID) | ORAL | Status: DC
  Administered 2013-06-30 – 2013-07-04 (×13): 0.2 mg via ORAL
  Filled 2013-06-30 (×16): qty 1

## 2013-06-30 MED ORDER — ASPIRIN EC 325 MG PO TBEC
325.0000 mg | DELAYED_RELEASE_TABLET | Freq: Every day | ORAL | Status: DC
  Administered 2013-06-30 – 2013-07-04 (×5): 325 mg via ORAL
  Filled 2013-06-30 (×6): qty 1

## 2013-06-30 NOTE — Care Management Note (Signed)
    Page 1 of 1   06/30/2013     2:52:32 PM   CARE MANAGEMENT NOTE 06/30/2013  Patient:  Matthew Beard, Matthew Beard   Account Number:  [id number]  Date Initiated:  06/29/2013  Documentation initiated by:  [facility]  Subjective/Objective Assessment:   Hypertensive crisis. On nicardipine drip.     Action/Plan:   Anticipated DC Date:  07/02/2013   Anticipated DC Plan:  HOME W HOME HEALTH SERVICES      DC Planning Services  CM consult  Medication Assistance      Choice offered to / List presented to:             Status of service:  In process, will continue to follow Medicare Important Message given?   (If response is "NO", the following Medicare IM given date fields will be blank) Date Medicare IM given:   Date Additional Medicare IM given:    Discharge Disposition:    Per UR Regulation:  Reviewed for med. necessity/level of care/duration of stay  If discussed at Long Length of Stay Meetings, dates discussed:    Comments:  ContactBeckie Salts [phone]                 [facility] Brother [phone]  06-30-13 2:20pm Avie Arenas, RNBSN [phone] Talked with patient and SO Judeth Cornfield in room. Patient sleepy - hard to stay awake to answer questions, when does gets SOB.  States has prescriptions for meds but just no money to fill.  States can afford 4 dollar prescriptions at [facility].  Financial counselor working with them on disability -  but will take more than 30 days.  Can assist for a month with meds on discharge but will still need something longer until disability picks up.  If possible - 4 dollar meds at [facility] would be beneficial on discharge.

## 2013-06-30 NOTE — Progress Notes (Signed)
PULMONARY  / CRITICAL CARE MEDICINE  Name: Matthew Beard MRN: [id number]  DOB: Jan 07, 1976    ADMISSION DATE:  06/27/2013 CONSULTATION DATE:  06/28/2013  REFERRING MD :  Dr. Rennis Chris PRIMARY SERVICE: Critical Care Medicine  CHIEF COMPLAINT:  Shortness of breath  BRIEF PATIENT DESCRIPTION: Matthew Beard is a 37 year old male with hypertensive emergency in the setting of medication noncompliance.  SIGNIFICANT EVENTS / STUDIES:  1. Chest x-ray dated 06/28/2013 demonstrates cardiomegaly, pulmonary edema, and small bilateral effusion 2. 8/11- remain son vaso active BP control 3.   Acute mental status decline on 06/29/13, Stat CT reveals marked interval progression of periventicular white matter dz with small lacunar infarct adjacent to the frontal horn of the left lateral ventricle age unknown.   LINES / TUBES: 1. Peripheral IV only  CULTURES: 1. None  ANTIBIOTICS: 1. None  VITAL SIGNS: Temp:  [97.4 F (36.3 C)-97.9 F (36.6 C)] 97.9 F (36.6 C) (08/09 2133) Pulse Rate:  [78-99] 85 (08/10 0007) Resp:  [10-34] 10 (08/10 0007) BP: (178-241)/(40-187) 206/155 mmHg (08/10 0007) SpO2:  [10 %-100 %] 100 % (08/10 0007)   INTAKE / OUTPUT: Patient -1.2 L in the last 24 hours.    PHYSICAL EXAMINATION: General: Well-developed well-nourished male in no acute distress  Neuro:   intact HEENT:  +JVD Neck:  Trachea midline Cardiovascular:  RRR no MRG noted Lungs:  CTAB Abdomen:   soft, nontender, nondistended, positive bowel sounds Musculoskeletal:  No edema Skin:   intact   Recent Labs Lab 06/27/13 2147 06/28/13 0440 06/28/13 1803 06/29/13 0345 06/30/13 0235  NA 126* 130* 131* 132* 134*  K 3.8 3.8 3.3* 3.3* 3.3*  CL 89* 92* 93* 96 99  CO2 19 17* 22 22 22   GLUCOSE 87 90 99 120* 107*  BUN 69* 77* 82* 78* 69*  CREATININE 3.88* 4.06* 3.92* 3.75* 3.48*  CALCIUM 8.9 8.6 8.1* 8.0* 8.0*  MG  --  2.2  --  2.3  --   PHOS  --  6.9*  --  3.5  --     Recent Labs Lab  06/27/13 2147 06/29/13 0345 06/30/13 0235  HGB 11.4* 10.8* 10.4*  HCT 32.7* 30.6* 30.4*  WBC 7.6 8.8 7.3  PLT 270 311 305    Recent Labs Lab 06/27/13 2147 06/28/13 1803 06/29/13 0345  PROBNP 66702.0* 42206.0* 31751.0*  Ct Head Wo Contrast  06/29/2013   *RADIOLOGY REPORT*  Clinical Data: Worsening confusion.  Hypertensive crisis.  CT HEAD WITHOUT CONTRAST  Technique:  Contiguous axial images were obtained from the base of the skull through the vertex without contrast.  Comparison: 02/03/2009.  Findings: No mass lesion, mass effect, midline shift, hydrocephalus, hemorrhage.  No acute territorial cortical ischemia/infarct.  There are new lacunar infarct adjacent to the left frontal horn of the lateral ventricle, with more pronounced chronic ischemic white matter disease.  This shows significant progression compared to the prior exam of 02/03/2009.  There are no definite watershed infarcts.  The calvarium appears intact. Mastoid air cells clear.  Hypoplastic left frontal sinus.  New right thalamic lacunar infarct is age indeterminant.  IMPRESSION: Marked interval progression of periventricular white matter disease with small lacunar infarct adjacent to the frontal horn of the left lateral ventricle. The white matter disease is probably chronic ischemic but is nonspecific and could be associated with vasculitis, migraine headache, or demyelinating disease.  Age indeterminant right thalamic lacunar infarct.   Original Report Authenticated By: Andreas Newport, M.D.   Dg Chest [facility]  06/30/2013   *RADIOLOGY REPORT*  Clinical Data: Shortness of breath.  Evaluate for pulmonary edema.  PORTABLE CHEST - 1 VIEW  Comparison: Chest x-ray 06/29/2013.  Findings: Lung volumes are normal. There is cephalization of the pulmonary vasculature and slight indistinctness of the interstitial markings suggestive of mild pulmonary edema.  Mild cardiomegaly. Probable trace left pleural effusion.  Upper mediastinal  contours are within normal limits.  IMPRESSION: 1.  Findings, as above, suggestive of mild congestive heart failure.   Original Report Authenticated By: Trudie Reed, M.D.   Dg Chest Port 1 View  06/29/2013   *RADIOLOGY REPORT*  Clinical Data: CHF; shortness of breath  PORTABLE CHEST - 1 VIEW  Comparison: Chest radiograph 06/28/2013  Findings: Unchanged cardiomegaly.  Slight interval increase in perihilar and mid/lower lung heterogeneous pulmonary opacities.  No definite pleural effusion or pneumothorax.  IMPRESSION: Cardiomegaly with increasing perihilar and mid/lower lung heterogeneous opacities, likely pulmonary edema.   Original Report Authenticated By: Annia Belt, M.D   Dg Chest Port 1 View  06/28/2013   *RADIOLOGY REPORT*  Clinical Data: 37 year old male with shortness of breath.  PORTABLE CHEST - 1 VIEW  Comparison: 06/27/2013 and prior chest radiographs  Findings: Enlargement of cardiopericardial silhouette is noted with pulmonary vascular congestion. Interstitial pulmonary edema has decreased. Very small bilateral pleural effusions are noted. Focal opacity along the lateral mid - lower left hemithorax is noted and could represent pleural fluid. There is no evidence of pneumothorax.  IMPRESSION: Decreased pulmonary edema otherwise stable chest.   Original Report Authenticated By: Harmon Pier, M.D.   CXR: Mild pulmonary edema, mild cardiomegaly. Trace left pleural effusion.   ASSESSMENT / PLAN: Principal Problem:   Hypertensive emergency Active Problems:   Acute on Chronic CHF    Uncontrolled hypertension   Acute kidney injury   Chronic renal insufficiency   Mild pulmonary htn   PULMONARY A: 1. Acute pulmonary edema 2. new systolic CHF 3. Mild pulmonary htn  P:    Supplemental O2 as needed  Continued to have neg balance with improved crt  No daily pcxr required  CARDIOVASCULAR A:  1. Hypertensive emergency, med non-compliance 2. Chest pain 3. Diffuse LV hypokinesis by  Echo 8/10, ? Hypertensive injury  P:   Cardiology consult on 8/10  Echo 8/10 reveals EJ 20%, mitral regurd, tricuspid regurg, and mild pulm htn with peak pressure of 52 mmHg  Patient off cardene drip  Now taking amlodipine, carvedilol, clonidine, and hydralazine ( maximize)  Diuresis limited by renal function, although improved last 24 hrs  RENAL A:  1.  Acute on chronic kidney injury-improved, hypokalemia, hyponatremia   P:    Continue to hold lasix,   Follow renal function with serial AM BMP. Hold on renal eval as patient is improving   Allow for negative balance.   K supp  GASTROINTESTINAL A: Heart Diet   HEMATOLOGIC A:   1. Anemia of chronic dz secondary to CKD  P:   Monitor H&H with CBCs  SCD's for DVT prophylaxis   INFECTIOUS A:  No acute issue P: Continue to follow WBC trend and fever curve   ENDOCRINE A:    No acute issue  NEUROLOGIC A:   AMS on 8/1, stat CT reveals small lacunar infarct of unknown age   Intermittent poor autoregulation from HTN likely  P:  - Monitor for further mental status changes  -Consider MRI brain Would assess carotids -Ct re assuring -may need neuro evaluation -consider asa  Summary: Patient off Cardine drip.  There was an acute decline in mental status overnight. CT showed small lacunar infract of unknown age. KCl replaced this AM. Renal function continuing to improve. Allow for negative balance. Patient may be ready for transfer to stepdown.  May need mri, assess carotids, asa addition  Jennifer Little PA-S  I have fully examined this patient and agree with above findings.    And edite dinufll  Once leaves ICU, cards accpeted pt , wold sign off  Mcarthur Rossetti. Tyson Alias, MD, FACP Pgr: [phone] [facility]

## 2013-06-30 NOTE — Progress Notes (Signed)
[facility] ADULT ICU REPLACEMENT PROTOCOL FOR AM LAB REPLACEMENT ONLY  The patient does not apply for the [facility] Adult ICU Electrolyte Replacment Protocol based on the criteria listed below:   1. Is GFR >/= 40 ml/min? no  Patient's GFR today is 24 2. Is BUN < 60 mg/dL? no  Patient's BUN today is 69 3. Abnormal electrolyte(s): K-3.3   Llana Aliment 06/30/2013 4:03 AM

## 2013-06-30 NOTE — Progress Notes (Signed)
Subjective: Denies CP  Breathing is better  Objective: Filed Vitals:   06/30/13 0600 06/30/13 0700 06/30/13 0800 06/30/13 0815  BP: 156/113 155/91  159/96  Pulse: 73 70  74  Temp:   97.7 F (36.5 C)   TempSrc:   Oral   Resp: 18     Height:      Weight:      SpO2: 99% 100%  100%   Weight change: 10.6 oz (0.3 kg)  Intake/Output Summary (Last 24 hours) at 06/30/13 0855 Last data filed at 06/30/13 0800  Gross per 24 hour  Intake 1621.67 ml  Output   3000 ml  Net -1378.33 ml    General: Patient resting under blankets  Comfortable Neck:  JVP is increased Heart: Regular rate and rhythm, without murmurs, rubs, gallops.  Lungs: Rel clear. Exemities:  No edema.   Neuro: Grossly intact, nonfocal.   Lab Results: Results for orders placed during the hospital encounter of 06/27/13 (from the past 24 hour(s))  BASIC METABOLIC PANEL     Status: Abnormal   Collection Time    06/30/13  2:35 AM      Result Value Range   Sodium 134 (*) 135 - 145 mEq/L   Potassium 3.3 (*) 3.5 - 5.1 mEq/L   Chloride 99  96 - 112 mEq/L   CO2 22  19 - 32 mEq/L   Glucose, Bld 107 (*) 70 - 99 mg/dL   BUN 69 (*) 6 - 23 mg/dL   Creatinine, Ser 1.61 (*) 0.50 - 1.35 mg/dL   Calcium 8.0 (*) 8.4 - 10.5 mg/dL   GFR calc non Af Amer 21 (*) >90 mL/min   GFR calc Af Amer 24 (*) >90 mL/min  CBC WITH DIFFERENTIAL     Status: Abnormal   Collection Time    06/30/13  2:35 AM      Result Value Range   WBC 7.3  4.0 - 10.5 K/uL   RBC 3.63 (*) 4.22 - 5.81 MIL/uL   Hemoglobin 10.4 (*) 13.0 - 17.0 g/dL   HCT 09.6 (*) 04.5 - 40.9 %   MCV 83.7  78.0 - 100.0 fL   MCH 28.7  26.0 - 34.0 pg   MCHC 34.2  30.0 - 36.0 g/dL   RDW 81.1  91.4 - 78.2 %   Platelets 305  150 - 400 K/uL   Neutrophils Relative % 72  43 - 77 %   Neutro Abs 5.3  1.7 - 7.7 K/uL   Lymphocytes Relative 18  12 - 46 %   Lymphs Abs 1.3  0.7 - 4.0 K/uL   Monocytes Relative 9  3 - 12 %   Monocytes Absolute 0.6  0.1 - 1.0 K/uL   Eosinophils Relative 2  0 -  5 %   Eosinophils Absolute 0.1  0.0 - 0.7 K/uL   Basophils Relative 0  0 - 1 %   Basophils Absolute 0.0  0.0 - 0.1 K/uL    Studies/Results:  Echo:  06/28/13  Left ventricle: The cavity size was moderately dilated. There was moderate concentric hypertrophy. The estimated ejection fraction was 20%. Diffuse hypokinesis. There was mild spontaneous echo contrast, indicative of stasis. - Aortic valve: Trivial regurgitation. - Mitral valve: Moderate regurgitation. - Left atrium: The atrium was moderately dilated. - Right ventricle: The cavity size was mildly dilated. Wall thickness was normal. - Right atrium: The atrium was moderately dilated. - Atrial septum: No defect or patent foramen ovale was identified. - Tricuspid valve: Severe  regurgitation. - Pulmonary arteries: PA peak pressure: 52mm Hg (S). Impressions:  - The right ventricular systolic pressure was increased consistent with mild pulmonary hypertension.  Medications: Reviewed  1.  Hypertensive emergency.  Pt has a hx of long-standing HTN and has been noncompliant ( runs out of meds)  BP is improving and he is presently off the nicardipine drip.  He is feeling much better. Current meds are:  Amlodipine 10 a day Coreg 12. 5 bid Clonidine 0.1 TID Lasix Hydralazine 75 Q 8  He has continued to diurese ( received a dose of lasix 3 days ago).   Will increase clonidine to 0.2 mg TID.  He should be able to transfer out of the ICU today.  We can assume care once he is in step down / tele     2.  CHF  Acute on chronic systolic CHF    Echo yesterday shows severe LVH  LVEF 20% Most likely due to HTN.  Diuresis has been limited by renal  Improving  Patient has diuresed some  With improved BP  his symptoms are markedly improved  3.  CRI  Cr a little better than yesterday  Patient should be seen by renal service    LOS: 3 days   Elyn Aquas. 06/30/2013, 8:55 AM

## 2013-07-01 ENCOUNTER — Inpatient Hospital Stay (HOSPITAL_COMMUNITY): Payer: Medicaid Other

## 2013-07-01 DIAGNOSIS — N189 Chronic kidney disease, unspecified: Secondary | ICD-10-CM

## 2013-07-01 DIAGNOSIS — I6350 Cerebral infarction due to unspecified occlusion or stenosis of unspecified cerebral artery: Secondary | ICD-10-CM

## 2013-07-01 LAB — CBC
HCT: 30.8 % — ABNORMAL LOW (ref 39.0–52.0)
Hemoglobin: 10.3 g/dL — ABNORMAL LOW (ref 13.0–17.0)
MCH: 28.3 pg (ref 26.0–34.0)
MCV: 84.6 fL (ref 78.0–100.0)
RBC: 3.64 MIL/uL — ABNORMAL LOW (ref 4.22–5.81)

## 2013-07-01 LAB — BASIC METABOLIC PANEL
BUN: 56 mg/dL — ABNORMAL HIGH (ref 6–23)
CO2: 24 mEq/L (ref 19–32)
Calcium: 8.3 mg/dL — ABNORMAL LOW (ref 8.4–10.5)
Glucose, Bld: 96 mg/dL (ref 70–99)
Sodium: 133 mEq/L — ABNORMAL LOW (ref 135–145)

## 2013-07-01 MED ORDER — POTASSIUM CHLORIDE CRYS ER 20 MEQ PO TBCR
20.0000 meq | EXTENDED_RELEASE_TABLET | Freq: Two times a day (BID) | ORAL | Status: AC
  Administered 2013-07-01 (×2): 20 meq via ORAL
  Filled 2013-07-01 (×2): qty 1

## 2013-07-01 NOTE — Progress Notes (Signed)
Patient ID: GARO HEIDELBERG, male   DOB: 01/25/1976, 37 y.o.   MRN: [id number]   Patient Name: Matthew Beard Date of Encounter: 07/01/2013    SUBJECTIVE  Breathing better. No CP. Admits to not checking his BP at home.Assumes it is under control because of lack of headaches.  CURRENT MEDS . amLODipine  10 mg Oral Daily  . aspirin EC  325 mg Oral Daily  . carvedilol  12.5 mg Oral BID WC  . cloNIDine  0.2 mg Oral TID  . furosemide  40 mg Intravenous Once  . hydrALAZINE  75 mg Oral Q8H  . potassium chloride  20 mEq Oral BID    OBJECTIVE  Filed Vitals:   07/01/13 1100 07/01/13 1200 07/01/13 1257 07/01/13 1300  BP: 132/75 129/94  136/87  Pulse: 63 59  66  Temp:  97.6 F (36.4 C) 97.4 F (36.3 C)   TempSrc:  Oral Oral   Resp: 12 0  10  Height:      Weight:      SpO2: 100% 98%  96%    Intake/Output Summary (Last 24 hours) at 07/01/13 1321 Last data filed at 07/01/13 1000  Gross per 24 hour  Intake    420 ml  Output    700 ml  Net   -280 ml   Filed Weights   06/29/13 0500 06/30/13 0500 07/01/13 0453  Weight: 175 lb 0.7 oz (79.4 kg) 175 lb 11.3 oz (79.7 kg) 179 lb 3.7 oz (81.3 kg)    PHYSICAL EXAM  General: Pleasant, NAD. Neuro: Alert and oriented X 3. Moves all extremities spontaneously. Psych: Normal affect. HEENT:  Normal  Neck: Supple without bruits or JVD. Lungs:  Resp regular and unlabored, CTA. Heart: RRR no s3, s4, or murmurs. Abdomen: Soft, non-tender, non-distended, BS + x 4.  Extremities: No clubbing, cyanosis or edema. DP/PT/Radials 2+ and equal bilaterally.  Accessory Clinical Findings  CBC  Recent Labs  06/30/13 0235 07/01/13 0406  WBC 7.3 4.8  NEUTROABS 5.3  --   HGB 10.4* 10.3*  HCT 30.4* 30.8*  MCV 83.7 84.6  PLT 305 313   Basic Metabolic Panel  Recent Labs  06/29/13 0345 06/30/13 0235 07/01/13 0406  NA 132* 134* 133*  K 3.3* 3.3* 3.4*  CL 96 99 99  CO2 22 22 24   GLUCOSE 120* 107* 96  BUN 78* 69* 56*  CREATININE 3.75*  3.48* 3.19*  CALCIUM 8.0* 8.0* 8.3*  MG 2.3  --   --   PHOS 3.5  --   --    Liver Function Tests No results found for this basename: AST, ALT, ALKPHOS, BILITOT, PROT, ALBUMIN,  in the last 72 hours No results found for this basename: LIPASE, AMYLASE,  in the last 72 hours Cardiac Enzymes No results found for this basename: CKTOTAL, CKMB, CKMBINDEX, TROPONINI,  in the last 72 hours BNP No components found with this basename: POCBNP,  D-Dimer No results found for this basename: DDIMER,  in the last 72 hours Hemoglobin A1C No results found for this basename: HGBA1C,  in the last 72 hours Fasting Lipid Panel No results found for this basename: CHOL, HDL, LDLCALC, TRIG, CHOLHDL, LDLDIRECT,  in the last 72 hours Thyroid Function Tests No results found for this basename: TSH, T4TOTAL, FREET3, T3FREE, THYROIDAB,  in the last 72 hours  TELE NSR  ECG   Radiology/Studies  Dg Chest 2 View  06/27/2013   *RADIOLOGY REPORT*  Clinical Data: Shortness of breath for 1  week, worsening today.  CHEST - 2 VIEW  Comparison: 07/23/2011 and 04/15/2011 radiographs; CT 08/21/2009.  Findings: There is progressive cardiomegaly and/or recurrent pericardial effusion.  There is progressive diffuse interstitial prominence throughout both lungs suspicious for pulmonary edema. There are small bilateral pleural effusions.  There is likely some extension of pleural fluid into the left major fissure.  No acute osseous findings are seen.  IMPRESSION: Progressive cardiomegaly, diffuse interstitial prominence and small bilateral pleural effusions most consistent with congestive heart failure.  Atypical infection could have this appearance. Of note, the patient had a pericardial effusion on prior CT; a recurrent pericardial effusion could account for the enlargement of the cardiac silhouette.   Original Report Authenticated By: Carey Bullocks, M.D.   Ct Head Wo Contrast  06/29/2013   *RADIOLOGY REPORT*  Clinical Data:  Worsening confusion.  Hypertensive crisis.  CT HEAD WITHOUT CONTRAST  Technique:  Contiguous axial images were obtained from the base of the skull through the vertex without contrast.  Comparison: 02/03/2009.  Findings: No mass lesion, mass effect, midline shift, hydrocephalus, hemorrhage.  No acute territorial cortical ischemia/infarct.  There are new lacunar infarct adjacent to the left frontal horn of the lateral ventricle, with more pronounced chronic ischemic white matter disease.  This shows significant progression compared to the prior exam of 02/03/2009.  There are no definite watershed infarcts.  The calvarium appears intact. Mastoid air cells clear.  Hypoplastic left frontal sinus.  New right thalamic lacunar infarct is age indeterminant.  IMPRESSION: Marked interval progression of periventricular white matter disease with small lacunar infarct adjacent to the frontal horn of the left lateral ventricle. The white matter disease is probably chronic ischemic but is nonspecific and could be associated with vasculitis, migraine headache, or demyelinating disease.  Age indeterminant right thalamic lacunar infarct.   Original Report Authenticated By: Andreas Newport, M.D.   Dg Chest Port 1 View  07/01/2013   *RADIOLOGY REPORT*  Clinical Data: Shortness of breath.  Evaluate for pulmonary edema.  PORTABLE CHEST - 1 VIEW  Comparison: Chest x-ray 06/30/2013.  Findings: Lung volumes are normal.  No acute consolidative airspace disease.  No pleural effusions.  Cephalization of the pulmonary vasculature, without frank pulmonary edema.  Mild cardiomegaly. Upper mediastinal contours are within normal limits.  IMPRESSION: 1.  Mild cardiomegaly with pulmonary venous congestion, but no frank pulmonary edema.   Original Report Authenticated By: Trudie Reed, M.D.   Dg Chest Port 1 View  06/30/2013   *RADIOLOGY REPORT*  Clinical Data: Shortness of breath.  Evaluate for pulmonary edema.  PORTABLE CHEST - 1 VIEW   Comparison: Chest x-ray 06/29/2013.  Findings: Lung volumes are normal. There is cephalization of the pulmonary vasculature and slight indistinctness of the interstitial markings suggestive of mild pulmonary edema.  Mild cardiomegaly. Probable trace left pleural effusion.  Upper mediastinal contours are within normal limits.  IMPRESSION: 1.  Findings, as above, suggestive of mild congestive heart failure.   Original Report Authenticated By: Trudie Reed, M.D.   Dg Chest Port 1 View  06/29/2013   *RADIOLOGY REPORT*  Clinical Data: CHF; shortness of breath  PORTABLE CHEST - 1 VIEW  Comparison: Chest radiograph 06/28/2013  Findings: Unchanged cardiomegaly.  Slight interval increase in perihilar and mid/lower lung heterogeneous pulmonary opacities.  No definite pleural effusion or pneumothorax.  IMPRESSION: Cardiomegaly with increasing perihilar and mid/lower lung heterogeneous opacities, likely pulmonary edema.   Original Report Authenticated By: Annia Belt, M.D   Dg Chest Port 1 View  06/28/2013   *  RADIOLOGY REPORT*  Clinical Data: 37 year old male with shortness of breath.  PORTABLE CHEST - 1 VIEW  Comparison: 06/27/2013 and prior chest radiographs  Findings: Enlargement of cardiopericardial silhouette is noted with pulmonary vascular congestion. Interstitial pulmonary edema has decreased. Very small bilateral pleural effusions are noted. Focal opacity along the lateral mid - lower left hemithorax is noted and could represent pleural fluid. There is no evidence of pneumothorax.  IMPRESSION: Decreased pulmonary edema otherwise stable chest.   Original Report Authenticated By: Harmon Pier, M.D.    ASSESSMENT AND PLAN  Principal Problem:   Hypertensive emergency Active Problems:   Uncontrolled hypertension   Anemia   Chronic renal insufficiency   Acute kidney injury   Acute systolic CHF (congestive heart failure)   Echo in 2010 showed severe LVH with EF of 70%. He now has an EF of 20% with LV  dilatation and moderate LVH. 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 I have discussed with patient and he now has good understanding of the importance of taking his meds, following low salt diet and checking his BP at home. He does not need a cardiac cath. Meds reviewed. Bp under pretty good control today.   Signed, Valera Castle MD

## 2013-07-01 NOTE — Progress Notes (Signed)
VASCULAR LAB PRELIMINARY  PRELIMINARY  PRELIMINARY  PRELIMINARY  Carotid duplex  completed.    Preliminary report:  Bilateral:  1-39% ICA stenosis.  Vertebral artery flow is antegrade.      Lakya Schrupp, RVT 07/01/2013, 11:27 AM

## 2013-07-01 NOTE — Progress Notes (Signed)
Pharmacist Heart Failure Core Measure Documentation  Assessment: Matthew Beard has an EF documented as 20% on 06/28/13 by ECHO.  Rationale: Heart failure patients with left ventricular systolic dysfunction (LVSD) and an EF < 40% should be prescribed an angiotensin converting enzyme inhibitor (ACEI) or angiotensin receptor blocker (ARB) at discharge unless a contraindication is documented in the medical record.  This patient is not currently on an ACEI or ARB for HF.  This note is being placed in the record in order to provide documentation that a contraindication to the use of these agents is present for this encounter.  ACE Inhibitor or Angiotensin Receptor Blocker is contraindicated (specify all that apply)  []   ACEI allergy AND ARB allergy []   Angioedema []   Moderate or severe aortic stenosis []   Hyperkalemia []   Hypotension []   Renal artery stenosis [x]   Worsening renal function, preexisting renal disease or dysfunction  Patient currently recovering from acute kidney injury and renal function still not stable. Left comment for MD to consider initiating ACEI/ARB when renal function stabilizes.   [name] C. Doralene Glanz, PharmD Clinical Pharmacist-Resident Pager: [phone] Pharmacy: [phone] 07/01/2013 1:42 PM

## 2013-07-01 NOTE — Progress Notes (Signed)
PULMONARY  / CRITICAL CARE MEDICINE  Name: Matthew Beard MRN: [id number]  DOB: [birth year]    ADMISSION DATE:  06/27/2013 CONSULTATION DATE:  06/28/2013  REFERRING MD :  Dr. Rennis Chris PRIMARY SERVICE: Critical Care Medicine  CHIEF COMPLAINT:  Shortness of breath  BRIEF PATIENT DESCRIPTION: Matthew Beard is a 37 year old male with hypertensive emergency in the setting of medication noncompliance.  SIGNIFICANT EVENTS / STUDIES:  1. Chest x-ray dated 06/28/2013 demonstrates cardiomegaly, pulmonary edema, and small bilateral effusion 2. 8/11- remain son vaso active BP control 3.   Acute mental status decline on 06/29/13, Stat CT reveals marked interval progression of periventicular white matter dz with small lacunar infarct adjacent to the frontal horn of the left lateral ventricle age unknown.   LINES / TUBES: 1. Peripheral IV only  CULTURES: 1. None  ANTIBIOTICS: 1. None  VITAL SIGNS: Temp:  [97.4 F (36.3 C)-97.9 F (36.6 C)] 97.9 F (36.6 C) (08/09 2133) Pulse Rate:  [78-99] 85 (08/10 0007) Resp:  [10-34] 10 (08/10 0007) BP: (178-241)/(40-187) 206/155 mmHg (08/10 0007) SpO2:  [10 %-100 %] 100 % (08/10 0007)   INTAKE / OUTPUT: Negative balance of 0.46L in the last 24 hrs.    PHYSICAL EXAMINATION: General: Well-developed well-nourished male in no acute distress  Neuro:   intact HEENT:  +JVD Neck:  Trachea midline Cardiovascular:  RRR no MRG noted, -carotid bruits Lungs:  CTAB Abdomen:   soft, nontender, nondistended, positive bowel sounds Musculoskeletal:  No edema Skin:   intact   Recent Labs Lab 06/28/13 0440 06/28/13 1803 06/29/13 0345 06/30/13 0235 07/01/13 0406  NA 130* 131* 132* 134* 133*  K 3.8 3.3* 3.3* 3.3* 3.4*  CL 92* 93* 96 99 99  CO2 17* 22 22 22 24   GLUCOSE 90 99 120* 107* 96  BUN 77* 82* 78* 69* 56*  CREATININE 4.06* 3.92* 3.75* 3.48* 3.19*  CALCIUM 8.6 8.1* 8.0* 8.0* 8.3*  MG 2.2  --  2.3  --   --   PHOS 6.9*  --  3.5  --   --      Recent Labs Lab 06/29/13 0345 06/30/13 0235 07/01/13 0406  HGB 10.8* 10.4* 10.3*  HCT 30.6* 30.4* 30.8*  WBC 8.8 7.3 4.8  PLT 311 305 313    Recent Labs Lab 06/27/13 2147 06/28/13 1803 06/29/13 0345  PROBNP 66702.0* 42206.0* 31751.0*  Ct Head Wo Contrast  06/29/2013   *RADIOLOGY REPORT*  Clinical Data: Worsening confusion.  Hypertensive crisis.  CT HEAD WITHOUT CONTRAST  Technique:  Contiguous axial images were obtained from the base of the skull through the vertex without contrast.  Comparison: 02/03/2009.  Findings: No mass lesion, mass effect, midline shift, hydrocephalus, hemorrhage.  No acute territorial cortical ischemia/infarct.  There are new lacunar infarct adjacent to the left frontal horn of the lateral ventricle, with more pronounced chronic ischemic white matter disease.  This shows significant progression compared to the prior exam of 02/03/2009.  There are no definite watershed infarcts.  The calvarium appears intact. Mastoid air cells clear.  Hypoplastic left frontal sinus.  New right thalamic lacunar infarct is age indeterminant.  IMPRESSION: Marked interval progression of periventricular white matter disease with small lacunar infarct adjacent to the frontal horn of the left lateral ventricle. The white matter disease is probably chronic ischemic but is nonspecific and could be associated with vasculitis, migraine headache, or demyelinating disease.  Age indeterminant right thalamic lacunar infarct.   Original Report Authenticated By: Andreas Newport, M.D.   Dg Chest  Port 1 View  07/01/2013   *RADIOLOGY REPORT*  Clinical Data: Shortness of breath.  Evaluate for pulmonary edema.  PORTABLE CHEST - 1 VIEW  Comparison: Chest x-ray 06/30/2013.  Findings: Lung volumes are normal.  No acute consolidative airspace disease.  No pleural effusions.  Cephalization of the pulmonary vasculature, without frank pulmonary edema.  Mild cardiomegaly. Upper mediastinal contours are within  normal limits.  IMPRESSION: 1.  Mild cardiomegaly with pulmonary venous congestion, but no frank pulmonary edema.   Original Report Authenticated By: Trudie Reed, M.D.   Dg Chest Port 1 View  06/30/2013   *RADIOLOGY REPORT*  Clinical Data: Shortness of breath.  Evaluate for pulmonary edema.  PORTABLE CHEST - 1 VIEW  Comparison: Chest x-ray 06/29/2013.  Findings: Lung volumes are normal. There is cephalization of the pulmonary vasculature and slight indistinctness of the interstitial markings suggestive of mild pulmonary edema.  Mild cardiomegaly. Probable trace left pleural effusion.  Upper mediastinal contours are within normal limits.  IMPRESSION: 1.  Findings, as above, suggestive of mild congestive heart failure.   Original Report Authenticated By: Trudie Reed, M.D.   CXR: Mild pulmonary venous congestion but no frank edema.   ASSESSMENT / PLAN: Principal Problem:   Hypertensive emergency Active Problems:   Acute on Chronic CHF    Uncontrolled hypertension   Acute kidney injury   Chronic renal insufficiency   Mild pulmonary htn   PULMONARY A: 1. Acute pulmonary edema-resolving 2. new systolic CHF 3. Mild pulmonary htn  P:    Supplemental O2 as needed. Patient has been comfortable on room air.   Scr is still elevated but normalizing. Allow for negative balance  No daily pcxr required as pulmonary edema is resolving   CARDIOVASCULAR A:  1. Hypertensive emergency, med non-compliance 2. Chest pain 3. Diffuse LV hypokinesis by Echo 8/10, ? Hypertensive injury Echo 8/10 reveals EJ 20%, mitral regurd, tricuspid regurg, and mild pulm htn with peak pressure of 52 mmHg 4. Small lacunar infarcts, age undetermined, Uncontrolled HTN vs Carotid Stenosis?  P:   Cardiology consult on 8/10  Patient off cardene drip since 8/12  Now taking amlodipine (max dose), carvedilol (max dose) , clonidine (max dose) , and hydralazine (could increase dose)  Afterload reduction has  improved him the most, not necessarily lasix  Carotid Dopplers ordered to assess possible stenosis causing lacunar infarcts  RENAL A:  1.  Acute on chronic kidney injury-improved, hypokalemia, hyponatremia   P:    Continue to hold lasix and allow renal function to improve  Follow renal function with serial AM BMP. Continues to improve.   Allow for negative balance.   KCL x 2 doses today.   consider renal consult pre dc  GASTROINTESTINAL A: Heart Diet   HEMATOLOGIC A:   1. Anemia of chronic dz secondary to CKD  P:   Monitor H&H with CBCs  SCD's for DVT prophylaxis   OOB to chair, ambulation as tolerated.   INFECTIOUS A:  No acute issue P:  Continue to follow WBC trend and fever curve   ENDOCRINE A:    No acute issue  NEUROLOGIC A:   AMS on 8/1, stat CT reveals small lacunar infarct of unknown age   Intermittent poor autoregulation from HTN likely  P:  - Monitor for further mental status changes, if so consider MRI - Awaiting carotid US - Asprin 325 mg daily  -control afterload less 150 sys  Summary: Patient stable and awaiting transfer to stepdown bed and carotid US. KCl  replaced this AM.  Morning labs ordered, no need for daily CXR's at this time.  To tele  Once leaves ICU, cards accpeted pt , would sign off  Matthew Little PA-S   I have fully examined this patient and agree with above findings.    And edited infull  Matthew Beard. Tyson Alias, MD, FACP Pgr: [phone] [facility]

## 2013-07-02 ENCOUNTER — Inpatient Hospital Stay (HOSPITAL_COMMUNITY): Payer: Medicaid Other

## 2013-07-02 LAB — CBC
MCH: 28.9 pg (ref 26.0–34.0)
MCV: 85.1 fL (ref 78.0–100.0)
Platelets: 293 10*3/uL (ref 150–400)
RBC: 3.36 MIL/uL — ABNORMAL LOW (ref 4.22–5.81)

## 2013-07-02 LAB — BASIC METABOLIC PANEL
CO2: 22 mEq/L (ref 19–32)
Calcium: 8.3 mg/dL — ABNORMAL LOW (ref 8.4–10.5)
Creatinine, Ser: 3.3 mg/dL — ABNORMAL HIGH (ref 0.50–1.35)
Glucose, Bld: 104 mg/dL — ABNORMAL HIGH (ref 70–99)

## 2013-07-02 LAB — PROTIME-INR: Prothrombin Time: 13.3 seconds (ref 11.6–15.2)

## 2013-07-02 MED ORDER — FUROSEMIDE 80 MG PO TABS
80.0000 mg | ORAL_TABLET | Freq: Every day | ORAL | Status: DC
  Administered 2013-07-02 – 2013-07-04 (×3): 80 mg via ORAL
  Filled 2013-07-02 (×4): qty 1

## 2013-07-02 NOTE — Progress Notes (Signed)
Received pt to rm 4E11 from 2100, alert and oriented, denies pain at this time, orders carried out. Call bell placed within reach. Will monitor.

## 2013-07-02 NOTE — Consult Note (Signed)
Please see note by Dr. Virgina Organ.  I have evaluated the patient and agree with plans as articulated in her note  I was Asked by Dr. Delton Coombes to see this 37 year old man with a longstanding history of hypertension and lack of adequate finances for treatment per his report.  He has a longstanding history of hypertension with hypertensive heart disease, hypertensive CNS disease and chronic kidney disease.  Of note, pt has a longstanding history of proteinuria and hypoalbuminemia. Work up in past has included a 24 hour urine protein revealing: 225mg  on 08/27/09 when sAlbumin was 2.7g/dl, HIV neg 08/65, ANA 7:8469 on 02/22/11, RF 56 on 02/22/11, c4 low 12 (16-47) & c3 238 (88-201), ENA was elevated at 8.  Assessment: Severe hypertension Chronic Kidney Disease prob due to hypertensive nephrosclerosis Hx of pos. ANA, ENA, low C3, hypoalb with trivial proteinuria; r/o SLE  Rec: Complete renal work up with ultrasound, urine analysis and protein excretion, serum albumin, recheck SLE studies; would use BID BP regimen for him.  Rheumatology input if serologies positive at high titer. Kemal Amores C

## 2013-07-02 NOTE — Progress Notes (Signed)
Pt lying in bed, AOx4. Denies any chest pain, no dizziness, no nausea and vomiting, no neuro deficit noted. Family at bedside. Instructed about the 24hour urine collection,verbalized understanding. Went down for the renal ultrasound per wheelchair. Will continue to monitor pt.

## 2013-07-02 NOTE — Consult Note (Signed)
[facility] CONSULT NOTE    Date: 07/02/2013                  Patient Name:  Matthew Beard  MRN: [id number]  DOB: [birth year]  Age / Sex: 37 y.o., male         PCP: No PCP Per Patient                 Service Requesting Consult: PCCM                 Reason for Consult: Uncontrolled HTN and kidney disease            History of Present Illness: Matthew Beard is a 37 y.o. male with a PMHx of uncontrolled HTN and medication non-complaince, who was admitted to [facility] on 06/27/2013 for evaluation of worsening SOB and malignant HTN.   His last similar hospital admission was in 01/2012.  He does not have regular PCP follow up as he says he has no insurance and currently no income and cannot afford medications or doctor visits.  He says he has not smoked any cigarettes for the past two months and has also stopped drinking alcohol.  He denies any illicit drug use.  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   Of note, he does endorse 2-3 days of hematuria in his urine 2-3 months ago and prior u/a from 2012 and 2013 show small Hgb with 3-6 RBCs and protein uria.    We were consulted for worsening CKD.    Medications: Outpatient medications: Prescriptions prior to admission  Medication Sig Dispense Refill  . amLODipine (NORVASC) 10 MG tablet Take 10 mg by mouth daily.      . Aspirin-Acetaminophen-Caffeine (EXCEDRIN PO) Take 2 tablets by mouth daily as needed (for headache).      . Aspirin-Salicylamide-Caffeine (BC HEADACHE PO) Take 1 packet by mouth daily as needed (for headache).      . hydrochlorothiazide (HYDRODIURIL) 25 MG tablet Take 25 mg by mouth daily.      . metoprolol (LOPRESSOR) 50 MG tablet Take 50 mg by mouth 2 (two) times daily.       Current medications: Current  Facility-Administered Medications  Medication Dose Route Frequency Provider Last Rate Last Dose  . acetaminophen (TYLENOL) tablet 650 mg  650 mg Oral Q6H PRN Lonia Farber, MD   650 mg at 06/29/13 2004  . amLODipine (NORVASC) tablet 10 mg  10 mg Oral Daily Merwyn Katos, MD   10 mg at 07/02/13 1234  . aspirin EC tablet 325 mg  325 mg Oral Daily Nelda Bucks, MD   325 mg at 07/02/13 1233  . carvedilol (COREG) tablet 12.5 mg  12.5 mg Oral BID WC Jenelle Mages, MD   12.5 mg at 07/02/13 1001  . cloNIDine (CATAPRES) tablet 0.2 mg  0.2 mg Oral TID Vesta Mixer, MD   0.2 mg at 07/02/13 1009  . furosemide (LASIX) tablet 80 mg  80 mg Oral Daily Vesta Mixer, MD   80 mg at 07/02/13 0956  . hydrALAZINE (APRESOLINE) tablet 75 mg  75 mg Oral Q8H Nelda Bucks, MD   75 mg at 07/02/13 1512  . labetalol (NORMODYNE,TRANDATE) injection 10-40 mg  10-40 mg Intravenous Q2H PRN Merwyn Katos, MD  Allergies: Allergies  Allergen Reactions  . Amoxicillin Hives    Past Medical History: Past Medical History  Diagnosis Date  . Hypertension    Past Surgical History: History reviewed. No pertinent past surgical history.  Family History: Family History  Problem Relation Age of Onset  . Hypertension Mother   . Hypertension Father   . Stroke Mother   . Stroke Father   . Heart disease Mother   . Heart disease Father   . Hypertension Brother    Social History: History   Social History  . Marital Status: Single    Spouse Name: N/A    Number of Children: N/A  . Years of Education: N/A   Occupational History  . Not on file.   Social History Main Topics  . Smoking status: Never Smoker   . Smokeless tobacco: Not on file  . Alcohol Use: No  . Drug Use: No  . Sexual Activity:    Other Topics Concern  . Not on file   Social History Narrative  . No narrative on file   =Review of Systems:  Constitutional:  Denies fever, chills, diaphoresis, appetite change   HEENT:  Denies congestion, sore throat  Respiratory:  SOB, DOE, and orthopnea  Cardiovascular:  Mild lower extremity edema. Denies palpitations.  Gastrointestinal:  Denies nausea, vomiting, abdominal pain, diarrhea, constipation, blood in stool and abdominal distention.   Genitourinary:  Hx of hematuria.  Denies dysuria, urgency, frequency, flank pain and difficulty urinating.   Musculoskeletal:  Denies myalgias, back pain, joint swelling, arthralgias and gait problem.   Skin:  Denies pallor, rash and wound.   Neurological:  Headaches.  Denies dizziness, seizures, syncope, weakness, light-headedness, numbness.   Vital Signs: Blood pressure 146/92, pulse 62, temperature 97.7 F (36.5 C), temperature source Oral, resp. rate 20, height 5\' 6"  (1.676 m), weight 176 lb 12.9 oz (80.2 kg), SpO2 96.00%.  Weight trends: Filed Weights   07/01/13 0453 07/02/13 0500 07/02/13 0647  Weight: 179 lb 3.7 oz (81.3 kg) 177 lb 11.1 oz (80.6 kg) 176 lb 12.9 oz (80.2 kg)   Physical Exam: Vitals reviewed. General: sitting up on side of bed, NAD HEENT: PERRL, EOMI Cardiac: RRR, no rubs, murmurs or gallops Pulm: clear to auscultation bilaterally, no wheezes, rales, or rhonchi Abd: soft, nontender, nondistended, BS present Ext: warm and well perfused, mild b/l pitting edema of lower extremities, moving all 4 extremities, no tenderness to palpation Neuro: alert and oriented X3, cranial nerves II-XII grossly intact, strength and sensation to light touch equal in bilateral upper and lower extremities  Lab results: Basic Metabolic Panel:  Recent Labs Lab 06/28/13 0440  06/29/13 0345 06/30/13 0235 07/01/13 0406 07/02/13 0330  NA 130*  < > 132* 134* 133* 132*  K 3.8  < > 3.3* 3.3* 3.4* 3.7  CL 92*  < > 96 99 99 98  CO2 17*  < > 22 22 24 22   GLUCOSE 90  < > 120* 107* 96 104*  BUN 77*  < > 78* 69* 56* 51*  CREATININE 4.06*  < > 3.75* 3.48* 3.19* 3.30*  CALCIUM 8.6  < > 8.0* 8.0* 8.3* 8.3*  MG 2.2  --   2.3  --   --   --   PHOS 6.9*  --  3.5  --   --   --   < > = values in this interval not displayed.  CBC:  Recent Labs Lab 06/27/13 2147 06/29/13 0345 06/30/13 0235 07/01/13 0406 07/02/13 0330  WBC 7.6 8.8 7.3 4.8 4.8  NEUTROABS  --   --  5.3  --   --   HGB 11.4* 10.8* 10.4* 10.3* 9.7*  HCT 32.7* 30.6* 30.4* 30.8* 28.6*  MCV 81.8 80.5 83.7 84.6 85.1  PLT 270 311 305 313 293   Cardiac Enzymes:  Recent Labs Lab 06/28/13 0050 06/28/13 0720 06/28/13 1152  TROPONINI <0.30 <0.30 <0.30   CBG:  Recent Labs Lab 06/28/13 0215  GLUCAP 80   Microbiology: Results for orders placed during the hospital encounter of 06/27/13  MRSA PCR SCREENING     Status: None   Collection Time    06/28/13  2:15 AM      Result Value Range Status   MRSA by PCR NEGATIVE  NEGATIVE Final   Comment:            The GeneXpert MRSA Assay (FDA     approved for NASAL specimens     only), is one component of a     comprehensive MRSA colonization     surveillance program. It is not     intended to diagnose MRSA     infection nor to guide or     monitor treatment for     MRSA infections.   Coagulation Studies:  Recent Labs  07/02/13 0330  LABPROT 13.3  INR 1.03   Imaging: Dg Chest Port 1 View  07/01/2013   *RADIOLOGY REPORT*  Clinical Data: Shortness of breath.  Evaluate for pulmonary edema.  PORTABLE CHEST - 1 VIEW  Comparison: Chest x-ray 06/30/2013.  Findings: Lung volumes are normal.  No acute consolidative airspace disease.  No pleural effusions.  Cephalization of the pulmonary vasculature, without frank pulmonary edema.  Mild cardiomegaly. Upper mediastinal contours are within normal limits.  IMPRESSION: 1.  Mild cardiomegaly with pulmonary venous congestion, but no frank pulmonary edema.   Original Report Authenticated By: Trudie Reed, M.D.     Assessment & Plan: Matthew Beard is a 37 y.o. male with a PMHx of uncontrolled HTN and medication non-complaince, who was admitted to [facility] on  06/27/2013 for evaluation of worsening SOB found to have CHF with EF 20%, malignant HTN, and CKD.  CKD in setting of uncontrolled HTN likely secondary to hypertensive nephrosclerosis.  Cr on admission 3.88, increased from 2.41 April 2013.  Prior u/a from 2012 and 2013 shows proteinuria and small Hb.  Endorses hx of gross hematuria.  Prior u/a from 2012 and 2013 show proteinuria along with small Hb with 3-6 rbcs.  Additionally, 24 hour urine protein collection in 2010 with elevated protein of 225.  ?SLE given prior rheumatology work up in 2010 given positive ANA with speckled pattern and titer of 1:1280, elevated RF, but negative dsDNA and ccp.  Last albumin 3.7 in 2012. Further work up in 2010 showed Elevated C3 238 and total complement >60, with low C4. ENA >8.  -u/a -24 hour urine protein -ANA and complement levels -renal ultrasound -trend renal function, albumin -will need to eventually follow up in our renal office as an outpatient  Uncontrolled HTN--improved. Admitted for hypertensive emergency.  Currently on BP meds including norvasc 10mg , coreg 12.5mg , clonidine 0.2mg  TID, lasix 80mg  qd, and hydralazine 75mg  q8hours.  Hx of significant non-compliance, therefore current extensive regimen seems difficult for him to likely adhere too as an outpatient and afford his medications.  Needs social work or case management support, regular pcp follow up that is affordable, and likely simplified regimen as much as possible.  Consider orange  card if he qualifies. Urine metanephrine studies from 2010 were significant for elevated normetanephrine of 1161 and total metanephrine of 1232.  ?tumor with possible confirmation with  plasma metanephrines and chromogranin a needed.  -financial counselor discussion needed  -recommend repeat urine metanephrine's and do plasma metanephrine study as well  Case discussed and patient seen with Dr. Lowell Guitar  Signed: Darden Palmer, MD PGY-2, Internal Medicine Resident Pager:  [phone]  07/02/2013,7:45 PM

## 2013-07-02 NOTE — Progress Notes (Addendum)
Patient ID: Matthew Beard, male   DOB: 16-Oct-1976, 37 y.o.   MRN: [id number]   Patient Name: Matthew Beard Date of Encounter: 07/02/2013  Primary cardiologist:  Dietrich Pates, MD Primary MD:  tba  SUBJECTIVE  Breathing better. No CP. Admits to not checking his BP at home.    CURRENT MEDS . amLODipine  10 mg Oral Daily  . aspirin EC  325 mg Oral Daily  . carvedilol  12.5 mg Oral BID WC  . cloNIDine  0.2 mg Oral TID  . furosemide  40 mg Intravenous Once  . hydrALAZINE  75 mg Oral Q8H    OBJECTIVE  Filed Vitals:   07/02/13 0500 07/02/13 0600 07/02/13 0639 07/02/13 0647  BP: 137/103 146/107 144/108   Pulse: 63 65 66   Temp:   97.7 F (36.5 C)   TempSrc:   Oral   Resp: 21 21 20    Height:      Weight: 177 lb 11.1 oz (80.6 kg)   176 lb 12.9 oz (80.2 kg)  SpO2: 96% 100% 100%     Intake/Output Summary (Last 24 hours) at 07/02/13 0738 Last data filed at 07/02/13 0500  Gross per 24 hour  Intake   1680 ml  Output   1220 ml  Net    460 ml   Filed Weights   07/01/13 0453 07/02/13 0500 07/02/13 0647  Weight: 179 lb 3.7 oz (81.3 kg) 177 lb 11.1 oz (80.6 kg) 176 lb 12.9 oz (80.2 kg)    PHYSICAL EXAM  General: Pleasant, NAD. Neuro: Alert and oriented X 3. Moves all extremities spontaneously. Psych: Normal affect. HEENT:  Normal  Neck: Supple without bruits or JVD. Lungs:  Resp regular and unlabored, CTA. Heart: RRR no s3, s4, or murmurs. Abdomen: Soft, non-tender, non-distended, BS + x 4.  Extremities: No clubbing, cyanosis or edema. DP/PT/Radials 2+ and equal bilaterally.  Accessory Clinical Findings  CBC  Recent Labs  06/30/13 0235 07/01/13 0406 07/02/13 0330  WBC 7.3 4.8 4.8  NEUTROABS 5.3  --   --   HGB 10.4* 10.3* 9.7*  HCT 30.4* 30.8* 28.6*  MCV 83.7 84.6 85.1  PLT 305 313 293   Basic Metabolic Panel  Recent Labs  07/01/13 0406 07/02/13 0330  NA 133* 132*  K 3.4* 3.7  CL 99 98  CO2 24 22  GLUCOSE 96 104*  BUN 56* 51*  CREATININE 3.19* 3.30*    CALCIUM 8.3* 8.3*   Liver Function Tests No results found for this basename: AST, ALT, ALKPHOS, BILITOT, PROT, ALBUMIN,  in the last 72 hours No results found for this basename: LIPASE, AMYLASE,  in the last 72 hours Cardiac Enzymes No results found for this basename: CKTOTAL, CKMB, CKMBINDEX, TROPONINI,  in the last 72 hours BNP No components found with this basename: POCBNP,  D-Dimer No results found for this basename: DDIMER,  in the last 72 hours Hemoglobin A1C No results found for this basename: HGBA1C,  in the last 72 hours Fasting Lipid Panel No results found for this basename: CHOL, HDL, LDLCALC, TRIG, CHOLHDL, LDLDIRECT,  in the last 72 hours Thyroid Function Tests No results found for this basename: TSH, T4TOTAL, FREET3, T3FREE, THYROIDAB,  in the last 72 hours  TELE NSR  ECG   Radiology/Studies  Dg Chest 2 View  06/27/2013   *RADIOLOGY REPORT*  Clinical Data: Shortness of breath for 1 week, worsening today.  CHEST - 2 VIEW  Comparison: 07/23/2011 and 04/15/2011 radiographs; CT 08/21/2009.  Findings:  There is progressive cardiomegaly and/or recurrent pericardial effusion.  There is progressive diffuse interstitial prominence throughout both lungs suspicious for pulmonary edema. There are small bilateral pleural effusions.  There is likely some extension of pleural fluid into the left major fissure.  No acute osseous findings are seen.  IMPRESSION: Progressive cardiomegaly, diffuse interstitial prominence and small bilateral pleural effusions most consistent with congestive heart failure.  Atypical infection could have this appearance. Of note, the patient had a pericardial effusion on prior CT; a recurrent pericardial effusion could account for the enlargement of the cardiac silhouette.   Original Report Authenticated By: Carey Bullocks, M.D.   Ct Head Wo Contrast  06/29/2013   *RADIOLOGY REPORT*  Clinical Data: Worsening confusion.  Hypertensive crisis.  CT HEAD WITHOUT  CONTRAST  Technique:  Contiguous axial images were obtained from the base of the skull through the vertex without contrast.  Comparison: 02/03/2009.  Findings: No mass lesion, mass effect, midline shift, hydrocephalus, hemorrhage.  No acute territorial cortical ischemia/infarct.  There are new lacunar infarct adjacent to the left frontal horn of the lateral ventricle, with more pronounced chronic ischemic white matter disease.  This shows significant progression compared to the prior exam of 02/03/2009.  There are no definite watershed infarcts.  The calvarium appears intact. Mastoid air cells clear.  Hypoplastic left frontal sinus.  New right thalamic lacunar infarct is age indeterminant.  IMPRESSION: Marked interval progression of periventricular white matter disease with small lacunar infarct adjacent to the frontal horn of the left lateral ventricle. The white matter disease is probably chronic ischemic but is nonspecific and could be associated with vasculitis, migraine headache, or demyelinating disease.  Age indeterminant right thalamic lacunar infarct.   Original Report Authenticated By: Andreas Newport, M.D.   Dg Chest Port 1 View  07/01/2013   *RADIOLOGY REPORT*  Clinical Data: Shortness of breath.  Evaluate for pulmonary edema.  PORTABLE CHEST - 1 VIEW  Comparison: Chest x-ray 06/30/2013.  Findings: Lung volumes are normal.  No acute consolidative airspace disease.  No pleural effusions.  Cephalization of the pulmonary vasculature, without frank pulmonary edema.  Mild cardiomegaly. Upper mediastinal contours are within normal limits.  IMPRESSION: 1.  Mild cardiomegaly with pulmonary venous congestion, but no frank pulmonary edema.   Original Report Authenticated By: Trudie Reed, M.D.   Dg Chest Port 1 View  06/30/2013   *RADIOLOGY REPORT*  Clinical Data: Shortness of breath.  Evaluate for pulmonary edema.  PORTABLE CHEST - 1 VIEW  Comparison: Chest x-ray 06/29/2013.  Findings: Lung volumes are  normal. There is cephalization of the pulmonary vasculature and slight indistinctness of the interstitial markings suggestive of mild pulmonary edema.  Mild cardiomegaly. Probable trace left pleural effusion.  Upper mediastinal contours are within normal limits.  IMPRESSION: 1.  Findings, as above, suggestive of mild congestive heart failure.   Original Report Authenticated By: Trudie Reed, M.D.   Dg Chest Port 1 View  06/29/2013   *RADIOLOGY REPORT*  Clinical Data: CHF; shortness of breath  PORTABLE CHEST - 1 VIEW  Comparison: Chest radiograph 06/28/2013  Findings: Unchanged cardiomegaly.  Slight interval increase in perihilar and mid/lower lung heterogeneous pulmonary opacities.  No definite pleural effusion or pneumothorax.  IMPRESSION: Cardiomegaly with increasing perihilar and mid/lower lung heterogeneous opacities, likely pulmonary edema.   Original Report Authenticated By: Annia Belt, M.D   Dg Chest Port 1 View  06/28/2013   *RADIOLOGY REPORT*  Clinical Data: 37 year old male with shortness of breath.  PORTABLE CHEST - 1 VIEW  Comparison: 06/27/2013 and prior chest radiographs  Findings: Enlargement of cardiopericardial silhouette is noted with pulmonary vascular congestion. Interstitial pulmonary edema has decreased. Very small bilateral pleural effusions are noted. Focal opacity along the lateral mid - lower left hemithorax is noted and could represent pleural fluid. There is no evidence of pneumothorax.  IMPRESSION: Decreased pulmonary edema otherwise stable chest.   Original Report Authenticated By: Harmon Pier, M.D.    ASSESSMENT AND PLAN  Principal Problem:   Hypertensive emergency Active Problems:   Uncontrolled hypertension   Anemia   Chronic renal insufficiency   Acute kidney injury   Acute systolic CHF (congestive heart failure)   Echo in 2010 showed severe LVH with EF of 70%. He now has an EF of 20% with LV dilatation and moderate LVH. 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 I have discussed with patient and he now has good understanding of the importance of taking his meds, following low salt diet and checking his BP at home. He does not need a cardiac cath. Meds reviewed. Bp under pretty good control today.  He is still weak and likely needs 1 more day in the hospital.  Will change the IV lasix to PO 80 QD and make sure that he diureses with that.  He will need a nephrology consult at some point - no indication for dialysis yet.  He needs to get a primary medical doctor.        Vesta Mixer, Montez Hageman., MD, [facility] 07/02/2013, 7:39 AM Office - [phone] Pager [phone]

## 2013-07-02 NOTE — Progress Notes (Signed)
PULMONARY  / CRITICAL CARE MEDICINE  Name: Matthew Beard MRN: [id number]  DOB: 1976/11/15    ADMISSION DATE:  06/27/2013 CONSULTATION DATE:  06/28/2013  REFERRING MD :  Dr. Rennis Chris PRIMARY SERVICE: Critical Care Medicine  CHIEF COMPLAINT:  Shortness of breath  BRIEF PATIENT DESCRIPTION: Matthew Beard is a 37 year old male with hypertensive emergency in the setting of medication noncompliance.  SIGNIFICANT EVENTS / STUDIES:   Chest x-ray dated 06/28/2013 demonstrates cardiomegaly, pulmonary edema, and small bilateral effusion  8/11- remain son vaso active BP control  Acute mental status decline on 06/29/13, Stat CT reveals marked interval progression of periventicular white matter dz with smal/l lacunar infarct adjacent to the frontal horn of the left lateral ventricle age unknown.   Carotid Dopplers 8/13  show patent vertebral arteries, 1-39% stenosis in right ICA and left ICA  LINES / TUBES: 1. Peripheral IV only  CULTURES: 1. None  ANTIBIOTICS: 1. None  SUBJECTIVE: No complaints this afternoon and no overnight events.   VITAL SIGNS: Temp:  [97.4 F (36.3 C)-97.9 F (36.6 C)] 97.9 F (36.6 C) (08/09 2133) Pulse Rate:  [78-99] 85 (08/10 0007) Resp:  [10-34] 10 (08/10 0007) BP: (178-241)/(40-187) 206/155 mmHg (08/10 0007) SpO2:  [10 %-100 %] 100 % (08/10 0007)   INTAKE / OUTPUT: Patient at even balance today   PHYSICAL EXAMINATION: General: Pleasant, WDWN male. Not acutely distressed.  Neuro:   Intact, moves all extremities, no focal deficits HEENT:  Moist mucus membranes, trachea midline, no JVD Cardiovascular:  RRR no MRG noted, -carotid bruits Lungs:  CTAB Abdomen:   soft, nontender, nondistended, positive bowel sounds Musculoskeletal:  Trace edema  Skin:   intact   Recent Labs Lab 06/28/13 0440 06/28/13 1803 06/29/13 0345 06/30/13 0235 07/01/13 0406 07/02/13 0330  NA 130* 131* 132* 134* 133* 132*  K 3.8 3.3* 3.3* 3.3* 3.4* 3.7  CL 92* 93* 96 99  99 98  CO2 17* 22 22 22 24 22   GLUCOSE 90 99 120* 107* 96 104*  BUN 77* 82* 78* 69* 56* 51*  CREATININE 4.06* 3.92* 3.75* 3.48* 3.19* 3.30*  CALCIUM 8.6 8.1* 8.0* 8.0* 8.3* 8.3*  MG 2.2  --  2.3  --   --   --   PHOS 6.9*  --  3.5  --   --   --     Recent Labs Lab 06/30/13 0235 07/01/13 0406 07/02/13 0330  HGB 10.4* 10.3* 9.7*  HCT 30.4* 30.8* 28.6*  WBC 7.3 4.8 4.8  PLT 305 313 293    Recent Labs Lab 06/27/13 2147 06/28/13 1803 06/29/13 0345  PROBNP 66702.0* 42206.0* 31751.0*  Dg Chest Port 1 View  07/01/2013   *RADIOLOGY REPORT*  Clinical Data: Shortness of breath.  Evaluate for pulmonary edema.  PORTABLE CHEST - 1 VIEW  Comparison: Chest x-ray 06/30/2013.  Findings: Lung volumes are normal.  No acute consolidative airspace disease.  No pleural effusions.  Cephalization of the pulmonary vasculature, without frank pulmonary edema.  Mild cardiomegaly. Upper mediastinal contours are within normal limits.  IMPRESSION: 1.  Mild cardiomegaly with pulmonary venous congestion, but no frank pulmonary edema.   Original Report Authenticated By: Trudie Reed, M.D.   CXR: Mild pulmonary venous congestion but no frank edema.   ASSESSMENT / PLAN: Principal Problem:   Hypertensive emergency Active Problems:   Acute on Chronic CHF    Uncontrolled hypertension   Acute kidney injury   Chronic renal insufficiency   Mild pulmonary htn   PULMONARY A: 1. Acute  pulmonary edema >>> resolving 2. New systolic CHF (EF of 20%) 3. 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 Hypertensive emergency, secondary to med non-compliance 3. Diffuse LV hypokinesis by Echo 8/10, ? Hypertensive injury 4. Echo 8/10 reveals EF  20%, mitral regurd, tricuspid regurg, and mild pulm htn with peak pressure of 52 mmHg 5. Small lacunar infarcts, age undetermined, Uncontrolled HTN vs Carotid Stenosis?  P:   Cards following patient, no need for cardiac cath per Dr. Daleen Squibb.   Continue amlodipine, carvedilol, clonidine, lasix, and hydralazine per cardiology  BP seems to be well controlled today.   Carotid Dopplers 8/13 show patent vertebral arteries, 1-39% stenosis in right IC and left IC  Discussed importance of home medication regimen and patient encouraged to seek out primary care provider. Social work consulted as patient's primary issue for medication non-compliance is that he is uninsured with limited access to medical care. He requests help finding a PCP.   RENAL A:  1.  Acute on chronic kidney injury-improved, hypokalemia, hyponatremia   P:    Renal Consult today 8/14  in anticipation of D/C tomorrow 8/15 and unresolved BUN/Scr   F/U BMP daily in AM   Hold ACE-I/ARB in setting of worsening renal function/preexisiting renal dz  GASTROINTESTINAL A: 1. Heart Diet  P:  Patient tolerating PO meds and food   HEMATOLOGIC A:   1. Anemia of chronic dz secondary to CKD P:   Monitor H&H with CBCs  SCD's for DVT prophylaxis   Patient up at lib   INFECTIOUS A: 1. No acute issue P:  Continue to follow WBC trend and fever curve   ENDOCRINE A:   1. Elevated blood glucose P:  Glucose seems to be slightly elevated on multiple CBG's. Will order A1C to assess for diabetes as patient does not have PCP.    NEUROLOGIC A:   AMS on 8/1, stat CT reveals small lacunar infarct of unknown age, likely due to ICU delirium    Intermittent poor autoregulation from HTN likely  P:  - Monitor for further mental status changes, if so consider MRI. Mentation intact 8/14 - Carotid US show 1-39% stenosis in both R ICA and L ICA  - Asprin 325 mg daily    Summary: Patient has no acute complaints today. Carotid US  reveals 1-39% stenosis in R ICA and L ICA. No cardiac cath needed per cardiology. Patient counseled on importance of controlling blood pressure and remaining compliant with home medications. He needs a PCP provider to help aid in BP control. Would like input from social work regarding resources in the area of PCP's for uninsured as this is the biggest barrier to med compliance.  Consult to renal today in preparation for patient discharge tomorrow. BUN and Scr have failed to normalize. A1C ordered due to a number of elevated CBG's throughout this admission. BMP in the AM. Plan for discharge on 07/03/13 if patient continues to remain stable.    Jennifer Little PA-S  Levy Pupa, MD, PhD 07/02/2013, 3:44 PM [facility] [phone] or if no answer [phone]

## 2013-07-03 ENCOUNTER — Other Ambulatory Visit (HOSPITAL_COMMUNITY): Payer: No Typology Code available for payment source

## 2013-07-03 ENCOUNTER — Inpatient Hospital Stay (HOSPITAL_COMMUNITY): Payer: Medicaid Other

## 2013-07-03 LAB — URINALYSIS, ROUTINE W REFLEX MICROSCOPIC
Bilirubin Urine: NEGATIVE
Nitrite: NEGATIVE
Protein, ur: 100 mg/dL — AB
Specific Gravity, Urine: 1.013 (ref 1.005–1.030)
Urobilinogen, UA: 0.2 mg/dL (ref 0.0–1.0)

## 2013-07-03 LAB — CREATININE, URINE, 24 HOUR
Collection Interval-UCRE24: 24 hours
Creatinine, Urine: 63.96 mg/dL
Urine Total Volume-UCRE24: 2350 mL

## 2013-07-03 LAB — RENAL FUNCTION PANEL
Albumin: 2.4 g/dL — ABNORMAL LOW (ref 3.5–5.2)
BUN: 50 mg/dL — ABNORMAL HIGH (ref 6–23)
Calcium: 8.5 mg/dL (ref 8.4–10.5)
Creatinine, Ser: 3.41 mg/dL — ABNORMAL HIGH (ref 0.50–1.35)
Glucose, Bld: 99 mg/dL (ref 70–99)
Phosphorus: 4.4 mg/dL (ref 2.3–4.6)
Potassium: 4.3 mEq/L (ref 3.5–5.1)

## 2013-07-03 LAB — URINE MICROSCOPIC-ADD ON

## 2013-07-03 LAB — PROTEIN, URINE, 24 HOUR
Protein, 24H Urine: 2303 mg/d — ABNORMAL HIGH (ref 50–100)
Protein, Urine: 98 mg/dL
Urine Total Volume-UPROT: 2350 mL

## 2013-07-03 LAB — BASIC METABOLIC PANEL
CO2: 22 mEq/L (ref 19–32)
Chloride: 99 mEq/L (ref 96–112)
GFR calc Af Amer: 25 mL/min — ABNORMAL LOW (ref >90)
Potassium: 3.9 mEq/L (ref 3.5–5.1)
Sodium: 134 mEq/L — ABNORMAL LOW (ref 135–145)

## 2013-07-03 LAB — C3 COMPLEMENT: C3 Complement: 103 mg/dL (ref 90–180)

## 2013-07-03 LAB — HEMOGLOBIN A1C: Mean Plasma Glucose: 108 mg/dL (ref <117)

## 2013-07-03 LAB — C4 COMPLEMENT: Complement C4, Body Fluid: 8 mg/dL — ABNORMAL LOW (ref 10–40)

## 2013-07-03 MED ORDER — HYDRALAZINE HCL 50 MG PO TABS
100.0000 mg | ORAL_TABLET | Freq: Three times a day (TID) | ORAL | Status: DC
  Administered 2013-07-03 – 2013-07-04 (×3): 100 mg via ORAL
  Filled 2013-07-03 (×6): qty 2

## 2013-07-03 MED ORDER — FUROSEMIDE 10 MG/ML IJ SOLN
100.0000 mg | Freq: Once | INTRAVENOUS | Status: AC
  Administered 2013-07-03: 100 mg via INTRAVENOUS
  Filled 2013-07-03: qty 10

## 2013-07-03 NOTE — Progress Notes (Signed)
I have seen and examined this patient and agree with the plan of care. Continue evaluation for CKD and HTN. Zyanna Leisinger W 07/03/2013, 2:31 PM

## 2013-07-03 NOTE — Progress Notes (Signed)
Subjective: No CP  Breathing is much better than when came in but still not normal. Objective: Filed Vitals:   07/02/13 1822 07/02/13 1823 07/02/13 2011 07/03/13 0627  BP: 152/92  137/93 142/102  Pulse:  64 62 68  Temp:   96 F (35.6 C) 97.7 F (36.5 C)  TempSrc:   Oral Oral  Resp:   20 20  Height:      Weight:    177 lb 11.2 oz (80.604 kg)  SpO2:   100% 100%   Weight change: 0.2 oz (0.004 kg)  Intake/Output Summary (Last 24 hours) at 07/03/13 0748 Last data filed at 07/03/13 0645  Gross per 24 hour  Intake   1742 ml  Output   1700 ml  Net     42 ml    General: Alert, awake, oriented x3, in no acute distress Neck:  JVP is increased to 11 cm Heart: Regular rate and rhythm, without murmurs, rubs, gallops.  Lungs: Clear to auscultation.  No rales or wheezes. Exemities:  1+ edema.   Neuro: Grossly intact, nonfocal.  Tele:  SR Lab Results: Results for orders placed during the hospital encounter of 06/27/13 (from the past 24 hour(s))  HEMOGLOBIN A1C     Status: None   Collection Time    07/02/13  4:01 PM      Result Value Range   Hemoglobin A1C 5.4  <5.7 %   Mean Plasma Glucose 108  <117 mg/dL  URINALYSIS, ROUTINE W REFLEX MICROSCOPIC     Status: Abnormal   Collection Time    07/03/13  1:38 AM      Result Value Range   Color, Urine YELLOW  YELLOW   APPearance CLEAR  CLEAR   Specific Gravity, Urine 1.013  1.005 - 1.030   pH 6.0  5.0 - 8.0   Glucose, UA NEGATIVE  NEGATIVE mg/dL   Hgb urine dipstick LARGE (*) NEGATIVE   Bilirubin Urine NEGATIVE  NEGATIVE   Ketones, ur NEGATIVE  NEGATIVE mg/dL   Protein, ur 161 (*) NEGATIVE mg/dL   Urobilinogen, UA 0.2  0.0 - 1.0 mg/dL   Nitrite NEGATIVE  NEGATIVE   Leukocytes, UA TRACE (*) NEGATIVE  URINE MICROSCOPIC-ADD ON     Status: Abnormal   Collection Time    07/03/13  1:38 AM      Result Value Range   Squamous Epithelial / LPF RARE  RARE   WBC, UA 3-6  <3 WBC/hpf   RBC / HPF 11-20  <3 RBC/hpf   Bacteria, UA RARE  RARE   Casts HYALINE CASTS (*) NEGATIVE  MAGNESIUM     Status: None   Collection Time    07/03/13  5:40 AM      Result Value Range   Magnesium 2.3  1.5 - 2.5 mg/dL  RENAL FUNCTION PANEL     Status: Abnormal   Collection Time    07/03/13  5:40 AM      Result Value Range   Sodium 136  135 - 145 mEq/L   Potassium 4.3  3.5 - 5.1 mEq/L   Chloride 100  96 - 112 mEq/L   CO2 25  19 - 32 mEq/L   Glucose, Bld 99  70 - 99 mg/dL   BUN 50 (*) 6 - 23 mg/dL   Creatinine, Ser 0.96 (*) 0.50 - 1.35 mg/dL   Calcium 8.5  8.4 - 04.5 mg/dL   Phosphorus 4.4  2.3 - 4.6 mg/dL   Albumin 2.4 (*) 3.5 - 5.2  g/dL   GFR calc non Af Amer 21 (*) >90 mL/min   GFR calc Af Amer 25 (*) >90 mL/min    Studies/Results: @RISRSLT24 @  Medications: Reviewed   @PROBHOSP @  1. Acute on chronic systolic CHF  Patient has improved from earlier this week  Still increased some  Will review with renal service diuretics  2.  HTN  BP is improviing  Still high  Will increase hydralazine to 100 tid.  Continue coreg 12.5 bid, amlodipine 10 qd and clonidine 0.2 tid  3.  Renal  Serologies and urine being collected  Will discuss wit hrenal  Patient close to going home  Will review with renal further diuresis and lasix dosing.  LOS: 6 days   Dietrich Pates 07/03/2013, 7:48 AM

## 2013-07-03 NOTE — Progress Notes (Addendum)
[facility] ROUNDING NOTE   Subjective:   Matthew Beard seen and examined at bedside this morning with his sister in law present in the room as well.  He reports feeling well with no complaints at this time.  He would like to go home soon but is anxious about his kidneys and blood pressure and social situation as well.  He has limited resources to be able to afford his medications. He denies fever, chills, N/V/D, chest pain, abdominal pain, or any urinary complaints at this time.  His SOB he says is much improved and he sleeps well at night.   Objective:  Vital signs in last 24 hours:  Temp:  [96 F (35.6 C)-97.7 F (36.5 C)] 97.7 F (36.5 C) (08/15 0627) Pulse Rate:  [62-68] 68 (08/15 0627) Resp:  [20] 20 (08/15 0627) BP: (137-152)/(88-102) 142/102 mmHg (08/15 0627) SpO2:  [96 %-100 %] 100 % (08/15 0627) Weight:  [177 lb 11.2 oz (80.604 kg)] 177 lb 11.2 oz (80.604 kg) (08/15 0627)  Weight change: 0.2 oz (0.004 kg) Filed Weights   07/02/13 0500 07/02/13 0647 07/03/13 0627  Weight: 177 lb 11.1 oz (80.6 kg) 176 lb 12.9 oz (80.2 kg) 177 lb 11.2 oz (80.604 kg)   Intake/Output: I/O last 3 completed shifts: In: 2102 [P.O.:2102] Out: 2800 [Urine:2800]   Intake/Output this shift:  Total I/O In: 480 [P.O.:480] Out: -   Physical Exam:  Vitals reviewed.  General: sitting up on side of bed, NAD  HEENT: PERRL, EOMI  Cardiac: RRR, no rubs, murmurs or gallops  Pulm: clear to auscultation bilaterally, no wheezes, rales, or rhonchi  Abd: soft, nontender, nondistended, BS present  Ext: warm and well perfused, mild b/l pitting edema of lower extremities, moving all 4 extremities, no tenderness to palpation  Neuro: alert and oriented X3, cranial nerves II-XII grossly intact, strength and sensation to light touch equal in bilateral upper and lower extremities  Basic Metabolic Panel:  Recent Labs Lab 06/28/13 0440  06/29/13 0345 06/30/13 0235 07/01/13 0406 07/02/13 0330  07/03/13 0540  NA 130*  < > 132* 134* 133* 132* 136  K 3.8  < > 3.3* 3.3* 3.4* 3.7 4.3  CL 92*  < > 96 99 99 98 100  CO2 17*  < > 22 22 24 22 25   GLUCOSE 90  < > 120* 107* 96 104* 99  BUN 77*  < > 78* 69* 56* 51* 50*  CREATININE 4.06*  < > 3.75* 3.48* 3.19* 3.30* 3.41*  CALCIUM 8.6  < > 8.0* 8.0* 8.3* 8.3* 8.5  MG 2.2  --  2.3  --   --   --  2.3  PHOS 6.9*  --  3.5  --   --   --  4.4  < > = values in this interval not displayed.  Liver Function Tests:  Recent Labs Lab 07/03/13 0540  ALBUMIN 2.4*   CBC:  Recent Labs Lab 06/27/13 2147 06/29/13 0345 06/30/13 0235 07/01/13 0406 07/02/13 0330  WBC 7.6 8.8 7.3 4.8 4.8  NEUTROABS  --   --  5.3  --   --   HGB 11.4* 10.8* 10.4* 10.3* 9.7*  HCT 32.7* 30.6* 30.4* 30.8* 28.6*  MCV 81.8 80.5 83.7 84.6 85.1  PLT 270 311 305 313 293   Cardiac Enzymes:  Recent Labs Lab 06/28/13 0050 06/28/13 0720 06/28/13 1152  TROPONINI <0.30 <0.30 <0.30   CBG:  Recent Labs Lab 06/28/13 0215  GLUCAP 80   Microbiology: Results for  orders placed during the hospital encounter of 06/27/13  MRSA PCR SCREENING     Status: None   Collection Time    06/28/13  2:15 AM      Result Value Range Status   MRSA by PCR NEGATIVE  NEGATIVE Final   Comment:            The GeneXpert MRSA Assay (FDA     approved for NASAL specimens     only), is one component of a     comprehensive MRSA colonization     surveillance program. It is not     intended to diagnose MRSA     infection nor to guide or     monitor treatment for     MRSA infections.   Coagulation Studies:  Recent Labs  07/02/13 0330  LABPROT 13.3  INR 1.03   Urinalysis:  Recent Labs  07/03/13 0138  COLORURINE YELLOW  LABSPEC 1.013  PHURINE 6.0  GLUCOSEU NEGATIVE  HGBUR LARGE*  BILIRUBINUR NEGATIVE  KETONESUR NEGATIVE  PROTEINUR 100*  UROBILINOGEN 0.2  NITRITE NEGATIVE  LEUKOCYTESUR TRACE*    Imaging: US Renal  07/02/2013   *RADIOLOGY REPORT*  Clinical Data:  Worsening renal insufficiency.  Hypertension.  RENAL/URINARY TRACT ULTRASOUND COMPLETE  Comparison:  08/23/2009 MRI.  Findings:  Right Kidney:  11.0 cm.  Increased echogenicity.  Normal renal cortical thickness. No hydronephrosis.  Left Kidney:  11.1 cm.  Increased renal echogenicity. No hydronephrosis.  Normal renal cortical thickness.  Bladder:  Within normal limits.  Note is made of trace fluid in Morison's pouch.  IMPRESSION: No hydronephrosis.  Increased renal echogenicity, suggesting medical renal disease.  Trace fluid within Morison's pouch.   Original Report Authenticated By: Jeronimo Greaves, M.D.    Medications:     . amLODipine  10 mg Oral Daily  . aspirin EC  325 mg Oral Daily  . carvedilol  12.5 mg Oral BID WC  . cloNIDine  0.2 mg Oral TID  . furosemide  80 mg Oral Daily  . hydrALAZINE  75 mg Oral Q8H   acetaminophen, labetalol  Assessment/ Plan:  Matthew Beard is a 37 y.o. male with a PMHx of uncontrolled HTN and medication non-complaince, who was admitted to [facility] on 06/27/2013 for evaluation of worsening SOB found to have CHF with EF 20%, malignant HTN, and CKD.   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 Cr on admission 3.88.  Endorses hx of gross hematuria. U/a large Hbg with 11-20 RBC with trace leukocytes and 3-6rbc. Renal ultrasound with no hydronephrosis, increased renal echogenicity, suggesting medical renal disease, and trace fluid with Morison's pouch. On lasix. -24 hour urine protein pending -ANA and complement levels pending  -trend renal function, albumin (2.4) -will need to eventually follow up in our renal office as an outpatient   Uncontrolled HTN--improving. Admitted for hypertensive emergency. Currently on BP meds including norvasc 10mg , coreg 12.5mg , clonidine 0.2mg  TID, lasix 80mg  qd and will also get IV lasix later today, and hydralazine 100mg  q8hours. Hx of significant non-compliance, therefore current extensive  regimen seems difficult for him to likely adhere too as an outpatient and afford his medications. Needs social work or case management support, regular pcp follow up that is affordable, and likely simplified regimen as much as possible. Consider orange card if he qualifies. Urine metanephrine studies from 2010 were significant for elevated normetanephrine of 1161 and total metanephrine of 1232. ?tumor with possible confirmation with plasma metanephrines and chromogranin a  needed.  -financial counselor discussion needed  -f/u urine metanephrine's and plasma metanephrine -CT abdomen and pelvis w/o contrast: normal adrenal glands and kidneys  Acute on chronic systolic CHF with EF 20%, moderate concentric hypertrophy, and diffuse hypokinesis per echo 8/10.  Spoke with Dr. Tenny Craw on the phone today in regards to volume status.  Will try additional lasix and continue to monitor renal function.  -management per cardiology   LOS: 6  Case discussed and patient seen with Dr. Hyman Hopes  Signed: Darden Palmer, MD PGY-2, Internal Medicine Resident Pager: [phone]  07/03/2013,2:07 PM

## 2013-07-04 ENCOUNTER — Encounter (HOSPITAL_COMMUNITY): Payer: Self-pay | Admitting: Physician Assistant

## 2013-07-04 DIAGNOSIS — I679 Cerebrovascular disease, unspecified: Secondary | ICD-10-CM

## 2013-07-04 DIAGNOSIS — E871 Hypo-osmolality and hyponatremia: Secondary | ICD-10-CM

## 2013-07-04 DIAGNOSIS — Z9119 Patient's noncompliance with other medical treatment and regimen: Secondary | ICD-10-CM

## 2013-07-04 DIAGNOSIS — R931 Abnormal findings on diagnostic imaging of heart and coronary circulation: Secondary | ICD-10-CM

## 2013-07-04 DIAGNOSIS — Z91199 Patient's noncompliance with other medical treatment and regimen due to unspecified reason: Secondary | ICD-10-CM

## 2013-07-04 DIAGNOSIS — I272 Pulmonary hypertension, unspecified: Secondary | ICD-10-CM

## 2013-07-04 DIAGNOSIS — I43 Cardiomyopathy in diseases classified elsewhere: Secondary | ICD-10-CM

## 2013-07-04 DIAGNOSIS — I119 Hypertensive heart disease without heart failure: Secondary | ICD-10-CM

## 2013-07-04 LAB — RENAL FUNCTION PANEL
CO2: 24 mEq/L (ref 19–32)
GFR calc Af Amer: 24 mL/min — ABNORMAL LOW (ref >90)
GFR calc non Af Amer: 21 mL/min — ABNORMAL LOW (ref >90)
Glucose, Bld: 133 mg/dL — ABNORMAL HIGH (ref 70–99)
Potassium: 4 mEq/L (ref 3.5–5.1)
Sodium: 134 mEq/L — ABNORMAL LOW (ref 135–145)

## 2013-07-04 LAB — COMPLEMENT, TOTAL: Compl, Total (CH50): 59 U/mL (ref 31–60)

## 2013-07-04 LAB — MAGNESIUM: Magnesium: 2.2 mg/dL (ref 1.5–2.5)

## 2013-07-04 MED ORDER — ASPIRIN 325 MG PO TBEC: 325.0000 mg | DELAYED_RELEASE_TABLET | Freq: Every day | ORAL | Status: DC

## 2013-07-04 MED ORDER — HYDRALAZINE HCL 100 MG PO TABS: 100.0000 mg | ORAL_TABLET | Freq: Three times a day (TID) | ORAL | Status: DC

## 2013-07-04 MED ORDER — CLONIDINE HCL 0.2 MG PO TABS: 0.2000 mg | ORAL_TABLET | Freq: Three times a day (TID) | ORAL | Status: DC

## 2013-07-04 MED ORDER — CARVEDILOL 12.5 MG PO TABS: 12.5000 mg | ORAL_TABLET | Freq: Two times a day (BID) | ORAL | Status: DC

## 2013-07-04 MED ORDER — FUROSEMIDE 80 MG PO TABS: 80.0000 mg | ORAL_TABLET | Freq: Every day | ORAL | Status: DC

## 2013-07-04 NOTE — Progress Notes (Signed)
[facility] ROUNDING NOTE   Subjective:   Matthew Beard seen and examined at bedside this morning.  He reports feeling well with no complaints at this time.  He would like to go home soon.  He denies fever, chills, N/V/D, chest pain, abdominal pain, or any urinary complaints at this time.  His SOB he says is much improved and he sleeps well at night.   Objective:  Vital signs in last 24 hours:  Temp:  [97.6 F (36.4 C)-98.8 F (37.1 C)] 98.4 F (36.9 C) (08/16 0543) Pulse Rate:  [69-78] 74 (08/16 0543) Resp:  [18-19] 18 (08/16 0543) BP: (137-161)/(66-97) 144/92 mmHg (08/16 0543) SpO2:  [99 %-100 %] 100 % (08/16 0543) Weight:  [178 lb 12.7 oz (81.1 kg)] 178 lb 12.7 oz (81.1 kg) (08/16 0543)  Weight change: 1 lb 1.5 oz (0.496 kg) Filed Weights   07/02/13 0647 07/03/13 0627 07/04/13 0543  Weight: 176 lb 12.9 oz (80.2 kg) 177 lb 11.2 oz (80.604 kg) 178 lb 12.7 oz (81.1 kg)   Intake/Output: I/O last 3 completed shifts: In: 2520 [P.O.:2360; Other:100; IV Piggyback:60] Out: 3075 [Urine:3075]   Intake/Output this shift:     Physical Exam:  Vitals reviewed.  General: sitting up on side of bed, NAD  HEENT: PERRL, EOMI  Cardiac: RRR, no rubs, murmurs or gallops  Pulm: clear to auscultation bilaterally, no wheezes, rales, or rhonchi  Abd: soft, nontender, nondistended, BS present  Ext: warm and well perfused, mild b/l pitting edema of lower extremities, moving all 4 extremities, no tenderness to palpation  Neuro: alert and oriented X3, cranial nerves II-XII grossly intact, strength and sensation to light touch equal in bilateral upper and lower extremities  Basic Metabolic Panel:  Recent Labs Lab 06/28/13 0440  06/29/13 0345  07/01/13 0406 07/02/13 0330 07/03/13 0540 07/03/13 1327 07/04/13 0640  NA 130*  < > 132*  < > 133* 132* 136 134* 134*  K 3.8  < > 3.3*  < > 3.4* 3.7 4.3 3.9 4.0  CL 92*  < > 96  < > 99 98 100 99 99  CO2 17*  < > 22  < > 24 22 25 22 24    GLUCOSE 90  < > 120*  < > 96 104* 99 157* 133*  BUN 77*  < > 78*  < > 56* 51* 50* 47* 47*  CREATININE 4.06*  < > 3.75*  < > 3.19* 3.30* 3.41* 3.37* 3.45*  CALCIUM 8.6  < > 8.0*  < > 8.3* 8.3* 8.5 8.3* 8.6  MG 2.2  --  2.3  --   --   --  2.3  --  2.2  PHOS 6.9*  --  3.5  --   --   --  4.4  --  3.6  < > = values in this interval not displayed.  Liver Function Tests:  Recent Labs Lab 07/03/13 0540 07/04/13 0640  ALBUMIN 2.4* 2.5*   CBC:  Recent Labs Lab 06/27/13 2147 06/29/13 0345 06/30/13 0235 07/01/13 0406 07/02/13 0330  WBC 7.6 8.8 7.3 4.8 4.8  NEUTROABS  --   --  5.3  --   --   HGB 11.4* 10.8* 10.4* 10.3* 9.7*  HCT 32.7* 30.6* 30.4* 30.8* 28.6*  MCV 81.8 80.5 83.7 84.6 85.1  PLT 270 311 305 313 293   Cardiac Enzymes:  Recent Labs Lab 06/28/13 0050 06/28/13 0720 06/28/13 1152  TROPONINI <0.30 <0.30 <0.30   CBG:  Recent Labs Lab 06/28/13  0215  GLUCAP 80   Microbiology: Results for orders placed during the hospital encounter of 06/27/13  MRSA PCR SCREENING     Status: None   Collection Time    06/28/13  2:15 AM      Result Value Range Status   MRSA by PCR NEGATIVE  NEGATIVE Final   Comment:            The GeneXpert MRSA Assay (FDA     approved for NASAL specimens     only), is one component of a     comprehensive MRSA colonization     surveillance program. It is not     intended to diagnose MRSA     infection nor to guide or     monitor treatment for     MRSA infections.   Coagulation Studies:  Recent Labs  07/02/13 0330  LABPROT 13.3  INR 1.03   Urinalysis:  Recent Labs  07/03/13 0138  COLORURINE YELLOW  LABSPEC 1.013  PHURINE 6.0  GLUCOSEU NEGATIVE  HGBUR LARGE*  BILIRUBINUR NEGATIVE  KETONESUR NEGATIVE  PROTEINUR 100*  UROBILINOGEN 0.2  NITRITE NEGATIVE  LEUKOCYTESUR TRACE*    Imaging: Ct Abdomen Pelvis Wo Contrast  07/03/2013   *RADIOLOGY REPORT*  Clinical Data: Hematuria  CT ABDOMEN AND PELVIS WITHOUT CONTRAST   Technique:  Multidetector CT imaging of the abdomen and pelvis was performed following the standard protocol without intravenous contrast.  Comparison: CT scan of June 05, 2008.  Findings: Cardiomegaly is noted. Mild subsegmental atelectasis is noted in the right lower lobe with associated minimal effusion.  No focal abnormality is seen involve the liver, spleen or pancreas on these unenhanced images.  No gallstones are noted.  Adrenal glands and kidneys appear normal.  No hydronephrosis or renal obstruction is noted.  No renal or ureteral calculi are noted.  The appendix appears normal.  No evidence of obstruction.  Urinary bladder appears normal.  Mild bilateral inguinal adenopathy is noted which is unchanged compared to prior exam.  No abnormal fluid collection is noted.  IMPRESSION: Cardiomegaly.  Mild subsegmental atelectasis seen in right lung base with associated minimal pleural effusion.  No significant abnormality seen in the abdomen or pelvis.   Original Report Authenticated By: Lupita Raider.,  M.D.   US Renal  07/02/2013   *RADIOLOGY REPORT*  Clinical Data: Worsening renal insufficiency.  Hypertension.  RENAL/URINARY TRACT ULTRASOUND COMPLETE  Comparison:  08/23/2009 MRI.  Findings:  Right Kidney:  11.0 cm.  Increased echogenicity.  Normal renal cortical thickness. No hydronephrosis.  Left Kidney:  11.1 cm.  Increased renal echogenicity. No hydronephrosis.  Normal renal cortical thickness.  Bladder:  Within normal limits.  Note is made of trace fluid in Morison's pouch.  IMPRESSION: No hydronephrosis.  Increased renal echogenicity, suggesting medical renal disease.  Trace fluid within Morison's pouch.   Original Report Authenticated By: Jeronimo Greaves, M.D.    Medications:     . amLODipine  10 mg Oral Daily  . aspirin EC  325 mg Oral Daily  . carvedilol  12.5 mg Oral BID WC  . cloNIDine  0.2 mg Oral TID  . furosemide  80 mg Oral Daily  . hydrALAZINE  100 mg Oral Q8H   acetaminophen,  labetalol  Assessment/ Plan:  Matthew Beard is a 37 y.o. male with a PMHx of uncontrolled HTN and medication non-complaince, who was admitted to [facility] on 06/27/2013 for evaluation of worsening SOB found to have CHF with EF 20%, malignant HTN, and CKD.  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 Slightly improved today.  Cr on admission 3.88.  Endorses hx of gross hematuria. U/a large Hbg with 11-20 RBC with trace leukocytes and 3-6rbc. 24 hour urine protein 2303mg .  Renal ultrasound with no hydronephrosis, increased renal echogenicity, suggesting medical renal disease, and trace fluid with Morison's pouch. On lasix.  Recent Labs Lab 07/01/13 0406 07/02/13 0330 07/03/13 0540 07/03/13 1327 07/04/13 0640  CREATININE 3.19* 3.30* 3.41* 3.37* 3.45*   -ANA and complement levels pending, if ANA positive, will likely need biopsy early next week -trend renal function, albumin (2.4) -will need to eventually follow up in our renal office as an outpatient--Dr. Hyman Hopes  Uncontrolled HTN--improving. Admitted for hypertensive emergency. Currently on BP meds including norvasc 10mg , coreg 12.5mg , clonidine 0.2mg  TID, lasix 80mg  qd and will also get IV lasix later today, and hydralazine 100mg  q8hours. Hx of significant non-compliance, therefore current extensive regimen seems difficult for him to likely adhere too as an outpatient and afford his medications. Needs social work or case management support, regular pcp follow up that is affordable, and likely simplified regimen as much as possible. Consider orange card if he qualifies. Urine metanephrine studies from 2010 were significant for elevated normetanephrine of 1161 and total metanephrine of 1232. ?tumor with possible confirmation with plasma metanephrines and chromogranin a needed. CT abdomen and pelvis w/o contrast: normal adrenal glands and kidneys -financial counselor discussion needed  -f/u urine  metanephrine's and plasma metanephrine pending  Acute on chronic systolic CHF with EF 20%, moderate concentric hypertrophy, and diffuse hypokinesis per echo 8/10.   -management per cardiology   LOS: 7  Case discussed and patient seen with Dr. Hyman Hopes  Signed: Darden Palmer, MD PGY-2, Internal Medicine Resident Pager: [phone]  07/04/2013,10:16 AM

## 2013-07-04 NOTE — Progress Notes (Signed)
I have seen and examined this patient and agree with the plan of care . 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 Diagnosis can be confirmed by renal biopsy although not sure that prognosis is going to be affected. Will need close nephrology follow up.  Matthew Beard W 07/04/2013, 12:02 PM

## 2013-07-04 NOTE — Progress Notes (Signed)
CSW received consult for pcp, CSW informed rn cm of patient needs as well as need for medication assistance.  .No further Clinical Social Work needs, signing off.   Catha Gosselin, LCSWA weekend covering CSW [phone] .07/04/2013 1233pm

## 2013-07-04 NOTE — Progress Notes (Signed)
   CARE MANAGEMENT NOTE 07/04/2013  Patient:  Matthew Beard, Matthew Beard   Account Number:  [id number]  Date Initiated:  06/29/2013  Documentation initiated by:  [facility]  Subjective/Objective Assessment:   Hypertensive crisis. On nicardipine drip.     Action/Plan:   Anticipated DC Date:  07/02/2013   Anticipated DC Plan:  HOME W HOME HEALTH SERVICES      DC Planning Services  CM consult  Medication Assistance  [facility] Program      Choice offered to / List presented to:             Status of service:  Completed, signed off Medicare Important Message given?   (If response is "NO", the following Medicare IM given date fields will be blank) Date Medicare IM given:   Date Additional Medicare IM given:    Discharge Disposition:  HOME/SELF CARE  Per UR Regulation:  Reviewed for med. necessity/level of care/duration of stay  If discussed at Long Length of Stay Meetings, dates discussed:    Comments:  07/04/2013 1545 Provided pt with MATCH for medications. Explained program can be used only once per year. Isidoro Donning RN CCM Case Mgmt phone [phone]  Contact:  Beckie Salts [phone]                 Mertz,Anthony Brother [phone]  06-30-13 2:20pm Avie Arenas, RNBSN [phone] Talked with patient and SO Judeth Cornfield in room. Patient sleepy - hard to stay awake to answer questions, when does gets SOB.  States has prescriptions for meds but just no money to fill.  States can afford 4 dollar prescriptions at [facility].  Financial counselor working with them on disability -  but will take more than 30 days.  Can assist for a month with meds on discharge but will still need something longer until disability picks up.  If possible - 4 dollar meds at [facility] would be beneficial on discharge.

## 2013-07-04 NOTE — Progress Notes (Signed)
Patient ID: Matthew Beard, male   DOB: 1976-05-14, 37 y.o.   MRN: [id number] Subjective: No complaints feels he is good to go home  Objective: Filed Vitals:   07/03/13 1735 07/03/13 1739 07/03/13 2008 07/04/13 0543  BP: 154/66  137/81 144/92  Pulse:  78 76 74  Temp:   98.8 F (37.1 C) 98.4 F (36.9 C)  TempSrc:   Oral Oral  Resp:   19 18  Height:      Weight:    178 lb 12.7 oz (81.1 kg)  SpO2:   100% 100%   Weight change: 1 lb 1.5 oz (0.496 kg)  Intake/Output Summary (Last 24 hours) at 07/04/13 0854 Last data filed at 07/04/13 4098  Gross per 24 hour  Intake   1940 ml  Output   2075 ml  Net   -135 ml    General: Alert, awake, oriented x3, in no acute distress Neck:  JVP is increased to 11 cm Heart: Regular rate and rhythm, without murmurs, rubs, gallops.  Lungs: Clear to auscultation.  No rales or wheezes. Exemities:  1+ edema.   Neuro: Grossly intact, nonfocal.  Tele:  SR Lab Results: Results for orders placed during the hospital encounter of 06/27/13 (from the past 24 hour(s))  BASIC METABOLIC PANEL     Status: Abnormal   Collection Time    07/03/13  1:27 PM      Result Value Range   Sodium 134 (*) 135 - 145 mEq/L   Potassium 3.9  3.5 - 5.1 mEq/L   Chloride 99  96 - 112 mEq/L   CO2 22  19 - 32 mEq/L   Glucose, Bld 157 (*) 70 - 99 mg/dL   BUN 47 (*) 6 - 23 mg/dL   Creatinine, Ser 1.19 (*) 0.50 - 1.35 mg/dL   Calcium 8.3 (*) 8.4 - 10.5 mg/dL   GFR calc non Af Amer 22 (*) >90 mL/min   GFR calc Af Amer 25 (*) >90 mL/min  RENAL FUNCTION PANEL     Status: Abnormal   Collection Time    07/04/13  6:40 AM      Result Value Range   Sodium 134 (*) 135 - 145 mEq/L   Potassium 4.0  3.5 - 5.1 mEq/L   Chloride 99  96 - 112 mEq/L   CO2 24  19 - 32 mEq/L   Glucose, Bld 133 (*) 70 - 99 mg/dL   BUN 47 (*) 6 - 23 mg/dL   Creatinine, Ser 1.47 (*) 0.50 - 1.35 mg/dL   Calcium 8.6  8.4 - 82.9 mg/dL   Phosphorus 3.6  2.3 - 4.6 mg/dL   Albumin 2.5 (*) 3.5 - 5.2 g/dL   GFR  calc non Af Amer 21 (*) >90 mL/min   GFR calc Af Amer 24 (*) >90 mL/min  MAGNESIUM     Status: None   Collection Time    07/04/13  6:40 AM      Result Value Range   Magnesium 2.2  1.5 - 2.5 mg/dL  PRO B NATRIURETIC PEPTIDE     Status: Abnormal   Collection Time    07/04/13  6:40 AM      Result Value Range   Pro B Natriuretic peptide (BNP) 6732.0 (*) 0 - 125 pg/mL    Scheduled Meds: . amLODipine  10 mg Oral Daily  . aspirin EC  325 mg Oral Daily  . carvedilol  12.5 mg Oral BID WC  . cloNIDine  0.2 mg Oral  TID  . furosemide  80 mg Oral Daily  . hydrALAZINE  100 mg Oral Q8H   Continuous Infusions:  PRN Meds:.acetaminophen, labetalol   Medications: Reviewed   @PROBHOSP @  1. Acute on chronic systolic CHF  Improved ACE contraindicated with GFR less than 30  2.  HTN  BP is improviing  On hydralazine  100 tid.  Continue coreg 12.5 bid, amlodipine 10 qd and clonidine 0.2 tid  3.  Renal  Serologies and urine being collected  Needs close f/u with renal Dr Lowell Guitar  Would make arrangements to place fistula   Matthew Beard 07/04/2013, 8:54 AM

## 2013-07-04 NOTE — Discharge Summary (Signed)
Discharge Summary   Patient ID: TYHIR SCHWAN,  MRN: [id number], DOB/AGE: Aug 16, 1976 37 y.o.  Admit date: 06/27/2013 Discharge date: 07/04/2013  Primary Physician: none Primary Cardiologist: Lovina Reach, MD   Discharge Diagnoses Principal Problem:   Hypertensive emergency Active Problems:   Acute systolic CHF (congestive heart failure)   Acute on chronic kidney disease, stage 4   Hypertensive cardiomyopathy   Uncontrolled hypertension   Anemia   Hyponatremia   History of noncompliance with medical treatment   Mild pulmonary hypertension   Abnormal finding on echocardiogram   Cerebrovascular disease  Allergies Allergies  Allergen Reactions  . Amoxicillin Hives    Diagnostic Studies/Procedures  PA/LATERAL CHEST X-RAY - 06/27/13  IMPRESSION:  Progressive cardiomegaly, diffuse interstitial prominence and small  bilateral pleural effusions most consistent with congestive heart  failure. Atypical infection could have this appearance. Of note,  the patient had a pericardial effusion on prior CT; a recurrent  pericardial effusion could account for the enlargement of the  cardiac silhouette.  PORTABLE CHEST X-RAY - 06/28/13  IMPRESSION:  Decreased pulmonary edema otherwise stable chest.  TRANSTHORACIC ECHOCARDIOGRAM - 06/28/13  - Left ventricle: The cavity size was moderately dilated. There was moderate concentric hypertrophy. The estimated ejection fraction was 20%. Diffuse hypokinesis. There was mild spontaneous echo contrast, indicative of stasis. - Aortic valve: Trivial regurgitation. - Mitral valve: Moderate regurgitation. - Left atrium: The atrium was moderately dilated. - Right ventricle: The cavity size was mildly dilated. Wall thickness was normal. - Right atrium: The atrium was moderately dilated. - Atrial septum: No defect or patent foramen ovale was identified. - Tricuspid valve: Severe regurgitation. - Pulmonary arteries: PA peak pressure: 52mm Hg  (S).  Impressions:  - The right ventricular systolic pressure was increased consistent with mild pulmonary hypertension.  NONCONTRAST HEAD CT - 06/29/13  Marked interval progression of periventricular white matter disease with small lacunar infarct adjacent to the frontal horn of the left lateral ventricle. The white matter disease is probably chronic ischemic but is nonspecific and could be associated with vasculitis, migraine headache, or demyelinating disease. Age indeterminant right thalamic lacunar infarct.  PORTABLE CHEST X-RAY - 06/29/13  IMPRESSION:  Cardiomegaly with increasing perihilar and mid/lower lung  heterogeneous opacities, likely pulmonary edema.  PORTABLE CHEST X-RAY - 06/30/13  IMPRESSION:  1. Findings, as above, suggestive of mild congestive heart  failure.  PORTABLE CHEST X-RAY - 07/01/13  IMPRESSION:  1. Mild cardiomegaly with pulmonary venous congestion, but no  frank pulmonary edema.  CAROTID DOPPLER U/S - 07/01/13  - The vertebral arteries appear patent with antegrade flow.  - Findings consistent with 1-39 percent stenosis involving the right internal carotid artery and the left internal carotid artery.  RENAL ULTRASOUND - 07/02/13  IMPRESSION:  No hydronephrosis.  Increased renal echogenicity, suggesting medical renal disease.  Trace fluid within Morison's pouch.  CT ABDOMEN/PELVIS - 07/03/13  IMPRESSION:  Cardiomegaly. Mild subsegmental atelectasis seen in right lung base with associated minimal pleural effusion. No significant abnormality seen in the abdomen or pelvis.  History of Present Illness  Justine SIM CHOQUETTE is a 37 y.o. male who was admitted to [facility] on 06/27/13 with the above problem list.  He has a history of CKD (previously stage III, prior baseline creatinine 2.2-2.3), uncontrolled hypertension, history of noncompliance and anemia. Previous echocardiogram 08/2009 revealed severe LVH, EF 65-70%, grade 1 diastolic dysfunction,  mild-moderate RVH. He has a remote history of pericardial effusion.   He presented to [facility] ED on  06/27/13 complaining of one month history of progressive shortness of breath, PND and orthopnea. Upon arrival in the ED, he was found to be markedly hypertensive. He denied any chest pain until presentation to the ED. This was rated at a 3/10, midsternal without radiation. He did admit to medication noncompliance. EKG indicated normal sinus rhythm, early repolarization and LVH and evidence of LV strain. Initial troponin I returned within normal limits. Pro BNP was markedly elevated at [address]. Chest x-ray as above indicated progressive cardiomegaly, bilateral pleural effusions and features consistent with CHF. No evidence of widened mediastinum. There were no pulse deficits. There was low suspicion for aortic dissection. The patient was admitted to the MICU by the critical care service. He was started on labetalol and nitroglycerin drips. He was diuresed well with IV Lasix and started on a nicardipine drip. The patient's blood pressure and symptoms improved.  Hospital Course  A 2-D echocardiogram was performed as above which revealed EF 20%, moderate LVH, moderate LV dilatation, diffuse hypokinesis. There was evidence of mild spontaneous echo contrast indicative of stasis. Moderate MR, moderate LA dilatation, mild RV dilatation, moderate RA dilatation, severe TR, PASP 52 mmHg. Two subsequent troponin returned within normal limits. The suspicion was that the patient's chest pain was due to markedly uncontrolled hypertension. Findings on echocardiogram were consistent with hypertensive cardiomyopathy. Dr. Daleen Squibb indicated that there was no need to pursue an ischemic workup.  The patient's renal function was noted to be significantly worse from prior readings (creatinine on admission 3.88, GFR 21). The renal service was consulted to assist with diuresis given this finding. He reported a prior history of gross  hematuria. The etiology of the patient's acute on chronic renal insufficiency was suspected to be due to hypertensive nephrosclerosis. Additionally, the patient did have a history of prior positive ANA with speckled pattern, titer of 1:1280, elevated RF. Negative dsDNA and ccp. This raised the suspicion for SLE. Repeat ANA and complement levels were ordered and pending at the time of this discharge summary. Regarding the patient's hypertension, he did have a prior history of significantly elevated urine and plasma metanephrine levels (2010 were significant for elevated normetanephrine of 1161 and total metanephrine of 1232). The recommendation was to repeat this to rule out underlying tumor contributing to the patient's markedly elevated blood pressure. The patient underwent an abdominal and pelvic CT scan which revealed no acute findings.  He continued to diuresis well on high doses of IV Lasix. Serial portable chest x-rays as above indicated improved pulmonary edema. He underwent a renal ultrasound revealing no evidence of hydronephrosis, and increased renal echogenicity consistent with medical renal disease. The patient reported a history of headaches with hypertension. The noncontrast head CT as above indicated lacunar infarcts consistent with white matter ischemic disease and a prior right thalamic lacunar infarct.   The patient was eventually transferred to telemetry and started on oral antihypertensives which are listed below. Given the patient's noncompliance, but concern was for the medication regimen not conducive to adherence. Additionally, the patient reported financial obstacles as a reason for noncompliance as well. He is currently working with [employer] regarding disability. There was a recommendation to consider applying 40 orange card in the meantime.  The patient did have evidence of hyponatremia (Na as low as 126) due to volume overload. This improved with diuresis. Na 134 today.  The patient's BUN and creatinine remained stable at 47-50/3.37-3.45.  He was evaluated by Dr. Eden Emms this morning and deemed stable for discharge. The patient's  renal function remained stable, however he will need to followup as an outpatient to continue the ongoing workup detailed above. There is mention of possible renal biopsy as well should the patient's ANA and complement levels return positive. A message was left with [facility] to call the patient for a followup appointment. He will also followup in our office in 1-2 weeks as part of transitional care management. He will be optimized on medical therapy. The patient should undergo repeat echocardiogram in 3 months after being compliant with antihypertensives and particularly carvedilol with reduced EF. His renal function occluded ACE inhibitor, ARB or spironolactone therapy. Should EF remains less than 35%, will need to consider ICD placement. The patient had a finding of spontaneous echo contrast indicative of stasis. The need for anticoagulation or TEE will be addressed on followup. Additionally, the aim this admission was to control the patient's blood pressure on oral antihypertensives. Consolidating the patient's medication regimen to foster compliance can be addressed on followup. The patient will need to establish with a pCP. Both case management and social work services have been consulted prior to his discharge to assist with medications and medical followup and is financial limitations. Note, medication adherence and avoidance of secondary triggers of hypertension (increased salt intake, etc.) has been stressed to the patient this admission. He is communicated that he will be compliant with his medications and my style modifications after discharge.  Discharge Vitals: Blood pressure 144/92, pulse 74, temperature 98.4 F (36.9 C), temperature source Oral, resp. rate 18, height 5\' 6"  (1.676 m), weight 81.1 kg (178 lb 12.7 oz), SpO2  100.00%.   Labs: Recent Labs     07/02/13  0330  WBC  4.8  HGB  9.7*  HCT  28.6*  MCV  85.1  PLT  293   Recent Labs Lab 07/03/13 0540 07/03/13 1327 07/04/13 0640  NA 136 134* 134*  K 4.3 3.9 4.0  CL 100 99 99  CO2 25 22 24   BUN 50* 47* 47*  CREATININE 3.41* 3.37* 3.45*  CALCIUM 8.5 8.3* 8.6  GLUCOSE 99 157* 133*   Recent Labs     07/02/13  1601  HGBA1C  5.4   Disposition:  Discharge Orders   Future Orders Complete By Expires   Diet - low sodium heart healthy  As directed    Increase activity slowly  As directed          Follow-up Information   Follow up with [facility]. (Office will call you with an appointment date & time. )    Contact information:   [address] [place] [address]       Schedule an appointment as soon as possible for a visit with POWELL,ALVIN C, MD. (As soon as possible for follow-up. )    Specialty:  Nephrology   Contact information:   [address] [facility] [place] [address] [phone]       Discharge Medications:    Medication List    STOP taking these medications       BC HEADACHE PO     EXCEDRIN PO     hydrochlorothiazide 25 MG tablet  Commonly known as:  HYDRODIURIL     metoprolol 50 MG tablet  Commonly known as:  LOPRESSOR      TAKE these medications       amLODipine 10 MG tablet  Commonly known as:  NORVASC  Take 10 mg by mouth daily.     aspirin 325  MG EC tablet  Take 1 tablet (325 mg total) by mouth daily.     carvedilol 12.5 MG tablet  Commonly known as:  COREG  Take 1 tablet (12.5 mg total) by mouth 2 (two) times daily with a meal.     cloNIDine 0.2 MG tablet  Commonly known as:  CATAPRES  Take 1 tablet (0.2 mg total) by mouth 3 (three) times daily.     furosemide 80 MG tablet  Commonly known as:  LASIX  Take 1 tablet (80 mg total) by mouth daily.     hydrALAZINE 100 MG tablet  Commonly known as:  APRESOLINE  Take 1 tablet (100 mg total) by  mouth every 8 (eight) hours.       Outstanding Labs/Studies: BMET on follow-up in 1-2 weeks  Duration of Discharge Encounter: Greater than 30 minutes including physician time.  Signed, R. Hurman Horn, PA-C 07/04/2013, 11:45 AM

## 2013-07-06 LAB — METANEPHRINES, PLASMA
Metanephrine, Free: <25 pg/mL (ref <=57)
Total Metanephrines-Plasma: 92 pg/mL (ref <=205)

## 2013-07-06 LAB — ANTI-NUCLEAR AB-TITER (ANA TITER): ANA Titer 1: >1:2560 {titer}

## 2013-07-07 LAB — METANEPHRINES, URINE, 24 HOUR: Metanephrines, Ur: 83 mcg/24 h (ref 36–190)

## 2013-08-26 ENCOUNTER — Encounter (INDEPENDENT_AMBULATORY_CARE_PROVIDER_SITE_OTHER): Payer: Self-pay

## 2013-08-26 ENCOUNTER — Encounter: Payer: Self-pay | Admitting: Cardiology

## 2013-08-26 ENCOUNTER — Ambulatory Visit (INDEPENDENT_AMBULATORY_CARE_PROVIDER_SITE_OTHER): Payer: Medicaid Other | Admitting: Cardiology

## 2013-08-26 VITALS — BP 240/120 | HR 80 | Ht 66.0 in | Wt 169.0 lb

## 2013-08-26 DIAGNOSIS — I10 Essential (primary) hypertension: Secondary | ICD-10-CM

## 2013-08-26 DIAGNOSIS — I255 Ischemic cardiomyopathy: Secondary | ICD-10-CM

## 2013-08-26 DIAGNOSIS — I2589 Other forms of chronic ischemic heart disease: Secondary | ICD-10-CM

## 2013-08-26 MED ORDER — CARVEDILOL 25 MG PO TABS: 25.0000 mg | ORAL_TABLET | Freq: Two times a day (BID) | ORAL | Status: DC

## 2013-08-26 MED ORDER — ISOSORBIDE DINITRATE 20 MG PO TABS: 20.0000 mg | ORAL_TABLET | Freq: Three times a day (TID) | ORAL | Status: DC

## 2013-08-26 MED ORDER — FUROSEMIDE 80 MG PO TABS: 80.0000 mg | ORAL_TABLET | Freq: Every day | ORAL | Status: DC

## 2013-08-26 NOTE — Progress Notes (Signed)
Patient ID: Matthew Beard, male   DOB: Oct 14, 1976, 37 y.o.   MRN: [id number]    Patient Name: Matthew Beard Date of Encounter: 08/26/2013  Primary Care Provider:  No PCP Per Patient Primary Cardiologist:  Tobias Alexander, H  Patient Profile  Hypertension, CHF  Problem List   Active Problems:  Chronic systolic CHF (congestive heart failure)  Acute on chronic kidney disease, stage 4  Hypertensive cardiomyopathy  Uncontrolled hypertension  Anemia  History of noncompliance with medical treatment  Mild pulmonary hypertension  Abnormal finding on echocardiogram  Cerebrovascular disease  Past Medical History  Diagnosis Date  . Hypertension   . CKD (chronic kidney disease), stage IV   . Normocytic anemia   . Hypertensive cardiomyopathy   . Pericardial effusion     Remote hx  . Cerebrovascular disease     Chronic lacunar infarcts   No past surgical history on file.  Allergies  Allergies  Allergen Reactions  . Amoxicillin Hives    HPI  37 year old male with hypertension that was diagnosed in his late twenties. He has has multiple hospital visits for hypertensive urgency in the past. During the last one in August 2014 when he presented with CHf due to hypertensive emergency and was found to have diffuse LV dysfunction (LV EF 20%). No prior cath or ischemic work up. He was diuresed, his BP meds were optimized and sent home. He is compliant with his meds. He currently feels well, but has dyspnea on strenuous exertion. No palpitations or syncope, no chest pain.   Home Medications  Prior to Admission medications   Medication Sig Start Date End Date Taking? Authorizing Provider  amLODipine (NORVASC) 10 MG tablet Take 10 mg by mouth daily.    Historical Provider, MD  aspirin EC 325 MG EC tablet Take 1 tablet (325 mg total) by mouth daily. 07/04/13   Roger A Arguello, PA-C  carvedilol (COREG) 25 MG tablet Take 1 tablet (25 mg total) by mouth 2 (two) times daily with a meal.  08/26/13   Lars Masson, MD  cloNIDine (CATAPRES) 0.2 MG tablet Take 1 tablet (0.2 mg total) by mouth 3 (three) times daily. 07/04/13   Roger A Arguello, PA-C  furosemide (LASIX) 80 MG tablet Take 1 tablet (80 mg total) by mouth daily. 08/26/13   Lars Masson, MD  hydrALAZINE (APRESOLINE) 100 MG tablet Take 1 tablet (100 mg total) by mouth every 8 (eight) hours. 07/04/13   Roger A Arguello, PA-C  isosorbide dinitrate (ISORDIL) 20 MG tablet Take 1 tablet (20 mg total) by mouth 3 (three) times daily. 08/26/13   Lars Masson, MD    Family History  Family History  Problem Relation Age of Onset  . Hypertension Mother   . Hypertension Father   . Stroke Mother   . Stroke Father   . Heart disease Mother   . Heart disease Father   . Hypertension Brother     Social History  History   Social History  . Marital Status: Single    Spouse Name: N/A    Number of Children: N/A  . Years of Education: N/A   Occupational History  . Not on file.   Social History Main Topics  . Smoking status: Never Smoker   . Smokeless tobacco: Not on file  . Alcohol Use: No  . Drug Use: No  . Sexual Activity:    Other Topics Concern  . Not on file   Social History Narrative  .  No narrative on file     Review of Systems General:  No chills, fever, night sweats or weight changes.  Cardiovascular:  No chest pain, dyspnea on exertion, edema, orthopnea, palpitations, paroxysmal nocturnal dyspnea. Dermatological: No rash, lesions/masses Respiratory: No cough, dyspnea Urologic: No hematuria, dysuria Abdominal:   No nausea, vomiting, diarrhea, bright red blood per rectum, melena, or hematemesis Neurologic:  No visual changes, wkns, changes in mental status. All other systems reviewed and are otherwise negative except as noted above.  Physical Exam  Blood pressure 240/120, pulse 80, height 5\' 6"  (1.676 m), weight 169 lb (76.658 kg).  General: Pleasant, NAD Psych: Normal affect. Neuro: Alert  and oriented X 3. Moves all extremities spontaneously. HEENT: Normal  Neck: Supple without bruits or JVD. Lungs:  Resp regular and unlabored, CTA. Heart: RRR no s3, s4, or murmurs. Abdomen: Soft, non-tender, non-distended, BS + x 4.  Extremities: No clubbing, cyanosis or edema. DP/PT/Radials 2+ and equal bilaterally.  Accessory Clinical Findings  ECG - SR, 80 BPM, LAE, LVH with repolarizations, QTc   Echocardiogram: 06/28/2013 Left ventricle: The cavity size was moderately dilated. There was moderate concentric hypertrophy. The estimated ejection fraction was 20%. Diffuse hypokinesis. There was mild spontaneous echo contrast, indicative of stasis. - Aortic valve: Trivial regurgitation. - Mitral valve: Moderate regurgitation. - Left atrium: The atrium was moderately dilated. - Right ventricle: The cavity size was mildly dilated. Wall thickness was normal. - Right atrium: The atrium was moderately dilated. - Atrial septum: No defect or patent foramen ovale was identified. - Tricuspid valve: Severe regurgitation. - Pulmonary arteries: PA peak pressure: 52mm Hg (S). Impressions:    Assessment & Plan  37 year old male   1. Chronic systolic CHF due to poorly controlled hypertension. The patient is currently euvolimic. We will continue the same dose of Lasix. We will uptitrate carvedilol to 25 mg PO BID, hold ACEI or ARB as his has CKD stage IV. We will order echocardiogramin early November to see if his EF has improved after 3 months of HF therapy and consider ICD placement. His EF improvement might not be optimal as his BP is still poorly controlled.  2. Hypertension - poorly controlled, today 240/120 mmHg. We will increase carvedilol to 25 mg BID and add ISDN 20 mg TID.   3. 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 He has an appointment with [place] kidney specialist tomorrow.  4. Prolonged QT- no palpitations, we will  increase carvedilol.  Follow up in 1 month   Tobias Alexander, Rexene Edison, MD 08/26/2013, 1:32 PM

## 2013-08-26 NOTE — Patient Instructions (Addendum)
Your physician has requested that you have a renal artery duplex. During this test, an ultrasound is used to evaluate blood flow to the kidneys. Allow one hour for this exam. Do not eat after midnight the day before and avoid carbonated beverages. Take your medications as you usually do.  Your physician has requested that you have an echocardiogram. Echocardiography is a painless test that uses sound waves to create images of your heart. It provides your doctor with information about the size and shape of your heart and how well your heart's chambers and valves are working. This procedure takes approximately one hour. There are no restrictions for this procedure.   Your physician has recommended you make the following change in your medication: increase Carvedilol to 25 mg twice daily and start taking Isosorbide 20 mg three times daily  Your physician recommends that you schedule a follow-up appointment in: 3 weeks

## 2013-08-31 ENCOUNTER — Encounter (HOSPITAL_COMMUNITY): Payer: Self-pay

## 2013-09-03 ENCOUNTER — Other Ambulatory Visit: Payer: Self-pay | Admitting: *Deleted

## 2013-09-03 DIAGNOSIS — Z01810 Encounter for preprocedural cardiovascular examination: Secondary | ICD-10-CM

## 2013-09-09 ENCOUNTER — Encounter (HOSPITAL_COMMUNITY): Payer: Self-pay | Admitting: Emergency Medicine

## 2013-09-09 ENCOUNTER — Emergency Department (INDEPENDENT_AMBULATORY_CARE_PROVIDER_SITE_OTHER)
Admission: EM | Admit: 2013-09-09 | Discharge: 2013-09-09 | Disposition: A | Discharge status: Home / Self Care | Payer: Medicaid Other | Source: Home / Self Care | Attending: Emergency Medicine | Admitting: Emergency Medicine

## 2013-09-09 ENCOUNTER — Emergency Department (HOSPITAL_COMMUNITY)
Admission: EM | Admit: 2013-09-09 | Discharge: 2013-09-09 | Discharge status: Against Medical Advice | Payer: Medicaid Other | Attending: Emergency Medicine | Admitting: Emergency Medicine

## 2013-09-09 DIAGNOSIS — N184 Chronic kidney disease, stage 4 (severe): Secondary | ICD-10-CM | POA: Insufficient documentation

## 2013-09-09 DIAGNOSIS — Z8673 Personal history of transient ischemic attack (TIA), and cerebral infarction without residual deficits: Secondary | ICD-10-CM

## 2013-09-09 DIAGNOSIS — M6281 Muscle weakness (generalized): Secondary | ICD-10-CM | POA: Insufficient documentation

## 2013-09-09 DIAGNOSIS — R040 Epistaxis: Secondary | ICD-10-CM

## 2013-09-09 DIAGNOSIS — D649 Anemia, unspecified: Secondary | ICD-10-CM | POA: Insufficient documentation

## 2013-09-09 DIAGNOSIS — Z7982 Long term (current) use of aspirin: Secondary | ICD-10-CM | POA: Insufficient documentation

## 2013-09-09 DIAGNOSIS — I428 Other cardiomyopathies: Secondary | ICD-10-CM | POA: Insufficient documentation

## 2013-09-09 DIAGNOSIS — Z79899 Other long term (current) drug therapy: Secondary | ICD-10-CM | POA: Insufficient documentation

## 2013-09-09 DIAGNOSIS — Z881 Allergy status to other antibiotic agents status: Secondary | ICD-10-CM | POA: Insufficient documentation

## 2013-09-09 DIAGNOSIS — Z8679 Personal history of other diseases of the circulatory system: Secondary | ICD-10-CM | POA: Insufficient documentation

## 2013-09-09 DIAGNOSIS — R4789 Other speech disturbances: Secondary | ICD-10-CM | POA: Insufficient documentation

## 2013-09-09 DIAGNOSIS — I129 Hypertensive chronic kidney disease with stage 1 through stage 4 chronic kidney disease, or unspecified chronic kidney disease: Secondary | ICD-10-CM | POA: Insufficient documentation

## 2013-09-09 DIAGNOSIS — R5381 Other malaise: Secondary | ICD-10-CM | POA: Insufficient documentation

## 2013-09-09 DIAGNOSIS — R5383 Other fatigue: Secondary | ICD-10-CM

## 2013-09-09 MED ORDER — TETRACAINE HCL 0.5 % OP SOLN
OPHTHALMIC | Status: AC
  Filled 2013-09-09: qty 2

## 2013-09-09 NOTE — ED Notes (Signed)
Pt in c/o intermittent nose bleed over the last two week, no bleeding at this time, sent from urgent care for further evaluation due to no source of bleeding noted upon exam

## 2013-09-09 NOTE — ED Provider Notes (Signed)
Chief Complaint:   Chief Complaint  Patient presents with  . Epistaxis    History of Present Illness:   Matthew Beard is a 37 year old male who presents today with with a two-week history of intermittent bleeding from his left nostril. Sometimes he also bleeds from his right nostril and often he spits up clots of blood in. He has had a history of severe hypertension, recent CVA, and history of chronic kidney disease, with a creatinine of 3.88 in August. He denies any recent trauma to the nose, headache, or new neurological symptoms. Since his stroke he's had slurred speech, and left facial and arm weakness. He seems very lethargic today. His girlfriend is with him. She states that his neurological status seems stable with the lethargy is a new finding.  Review of Systems:  Other than noted above, the patient denies any of the following symptoms: Systemic:  No fevers, chills, sweats, weight loss or gain, fatigue, or tiredness. Eye:  No redness, pain, discharge, itching, blurred vision, or diplopia. ENT:  No headache, nasal congestion, sneezing, itching, epistaxis, ear pain, congestion, decreased hearing, ringing in ears, vertigo, or tinnitus.  No oral lesions, sore throat, pain on swallowing, or hoarseness. Neck:  No mass, tenderness or adenopathy. Lungs:  No coughing, wheezing, or shortness of breath. Skin:  No rash or itching.  PMFSH:  Past medical history, family history, social history, meds, and allergies were reviewed. Current meds include aspirin, clonidine, hydralazine, amlodipine, carvedilol, furosemide, and Isordil. He has chronic kidney disease, hypertension, anemia, cardiomyopathy, and history of a stroke.  Physical Exam:   Vital signs:  BP 151/91  Pulse 88  Temp(Src) 98.1 F (36.7 C) (Oral)  Resp 20  SpO2 100% General:  He is very lethargic and speaks with a slurred speech.  In no distress.  Skin warm and diaphoretic. Eye:  PERRL, full EOMs, lids and conjunctiva normal.    ENT:  TMs and canals clear.  He has lots of dried blood and clots in both nostrils. These were removed and the mucosa appears normal with no obvious anterior bleeding. Examining his mouth he's got lots of blood in his mouth and draining down the posterior pharynx suggesting a posterior bleed.  No cranial or facial pain to palplation. Neck:  Supple, full ROM.  No adenopathy, tenderness or mass.  Thyroid normal. Lungs:  Breath sounds clear and equal bilaterally.  No wheezes, rales or rhonchi. Heart:  Rhythm regular, without extrasystoles.  No gallops or murmers. Neurological: He is lethargic and drowsy. There is left facial weakness and left arm drift. His hand grip on left is weak. According to his girlfriend, but this is old, having been present since his stroke in August. Skin:  Clear, warm and dry.  Course in Urgent Care Center:   Exam of the nose reveals no anterior source of the bleeding suggesting a posterior bleed.  Assessment:  The primary encounter diagnosis was Acute posterior epistaxis. Diagnoses of History of CVA (cerebrovascular accident) and Lethargy were also pertinent to this visit.  He has a posterior bleed but many other risk factors including chronic kidney disease, severe hypertension which appears to be fairly well controlled and a history of recent CVA. Plus he has a new onset of lethargy.  Plan:   The patient was transferred to the ED via shuttle in stable condition.  Medical Decision Making:  37 year old male with severe HT and history of recent CVA presents with 2 week history of intermittent bleeding from left  nostril.  Exam fails to reveal any anterior source of bleeding suggesting posterior bleed.  What worries me is that he seems very drowsy.  He also has slurred speech and left facial and arm weakness.  His girlfriend says this is no different from his neurological state when he was discharged from the hospital in August. The drowsiness is new.          Reuben Likes, MD 09/09/13 [phone]

## 2013-09-09 NOTE — ED Notes (Signed)
Pt c/o intermittent nose bleeds onset 2 weeks Sxs also include vomiting due to the blood Denies: inj/trauma, f/n/d Reports left nostril/nare is the one that always bleeds He is alert w/no signs of acute distress.

## 2013-09-09 NOTE — ED Provider Notes (Signed)
CSN: [id number]     Arrival date & time 09/09/13  1017 History  This chart was scribed for non-physician practitioner Junious Silk, PA-C working with Laray Anger, DO by Danella Maiers, ED Scribe. This patient was seen in room TR05C/TR05C and the patient's care was started at 10:28 AM.   Chief Complaint  Patient presents with  . Epistaxis   The history is provided by the patient. No language interpreter was used.   HPI Comments: Matthew Beard is a 37 y.o. male with a h/o CVA who presents to the Emergency Department complaining of intermittent epistaxis for the past two weeks lasting 10-15 minutes at a time. He had an episode this morning that woke him from sleep. It is light bleeding. It comes down into his mouth and down the back of his throat. He has a recent h/o CVA two months ago. He was sent here from Urgent Care.He states holding his head back helps it stop. He denies passing clots.  He is not on any blood thinners. He denies any deficits or changes in baseline weakness in his arms or legs. He has not had any cold symptoms recently.  Past Medical History  Diagnosis Date  . Hypertension   . CKD (chronic kidney disease), stage IV   . Normocytic anemia   . Hypertensive cardiomyopathy   . Pericardial effusion     Remote hx  . Cerebrovascular disease     Chronic lacunar infarcts   History reviewed. No pertinent past surgical history. Family History  Problem Relation Age of Onset  . Hypertension Mother   . Hypertension Father   . Stroke Mother   . Stroke Father   . Heart disease Mother   . Heart disease Father   . Hypertension Brother    History  Substance Use Topics  . Smoking status: Never Smoker   . Smokeless tobacco: Not on file  . Alcohol Use: No    Review of Systems  Constitutional: Negative for fever and chills.  HENT: Positive for nosebleeds. Negative for rhinorrhea and sore throat.   Respiratory: Negative for choking and shortness of breath.   All other  systems reviewed and are negative.    Allergies  Amoxicillin  Home Medications   Current Outpatient Rx  Name  Route  Sig  Dispense  Refill  . amLODipine (NORVASC) 10 MG tablet   Oral   Take 10 mg by mouth daily.         Marland Kitchen aspirin EC 325 MG EC tablet   Oral   Take 1 tablet (325 mg total) by mouth daily.   30 tablet   0   . carvedilol (COREG) 25 MG tablet   Oral   Take 1 tablet (25 mg total) by mouth 2 (two) times daily with a meal.   60 tablet   3     Increase in dose   . cloNIDine (CATAPRES) 0.2 MG tablet   Oral   Take 1 tablet (0.2 mg total) by mouth 3 (three) times daily.   90 tablet   3   . furosemide (LASIX) 80 MG tablet   Oral   Take 1 tablet (80 mg total) by mouth daily.   30 tablet   3   . hydrALAZINE (APRESOLINE) 100 MG tablet   Oral   Take 1 tablet (100 mg total) by mouth every 8 (eight) hours.   90 tablet   3   . isosorbide dinitrate (ISORDIL) 20 MG tablet   Oral  Take 1 tablet (20 mg total) by mouth 3 (three) times daily.   90 tablet   3    BP 125/78  Pulse 67  Temp(Src) 97.7 F (36.5 C) (Oral)  Resp 20  Ht 5\' 6"  (1.676 m)  Wt 170 lb (77.111 kg)  BMI 27.45 kg/m2  SpO2 100% Physical Exam  Nursing note and vitals reviewed. Constitutional: He is oriented to person, place, and time. He appears well-developed and well-nourished. No distress.  HENT:  Head: Normocephalic and atraumatic.  Right Ear: External ear normal.  Left Ear: External ear normal.  Nose: Nose normal.  No blood visualized in nares, no septal hematoma, blood visualized in mouth. Good strength 5/5 bilaterally, strength 5/5 in extremities.  Eyes: Conjunctivae are normal.  Neck: Normal range of motion. No tracheal deviation present.  Cardiovascular: Normal rate, regular rhythm and normal heart sounds.   Pulmonary/Chest: Effort normal and breath sounds normal. No stridor.  Abdominal: Soft. He exhibits no distension. There is no tenderness.  Musculoskeletal: Normal range  of motion.  Neurological: He is alert and oriented to person, place, and time.  Skin: Skin is warm and dry. He is not diaphoretic.  Psychiatric: He has a normal mood and affect. His behavior is normal.    ED Course  Procedures (including critical care time) Medications - No data to display  DIAGNOSTIC STUDIES: Oxygen Saturation is 100% on RA, normal by my interpretation.    COORDINATION OF CARE: 11:07 AM- Discussed treatment plan with pt. Pt agrees to plan.    Labs Review Labs Reviewed - No data to display Imaging Review No results found.  EKG Interpretation   None       MDM  No diagnosis found.  Patient presents for evaluation of epistaxis sent from urgent care. At urgent care it was noted that he had left-sided arm weakness, slurred speech, lethargy. I discussed with patient that I would like to admit him for a TIA workup. The patient eloped prior to workup and admission. I did not have a chance to discuss the risks of leaving with the patient as he eloped without notifying anyone.    I personally performed the services described in this documentation, which was scribed in my presence. The recorded information has been reviewed and is accurate.     Mora Bellman, PA-C 09/09/13 1621

## 2013-09-09 NOTE — ED Notes (Signed)
Pt not in room. Appears to have left.

## 2013-09-09 NOTE — ED Notes (Signed)
Pt sent from [facility] for recurring nosebleeds. Dr. Lorenz Coaster at [facility] felt the pt was lethargic so sent pt for further testing. No bleeding at present. Family states the pt is "just sleepy...he worked late last night".

## 2013-09-09 NOTE — ED Notes (Signed)
Pt apparently eloped after being told he was going to be admitted.

## 2013-09-11 ENCOUNTER — Inpatient Hospital Stay (HOSPITAL_BASED_OUTPATIENT_CLINIC_OR_DEPARTMENT_OTHER)
Admission: EM | Admit: 2013-09-11 | Discharge: 2013-09-15 | DRG: 683 | Disposition: A | Discharge status: Home / Self Care | Payer: Medicaid Other | Attending: Internal Medicine | Admitting: Internal Medicine

## 2013-09-11 ENCOUNTER — Encounter (HOSPITAL_BASED_OUTPATIENT_CLINIC_OR_DEPARTMENT_OTHER): Payer: Self-pay | Admitting: Emergency Medicine

## 2013-09-11 DIAGNOSIS — M359 Systemic involvement of connective tissue, unspecified: Secondary | ICD-10-CM | POA: Diagnosis present

## 2013-09-11 DIAGNOSIS — I10 Essential (primary) hypertension: Secondary | ICD-10-CM

## 2013-09-11 DIAGNOSIS — Z7982 Long term (current) use of aspirin: Secondary | ICD-10-CM

## 2013-09-11 DIAGNOSIS — M351 Other overlap syndromes: Secondary | ICD-10-CM

## 2013-09-11 DIAGNOSIS — D631 Anemia in chronic kidney disease: Secondary | ICD-10-CM | POA: Diagnosis present

## 2013-09-11 DIAGNOSIS — I509 Heart failure, unspecified: Secondary | ICD-10-CM | POA: Diagnosis present

## 2013-09-11 DIAGNOSIS — D62 Acute posthemorrhagic anemia: Secondary | ICD-10-CM | POA: Diagnosis not present

## 2013-09-11 DIAGNOSIS — I679 Cerebrovascular disease, unspecified: Secondary | ICD-10-CM

## 2013-09-11 DIAGNOSIS — D500 Iron deficiency anemia secondary to blood loss (chronic): Secondary | ICD-10-CM | POA: Diagnosis present

## 2013-09-11 DIAGNOSIS — N184 Chronic kidney disease, stage 4 (severe): Secondary | ICD-10-CM | POA: Diagnosis present

## 2013-09-11 DIAGNOSIS — I130 Hypertensive heart and chronic kidney disease with heart failure and stage 1 through stage 4 chronic kidney disease, or unspecified chronic kidney disease: Secondary | ICD-10-CM | POA: Diagnosis present

## 2013-09-11 DIAGNOSIS — Z8673 Personal history of transient ischemic attack (TIA), and cerebral infarction without residual deficits: Secondary | ICD-10-CM

## 2013-09-11 DIAGNOSIS — I161 Hypertensive emergency: Secondary | ICD-10-CM

## 2013-09-11 DIAGNOSIS — Z881 Allergy status to other antibiotic agents status: Secondary | ICD-10-CM

## 2013-09-11 DIAGNOSIS — I5022 Chronic systolic (congestive) heart failure: Secondary | ICD-10-CM | POA: Diagnosis present

## 2013-09-11 DIAGNOSIS — R040 Epistaxis: Secondary | ICD-10-CM | POA: Diagnosis present

## 2013-09-11 DIAGNOSIS — D649 Anemia, unspecified: Secondary | ICD-10-CM

## 2013-09-11 DIAGNOSIS — Z9119 Patient's noncompliance with other medical treatment and regimen: Secondary | ICD-10-CM

## 2013-09-11 DIAGNOSIS — D8989 Other specified disorders involving the immune mechanism, not elsewhere classified: Secondary | ICD-10-CM | POA: Diagnosis present

## 2013-09-11 DIAGNOSIS — N179 Acute kidney failure, unspecified: Secondary | ICD-10-CM

## 2013-09-11 DIAGNOSIS — I160 Hypertensive urgency: Secondary | ICD-10-CM

## 2013-09-11 DIAGNOSIS — I428 Other cardiomyopathies: Secondary | ICD-10-CM | POA: Diagnosis present

## 2013-09-11 DIAGNOSIS — I429 Cardiomyopathy, unspecified: Secondary | ICD-10-CM | POA: Diagnosis present

## 2013-09-11 HISTORY — DX: Other cardiomyopathies: I42.8

## 2013-09-11 LAB — BASIC METABOLIC PANEL
Calcium: 8.7 mg/dL (ref 8.4–10.5)
GFR calc Af Amer: 19 mL/min — ABNORMAL LOW (ref >90)
GFR calc non Af Amer: 16 mL/min — ABNORMAL LOW (ref >90)
Potassium: 3.9 mEq/L (ref 3.5–5.1)
Sodium: 140 mEq/L (ref 135–145)

## 2013-09-11 LAB — PROTIME-INR
INR: 1.04 (ref 0.00–1.49)
Prothrombin Time: 13.4 seconds (ref 11.6–15.2)

## 2013-09-11 LAB — CBC WITH DIFFERENTIAL/PLATELET
Basophils Absolute: 0 10*3/uL (ref 0.0–0.1)
Basophils Relative: 0 % (ref 0–1)
Eosinophils Absolute: 0.1 10*3/uL (ref 0.0–0.7)
Lymphocytes Relative: 18 % (ref 12–46)
MCH: 26.9 pg (ref 26.0–34.0)
MCHC: 31.7 g/dL (ref 30.0–36.0)
Neutro Abs: 4.1 10*3/uL (ref 1.7–7.7)
Neutrophils Relative %: 71 % (ref 43–77)
Platelets: 215 10*3/uL (ref 150–400)
RDW: 15.7 % — ABNORMAL HIGH (ref 11.5–15.5)

## 2013-09-11 LAB — APTT: aPTT: 31 seconds (ref 24–37)

## 2013-09-11 MED ORDER — FUROSEMIDE 80 MG PO TABS
80.0000 mg | ORAL_TABLET | Freq: Every day | ORAL | Status: DC
  Administered 2013-09-12 – 2013-09-15 (×4): 80 mg via ORAL
  Filled 2013-09-11 (×4): qty 1

## 2013-09-11 MED ORDER — LABETALOL HCL 5 MG/ML IV SOLN
0.5000 mg/min | INTRAVENOUS | Status: DC
  Administered 2013-09-12: 2 mg/min via INTRAVENOUS
  Administered 2013-09-12: 1.5 mg/min via INTRAVENOUS
  Filled 2013-09-11 (×3): qty 100

## 2013-09-11 MED ORDER — ACETAMINOPHEN 650 MG RE SUPP: 650.0000 mg | Freq: Four times a day (QID) | RECTAL | Status: DC | PRN

## 2013-09-11 MED ORDER — ZOLPIDEM TARTRATE 5 MG PO TABS: 5.0000 mg | ORAL_TABLET | Freq: Every evening | ORAL | Status: DC | PRN

## 2013-09-11 MED ORDER — DOXYCYCLINE HYCLATE 100 MG PO TABS
100.0000 mg | ORAL_TABLET | Freq: Two times a day (BID) | ORAL | Status: DC
  Administered 2013-09-12 – 2013-09-15 (×7): 100 mg via ORAL
  Filled 2013-09-11 (×9): qty 1

## 2013-09-11 MED ORDER — ONDANSETRON 4 MG PO TBDP
4.0000 mg | ORAL_TABLET | Freq: Once | ORAL | Status: AC
Start: 2013-09-11 — End: 2013-09-11
  Administered 2013-09-11: 4 mg via ORAL
  Filled 2013-09-11: qty 1

## 2013-09-11 MED ORDER — HYDRALAZINE HCL 20 MG/ML IJ SOLN
10.0000 mg | Freq: Once | INTRAMUSCULAR | Status: AC
  Administered 2013-09-11: 10 mg via INTRAVENOUS
  Filled 2013-09-11: qty 1

## 2013-09-11 MED ORDER — ONDANSETRON HCL 4 MG PO TABS: 4.0000 mg | ORAL_TABLET | Freq: Four times a day (QID) | ORAL | Status: DC | PRN

## 2013-09-11 MED ORDER — HYDRALAZINE HCL 50 MG PO TABS
100.0000 mg | ORAL_TABLET | Freq: Three times a day (TID) | ORAL | Status: DC
  Administered 2013-09-12 – 2013-09-15 (×10): 100 mg via ORAL
  Filled 2013-09-11 (×14): qty 2

## 2013-09-11 MED ORDER — LORAZEPAM 2 MG/ML IJ SOLN
1.0000 mg | Freq: Once | INTRAMUSCULAR | Status: AC
  Administered 2013-09-11: 1 mg via INTRAVENOUS
  Filled 2013-09-11: qty 1

## 2013-09-11 MED ORDER — LABETALOL HCL 5 MG/ML IV SOLN
10.0000 mg | Freq: Once | INTRAVENOUS | Status: AC
  Administered 2013-09-11: 10 mg via INTRAVENOUS
  Filled 2013-09-11: qty 4

## 2013-09-11 MED ORDER — HYDROMORPHONE HCL PF 1 MG/ML IJ SOLN: 0.5000 mg | INTRAMUSCULAR | Status: DC | PRN

## 2013-09-11 MED ORDER — ALUM & MAG HYDROXIDE-SIMETH 200-200-20 MG/5ML PO SUSP: 30.0000 mL | Freq: Four times a day (QID) | ORAL | Status: DC | PRN

## 2013-09-11 MED ORDER — CARVEDILOL 25 MG PO TABS
25.0000 mg | ORAL_TABLET | Freq: Two times a day (BID) | ORAL | Status: DC
  Administered 2013-09-12 – 2013-09-15 (×8): 25 mg via ORAL
  Filled 2013-09-11 (×10): qty 1

## 2013-09-11 MED ORDER — SODIUM CHLORIDE 0.9 % IV SOLN
INTRAVENOUS | Status: DC
  Administered 2013-09-13: 15:00:00 10 mL/h via INTRAVENOUS

## 2013-09-11 MED ORDER — OXYCODONE HCL 5 MG PO TABS: 5.0000 mg | ORAL_TABLET | ORAL | Status: DC | PRN

## 2013-09-11 MED ORDER — ONDANSETRON HCL 4 MG/2ML IJ SOLN: 4.0000 mg | Freq: Four times a day (QID) | INTRAMUSCULAR | Status: DC | PRN

## 2013-09-11 MED ORDER — ISOSORBIDE DINITRATE 20 MG PO TABS
20.0000 mg | ORAL_TABLET | Freq: Three times a day (TID) | ORAL | Status: DC
  Administered 2013-09-12 – 2013-09-15 (×11): 20 mg via ORAL
  Filled 2013-09-11 (×13): qty 1

## 2013-09-11 MED ORDER — ACETAMINOPHEN 325 MG PO TABS: 650.0000 mg | ORAL_TABLET | Freq: Four times a day (QID) | ORAL | Status: DC | PRN

## 2013-09-11 MED ORDER — CLONIDINE HCL 0.2 MG PO TABS
0.2000 mg | ORAL_TABLET | Freq: Three times a day (TID) | ORAL | Status: DC
  Administered 2013-09-12 – 2013-09-15 (×11): 0.2 mg via ORAL
  Filled 2013-09-11 (×13): qty 1

## 2013-09-11 MED ORDER — LORAZEPAM 1 MG PO TABS
1.0000 mg | ORAL_TABLET | Freq: Once | ORAL | Status: DC
  Filled 2013-09-11: qty 1

## 2013-09-11 MED ORDER — AMLODIPINE BESYLATE 10 MG PO TABS
10.0000 mg | ORAL_TABLET | Freq: Every day | ORAL | Status: DC
  Administered 2013-09-12 – 2013-09-15 (×4): 10 mg via ORAL
  Filled 2013-09-11 (×4): qty 1

## 2013-09-11 MED ORDER — CLONIDINE HCL 0.1 MG PO TABS
0.2000 mg | ORAL_TABLET | Freq: Once | ORAL | Status: AC
  Administered 2013-09-11: 0.2 mg via ORAL
  Filled 2013-09-11: qty 2

## 2013-09-11 MED ORDER — MORPHINE SULFATE 4 MG/ML IJ SOLN
4.0000 mg | Freq: Once | INTRAMUSCULAR | Status: AC
  Administered 2013-09-11: 4 mg via INTRAVENOUS
  Filled 2013-09-11: qty 1

## 2013-09-11 NOTE — ED Notes (Signed)
Pt encouraged to apply pressure to nose

## 2013-09-11 NOTE — ED Notes (Signed)
Removal of rhinorockets per md and replacement with nasal packing.  Suction to bedside.  Pt consistently spitting up blood clots.  Washcloths provided and water to swish and spit with.

## 2013-09-11 NOTE — H&P (Addendum)
PULMONARY  / CRITICAL CARE MEDICINE  Name: Matthew Beard MRN: [id number] DOB: 1976-04-07    ADMISSION DATE:  09/11/2013 PRIMARY SERVICE: PCCM  CHIEF COMPLAINT:  Epistaxis  BRIEF PATIENT DESCRIPTION: 37 M with CKD transferred for hypertensive urgency and refractory epistaxis.  SIGNIFICANT EVENTS / STUDIES:  1. Packed by ENT 09/11/2013  LINES / TUBES: 1. PIV  CULTURES: 1. None  ANTIBIOTICS: 1. Doxycycline 10/24 -  HISTORY OF PRESENT ILLNESS:  [name] is a 37 yo M with NICM, CKD4, and poorly controlled hypertension who presented to an OSH with persistent epistaxis. The current episode of epistaxis began this morning at 7 am when the patient was sleeping; he has had intermittent episodes for 2 weeks which spontaneously resolved. He was subsequently found to have significantly elevated blood pressure. He denies recent trauma, intranasal substance use, or use of drying O2 devices. He has never had issues with bleeding before this last round of epistaxis. He denies headache, vision changes, chest pain, or SOB. He denies recent medication non-compliance.   PAST MEDICAL HISTORY :  Past Medical History  Diagnosis Date  . Hypertension   . CKD (chronic kidney disease), stage IV   . Normocytic anemia   . Pericardial effusion     Remote hx  . Cerebrovascular disease     Chronic lacunar infarcts  . NICM (nonischemic cardiomyopathy)     History reviewed. No pertinent past surgical history.  Prior to Admission medications   Medication Sig Start Date End Date Taking? Authorizing Provider  amLODipine (NORVASC) 10 MG tablet Take 10 mg by mouth daily.   Yes Historical Provider, MD  carvedilol (COREG) 25 MG tablet Take 1 tablet (25 mg total) by mouth 2 (two) times daily with a meal. 08/26/13  Yes Lars Masson, MD  cloNIDine (CATAPRES) 0.2 MG tablet Take 1 tablet (0.2 mg total) by mouth 3 (three) times daily. 07/04/13  Yes Roger A Arguello, PA-C  furosemide (LASIX) 80 MG tablet Take 1  tablet (80 mg total) by mouth daily. 08/26/13  Yes Lars Masson, MD  hydrALAZINE (APRESOLINE) 100 MG tablet Take 1 tablet (100 mg total) by mouth every 8 (eight) hours. 07/04/13  Yes Roger A Arguello, PA-C  isosorbide dinitrate (ISORDIL) 20 MG tablet Take 1 tablet (20 mg total) by mouth 3 (three) times daily. 08/26/13  Yes Lars Masson, MD    Allergies  Allergen Reactions  . Amoxicillin Hives    FAMILY HISTORY:  Family History  Problem Relation Age of Onset  . Hypertension Mother   . Hypertension Father   . Stroke Mother   . Stroke Father   . Heart disease Mother   . Heart disease Father   . Hypertension Brother     SOCIAL HISTORY:  reports that he has never smoked. He has never used smokeless tobacco. He reports that he does not drink alcohol or use illicit drugs.  REVIEW OF SYSTEMS:  A 12-system ROS was conducted and, unless otherwise specified in the HPI, was negative.    PHYSICAL EXAM  VITAL SIGNS: Temp:  [97.7 F (36.5 C)-98.7 F (37.1 C)] 97.7 F (36.5 C) (10/24 2222) Pulse Rate:  [8-99] 99 (10/24 2222) Resp:  [21] 21 (10/24 1933) BP: (170-208)/(115-132) 194/115 mmHg (10/24 2100) SpO2:  [93 %-100 %] 100 % (10/24 2100) Weight:  [160 lb 0.9 oz (72.6 kg)-170 lb (77.111 kg)] 160 lb 0.9 oz (72.6 kg) (10/24 2222)  HEMODYNAMICS:    VENTILATOR SETTINGS:    INTAKE / OUTPUT:  Intake/Output   None     PHYSICAL EXAMINATION: General:  WDWN M in NAD Neuro:  Intact HEENT:  Sclera anicteric, conjunctiva pale. Nares packed bilaterally with saturation of packing with blood. MMM, OP clear.  Neck:  Trachea supple and midline, (-) LAN, (-) JVD Cardiovascular:  RRR, NS1/S2, (-) MRG Lungs:  CTAB Abdomen:  S/NT/ND/(+)BS Musculoskeletal:  (-) C/C, 1+ Edema to shins on R Skin:  (-) Rash  LABS:  CBC Recent Labs     09/11/13  1615  WBC  5.9  HGB  8.2*  HCT  25.9*  PLT  215    Coag's Recent Labs     09/11/13  1615  APTT  31  INR  1.04    BMET Recent  Labs     09/11/13  1615  NA  140  K  3.9  CL  104  CO2  24  BUN  36*  CREATININE  4.30*  GLUCOSE  93    Electrolytes Recent Labs     09/11/13  1615  CALCIUM  8.7    Sepsis Markers No results found for this basename: LACTICACIDVEN, PROCALCITON, O2SATVEN,  in the last 72 hours  ABG No results found for this basename: PHART, PCO2ART, PO2ART,  in the last 72 hours  Liver Enzymes No results found for this basename: AST, ALT, ALKPHOS, BILITOT, ALBUMIN,  in the last 72 hours  Cardiac Enzymes No results found for this basename: TROPONINI, PROBNP,  in the last 72 hours  Glucose No results found for this basename: GLUCAP,  in the last 72 hours  Imaging No results found.  EKG: None performed CXR: None performed  ASSESSMENT / PLAN: Principal Problem:   Hypertensive emergency Active Problems:   Acute on chronic kidney disease, stage 4   Epistaxis   Acute kidney injury   Autoimmune disorder   Anemia   NICM (nonischemic cardiomyopathy)   PULMONARY A: 1. No acute issue.  CARDIOVASCULAR A:  1. Hypertensive Emergency: BP markedly elevated this evening despite patient claiming compliance with medications; he has admitted to medication non-compliance during several previous admissions 2.2 several barriers. I suspect this presentation is 2/2 noncompliance as well.   2. NICM: No acute evidence of heart failure.  P:   Check UDS  Restart home meds  Labetolol drip to lower MAP by 10% acutely and then by an additional 10-15% over next 24 hrs  RENAL A:  1. AKI on CKD: This is most likely 2/2 the hypertensive emergency. The underlying cause of his renal disease has not been determined. The DDx includes HTN neprhopathy vs. SLE. He was supposed to follow-up with [facility] for a possible biopsy but it has not happened.  P:    Serial BMPs  Consider renal consult in AM to expidite workup  GASTROINTESTINAL A: 1. No Acute Issue  HEMATOLOGIC A:    1. Epistaxis: This is likely 2/2 hypertension. Appreciate ENT assistance. 2. Anemia: Likely 2/2 AOCD + acute blood loss.  P:   Evaluation and management per ENT  Doxycline while nasal packing in (no Keflex given Amox allergy)  Serial CBC  Repeat Iron Panel   INFECTIOUS A: 1. No acute issue.  ENDOCRINE A:   1. No acute issue.  NEUROLOGIC A:  1. No acute issue.  BEST PRACTICE / DISPOSITION Level of Care:  ICU Consultants:  Renal in AM Code Status:  Full Diet:  Renal DVT Px:  SCDs  GI Px:  Not indicated Skin Integrity:  Intact  Social / Family:  Updated at bedside  TODAY'S SUMMARY:   I have personally obtained a history, examined the patient, evaluated laboratory and imaging results, formulated the assessment and plan and placed orders.  CRITICAL CARE: The patient is critically ill with multiple organ systems failure and requires high complexity decision making for assessment and support, frequent evaluation and titration of therapies, application of advanced monitoring technologies and extensive interpretation of multiple databases. Critical Care Time devoted to patient care services described in this note is 30 minutes.   Evalyn Casco, MD Pulmonary and Critical Care Medicine [facility] Pager: [phone]  09/12/2013, 12:07 AM

## 2013-09-11 NOTE — ED Notes (Signed)
EMS called for transport to [facility] ED per EDP.

## 2013-09-11 NOTE — H&P (Signed)
[facility] History and Physical  Matthew Beard ZOX:[id number] DOB: 1976/03/26 DOA: 09/11/2013  Referring physician:  PCP: No PCP Per Patient  Specialists:   Chief Complaint:   HPI: Matthew Beard is a 37 y.o. male     WAS ADMITTED TO THE CRITICAL CARE SERVICE SEE THE H+P DONE    Mechelle Pates C [facility] Pager [phone] If 7PM-7AM, please contact night-coverage [email or web address] Password [email or web address] 09/11/2013, 11:59 PM

## 2013-09-11 NOTE — ED Provider Notes (Signed)
CSN: [id number]     Arrival date & time 09/11/13  1538 History   First MD Initiated Contact with Patient 09/11/13 1550     Chief Complaint  Patient presents with  . Epistaxis   (Consider location/radiation/quality/duration/timing/severity/associated sxs/prior Treatment) HPI Comments: Patient here with about an 8 hour history of left nare bleeding.  He states that there was no trauma and he has been having recurrent bleeding for several weeks.  He states that this episode started this morning and he has been trying to stop the bleeding by holding pressure.  Reports now with blood clots coming from his mouth.  He is noted to be quite hypertensive and he states that he has taken his medication this morning.  He denies dizziness or syncope, shortness of breath, fever, chills, headache.  Patient is a 37 y.o. male presenting with nosebleeds. The history is provided by the patient. No language interpreter was used.  Epistaxis Location:  L nare Severity:  Severe Duration:  8 hours Timing:  Constant Progression:  Worsening Chronicity:  Recurrent Context: hypertension   Context: not anticoagulants, not aspirin use, not bleeding disorder, not elevation change, not foreign body, not nose picking and not trauma   Relieved by:  Applying pressure Worsened by:  Nothing tried Ineffective treatments:  None tried Associated symptoms: blood in oropharynx   Associated symptoms: no cough, no dizziness, no headaches and no sinus pain   Risk factors: no change in medication and no recent nasal surgery     Past Medical History  Diagnosis Date  . Hypertension   . CKD (chronic kidney disease), stage IV   . Normocytic anemia   . Hypertensive cardiomyopathy   . Pericardial effusion     Remote hx  . Cerebrovascular disease     Chronic lacunar infarcts   History reviewed. No pertinent past surgical history. Family History  Problem Relation Age of Onset  . Hypertension Mother   . Hypertension Father   .  Stroke Mother   . Stroke Father   . Heart disease Mother   . Heart disease Father   . Hypertension Brother    History  Substance Use Topics  . Smoking status: Never Smoker   . Smokeless tobacco: Not on file  . Alcohol Use: No    Review of Systems  HENT: Positive for nosebleeds.   Respiratory: Negative for cough.   Neurological: Negative for dizziness and headaches.  All other systems reviewed and are negative.    Allergies  Amoxicillin  Home Medications   Current Outpatient Rx  Name  Route  Sig  Dispense  Refill  . amLODipine (NORVASC) 10 MG tablet   Oral   Take 10 mg by mouth daily.         Marland Kitchen aspirin EC 325 MG EC tablet   Oral   Take 1 tablet (325 mg total) by mouth daily.   30 tablet   0   . carvedilol (COREG) 25 MG tablet   Oral   Take 1 tablet (25 mg total) by mouth 2 (two) times daily with a meal.   60 tablet   3     Increase in dose   . cloNIDine (CATAPRES) 0.2 MG tablet   Oral   Take 1 tablet (0.2 mg total) by mouth 3 (three) times daily.   90 tablet   3   . furosemide (LASIX) 80 MG tablet   Oral   Take 1 tablet (80 mg total) by mouth daily.   30  tablet   3   . hydrALAZINE (APRESOLINE) 100 MG tablet   Oral   Take 1 tablet (100 mg total) by mouth every 8 (eight) hours.   90 tablet   3   . isosorbide dinitrate (ISORDIL) 20 MG tablet   Oral   Take 1 tablet (20 mg total) by mouth 3 (three) times daily.   90 tablet   3    BP 199/131  Pulse 81  Temp(Src) 98.7 F (37.1 C) (Oral)  Ht 5\' 6"  (1.676 m)  Wt 170 lb (77.111 kg)  BMI 27.45 kg/m2  SpO2 99% Physical Exam  Nursing note and vitals reviewed. Constitutional: He is oriented to person, place, and time. He appears well-developed and well-nourished. No distress.  HENT:  Head: Normocephalic and atraumatic.  Right Ear: External ear normal.  Left Ear: External ear normal.  Mouth/Throat: Oropharynx is clear and moist. No oropharyngeal exudate.  Large clots in bilateral nares, no  noted focus to the bleeding anteriorly, no septal hematoma noted.  Eyes: Conjunctivae are normal. Pupils are equal, round, and reactive to light. No scleral icterus.  Neck: Normal range of motion. Neck supple.  Cardiovascular: Normal rate, regular rhythm and normal heart sounds.  Exam reveals no gallop and no friction rub.   No murmur heard. Pulmonary/Chest: Effort normal and breath sounds normal. No respiratory distress. He has no wheezes. He has no rales. He exhibits no tenderness.  Abdominal: Soft. Bowel sounds are normal. He exhibits no distension. There is no tenderness. There is no rebound and no guarding.  Musculoskeletal: Normal range of motion. He exhibits no edema and no tenderness.  Lymphadenopathy:    He has no cervical adenopathy.  Neurological: He is alert and oriented to person, place, and time. He exhibits normal muscle tone. Coordination normal.  Skin: Skin is warm and dry. No rash noted. No erythema. No pallor.  Psychiatric: He has a normal mood and affect. His behavior is normal. Judgment and thought content normal.    ED Course  EPISTAXIS MANAGEMENT Date/Time: 09/11/2013 5:40 PM Performed by: Marisue Humble, Catarino Vold C. Authorized by: Patrecia Pour Consent: Verbal consent obtained. written consent not obtained. Risks and benefits: risks, benefits and alternatives were discussed Consent given by: patient Patient understanding: patient states understanding of the procedure being performed Patient consent: the patient's understanding of the procedure does not match consent given Procedure consent: procedure consent does not match procedure scheduled Relevant documents: relevant documents not present or verified Test results: test results not available Site marked: the operative site was not marked Imaging studies: imaging studies not available Required items: required blood products, implants, devices, and special equipment available Patient identity confirmed: verbally  with patient and arm band Time out: Immediately prior to procedure a "time out" was called to verify the correct patient, procedure, equipment, support staff and site/side marked as required. Patient sedated: no Treatment site: left anterior and right anterior Repair method: anterior pack and merocel sponge Post-procedure assessment: bleeding decreased Treatment complexity: simple Recurrence: recurrence of recent bleed Patient tolerance: Patient tolerated the procedure well with no immediate complications. Comments: Initially placed Rhino rocket to left nare, patient watched while blood drawn, continued to bleed from right nare, so placed Merocel packing into nare.   (including critical care time) Labs Review Labs Reviewed  CBC WITH DIFFERENTIAL - Abnormal; Notable for the following:    RBC 3.05 (*)    Hemoglobin 8.2 (*)    HCT 25.9 (*)    RDW 15.7 (*)  All other components within normal limits  PROTIME-INR  APTT   Imaging Review No results found.  EKG Interpretation   None      Results for orders placed during the hospital encounter of 09/11/13  CBC WITH DIFFERENTIAL      Result Value Range   WBC 5.9  4.0 - 10.5 K/uL   RBC 3.05 (*) 4.22 - 5.81 MIL/uL   Hemoglobin 8.2 (*) 13.0 - 17.0 g/dL   HCT 40.9 (*) 81.1 - 91.4 %   MCV 84.9  78.0 - 100.0 fL   MCH 26.9  26.0 - 34.0 pg   MCHC 31.7  30.0 - 36.0 g/dL   RDW 78.2 (*) 95.6 - 21.3 %   Platelets 215  150 - 400 K/uL   Neutrophils Relative % 71  43 - 77 %   Neutro Abs 4.1  1.7 - 7.7 K/uL   Lymphocytes Relative 18  12 - 46 %   Lymphs Abs 1.1  0.7 - 4.0 K/uL   Monocytes Relative 9  3 - 12 %   Monocytes Absolute 0.5  0.1 - 1.0 K/uL   Eosinophils Relative 2  0 - 5 %   Eosinophils Absolute 0.1  0.0 - 0.7 K/uL   Basophils Relative 0  0 - 1 %   Basophils Absolute 0.0  0.0 - 0.1 K/uL  PROTIME-INR      Result Value Range   Prothrombin Time 13.4  11.6 - 15.2 seconds   INR 1.04  0.00 - 1.49  APTT      Result Value Range    aPTT 31  24 - 37 seconds   No results found.  5:44 PM Patient with continued bleeding, placed merocel packing, will continue to monitor, blood pressure continues to be elevated at 173/132, have given clonidine.  Plan will likely to be to admit the patient for hypertensive emergency and to follow Hgb, which has dropped since August.  Care will be transferred to R. Trenda Moots.  MDM  Epistaxis    Izola Price Marisue Humble, [place] 09/11/13 [id number]

## 2013-09-11 NOTE — Procedures (Signed)
Preop diagnosis: Epistaxis Postop diagnosis: same Procedure: Anterior and posterior left-sided nasal packing Surgeon: Jenne Pane Anesth: Topical with 1% lidocaine and neo-synephrine Compl: None Findings: An anterior source of bleeding could not be identified.  Clot was seen in the posterior left nasal passage.  There is a septal spur to the left. Description: After discussing risks, benefits, and alternatives, the left nasal passage was suctioned of blood and clot while using a headlight.  The left nasal passage was sprayed with the above topical medications.  A 10 cm Merocel pack was passed into the left nasal passage until fully in the proper position.  He tolerated placement well.  A drip pad was added.  He was then returned to nursing care in stable condition.

## 2013-09-11 NOTE — ED Notes (Signed)
Pt arrived ems w c/o nose bleed onset 0700 this am denies any pain,  bp elevated

## 2013-09-11 NOTE — ED Provider Notes (Signed)
I saw and evaluated the patient, reviewed the resident's note and I agree with the findings and plan. If applicable, I agree with the resident's interpretation of the EKG.  If applicable, I was present for critical portions of any procedures performed.  Persistent epistaxis, transferred from [facility] with hypertension and CKD. Admission already accepted. To see Dr. Jenne Pane. Bleeding controlled on arrival.  Hypertensive.  Dr. Jenne Pane consulted from [facility]. BP 194/115  Pulse 99  Temp(Src) 97.7 F (36.5 C) (Axillary)  Resp 21  Ht 5\' 6"  (1.676 m)  Wt 160 lb 0.9 oz (72.6 kg)  BMI 25.85 kg/m2  SpO2 100%   Glynn Octave, MD 09/11/13 2340

## 2013-09-11 NOTE — Consult Note (Signed)
Reason for Consult:epistaxis Referring Physician: ER  Matthew Beard is an 37 y.o. male.  HPI: 37 year old with hypertension and chronic kidney disease who started bleeding from the left side of his nose at about 7am.  Evidently, though, according to the medical record, he has had intermittent left-sided bleeding for the past two weeks.  He was seen at urgent care two days ago with bleeding and exhibited neurologic symptoms concerning for stroke (he evidently had a recent stroke) but left AMA prior to admission and further evaluation.  Today, he says that he tried to stop bleeding with pressure and tissues but without success.  Bleeding has been severe and persistent.  He presented to [facility] where a posterior source was determined and several attempts were made at packing the nose without controlling bleeding.  He has also been found to have severely elevated blood pressure that did not respond to initial medical management at the ER.  He is transferred to [facility] for hypertension management and control of his nose bleeding.  Past Medical History  Diagnosis Date  . Hypertension   . CKD (chronic kidney disease), stage IV   . Normocytic anemia   . Hypertensive cardiomyopathy   . Pericardial effusion     Remote hx  . Cerebrovascular disease     Chronic lacunar infarcts    History reviewed. No pertinent past surgical history.  Family History  Problem Relation Age of Onset  . Hypertension Mother   . Hypertension Father   . Stroke Mother   . Stroke Father   . Heart disease Mother   . Heart disease Father   . Hypertension Brother     Social History:  reports that he has never smoked. He does not have any smokeless tobacco history on file. He reports that he does not drink alcohol or use illicit drugs.  Allergies:  Allergies  Allergen Reactions  . Amoxicillin Hives    Medications: I have reviewed the patient's current medications.  Results for orders placed during  the hospital encounter of 09/11/13 (from the past 48 hour(s))  CBC WITH DIFFERENTIAL     Status: Abnormal   Collection Time    09/11/13  4:15 PM      Result Value Range   WBC 5.9  4.0 - 10.5 K/uL   RBC 3.05 (*) 4.22 - 5.81 MIL/uL   Hemoglobin 8.2 (*) 13.0 - 17.0 g/dL   HCT 56.2 (*) 13.0 - 86.5 %   MCV 84.9  78.0 - 100.0 fL   MCH 26.9  26.0 - 34.0 pg   MCHC 31.7  30.0 - 36.0 g/dL   RDW 78.4 (*) 69.6 - 29.5 %   Platelets 215  150 - 400 K/uL   Neutrophils Relative % 71  43 - 77 %   Neutro Abs 4.1  1.7 - 7.7 K/uL   Lymphocytes Relative 18  12 - 46 %   Lymphs Abs 1.1  0.7 - 4.0 K/uL   Monocytes Relative 9  3 - 12 %   Monocytes Absolute 0.5  0.1 - 1.0 K/uL   Eosinophils Relative 2  0 - 5 %   Eosinophils Absolute 0.1  0.0 - 0.7 K/uL   Basophils Relative 0  0 - 1 %   Basophils Absolute 0.0  0.0 - 0.1 K/uL  PROTIME-INR     Status: None   Collection Time    09/11/13  4:15 PM      Result Value Range  Prothrombin Time 13.4  11.6 - 15.2 seconds   INR 1.04  0.00 - 1.49  APTT     Status: None   Collection Time    09/11/13  4:15 PM      Result Value Range   aPTT 31  24 - 37 seconds  BASIC METABOLIC PANEL     Status: Abnormal   Collection Time    09/11/13  4:15 PM      Result Value Range   Sodium 140  135 - 145 mEq/L   Potassium 3.9  3.5 - 5.1 mEq/L   Chloride 104  96 - 112 mEq/L   CO2 24  19 - 32 mEq/L   Glucose, Bld 93  70 - 99 mg/dL   BUN 36 (*) 6 - 23 mg/dL   Creatinine, Ser 1.61 (*) 0.50 - 1.35 mg/dL   Calcium 8.7  8.4 - 09.6 mg/dL   GFR calc non Af Amer 16 (*) >90 mL/min   GFR calc Af Amer 19 (*) >90 mL/min   Comment: (NOTE)     The eGFR has been calculated using the CKD EPI equation.     This calculation has not been validated in all clinical situations.     eGFR's persistently <90 mL/min signify possible Chronic Kidney     Disease.    No results found.  Review of Systems  HENT: Positive for nosebleeds.   All other systems reviewed and are negative.   Blood  pressure 194/115, pulse 85, temperature 98.7 F (37.1 C), temperature source Oral, resp. rate 21, height 5\' 6"  (1.676 m), weight 77.111 kg (170 lb), SpO2 100.00%. Physical Exam  Constitutional: He is oriented to person, place, and time. He appears well-developed and well-nourished. No distress.  HENT:  Head: Normocephalic and atraumatic.  Right Ear: External ear normal.  Left Ear: External ear normal.  Mouth/Throat: Oropharynx is clear and moist.  Partially placed nasal packs in both nasal passages, saturated with blood.  No active bleeding.  Eyes: Conjunctivae and EOM are normal. Pupils are equal, round, and reactive to light.  Neck: Normal range of motion. Neck supple.  Cardiovascular: Normal rate.   Respiratory: Effort normal.  GI:  Did not examine.  Genitourinary:  Did not examine.  Musculoskeletal: Normal range of motion.  Neurological: He is alert and oriented to person, place, and time. No cranial nerve deficit.  Skin: Skin is warm and dry.  Psychiatric: He has a normal mood and affect. His behavior is normal. Judgment and thought content normal.    Assessment/Plan: Epistaxis, hypertension At the bedside, the packs were removed and a new 10 cm Merocel pack was placed in the left side.  See the procedure note.  Even with the pack well positioned, he is still dripping through the pack a bit.  A drip pad can be changed as needed.  Bleeding will not be completely controllable until his hypertension is aggressively brought into a more normal range.  Will follow along.  I recommend treating with Keflex while the pack is in place.  As long as it is working, I will leave the pack in place 3-5 days.  Matthew Beard 09/11/2013, 9:10 PM

## 2013-09-11 NOTE — ED Provider Notes (Signed)
Care assumed from [place], [place] at shift change. Patient with epistaxis, still bleeding in ED despite rhino rocket and Merocel packing. Patient also hypertensive at 199/131, states compliance. Clonidine given. Plan to see if bleeding stops, if not, ENT consult. If it stops, plan to admit. 7:52 PM Nose continues to bleed despite placement of 10 cm Merocel in both nares as advised per ENT Dr. Jenne Pane. Blood pressure remains elevated, IV hydralazine given. Began to drop, 170/120 noted, however after replacement of Merocel and both nares, increased to 199/123. Patient has been admitted, admission accepted by Dr. Catha Gosselin, [facility] to step down unit, however Dr. Jenne Pane requests patient be transferred through the ED first in order for him to try to stop the posterior bleed. ED physician Dr. Jodi Mourning aware patient will be arriving. Case discussed with attending Dr. Anitra Lauth who agrees with plan of care.  Trevor Mace, PA-C 09/11/13 1954

## 2013-09-11 NOTE — ED Provider Notes (Signed)
BP 194/115  Pulse 85  Temp(Src) 98.7 F (37.1 C) (Oral)  Resp 21  Ht 5\' 6"  (1.676 m)  Wt 170 lb (77.111 kg)  BMI 27.45 kg/m2  SpO2 100%  Pt here in ED for ENT procedure prior to admission to hospital. Pt in stable condition. ENT placed nasal packing in ED. No acute events during ED stay.   Charm Barges, MD 09/11/13 2231

## 2013-09-12 DIAGNOSIS — N184 Chronic kidney disease, stage 4 (severe): Secondary | ICD-10-CM

## 2013-09-12 DIAGNOSIS — N179 Acute kidney failure, unspecified: Principal | ICD-10-CM

## 2013-09-12 LAB — CBC
HCT: 20.6 % — ABNORMAL LOW (ref 39.0–52.0)
HCT: 20.9 % — ABNORMAL LOW (ref 39.0–52.0)
Hemoglobin: 6.7 g/dL — CL (ref 13.0–17.0)
Hemoglobin: 6.9 g/dL — CL (ref 13.0–17.0)
MCH: 27 pg (ref 26.0–34.0)
MCH: 27.7 pg (ref 26.0–34.0)
MCHC: 33 g/dL (ref 30.0–36.0)
MCV: 83.1 fL (ref 78.0–100.0)
MCV: 83.9 fL (ref 78.0–100.0)
Platelets: 182 10*3/uL (ref 150–400)
Platelets: 171 10*3/uL (ref 150–400)
RBC: 2.49 MIL/uL — ABNORMAL LOW (ref 4.22–5.81)
RDW: 16.1 % — ABNORMAL HIGH (ref 11.5–15.5)
WBC: 7.2 10*3/uL (ref 4.0–10.5)

## 2013-09-12 LAB — BASIC METABOLIC PANEL
CO2: 22 mEq/L (ref 19–32)
Calcium: 8.3 mg/dL — ABNORMAL LOW (ref 8.4–10.5)
Chloride: 106 mEq/L (ref 96–112)
Creatinine, Ser: 3.92 mg/dL — ABNORMAL HIGH (ref 0.50–1.35)
GFR calc non Af Amer: 18 mL/min — ABNORMAL LOW (ref >90)
Glucose, Bld: 85 mg/dL (ref 70–99)

## 2013-09-12 LAB — IRON AND TIBC
Iron: 38 ug/dL — ABNORMAL LOW (ref 42–135)
Saturation Ratios: 16 % — ABNORMAL LOW (ref 20–55)
TIBC: 235 ug/dL (ref 215–435)

## 2013-09-12 LAB — ABO/RH: ABO/RH(D): A POS

## 2013-09-12 LAB — FERRITIN: Ferritin: 105 ng/mL (ref 22–322)

## 2013-09-12 LAB — PREPARE RBC (CROSSMATCH)

## 2013-09-12 MED ORDER — SODIUM CHLORIDE 0.9 % IV SOLN: 250.0000 mL | INTRAVENOUS | Status: DC | PRN

## 2013-09-12 NOTE — Progress Notes (Signed)
  Subjective: No active nose bleeding.  Hypertension and anemia management noted.  No complaints from him today.  Objective: Vital signs in last 24 hours: Temp:  [97 F (36.1 C)-98.7 F (37.1 C)] 97.7 F (36.5 C) (10/25 0905) Pulse Rate:  [8-99] 78 (10/25 0930) Resp:  [12-32] 18 (10/25 0930) BP: (127-208)/(83-132) 157/107 mmHg (10/25 0930) SpO2:  [93 %-100 %] 99 % (10/25 0930) Weight:  [72.6 kg (160 lb 0.9 oz)-77.111 kg (170 lb)] 72.6 kg (160 lb 0.9 oz) (10/24 2222) Last BM Date: 09/11/13  Intake/Output from previous day: 10/24 0701 - 10/25 0700 In: 238.4 [I.V.:238.4] Out: 400 [Urine:400] Intake/Output this shift: Total I/O In: 207.5 [I.V.:42.5; Blood:165] Out: -   General appearance: alert, cooperative and no distress Nose: Drip pad removed.  Packing in place in the left nasal passage.  No active bleeding.  Lab Results:   Recent Labs  09/11/13 1615 09/12/13 0415  WBC 5.9 7.2  HGB 8.2* 6.7*  HCT 25.9* 20.6*  PLT 215 182   BMET  Recent Labs  09/11/13 1615 09/12/13 0415  NA 140 140  K 3.9 3.8  CL 104 106  CO2 24 22  GLUCOSE 93 85  BUN 36* 39*  CREATININE 4.30* 3.92*  CALCIUM 8.7 8.3*   PT/INR  Recent Labs  09/11/13 1615  LABPROT 13.4  INR 1.04   ABG No results found for this basename: PHART, PCO2, PO2, HCO3,  in the last 72 hours  Studies/Results: No results found.  Anti-infectives: Anti-infectives   Start     Dose/Rate Route Frequency Ordered Stop   09/11/13 2345  doxycycline (VIBRA-TABS) tablet 100 mg     100 mg Oral Every 12 hours 09/11/13 2336        Assessment/Plan: s/p Left anterior/posterior nasal packing for posterior epistaxis related to hypertension Plan to leave pack in place until about Tuesday.  If still here, I will remove it here.  If discharged by that time, he will need a follow-up appointment with me for pack removal.  Continue doxycycline.  LOS: 1 day    Anela Bensman 09/12/2013

## 2013-09-12 NOTE — Progress Notes (Signed)
eLink Physician-Brief Progress Note Patient Name: Matthew Beard DOB: 03-29-1976 MRN: [id number]  Date of Service  09/12/2013   HPI/Events of Note  S/P epistaxis with packing.  Now with hgb drop from 8.2 to 6.7.  Remains HD stable.   eICU Interventions  Plan: Transfuse 1 unit of pRBC  Post-transfusion CBC   Intervention Category Intermediate Interventions: Bleeding - evaluation and treatment with blood products  Lalani Winkles 09/12/2013, 6:13 AM

## 2013-09-12 NOTE — Progress Notes (Signed)
PULMONARY  / CRITICAL CARE MEDICINE  Name: PRUDENCIO VELAZCO MRN: [id number] DOB: [birth year]    ADMISSION DATE:  09/11/2013  REFERRING MD :  Glynn Octave  CHIEF COMPLAINT:  Nose bleed  BRIEF PATIENT DESCRIPTION:  37 yo male with epistaxis and HTN urgency.  Hx of CKD Stage IV with positive ANA, systolic heart failure (EF 20% on Echo 06/28/13).  PCCM asked to admit to ICU.  SIGNIFICANT EVENTS: 10/24 Admit to ICU, ENT consulted  STUDIES:   LINES / TUBES: PIV  CULTURES: None  ANTIBIOTICS: Doxycycline 10/25 >>   SUBJECTIVE:   VITAL SIGNS: Temp:  [97.2 F (36.2 C)-98.7 F (37.1 C)] 97.2 F (36.2 C) (10/25 0746) Pulse Rate:  [8-99] 71 (10/25 0715) Resp:  [12-32] 32 (10/25 0715) BP: (127-208)/(83-132) 137/91 mmHg (10/25 0715) SpO2:  [93 %-100 %] 100 % (10/25 0715) Weight:  [160 lb 0.9 oz (72.6 kg)-170 lb (77.111 kg)] 160 lb 0.9 oz (72.6 kg) (10/24 2222) Room air  INTAKE / OUTPUT: Intake/Output     10/24 0701 - 10/25 0700 10/25 0701 - 10/26 0700   I.V. (mL/kg) 238.4 (3.3)    Total Intake(mL/kg) 238.4 (3.3)    Urine (mL/kg/hr) 400    Total Output 400     Net -161.6            PHYSICAL EXAMINATION: General:  No distress Neuro:  Alert, normal strength, follows commands HEENT:  Nasal packing in place Cardiovascular:  Regular, no murmur Lungs:  No wheeze Abdomen:  Soft, non tender Musculoskeletal:  No edema Skin:  No rashes  LABS:  CBC Recent Labs     09/11/13  1615  09/12/13  0415  WBC  5.9  7.2  HGB  8.2*  6.7*  HCT  25.9*  20.6*  PLT  215  182   Coag's Recent Labs     09/11/13  1615  APTT  31  INR  1.04   BMET Recent Labs     09/11/13  1615  09/12/13  0415  NA  140  140  K  3.9  3.8  CL  104  106  CO2  24  22  BUN  36*  39*  CREATININE  4.30*  3.92*  GLUCOSE  93  85   Electrolytes Recent Labs     09/11/13  1615  09/12/13  0415  CALCIUM  8.7  8.3*    ASSESSMENT / PLAN:  A: Epistaxis. P: -per ENT -Day 1 of  doxycycline  A: HTN urgency >> ? His compliance with medications. Hx of chronic systolic heart failure.  Followed by Dr. Tobias Alexander as outpt. P: -wean off labetalol gtt to keep SBP < 170 -continue norvasc, coreg, catapres, lasix, hydralazine, isordil -f/u UDS  A: Stage IV CKD with positive ANA from 07/03/13.  Followed by Dr. Elvis Coil as outpt. P: -monitor renal fx, urine outpt, electrolytes -defer renal evaluation for now  A: Anemia >> likely from epistaxis. P: -transfuse 1 unit PRBC 10/25 -f/u CBC -SCD for DVT prevention -f/u iron levels  A: Nutrition. P: -advance diet as tolerated  Summary: Keep in ICU while on labetalol gtt >> once transition off, then can transfer to SDU.  Coralyn Helling, MD [facility] Pulmonary/Critical Care 09/12/2013, 8:11 AM Pager:  [phone] After 3pm call: [phone]

## 2013-09-12 NOTE — Progress Notes (Signed)
CCM made aware of repeat HGB value post 1 PRBC transfusion (6.9). No new orders received. Pt is hemodynamically stable, no chest pain/SOB, and no longer bleeding.   Will monitor  Matthew Beard

## 2013-09-12 NOTE — Progress Notes (Signed)
MD Sood made aware of pt's low UOP- no new orders at this time, will monitor  Carey Bullocks, Jorge Ny

## 2013-09-12 NOTE — Progress Notes (Addendum)
Received patient to room 2C11, hypertensive, per transferring RN, no medications were given in ED to address BP. Unable to obtain MRSA swab of nasal passages due to packing. CHG bath complete. Dr. Lovell Sheehan at bedside, orders received and initiated. Patient denies, headache or chest pain. Norton Pastel, RN

## 2013-09-12 NOTE — Progress Notes (Signed)
CRITICAL VALUE ALERT  Critical value received:  Hgb 6.7  Date of notification:  09/12/13  Time of notification:  0603  Critical value read back: yes   Nurse who received alert:  A. Lamar Benes RN  MD notified (1st page):  Dr. Darrick Penna  Time of first page:  0604  MD notified (2nd page):  Time of second page:  Responding MD:  Dr. Darrick Penna   Time MD responded:  Placed orders in Epic at [phone]

## 2013-09-12 NOTE — ED Provider Notes (Signed)
Medical screening examination/treatment/procedure(s) were conducted as a shared visit with non-physician practitioner(s) and myself.  I personally evaluated the patient during the encounter.  EKG Interpretation     Ventricular Rate:    PR Interval:    QRS Duration:   QT Interval:    QTC Calculation:   R Axis:     Text Interpretation:              Pt presents with HTN despite taking meds worsening renal function and posterior nosebleed.  Pt has been bleeding intermittently for several days.  Despite packing still bleeding.  Attempted to remove packing and place merocell without improvement.  Pt also with acute blood loss anemia dueto nose bleed.  No anticoagulation at present.  Due to severe hypertension pt given clonidine and hydralazine with some improvement.  Pt had frequent checks for hemostasis and hyptertension control.  CRITICAL CARE Performed by: Gwyneth Sprout Total critical care time: 45 Critical care time was exclusive of separately billable procedures and treating other patients. Critical care was necessary to treat or prevent imminent or life-threatening deterioration. Critical care was time spent personally by me on the following activities: development of treatment plan with patient and/or surrogate as well as nursing, discussions with consultants, evaluation of patient's response to treatment, examination of patient, obtaining history from patient or surrogate, ordering and performing treatments and interventions, ordering and review of laboratory studies, ordering and review of radiographic studies, pulse oximetry and re-evaluation of patient's condition.   Gwyneth Sprout, MD 09/12/13 [phone]

## 2013-09-12 NOTE — Progress Notes (Signed)
Spoke with Lyman Bishop in the lab at [phone] and let him know that I needed a stat Type and Cross drawn on Matthew Beard. It is now, 0657 and no one from lab has shown up. Spoke with Lyman Bishop for a second time. He said that everyone was busy with morning collection. Again, I stressed the importance that the patient has a critically low hemoglobin and I need to give him blood. He said someone would be "right up." A. Haggard RN

## 2013-09-12 NOTE — ED Provider Notes (Signed)
Medical screening examination/treatment/procedure(s) were performed by non-physician practitioner and as supervising physician I was immediately available for consultation/collaboration.  EKG Interpretation   None         Laray Anger, DO 09/12/13 1238

## 2013-09-12 NOTE — ED Provider Notes (Signed)
Medical screening examination/treatment/procedure(s) were conducted as a shared visit with non-physician practitioner(s) and myself.  I personally evaluated the patient during the encounter.  EKG Interpretation     Ventricular Rate:    PR Interval:    QRS Duration:   QT Interval:    QTC Calculation:   R Axis:     Text Interpretation:                Gwyneth Sprout, MD 09/12/13 [phone]

## 2013-09-12 NOTE — Progress Notes (Signed)
Unable to perform MRSA PCR due to nasal packing. Thresa Ross RN

## 2013-09-12 NOTE — Progress Notes (Signed)
Spoke with Dr. Darrick Penna about PO meds that are scheduled for 2345 (Hydralizine, Catapres, Doxycycline, and Isordil.) Will hold these medications at this time. On Labetalol drip currently. BP is beginning to respond to drip. Thresa Ross RN

## 2013-09-13 LAB — RENAL FUNCTION PANEL
CO2: 22 mEq/L (ref 19–32)
Calcium: 8.2 mg/dL — ABNORMAL LOW (ref 8.4–10.5)
Chloride: 108 mEq/L (ref 96–112)
Creatinine, Ser: 4.11 mg/dL — ABNORMAL HIGH (ref 0.50–1.35)
GFR calc Af Amer: 20 mL/min — ABNORMAL LOW (ref >90)
GFR calc non Af Amer: 17 mL/min — ABNORMAL LOW (ref >90)
Glucose, Bld: 94 mg/dL (ref 70–99)
Phosphorus: 3.2 mg/dL (ref 2.3–4.6)
Potassium: 3.5 mEq/L (ref 3.5–5.1)
Sodium: 140 mEq/L (ref 135–145)

## 2013-09-13 LAB — CBC
HCT: 20.5 % — ABNORMAL LOW (ref 39.0–52.0)
MCHC: 33.2 g/dL (ref 30.0–36.0)
MCV: 83 fL (ref 78.0–100.0)
RBC: 2.47 MIL/uL — ABNORMAL LOW (ref 4.22–5.81)
RDW: 16.1 % — ABNORMAL HIGH (ref 11.5–15.5)
WBC: 7 10*3/uL (ref 4.0–10.5)

## 2013-09-13 MED ORDER — POLYSACCHARIDE IRON COMPLEX 150 MG PO CAPS
150.0000 mg | ORAL_CAPSULE | Freq: Every day | ORAL | Status: DC
  Administered 2013-09-13 – 2013-09-15 (×3): 150 mg via ORAL
  Filled 2013-09-13 (×3): qty 1

## 2013-09-13 NOTE — Progress Notes (Signed)
PULMONARY  / CRITICAL CARE MEDICINE  Name: Matthew Beard MRN: [id number] DOB: 16-Aug-1976    ADMISSION DATE:  09/11/2013  REFERRING MD :  Glynn Octave  CHIEF COMPLAINT:  Nose bleed  BRIEF PATIENT DESCRIPTION:  37 yo male with epistaxis and HTN urgency.  Hx of CKD Stage IV with positive ANA, systolic heart failure (EF 20% on Echo 06/28/13).  PCCM asked to admit to ICU.  SIGNIFICANT EVENTS: 10/24 Admit to ICU, ENT consulted 10/25 Off labetalol gtt 10/26 Transfer to medical floor bed  STUDIES:   LINES / TUBES: PIV  CULTURES: None  ANTIBIOTICS: Doxycycline 10/25 >>   SUBJECTIVE:  Feels okay. Denies chest pain or shortness of breath.  Still has cough, but reduced.  VITAL SIGNS: Temp:  [97 F (36.1 C)-99 F (37.2 C)] 98.1 F (36.7 C) (10/26 0750) Pulse Rate:  [56-78] 72 (10/26 0700) Resp:  [9-20] 16 (10/26 0700) BP: (103-157)/(60-107) 128/66 mmHg (10/26 0700) SpO2:  [98 %-100 %] 99 % (10/26 0700) Room air  INTAKE / OUTPUT: Intake/Output     10/25 0701 - 10/26 0700 10/26 0701 - 10/27 0700   P.O. 600    I.V. (mL/kg) 282.5 (3.9)    Blood 315    Total Intake(mL/kg) 1197.5 (16.5)    Urine (mL/kg/hr) 1600 (0.9)    Total Output 1600     Net -402.5            PHYSICAL EXAMINATION: General:  No distress Neuro:  Alert, normal strength, follows commands HEENT:  Nasal packing in place Cardiovascular:  Regular, no murmur Lungs:  No wheeze Abdomen:  Soft, non tender Musculoskeletal:  No edema Skin:  No rashes  LABS:  CBC Recent Labs     09/12/13  0415  09/12/13  1427  09/13/13  0425  WBC  7.2  8.8  7.0  HGB  6.7*  6.9*  6.8*  HCT  20.6*  20.9*  20.5*  PLT  182  171  165   Coag's Recent Labs     09/11/13  1615  APTT  31  INR  1.04   BMET Recent Labs     09/11/13  1615  09/12/13  0415  09/13/13  0425  NA  140  140  140  K  3.9  3.8  3.5  CL  104  106  108  CO2  24  22  22   BUN  36*  39*  38*  CREATININE  4.30*  3.92*  4.11*  GLUCOSE  93   85  94   Electrolytes Recent Labs     09/11/13  1615  09/12/13  0415  09/13/13  0425  CALCIUM  8.7  8.3*  8.2*  PHOS   --    --   3.2   Iron/TIBC/Ferritin    Component Value Date/Time   IRON 38* 09/12/2013 0415   TIBC 235 09/12/2013 0415   FERRITIN 105 09/12/2013 0415    ASSESSMENT / PLAN:  A: Epistaxis. P: -per ENT >> tentative plan to have nasal packing d/c'ed 10/28 -Day 2 of doxycycline  A: HTN urgency >> ? His compliance with medications >> improved. Hx of chronic systolic heart failure.  Followed by Dr. Tobias Alexander as outpt. P: -continue norvasc, coreg, catapres, lasix, hydralazine, isordil -f/u UDS  A: Stage IV CKD with positive ANA from 07/03/13.  Followed by Dr. Elvis Coil as outpt. P: -monitor renal fx, urine outpt, electrolytes -defer renal evaluation for now  A: Anemia >> likely  from epistaxis, and iron deficiency. -transfused 1 unit PRBC 10/25. P: -f/u CBC -SCD for DVT prevention -add niferex 10/26  A: Nutrition. P: -Renal diet  Summary: Transfer to non tele medical floor bed.  Transfer to [facility] for 10/27 and PCCM sign off.  Coralyn Helling, MD [facility] Pulmonary/Critical Care 09/13/2013, 7:58 AM Pager:  [phone] After 3pm call: [phone]

## 2013-09-13 NOTE — Progress Notes (Signed)
Pt t/x to 5W13 in wheelchair without event. RN Victorino Dike aware of arrival to floor and NT [name] in room for pt arrival. Per pt request, I notified his fiance and bother of his new room number. Pt had his yellow colored watch with him upon t/x.  Matthew Beard

## 2013-09-14 ENCOUNTER — Other Ambulatory Visit (HOSPITAL_COMMUNITY): Payer: Self-pay

## 2013-09-14 DIAGNOSIS — I428 Other cardiomyopathies: Secondary | ICD-10-CM

## 2013-09-14 DIAGNOSIS — D649 Anemia, unspecified: Secondary | ICD-10-CM

## 2013-09-14 DIAGNOSIS — R040 Epistaxis: Secondary | ICD-10-CM

## 2013-09-14 DIAGNOSIS — I10 Essential (primary) hypertension: Secondary | ICD-10-CM

## 2013-09-14 LAB — CBC
HCT: 20.4 % — ABNORMAL LOW (ref 39.0–52.0)
HCT: 21.9 % — ABNORMAL LOW (ref 39.0–52.0)
Hemoglobin: 6.7 g/dL — CL (ref 13.0–17.0)
Hemoglobin: 7.3 g/dL — ABNORMAL LOW (ref 13.0–17.0)
MCHC: 32.8 g/dL (ref 30.0–36.0)
Platelets: 215 10*3/uL (ref 150–400)
RBC: 2.43 MIL/uL — ABNORMAL LOW (ref 4.22–5.81)
RBC: 2.6 MIL/uL — ABNORMAL LOW (ref 4.22–5.81)
RDW: 16.3 % — ABNORMAL HIGH (ref 11.5–15.5)
WBC: 7.7 10*3/uL (ref 4.0–10.5)
WBC: 7.5 10*3/uL (ref 4.0–10.5)

## 2013-09-14 LAB — PREPARE RBC (CROSSMATCH)

## 2013-09-14 LAB — BASIC METABOLIC PANEL
Chloride: 104 mEq/L (ref 96–112)
GFR calc Af Amer: 20 mL/min — ABNORMAL LOW (ref >90)
GFR calc non Af Amer: 17 mL/min — ABNORMAL LOW (ref >90)
Potassium: 3.4 mEq/L — ABNORMAL LOW (ref 3.5–5.1)
Sodium: 137 mEq/L (ref 135–145)

## 2013-09-14 MED ORDER — WHITE PETROLATUM GEL
Status: AC
  Filled 2013-09-14: qty 5

## 2013-09-14 MED ORDER — ACETAMINOPHEN 325 MG PO TABS
650.0000 mg | ORAL_TABLET | Freq: Once | ORAL | Status: AC
  Administered 2013-09-14: 650 mg via ORAL
  Filled 2013-09-14: qty 2

## 2013-09-14 MED ORDER — DIPHENHYDRAMINE HCL 50 MG/ML IJ SOLN
25.0000 mg | Freq: Once | INTRAMUSCULAR | Status: AC
  Administered 2013-09-14: 25 mg via INTRAVENOUS
  Filled 2013-09-14: qty 1

## 2013-09-14 MED ORDER — FUROSEMIDE 10 MG/ML IJ SOLN
20.0000 mg | Freq: Once | INTRAMUSCULAR | Status: AC
  Administered 2013-09-14: 14:00:00 20 mg via INTRAVENOUS
  Filled 2013-09-14: qty 2

## 2013-09-14 NOTE — Progress Notes (Signed)
Visitor at bedside. Re-taped packing. Tolerates regular diet.

## 2013-09-14 NOTE — Progress Notes (Signed)
UR completed. Jadavion Spoelstra RN CCM Case Mgmt 

## 2013-09-14 NOTE — Progress Notes (Signed)
[facility] PROGRESS NOTE  KENJI MAPEL ZOX:[id number] DOB: 1976-08-24 DOA: 09/11/2013 PCP: No PCP Per Patient  Assessment/Plan: 1. Epistaxis  - per ENT, packing to be removed on 10/28.  - day 3 of doxycycline (while packing is in place per Critical Care)  - secondary to uncontrolled HTN  2. HTN urgency   - likely due to non compliance and financial issues with medications.  - weaned off labetalol 10/25. BP stable.  - continue norvasc, coreg, catapres, lasix, hydralazine, isordil  - spoke with case management about providing resources for primary care.   3. Stage IV CKD with positive ANA from 07/03/13.  - Followed by Dr. Elvis Coil as outpatient  - continue to monitor renal function, urine output, electrolytes  - defer renal evaluation for now-creatinine very close to usual baseline  4. Chronic Systolic CHF  - Last ejection fraction around 20%-per echocardiogram on 06/28/13.  - Currently clinically compensated  - Follows up with [facility] as an outpatient  4. Anemia- acute on chronic  - Acute anemia likely secondary to blood loss from epistaxis, chronic anemia likely secondary to stage IV chronic kidney disease  - transfused 1 unit PRBC today 10/25. Hbg 6.7 today, will transfuse one more unit today.   - oral iron supplementation  - follow up CBC  - SCDs for DVT prevention  5. Nutrition  - renal diet  Code Status: FULL  Disposition Plan: Monitor today. Reassess CBC, and renal function in morning. If stable, plan on discharge tomorrow 10/28, after packing is removed   Consultants:  Surgery, Dr. Christia Reading, 09/11/13  Procedures: Anterior and posterior left-sided nasal packing, Dr. Christia Reading, 09/11/13  Antibiotics:  Doxycycline, Day 3, Started: 09/12/13. D/C when packing removed per Critical Care.  HPI/Subjective: Patient feels well today. Is concerned with post nasal drip when supine. Denies syncope, being light headed, or dizzy. Denies blood  from nose.  Objective: Filed Vitals:   09/14/13 0931  BP:   Pulse: 66  Temp:   Resp:     Intake/Output Summary (Last 24 hours) at 09/14/13 0942 Last data filed at 09/14/13 8119  Gross per 24 hour  Intake  412.5 ml  Output      0 ml  Net  412.5 ml   Filed Weights   09/11/13 2222 09/13/13 1025 09/14/13 0500  Weight: 160 lb 0.9 oz (72.6 kg) 167 lb 5.3 oz (75.9 kg) 159 lb 4.8 oz (72.258 kg)    Exam:   General:  Well appearing african male sitting up on side of bed eating breakfast. Pleasent  Cardiovascular: Regular rate and rhythm, no murmur, gallops or rubs  Respiratory: CTAB  Abdomen: non tender to palpation, +BS  Musculoskeletal: extremities normal, no swelling, no loss of sensation, full range of motion in all four.  Data Reviewed: Basic Metabolic Panel:  Recent Labs Lab 09/11/13 1615 09/12/13 0415 09/13/13 0425 09/14/13 0515  NA 140 140 140 137  K 3.9 3.8 3.5 3.4*  CL 104 106 108 104  CO2 24 22 22 22   GLUCOSE 93 85 94 115*  BUN 36* 39* 38* 38*  CREATININE 4.30* 3.92* 4.11* 4.14*  CALCIUM 8.7 8.3* 8.2* 8.4  PHOS  --   --  3.2  --    Liver Function Tests:  Recent Labs Lab 09/13/13 0425  ALBUMIN 2.5*   No results found for this basename: LIPASE, AMYLASE,  in the last 168 hours No results found for this basename: AMMONIA,  in the last 168 hours  CBC:  Recent Labs Lab 09/11/13 1615 09/12/13 0415 09/12/13 1427 09/13/13 0425 09/14/13 0515  WBC 5.9 7.2 8.8 7.0 7.7  NEUTROABS 4.1  --   --   --   --   HGB 8.2* 6.7* 6.9* 6.8* 6.7*  HCT 25.9* 20.6* 20.9* 20.5* 20.4*  MCV 84.9 83.1 83.9 83.0 84.0  PLT 215 182 171 165 215   Cardiac Enzymes: No results found for this basename: CKTOTAL, CKMB, CKMBINDEX, TROPONINI,  in the last 168 hours BNP (last 3 results)  Recent Labs  06/28/13 1803 06/29/13 0345 07/04/13 0640  PROBNP 42206.0* 31751.0* 6732.0*   CBG: No results found for this basename: GLUCAP,  in the last 168 hours  No results found for  this or any previous visit (from the past 240 hour(s)).   Studies: No results found.  Scheduled Meds: . amLODipine  10 mg Oral Daily  . carvedilol  25 mg Oral BID WC  . cloNIDine  0.2 mg Oral TID  . doxycycline  100 mg Oral Q12H  . furosemide  20 mg Intravenous Once  . furosemide  80 mg Oral Daily  . hydrALAZINE  100 mg Oral Q8H  . iron polysaccharides  150 mg Oral Q1500  . isosorbide dinitrate  20 mg Oral TID   Continuous Infusions: . sodium chloride 10 mL/hr (09/13/13 1518)  . labetalol (NORMODYNE) infusion Stopped (09/12/13 1130)    Principal Problem:   Hypertensive emergency Active Problems:   Anemia   Acute on chronic kidney disease, stage 4   NICM (nonischemic cardiomyopathy)   Epistaxis   Acute kidney injury   Autoimmune disorder    Georgina Quint, PA-S [facility]  09/14/2013, 9:42 AM  LOS: 3 days   Attending Patient seen and examined, agree with the above assessment and plan. BP stable, hemoglobin down at 6.6, we'll transfuse 1 more unit today. ENT to remove left nasal packing tomorrow, following which he would likely be discharged.  Windell Norfolk MD

## 2013-09-15 LAB — TYPE AND SCREEN
Unit division: 0
Unit division: 0

## 2013-09-15 LAB — DRUGS OF ABUSE SCREEN W/O ALC, ROUTINE URINE
Barbiturate Quant, Ur: NEGATIVE
Benzodiazepines.: NEGATIVE
Cocaine Metabolites: NEGATIVE
Creatinine,U: 111.2 mg/dL
Opiate Screen, Urine: NEGATIVE
Phencyclidine (PCP): NEGATIVE

## 2013-09-15 MED ORDER — SALINE SPRAY 0.65 % NA SOLN
2.0000 | NASAL | Status: DC
  Administered 2013-09-15 (×4): 2 via NASAL
  Filled 2013-09-15 (×2): qty 44

## 2013-09-15 MED ORDER — POLYSACCHARIDE IRON COMPLEX 150 MG PO CAPS: 150.0000 mg | ORAL_CAPSULE | Freq: Every day | ORAL | Status: DC

## 2013-09-15 MED ORDER — FUROSEMIDE 80 MG PO TABS: 80.0000 mg | ORAL_TABLET | Freq: Every day | ORAL | Status: DC

## 2013-09-15 MED ORDER — CLONIDINE HCL 0.2 MG PO TABS: 0.2000 mg | ORAL_TABLET | Freq: Three times a day (TID) | ORAL | Status: DC

## 2013-09-15 MED ORDER — AMLODIPINE BESYLATE 10 MG PO TABS: 10.0000 mg | ORAL_TABLET | Freq: Every day | ORAL | Status: DC

## 2013-09-15 MED ORDER — SALINE SPRAY 0.65 % NA SOLN: 2.0000 | NASAL | Status: DC

## 2013-09-15 MED ORDER — ISOSORBIDE DINITRATE 20 MG PO TABS: 20.0000 mg | ORAL_TABLET | Freq: Three times a day (TID) | ORAL | Status: DC

## 2013-09-15 MED ORDER — HYDRALAZINE HCL 100 MG PO TABS: 100.0000 mg | ORAL_TABLET | Freq: Three times a day (TID) | ORAL | Status: DC

## 2013-09-15 MED ORDER — HYDRALAZINE HCL 25 MG PO TABS: 100.0000 mg | ORAL_TABLET | Freq: Three times a day (TID) | ORAL | Status: DC

## 2013-09-15 MED ORDER — CARVEDILOL 25 MG PO TABS: 25.0000 mg | ORAL_TABLET | Freq: Two times a day (BID) | ORAL | Status: DC

## 2013-09-15 MED ORDER — ISOSORBIDE MONONITRATE ER 60 MG PO TB24: 60.0000 mg | ORAL_TABLET | Freq: Every day | ORAL | Status: DC

## 2013-09-15 NOTE — Care Management Note (Signed)
    Page 1 of 1   09/15/2013     5:05:46 PM   CARE MANAGEMENT NOTE 09/15/2013  Patient:  Matthew Beard, Matthew Beard   Account Number:  [id number]  Date Initiated:  09/15/2013  Documentation initiated by:  Letha Cape  Subjective/Objective Assessment:   dx hypertensive emergency  admit- lives alone. pta indep     Action/Plan:   Anticipated DC Date:  09/15/2013   Anticipated DC Plan:  HOME/SELF CARE      DC Planning Services  CM consult  [facility]      Choice offered to / List presented to:             Status of service:  Completed, signed off Medicare Important Message given?   (If response is "NO", the following Medicare IM given date fields will be blank) Date Medicare IM given:   Date Additional Medicare IM given:    Discharge Disposition:  HOME/SELF CARE  Per UR Regulation:  Reviewed for med. necessity/level of care/duration of stay  If discussed at Long Length of Stay Meetings, dates discussed:    Comments:  09/15/13 17:03 Letha Cape RN, BSN [phone] patient lives alone, pta indep.  Patient has used [facility] in August so not eligible to use this admission. Spoke with MD to change some of his meds to $4 list at [facility], which was done, total came to $31.26,  I printed off coupons form [facility] for scripts as well.  Patient states he can afford this amount.  Patient has transportation at dc.

## 2013-09-15 NOTE — Progress Notes (Signed)
[name] to be D/C'd Home per MD order.  Discussed with the patient and all questions fully answered.    Medication List         amLODipine 10 MG tablet  Commonly known as:  NORVASC  Take 1 tablet (10 mg total) by mouth daily.     carvedilol 25 MG tablet  Commonly known as:  COREG  Take 1 tablet (25 mg total) by mouth 2 (two) times daily with a meal.     cloNIDine 0.2 MG tablet  Commonly known as:  CATAPRES  Take 1 tablet (0.2 mg total) by mouth 3 (three) times daily.     furosemide 80 MG tablet  Commonly known as:  LASIX  Take 1 tablet (80 mg total) by mouth daily.     hydrALAZINE 100 MG tablet  Commonly known as:  APRESOLINE  Take 1 tablet (100 mg total) by mouth every 8 (eight) hours.     iron polysaccharides 150 MG capsule  Commonly known as:  NIFEREX  Take 1 capsule (150 mg total) by mouth daily at 3 pm.     isosorbide dinitrate 20 MG tablet  Commonly known as:  ISORDIL  Take 1 tablet (20 mg total) by mouth 3 (three) times daily.     sodium chloride 0.65 % Soln nasal spray  Commonly known as:  OCEAN  Place 2 sprays into the nose every 2 (two) hours.        VVS, Skin clean, dry and intact without evidence of skin break down, no evidence of skin tears noted. IV catheter discontinued intact. Site without signs and symptoms of complications. Dressing and pressure applied.  An After Visit Summary was printed and given to the patient. Patient escorted via WC, and D/C home via private auto.  Matthew Beard D 09/15/2013 10:24 AM

## 2013-09-15 NOTE — Discharge Summary (Addendum)
PATIENT DETAILS Name: Matthew Beard Age: 37 y.o. Sex: male Date of Birth: June 10, 1976 MRN: [id number]. Admit Date: 09/11/2013 Admitting Physician: Edsel Petrin, DO PCP:No PCP Per Patient  Recommendations for Outpatient Follow-up:  Emphasize on compliance to medications Please check CBC and chemistries at next visit with PCP or cardiology  PRIMARY DISCHARGE DIAGNOSIS:  Principal Problem:   Hypertensive emergency Active Problems:   Anemia   Acute on chronic kidney disease, stage 4   NICM (nonischemic cardiomyopathy)   Epistaxis   Acute kidney injury   Autoimmune disorder      PAST MEDICAL HISTORY: Past Medical History  Diagnosis Date  . Hypertension   . CKD (chronic kidney disease), stage IV   . Normocytic anemia   . Pericardial effusion     Remote hx  . Cerebrovascular disease     Chronic lacunar infarcts  . NICM (nonischemic cardiomyopathy)     DISCHARGE MEDICATIONS:   Medication List         amLODipine 10 MG tablet  Commonly known as:  NORVASC  Take 1 tablet (10 mg total) by mouth daily.     carvedilol 25 MG tablet  Commonly known as:  COREG  Take 1 tablet (25 mg total) by mouth 2 (two) times daily with a meal.     cloNIDine 0.2 MG tablet  Commonly known as:  CATAPRES  Take 1 tablet (0.2 mg total) by mouth 3 (three) times daily.     furosemide 80 MG tablet  Commonly known as:  LASIX  Take 1 tablet (80 mg total) by mouth daily.     hydrALAZINE 25 MG tablet  Commonly known as:  APRESOLINE  Take 4 tablets (100 mg total) by mouth 3 (three) times daily.     iron polysaccharides 150 MG capsule  Commonly known as:  NIFEREX  Take 1 capsule (150 mg total) by mouth daily at 3 pm.     isosorbide dinitrate 20 MG tablet  Commonly known as:  ISORDIL  Take 1 tablet (20 mg total) by mouth 3 (three) times daily.     isosorbide mononitrate 60 MG 24 hr tablet  Commonly known as:  IMDUR  Take 1 tablet (60 mg total) by mouth daily.     sodium chloride 0.65 %  Soln nasal spray  Commonly known as:  OCEAN  Place 2 sprays into the nose every 2 (two) hours.        ALLERGIES:   Allergies  Allergen Reactions  . Amoxicillin Hives    BRIEF HPI:  See H&P, Labs, Consult and Test reports for all details in brief,[name] is a 37 yo M with NICM, CKD4, and poorly controlled hypertension who presented with persistent epistaxis  CONSULTATIONS:   pulmonary/intensive care and ENT  PERTINENT RADIOLOGIC STUDIES: No results found.   PERTINENT LAB RESULTS: CBC:  Recent Labs  09/14/13 0515 09/14/13 1227  WBC 7.7 7.5  HGB 6.7* 7.3*  HCT 20.4* 21.9*  PLT 215 191   CMET CMP     Component Value Date/Time   NA 137 09/14/2013 0515   K 3.4* 09/14/2013 0515   CL 104 09/14/2013 0515   CO2 22 09/14/2013 0515   GLUCOSE 115* 09/14/2013 0515   BUN 38* 09/14/2013 0515   CREATININE 4.14* 09/14/2013 0515   CREATININE 2.41* 02/22/2012 1452   CALCIUM 8.4 09/14/2013 0515   PROT 8.1 04/15/2011 1009   ALBUMIN 2.5* 09/13/2013 0425   AST 22 04/15/2011 1009   ALT 20 04/15/2011 1009  ALKPHOS 74 04/15/2011 1009   BILITOT 0.1* 04/15/2011 1009   GFRNONAA 17* 09/14/2013 0515   GFRAA 20* 09/14/2013 0515    GFR Estimated Creatinine Clearance: 21.8 ml/min (by C-G formula based on Cr of 4.14). No results found for this basename: LIPASE, AMYLASE,  in the last 72 hours No results found for this basename: CKTOTAL, CKMB, CKMBINDEX, TROPONINI,  in the last 72 hours No components found with this basename: POCBNP,  No results found for this basename: DDIMER,  in the last 72 hours No results found for this basename: HGBA1C,  in the last 72 hours No results found for this basename: CHOL, HDL, LDLCALC, TRIG, CHOLHDL, LDLDIRECT,  in the last 72 hours No results found for this basename: TSH, T4TOTAL, FREET3, T3FREE, THYROIDAB,  in the last 72 hours No results found for this basename: VITAMINB12, FOLATE, FERRITIN, TIBC, IRON, RETICCTPCT,  in the last 72 hours Coags: No  results found for this basename: PT, INR,  in the last 72 hours Microbiology: No results found for this or any previous visit (from the past 240 hour(s)).   BRIEF HOSPITAL COURSE:   Principal Problem:   Hypertensive emergency - Patient was actually admitted to the hospital by pulmonary critical care, on admission he had markedly elevated blood pressure with persistent epistaxis. Patient has a known history of being noncompliant to medications because of his financial issues. He was admitted to the ICU and started on amiodarone drip, and then transitioned to his usual medications. Upon stability he was transferred to [facility] for further continued he of care. Blood pressure is controlled on the above medications, he has been counseled repeatedly regarding the importance of compliance.  Active Problems: Persistent epistaxis - Patient was seen by ENT, and a left nasal pack was placed. His breathing slowly stopped, his nasal pack was then removed on 10/28. No further bleeding was observed He was seen by Dr. Jenne Pane, who recommended no further antibiotic care, he advised the patient to not blow his nose very hard and also to keep using as needed nasal spray. - Followup with Dr. Jenne Pane in 2 weeks  Stage IV chronic kidney disease - Creatinine is very close to her usual baseline, he has been advised to followup with his primary nephrologist at his next scheduled appointment.  Chronic systolic heart failure - Clinically compensated during this entire hospital stay. - He has been asked to keep his appointment with his primary cardiologist - Importance of compliance to medications has been repeatedly be emphasized during this hospital stay  Anemia - Acute on chronic anemia, acute likely secondary to blood loss from epistaxis, chronic pain likely secondary to chronic disease - He was transfused a total of 2 units of PRBC. Last hemoglobin was 7.3. - He would need very close monitoring of  his CBC and renal function in the outpatient setting.    TODAY-DAY OF DISCHARGE:  Subjective:   [name] today has no headache,no chest abdominal pain,no new weakness tingling or numbness, feels much better wants to go home today.   Objective:   Blood pressure 155/87, pulse 68, temperature 98.5 F (36.9 C), temperature source Oral, resp. rate 18, height 5\' 6"  (1.676 m), weight 72.9 kg (160 lb 11.5 oz), SpO2 98.00%.  Intake/Output Summary (Last 24 hours) at 09/15/13 1112 Last data filed at 09/15/13 1056  Gross per 24 hour  Intake 278.83 ml  Output      0 ml  Net 278.83 ml   [employer]  09/13/13 1025 09/14/13 0500 09/15/13 0623  Weight: 75.9 kg (167 lb 5.3 oz) 72.258 kg (159 lb 4.8 oz) 72.9 kg (160 lb 11.5 oz)    Exam Awake Alert, Oriented *3, No new F.N deficits, Normal affect [place].AT,PERRAL Supple Neck,No JVD, No cervical lymphadenopathy appriciated.  Symmetrical Chest wall movement, Good air movement bilaterally, CTAB RRR,No Gallops,Rubs or new Murmurs, No Parasternal Heave +ve B.Sounds, Abd Soft, Non tender, No organomegaly appriciated, No rebound -guarding or rigidity. No Cyanosis, Clubbing or edema, No new Rash or bruise  DISCHARGE CONDITION: Stable  DISPOSITION: Home  DISCHARGE INSTRUCTIONS:    Activity:  As tolerated   Diet recommendation: Heart Healthy diet   Discharge Orders   Future Appointments Provider Department Dept Phone   09/16/2013 11:45 AM Lars Masson, MD [facility] Office [phone]   09/28/2013 2:00 PM Mc-Site 3 Echo Echo 2 [facility] SITE 3 ECHO LAB [phone]   10/06/2013 10:00 AM Mc-Cv Us1 [place] [facility] [address] [phone]   10/06/2013 10:30 AM Mc-Cv Us1 [place] [facility] [address] [phone]   10/06/2013 11:15 AM Pryor Ochoa, MD [facility] [phone]   10/19/2013 10:30 AM [facility] Financial Counselor [place]  [facility] [phone]   10/19/2013 11:00 AM Jeanann Lewandowsky, MD [facility] [phone]   Future Orders Complete By Expires   (HEART FAILURE PATIENTS) Call MD:  Anytime you have any of the following symptoms: 1) 3 pound weight gain in 24 hours or 5 pounds in 1 week 2) shortness of breath, with or without a dry hacking cough 3) swelling in the hands, feet or stomach 4) if you have to sleep on extra pillows at night in order to breathe.  As directed    Diet - low sodium heart healthy  As directed    Increase activity slowly  As directed       Follow-up Information   Follow up with [facility] On 10/19/2013. (10:30 for orange card apt and 11 am for hospital follow up, please bring completed paperwork )    Contact information:   [address] [place] [address] [phone]      Follow up with BATES, DWIGHT, MD. Schedule an appointment as soon as possible for a visit in 2 weeks.   Specialty:  Otolaryngology   Contact information:   [address] [place] [address] [phone]       Follow up with Tobias Alexander, Rexene Edison, MD On 09/16/2013. (appt at 11:45 am)    Specialty:  Cardiology   Contact information:   [address] [place] [address] [phone]         Total Time spent on discharge equals 45 minutes.  SignedJeoffrey Massed 09/15/2013 11:12 AM

## 2013-09-15 NOTE — Progress Notes (Signed)
  Subjective: No nasal bleeding since left-sided pack placement.  Objective: Vital signs in last 24 hours: Temp:  [97.5 F (36.4 C)-98.5 F (36.9 C)] 98.5 F (36.9 C) (10/28 1610) Pulse Rate:  [57-69] 68 (10/28 0623) Resp:  [18] 18 (10/27 0925) BP: (109-151)/(56-86) 151/86 mmHg (10/28 0731) SpO2:  [97 %-100 %] 98 % (10/28 0623) Weight:  [72.9 kg (160 lb 11.5 oz)] 72.9 kg (160 lb 11.5 oz) (10/28 0623) Last BM Date: 09/11/13  Intake/Output from previous day: 10/27 0701 - 10/28 0700 In: 798.3 [I.V.:485.8; Blood:312.5] Out: -  Intake/Output this shift:    General appearance: alert, cooperative and no distress Nose: Left nasal passage with Merocel pack in place.  No bleeding.  Lab Results:   Recent Labs  09/14/13 0515 09/14/13 1227  WBC 7.7 7.5  HGB 6.7* 7.3*  HCT 20.4* 21.9*  PLT 215 191   BMET  Recent Labs  09/13/13 0425 09/14/13 0515  NA 140 137  K 3.5 3.4*  CL 108 104  CO2 22 22  GLUCOSE 94 115*  BUN 38* 38*  CREATININE 4.11* 4.14*  CALCIUM 8.2* 8.4   PT/INR No results found for this basename: LABPROT, INR,  in the last 72 hours ABG No results found for this basename: PHART, PCO2, PO2, HCO3,  in the last 72 hours  Studies/Results: No results found.  Anti-infectives: Anti-infectives   Start     Dose/Rate Route Frequency Ordered Stop   09/11/13 2345  doxycycline (VIBRA-TABS) tablet 100 mg     100 mg Oral Every 12 hours 09/11/13 2336        Assessment/Plan: s/p left nasal packing for epistaxis Left nasal pack removed without bleeding.  He was instructed not to blow the nose.  He should spray with saline spray several times per day.  Follow-up with me in two weeks.  LOS: 4 days    Kambryn Dapolito 09/15/2013

## 2013-09-16 ENCOUNTER — Ambulatory Visit: Payer: Self-pay | Admitting: Cardiology

## 2013-09-28 ENCOUNTER — Encounter: Payer: Self-pay | Admitting: Cardiology

## 2013-09-28 ENCOUNTER — Ambulatory Visit (HOSPITAL_COMMUNITY): Payer: Medicaid Other | Attending: Cardiology | Admitting: Radiology

## 2013-09-28 ENCOUNTER — Other Ambulatory Visit: Payer: Self-pay

## 2013-09-28 ENCOUNTER — Ambulatory Visit (INDEPENDENT_AMBULATORY_CARE_PROVIDER_SITE_OTHER): Payer: Medicaid Other | Admitting: Cardiology

## 2013-09-28 VITALS — BP 237/149 | HR 72 | Ht 66.0 in | Wt 160.0 lb

## 2013-09-28 DIAGNOSIS — I255 Ischemic cardiomyopathy: Secondary | ICD-10-CM

## 2013-09-28 DIAGNOSIS — I2789 Other specified pulmonary heart diseases: Secondary | ICD-10-CM | POA: Insufficient documentation

## 2013-09-28 DIAGNOSIS — I2589 Other forms of chronic ischemic heart disease: Secondary | ICD-10-CM | POA: Insufficient documentation

## 2013-09-28 DIAGNOSIS — I079 Rheumatic tricuspid valve disease, unspecified: Secondary | ICD-10-CM | POA: Insufficient documentation

## 2013-09-28 DIAGNOSIS — I509 Heart failure, unspecified: Secondary | ICD-10-CM | POA: Insufficient documentation

## 2013-09-28 DIAGNOSIS — I428 Other cardiomyopathies: Secondary | ICD-10-CM

## 2013-09-28 DIAGNOSIS — I679 Cerebrovascular disease, unspecified: Secondary | ICD-10-CM | POA: Insufficient documentation

## 2013-09-28 DIAGNOSIS — I10 Essential (primary) hypertension: Secondary | ICD-10-CM | POA: Insufficient documentation

## 2013-09-28 DIAGNOSIS — I059 Rheumatic mitral valve disease, unspecified: Secondary | ICD-10-CM | POA: Insufficient documentation

## 2013-09-28 NOTE — Progress Notes (Signed)
Patient ID: Matthew Beard, male   DOB: 1976-06-18, 37 y.o.   MRN: [id number]  Patient Name: KURK CORNIEL Date of Encounter: 09/28/2013  Primary Care Provider:  No PCP Per Patient Primary Cardiologist:  Tobias Alexander, H  Patient Profile  Hypertension, CHF  Problem List   Active Problems:  Chronic systolic CHF (congestive heart failure)  Acute on chronic kidney disease, stage 4  Hypertensive cardiomyopathy  Uncontrolled hypertension  Anemia  History of noncompliance with medical treatment  Mild pulmonary hypertension  Abnormal finding on echocardiogram  Cerebrovascular disease  Past Medical History  Diagnosis Date  . Hypertension   . CKD (chronic kidney disease), stage IV   . Normocytic anemia   . Pericardial effusion     Remote hx  . Cerebrovascular disease     Chronic lacunar infarcts  . NICM (nonischemic cardiomyopathy)    No past surgical history on file.  Allergies  Allergies  Allergen Reactions  . Amoxicillin Hives    HPI  37 year old male with hypertension that was diagnosed in his late twenties. He has has multiple hospital visits for hypertensive urgency in the past. During the last one in August 2014 when he presented with CHf due to hypertensive emergency and was found to have diffuse LV dysfunction (LV EF 20%). No prior cath or ischemic work up. He was diuresed, his BP meds were optimized and sent home. He is compliant with his meds. He currently feels well, but has dyspnea on strenuous exertion. No palpitations or syncope, no chest pain.  The patient feels much better, however he is not taking his meds as he states that he cant afford them.   Home Medications  Prior to Admission medications   Medication Sig Start Date End Date Taking? Authorizing Provider  amLODipine (NORVASC) 10 MG tablet Take 10 mg by mouth daily.    Historical Provider, MD  aspirin EC 325 MG EC tablet Take 1 tablet (325 mg total) by mouth daily. 07/04/13   Roger A  Arguello, PA-C  carvedilol (COREG) 25 MG tablet Take 1 tablet (25 mg total) by mouth 2 (two) times daily with a meal. 08/26/13   Lars Masson, MD  cloNIDine (CATAPRES) 0.2 MG tablet Take 1 tablet (0.2 mg total) by mouth 3 (three) times daily. 07/04/13   Roger A Arguello, PA-C  furosemide (LASIX) 80 MG tablet Take 1 tablet (80 mg total) by mouth daily. 08/26/13   Lars Masson, MD  hydrALAZINE (APRESOLINE) 100 MG tablet Take 1 tablet (100 mg total) by mouth every 8 (eight) hours. 07/04/13   Roger A Arguello, PA-C  isosorbide dinitrate (ISORDIL) 20 MG tablet Take 1 tablet (20 mg total) by mouth 3 (three) times daily. 08/26/13   Lars Masson, MD    Family History  Family History  Problem Relation Age of Onset  . Hypertension Mother   . Hypertension Father   . Stroke Mother   . Stroke Father   . Heart disease Mother   . Heart disease Father   . Hypertension Brother     Social History  History   Social History  . Marital Status: Single    Spouse Name: N/A    Number of Children: N/A  . Years of Education: N/A   Occupational History  . Not on file.   Social History Main Topics  . Smoking status: Never Smoker   . Smokeless tobacco: Never Used  . Alcohol Use: No  . Drug Use: No  .  Sexual Activity: Not on file   Other Topics Concern  . Not on file   Social History Narrative  . No narrative on file     Review of Systems General:  No chills, fever, night sweats or weight changes.  Cardiovascular:  No chest pain, dyspnea on exertion, edema, orthopnea, palpitations, paroxysmal nocturnal dyspnea. Dermatological: No rash, lesions/masses Respiratory: No cough, dyspnea Urologic: No hematuria, dysuria Abdominal:   No nausea, vomiting, diarrhea, bright red blood per rectum, melena, or hematemesis Neurologic:  No visual changes, wkns, changes in mental status. All other systems reviewed and are otherwise negative except as noted above.  Physical Exam  Blood pressure  237/149, pulse 72, height 5\' 6"  (1.676 m), weight 160 lb (72.576 kg), SpO2 99.00%.  General: Pleasant, NAD Psych: Normal affect. Neuro: Alert and oriented X 3. Moves all extremities spontaneously. HEENT: Normal  Neck: Supple without bruits or JVD. Lungs:  Resp regular and unlabored, CTA. Heart: RRR no s3, s4, or murmurs. Abdomen: Soft, non-tender, non-distended, BS + x 4.  Extremities: No clubbing, cyanosis or edema. DP/PT/Radials 2+ and equal bilaterally.  Accessory Clinical Findings  ECG - SR, 80 BPM, LAE, LVH with repolarizations, QTc   Echocardiogram: 06/28/2013 Left ventricle: The cavity size was moderately dilated. There was moderate concentric hypertrophy. The estimated ejection fraction was 20%. Diffuse hypokinesis. There was mild spontaneous echo contrast, indicative of stasis. - Aortic valve: Trivial regurgitation. - Mitral valve: Moderate regurgitation. - Left atrium: The atrium was moderately dilated. - Right ventricle: The cavity size was mildly dilated. Wall thickness was normal. - Right atrium: The atrium was moderately dilated. - Atrial septum: No defect or patent foramen ovale was identified. - Tricuspid valve: Severe regurgitation. - Pulmonary arteries: PA peak pressure: 52mm Hg (S). Impressions:   Echocardiogram 09/28/13 Left ventricle: The cavity size was normal. Wall thickness was increased in a pattern of severe LVH. Systolic function was mildly to moderately reduced. The estimated ejection fraction was in the range of 40% to 45%. Diffuse hypokinesis. Doppler parameters are consistent with abnormal left ventricular relaxation (grade 1 diastolic dysfunction).  ECG: SR, HR 72 BPM, LVH with repolarization abnormalities, prolonged QT    Assessment & Plan  37 year old male   1. Chronic systolic CHF due to poorly controlled hypertension. The patient is currently euvolimic. We will continue the same dose of Lasix. We will uptitrate carvedilol to 25 mg PO  BID, hold ACEI or ARB as his has CKD stage IV.  Echo shows improved LV EF from 20% to 40-45% and improved pulmonary hypertension.   We explained the importance of taking his meds as it affects his heart failure as well as kidney function, we printed a list of generic medications for the patient:  Carvedilol ISDN Amlodipine Hydralazine Clonidine  We also gave him instructions how to apply for an orange card.  2. Hypertension - poorly controlled, today 240/120 mmHg. Please see above  3. 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 Prolonged QT- no palpitations, we will continue high dose of carvedilol.  Follow up in 3 month   Tobias Alexander, Rexene Edison, MD 09/28/2013, 3:51 PM

## 2013-09-28 NOTE — Patient Instructions (Signed)
Your physician recommends that you schedule a follow-up appointment in: 3 months with Dr. Delton See

## 2013-09-28 NOTE — Progress Notes (Signed)
Echocardiogram performed.  

## 2013-10-05 ENCOUNTER — Encounter: Payer: Self-pay | Admitting: Vascular Surgery

## 2013-10-06 ENCOUNTER — Ambulatory Visit (INDEPENDENT_AMBULATORY_CARE_PROVIDER_SITE_OTHER)
Admission: RE | Admit: 2013-10-06 | Discharge: 2013-10-06 | Disposition: A | Discharge status: Home / Self Care | Payer: Medicaid Other | Source: Ambulatory Visit | Attending: Vascular Surgery | Admitting: Vascular Surgery

## 2013-10-06 ENCOUNTER — Other Ambulatory Visit: Payer: Self-pay

## 2013-10-06 ENCOUNTER — Ambulatory Visit (INDEPENDENT_AMBULATORY_CARE_PROVIDER_SITE_OTHER): Payer: Medicaid Other | Admitting: Vascular Surgery

## 2013-10-06 ENCOUNTER — Ambulatory Visit (HOSPITAL_COMMUNITY)
Admission: RE | Admit: 2013-10-06 | Discharge: 2013-10-06 | Disposition: A | Discharge status: Home / Self Care | Payer: Medicaid Other | Source: Ambulatory Visit | Attending: Vascular Surgery | Admitting: Vascular Surgery

## 2013-10-06 ENCOUNTER — Encounter: Payer: Self-pay | Admitting: Vascular Surgery

## 2013-10-06 VITALS — BP 213/134 | HR 79 | Temp 99.1°F | Resp 16 | Ht 66.0 in | Wt 167.0 lb

## 2013-10-06 DIAGNOSIS — Z01810 Encounter for preprocedural cardiovascular examination: Secondary | ICD-10-CM

## 2013-10-06 DIAGNOSIS — N183 Chronic kidney disease, stage 3 unspecified: Secondary | ICD-10-CM | POA: Insufficient documentation

## 2013-10-06 NOTE — Progress Notes (Signed)
Subjective:     Patient ID: Matthew Beard, male   DOB: [birth year], 37 y.o.   MRN: [id number]  HPI this 37 year old male was referred by Dr. Hyman Hopes for evaluation of vascular access. He is right-handed. He is not on hemodialysis. Currently he is stage III or 4 chronic renal insufficiency. He has a history of a nonischemic cardiomyopathy. Past Medical History  Diagnosis Date  . Hypertension   . CKD (chronic kidney disease), stage IV   . Normocytic anemia   . Pericardial effusion     Remote hx  . Cerebrovascular disease     Chronic lacunar infarcts  . NICM (nonischemic cardiomyopathy)     History  Substance Use Topics  . Smoking status: Never Smoker   . Smokeless tobacco: Never Used  . Alcohol Use: No    Family History  Problem Relation Age of Onset  . Hypertension Mother   . Stroke Mother   . Heart disease Mother     Heart Disease before age 73  . Hypertension Father   . Stroke Father   . Heart disease Father     Heart Disease before age 51  . Hypertension Brother     Allergies  Allergen Reactions  . Amoxicillin Hives    Current outpatient prescriptions:amLODipine (NORVASC) 10 MG tablet, Take 1 tablet (10 mg total) by mouth daily., Disp: 30 tablet, Rfl: 0;  carvedilol (COREG) 25 MG tablet, Take 1 tablet (25 mg total) by mouth 2 (two) times daily with a meal., Disp: 60 tablet, Rfl: 0;  cloNIDine (CATAPRES) 0.2 MG tablet, Take 1 tablet (0.2 mg total) by mouth 3 (three) times daily., Disp: 90 tablet, Rfl: 0 furosemide (LASIX) 80 MG tablet, Take 1 tablet (80 mg total) by mouth daily., Disp: 30 tablet, Rfl: 0;  hydrALAZINE (APRESOLINE) 25 MG tablet, Take 4 tablets (100 mg total) by mouth 3 (three) times daily., Disp: 90 tablet, Rfl: 0;  iron polysaccharides (NIFEREX) 150 MG capsule, Take 1 capsule (150 mg total) by mouth daily at 3 pm., Disp: 30 capsule, Rfl: 0 isosorbide dinitrate (ISORDIL) 20 MG tablet, Take 1 tablet (20 mg total) by mouth 3 (three) times daily., Disp: 90 tablet,  Rfl: 0;  isosorbide mononitrate (IMDUR) 60 MG 24 hr tablet, Take 1 tablet (60 mg total) by mouth daily., Disp: 90 tablet, Rfl: 0;  sodium chloride (OCEAN) 0.65 % SOLN nasal spray, Place 2 sprays into the nose every 2 (two) hours., Disp: 480 mL, Rfl: 0  BP 213/134  Pulse 79  Temp(Src) 99.1 F (37.3 C) (Oral)  Resp 16  Ht 5\' 6"  (1.676 m)  Wt 167 lb (75.751 kg)  BMI 26.97 kg/m2  SpO2 100%  Body mass index is 26.97 kg/(m^2).         Review of Systems denies chest pain, dyspnea on exertion, PND, orthopnea, hemoptysis, claudication. All other systems negative and complete review of systems     Objective:   Physical Exam BP 213/134  Pulse 79  Temp(Src) 99.1 F (37.3 C) (Oral)  Resp 16  Ht 5\' 6"  (1.676 m)  Wt 167 lb (75.751 kg)  BMI 26.97 kg/m2  SpO2 100%  Gen.-alert and oriented x3 in no apparent distress HEENT normal for age Lungs no rhonchi or wheezing Cardiovascular regular rhythm no murmurs carotid pulses 3+ palpable no bruits audible Abdomen soft nontender no palpable masses Musculoskeletal free of  major deformities Skin clear -no rashes Neurologic normal Lower extremities 3+ femoral and dorsalis pedis pulses palpable bilaterally with no edema  Left upper extremity appears to have satisfactory cephalic vein in forearm.  Today I ordered bilateral upper extremity vein mapping which I reviewed and interpreted. The left cephalic vein appears to be of good caliber from the wrist to the shoulder level.        Assessment:     Chronic renal insufficiency-stage III or 4-needs vascular access    Plan:     Plan left radial-cephalic AV fistula on Monday, December 1 by Dr. early Risks of this not maturing satisfactorily were discussed with patient and the possibility of the brachial cephalic fistula was also discussed

## 2013-10-14 ENCOUNTER — Encounter: Payer: Self-pay | Admitting: Nephrology

## 2013-10-14 NOTE — Pre-Procedure Instructions (Signed)
Matthew Beard  10/14/2013   Your procedure is scheduled on:  Mon, Dec 1 @ 7:30 AM  Report to [facility] Short Stay Entrance A at 5:30AM.  Call this number if you have problems the morning of surgery: [phone]   Remember:   Do not eat food or drink liquids after midnight.   Take these medicines the morning of surgery with A SIP OF WATER: Amlodipine(Norvasc),Carvedilol(Coreg),Clonidine(Catapres),Hydralazine(Apresoline),and Isosorbide(Imdur)               No Aspirin,Goody's,BC's,Ibuprofen,Fish Oil,or any Herbal Medications   Do not wear jewelry  Do not wear lotions, powders, or colognes. You may wear deodorant.  Do not shave 48 hours prior to surgery. Men may shave face and neck.  Do not bring valuables to the hospital.  [facility] is not responsible                  for any belongings or valuables.               Contacts, dentures or bridgework may not be worn into surgery.  Leave suitcase in the car. After surgery it may be brought to your room.  For patients admitted to the hospital, discharge time is determined by your                treatment team.               Patients discharged the day of surgery will not be allowed to drive  home.    Special Instructions: Shower using CHG 2 nights before surgery and the night before surgery.  If you shower the day of surgery use CHG.  Use special wash - you have one bottle of CHG for all showers.  You should use approximately 1/3 of the bottle for each shower.   Please read over the following fact sheets that you were given: Pain Booklet, Coughing and Deep Breathing and Surgical Site Infection Prevention

## 2013-10-16 ENCOUNTER — Encounter (HOSPITAL_COMMUNITY): Payer: Self-pay | Admitting: *Deleted

## 2013-10-16 ENCOUNTER — Inpatient Hospital Stay (HOSPITAL_COMMUNITY)
Admission: RE | Admit: 2013-10-16 | Discharge: 2013-10-16 | Disposition: A | Discharge status: Home / Self Care | Payer: Self-pay | Source: Ambulatory Visit

## 2013-10-16 ENCOUNTER — Encounter (HOSPITAL_COMMUNITY): Payer: Self-pay | Admitting: Pharmacy Technician

## 2013-10-16 NOTE — Progress Notes (Signed)
Patient was unable to make 9:00 PAT appt, did not have a ride.  I spoke with patient by phone, he said he was walking to the hospital and should be there in an hour, it was 9:15 and appointments was for 9:00.  I checked and we did not have any later available appointments. I call patient in the afternoon and did not reach him on his phone, I called Matthew Beard, patient girlfriend and she said that her  Son had seen Matthew Beard fall earlier today and he helped him in the house and patient had been sleeping since.  Matthew Beard awakened patient and he said he was ok, sounded sleepy, I  Instructed patient and Matthew Beard to call [phone] if patient did not awaken more.   Patient said he took blood pressure medication only ithis am.   With patients permission I gave Matthew Beard the instructions for Monday.  Matthew Beard said she will arrrange transportation today.

## 2013-10-16 NOTE — Progress Notes (Signed)
Patient called back and was able to complete SDW call.

## 2013-10-16 NOTE — Progress Notes (Signed)
I left a message  For Dr Arbie Cookey take patient did not make it to PAT appointment.

## 2013-10-18 MED ORDER — VANCOMYCIN HCL IN DEXTROSE 1-5 GM/200ML-% IV SOLN
1000.0000 mg | INTRAVENOUS | Status: AC
  Administered 2013-10-19: 1000 mg via INTRAVENOUS
  Filled 2013-10-18: qty 200

## 2013-10-19 ENCOUNTER — Other Ambulatory Visit (HOSPITAL_COMMUNITY): Payer: Self-pay | Admitting: *Deleted

## 2013-10-19 ENCOUNTER — Ambulatory Visit: Payer: Self-pay

## 2013-10-19 ENCOUNTER — Ambulatory Visit (HOSPITAL_COMMUNITY)
Admission: RE | Admit: 2013-10-19 | Discharge: 2013-10-19 | Disposition: A | Discharge status: Home / Self Care | Payer: Medicaid Other | Source: Ambulatory Visit | Attending: Vascular Surgery | Admitting: Vascular Surgery

## 2013-10-19 ENCOUNTER — Encounter (HOSPITAL_COMMUNITY): Payer: Medicaid Other | Admitting: Vascular Surgery

## 2013-10-19 ENCOUNTER — Encounter (HOSPITAL_COMMUNITY): Payer: Self-pay | Admitting: Critical Care Medicine

## 2013-10-19 ENCOUNTER — Inpatient Hospital Stay: Payer: Self-pay | Admitting: Internal Medicine

## 2013-10-19 ENCOUNTER — Ambulatory Visit (HOSPITAL_COMMUNITY): Payer: Medicaid Other | Admitting: Critical Care Medicine

## 2013-10-19 ENCOUNTER — Encounter (HOSPITAL_COMMUNITY)
Admission: RE | Disposition: A | Discharge status: Home / Self Care | Payer: Self-pay | Source: Ambulatory Visit | Attending: Vascular Surgery

## 2013-10-19 DIAGNOSIS — I129 Hypertensive chronic kidney disease with stage 1 through stage 4 chronic kidney disease, or unspecified chronic kidney disease: Secondary | ICD-10-CM | POA: Insufficient documentation

## 2013-10-19 DIAGNOSIS — I428 Other cardiomyopathies: Secondary | ICD-10-CM | POA: Insufficient documentation

## 2013-10-19 DIAGNOSIS — N184 Chronic kidney disease, stage 4 (severe): Secondary | ICD-10-CM | POA: Insufficient documentation

## 2013-10-19 DIAGNOSIS — Z8673 Personal history of transient ischemic attack (TIA), and cerebral infarction without residual deficits: Secondary | ICD-10-CM | POA: Insufficient documentation

## 2013-10-19 DIAGNOSIS — I509 Heart failure, unspecified: Secondary | ICD-10-CM | POA: Insufficient documentation

## 2013-10-19 HISTORY — DX: Heart failure, unspecified: I50.9

## 2013-10-19 HISTORY — DX: Shortness of breath: R06.02

## 2013-10-19 HISTORY — PX: AV FISTULA PLACEMENT: SHX1204

## 2013-10-19 LAB — POCT I-STAT 4, (NA,K, GLUC, HGB,HCT)
HCT: 23 % — ABNORMAL LOW (ref 39.0–52.0)
Hemoglobin: 7.8 g/dL — ABNORMAL LOW (ref 13.0–17.0)
Potassium: 3.2 mEq/L — ABNORMAL LOW (ref 3.5–5.1)
Sodium: 139 mEq/L (ref 135–145)

## 2013-10-19 SURGERY — ARTERIOVENOUS (AV) FISTULA CREATION
Anesthesia: Monitor Anesthesia Care | Site: Arm Lower | Laterality: Left | Wound class: Clean

## 2013-10-19 MED ORDER — FENTANYL CITRATE 0.05 MG/ML IJ SOLN
INTRAMUSCULAR | Status: DC | PRN
  Administered 2013-10-19: 50 ug via INTRAVENOUS
  Administered 2013-10-19: 25 ug via INTRAVENOUS

## 2013-10-19 MED ORDER — LIDOCAINE-EPINEPHRINE 0.5 %-1:200000 IJ SOLN
INTRAMUSCULAR | Status: AC
  Filled 2013-10-19: qty 1

## 2013-10-19 MED ORDER — OXYCODONE HCL 5 MG PO TABS: 5.0000 mg | ORAL_TABLET | Freq: Once | ORAL | Status: DC | PRN

## 2013-10-19 MED ORDER — SODIUM CHLORIDE 0.9 % IV SOLN: INTRAVENOUS | Status: DC

## 2013-10-19 MED ORDER — 0.9 % SODIUM CHLORIDE (POUR BTL) OPTIME
TOPICAL | Status: DC | PRN
  Administered 2013-10-19: 1000 mL

## 2013-10-19 MED ORDER — MEPERIDINE HCL 25 MG/ML IJ SOLN: 6.2500 mg | INTRAMUSCULAR | Status: DC | PRN

## 2013-10-19 MED ORDER — OXYCODONE-ACETAMINOPHEN 5-325 MG PO TABS: 1.0000 | ORAL_TABLET | ORAL | Status: DC | PRN

## 2013-10-19 MED ORDER — HYDROMORPHONE HCL PF 1 MG/ML IJ SOLN: 0.2500 mg | INTRAMUSCULAR | Status: DC | PRN

## 2013-10-19 MED ORDER — MIDAZOLAM HCL 5 MG/5ML IJ SOLN
INTRAMUSCULAR | Status: DC | PRN
  Administered 2013-10-19: 2 mg via INTRAVENOUS

## 2013-10-19 MED ORDER — PROMETHAZINE HCL 25 MG/ML IJ SOLN: 6.2500 mg | INTRAMUSCULAR | Status: DC | PRN

## 2013-10-19 MED ORDER — PROPOFOL INFUSION 10 MG/ML OPTIME
INTRAVENOUS | Status: DC | PRN
  Administered 2013-10-19: 75 ug/kg/min via INTRAVENOUS

## 2013-10-19 MED ORDER — LIDOCAINE-EPINEPHRINE 0.5 %-1:200000 IJ SOLN
INTRAMUSCULAR | Status: DC | PRN
  Administered 2013-10-19: 50 mL

## 2013-10-19 MED ORDER — ONDANSETRON HCL 4 MG/2ML IJ SOLN
INTRAMUSCULAR | Status: DC | PRN
  Administered 2013-10-19: 4 mg via INTRAVENOUS

## 2013-10-19 MED ORDER — OXYCODONE HCL 5 MG/5ML PO SOLN
5.0000 mg | Freq: Once | ORAL | Status: DC | PRN
Start: 2013-10-19 — End: 2013-10-19

## 2013-10-19 MED ORDER — SODIUM CHLORIDE 0.9 % IR SOLN
Status: DC | PRN
  Administered 2013-10-19: 07:00:00

## 2013-10-19 MED ORDER — SODIUM CHLORIDE 0.9 % IV SOLN
INTRAVENOUS | Status: DC | PRN
  Administered 2013-10-19: 07:00:00 via INTRAVENOUS

## 2013-10-19 SURGICAL SUPPLY — 40 items
APL SKNCLS STERI-STRIP NONHPOA (GAUZE/BANDAGES/DRESSINGS) ×1
ARMBAND PINK RESTRICT EXTREMIT (MISCELLANEOUS) ×2 IMPLANT
BENZOIN TINCTURE PRP APPL 2/3 (GAUZE/BANDAGES/DRESSINGS) ×2 IMPLANT
CANISTER SUCTION 2500CC (MISCELLANEOUS) ×2 IMPLANT
CLIP LIGATING EXTRA MED SLVR (CLIP) ×2 IMPLANT
CLIP LIGATING EXTRA SM BLUE (MISCELLANEOUS) ×2 IMPLANT
COVER PROBE W GEL 5X96 (DRAPES) ×2 IMPLANT
COVER SURGICAL LIGHT HANDLE (MISCELLANEOUS) ×2 IMPLANT
DECANTER SPIKE VIAL GLASS SM (MISCELLANEOUS) ×2 IMPLANT
ELECT REM PT RETURN 9FT ADLT (ELECTROSURGICAL) ×2
ELECTRODE REM PT RTRN 9FT ADLT (ELECTROSURGICAL) ×1 IMPLANT
GEL ULTRASOUND 20GR AQUASONIC (MISCELLANEOUS) IMPLANT
GLOVE BIOGEL PI IND STRL 6 (GLOVE) IMPLANT
GLOVE BIOGEL PI IND STRL 6.5 (GLOVE) IMPLANT
GLOVE BIOGEL PI IND STRL 7.0 (GLOVE) IMPLANT
GLOVE BIOGEL PI INDICATOR 6 (GLOVE) ×1
GLOVE BIOGEL PI INDICATOR 6.5 (GLOVE) ×1
GLOVE BIOGEL PI INDICATOR 7.0 (GLOVE) ×2
GLOVE ECLIPSE 6.5 STRL STRAW (GLOVE) ×1 IMPLANT
GLOVE SS BIOGEL STRL SZ 7.5 (GLOVE) ×1 IMPLANT
GLOVE SUPERSENSE BIOGEL SZ 7.5 (GLOVE) ×1
GOWN STRL NON-REIN LRG LVL3 (GOWN DISPOSABLE) ×5 IMPLANT
GOWN STRL REIN XL XLG (GOWN DISPOSABLE) ×1 IMPLANT
KIT BASIN OR (CUSTOM PROCEDURE TRAY) ×2 IMPLANT
KIT ROOM TURNOVER OR (KITS) ×2 IMPLANT
NDL HYPO 25GX1X1/2 BEV (NEEDLE) ×1 IMPLANT
NEEDLE HYPO 25GX1X1/2 BEV (NEEDLE) ×2 IMPLANT
NS IRRIG 1000ML POUR BTL (IV SOLUTION) ×2 IMPLANT
PACK CV ACCESS (CUSTOM PROCEDURE TRAY) ×2 IMPLANT
PAD ARMBOARD 7.5X6 YLW CONV (MISCELLANEOUS) ×4 IMPLANT
SPONGE GAUZE 4X4 12PLY (GAUZE/BANDAGES/DRESSINGS) ×2 IMPLANT
STRIP CLOSURE SKIN 1/2X4 (GAUZE/BANDAGES/DRESSINGS) ×2 IMPLANT
SUT PROLENE 6 0 CC (SUTURE) ×2 IMPLANT
SUT VIC AB 3-0 SH 27 (SUTURE) ×4
SUT VIC AB 3-0 SH 27X BRD (SUTURE) ×1 IMPLANT
TAPE CLOTH SURG 4X10 WHT LF (GAUZE/BANDAGES/DRESSINGS) ×1 IMPLANT
TOWEL OR 17X24 6PK STRL BLUE (TOWEL DISPOSABLE) ×2 IMPLANT
TOWEL OR 17X26 10 PK STRL BLUE (TOWEL DISPOSABLE) ×2 IMPLANT
UNDERPAD 30X30 INCONTINENT (UNDERPADS AND DIAPERS) ×2 IMPLANT
WATER STERILE IRR 1000ML POUR (IV SOLUTION) ×2 IMPLANT

## 2013-10-19 NOTE — Anesthesia Procedure Notes (Signed)
Procedure Name: MAC Date/Time: 10/19/2013 7:30 AM Performed by: Elon Alas Pre-anesthesia Checklist: Patient identified, Timeout performed, Emergency Drugs available, Suction available and Patient being monitored Patient Re-evaluated:Patient Re-evaluated prior to inductionOxygen Delivery Method: Simple face mask Placement Confirmation: positive ETCO2 and breath sounds checked- equal and bilateral Dental Injury: Teeth and Oropharynx as per pre-operative assessment

## 2013-10-19 NOTE — Interval H&P Note (Signed)
History and Physical Interval Note:  10/19/2013 7:20 AM  Matthew Beard  has presented today for surgery, with the diagnosis of End Stage Renal Disease  The various methods of treatment have been discussed with the patient and family. After consideration of risks, benefits and other options for treatment, the patient has consented to  Procedure(s): ARTERIOVENOUS (AV) FISTULA CREATION- LEFT RADIOCEPHALIC (Left) as a surgical intervention .  The patient's history has been reviewed, patient examined, no change in status, stable for surgery.  I have reviewed the patient's chart and labs.  Questions were answered to the patient's satisfaction.     Ronell Duffus

## 2013-10-19 NOTE — Anesthesia Preprocedure Evaluation (Addendum)
Anesthesia Evaluation  Patient identified by MRN, date of birth, ID band Patient awake    Reviewed: Allergy & Precautions, H&P , NPO status , Patient's Chart, lab work & pertinent test results, reviewed documented beta blocker date and time   History of Anesthesia Complications Negative for: history of anesthetic complications  Airway Mallampati: II TM Distance: >3 FB Neck ROM: Full    Dental  (+) Dental Advisory Given and Teeth Intact   Pulmonary neg pulmonary ROS,  breath sounds clear to auscultation  Pulmonary exam normal       Cardiovascular hypertension, Pt. on home beta blockers and Pt. on medications +CHF Rhythm:Regular Rate:Normal  Echo 09/28/13 -  Left ventricle: The cavity size was normal. Wall thickness   was increased in a pattern of severe LVH. Systolic   function was mildly to moderately reduced. The estimated   ejection fraction was in the range of 40% to 45%. Diffuse   hypokinesis. Doppler parameters are consistent with   abnormal left ventricular relaxation (grade 1 diastolic   dysfunction). - Mitral valve: Mild to moderate regurgitation. - Left atrium: The atrium was moderately dilated. - Right ventricle: Systolic function was mildly reduced. - Pulmonary arteries: Systolic pressure was mildly to   moderately increased. PA peak pressure: 42mm Hg (S). Impressions:  - Compared to study of 06/28/13, LV function has improved.     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)     Musculoskeletal   Abdominal   Peds  Hematology  (+) Blood dyscrasia (Hb 7.8), anemia ,   Anesthesia Other Findings   Reproductive/Obstetrics                       Anesthesia Physical Anesthesia Plan  ASA: III  Anesthesia Plan: MAC   Post-op Pain Management:    Induction: Intravenous  Airway Management  Planned: Simple Face Mask  Additional Equipment:   Intra-op Plan:   Post-operative Plan:   Informed Consent: I have reviewed the patients History and Physical, chart, labs and discussed the procedure including the risks, benefits and alternatives for the proposed anesthesia with the patient or authorized representative who has indicated his/her understanding and acceptance.   Dental advisory given  Plan Discussed with: Anesthesiologist, Surgeon and CRNA  Anesthesia Plan Comments: (Plan routine monitors, MAC)       Anesthesia Quick Evaluation

## 2013-10-19 NOTE — Anesthesia Postprocedure Evaluation (Signed)
  Anesthesia Post-op Note  Patient: Matthew Beard  Procedure(s) Performed: Procedure(s): ARTERIOVENOUS (AV) FISTULA CREATION- LEFT RADIOCEPHALIC (Left)  Patient Location: PACU  Anesthesia Type:MAC  Level of Consciousness: awake, alert , oriented and patient cooperative  Airway and Oxygen Therapy: Patient Spontanous Breathing  Post-op Pain: none  Post-op Assessment: Post-op Vital signs reviewed, Patient's Cardiovascular Status Stable, Respiratory Function Stable, Patent Airway, No signs of Nausea or vomiting and Pain level controlled  Post-op Vital Signs: Reviewed and stable  Complications: No apparent anesthesia complications

## 2013-10-19 NOTE — Preoperative (Signed)
Beta Blockers   Reason not to administer Beta Blockers:Not Applicable, pt took carvedilol 12/1

## 2013-10-19 NOTE — H&P (View-Only) (Signed)
Subjective:     Patient ID: Matthew Beard, male   DOB: 12/29/1975, 37 y.o.   MRN: [id number]  HPI this 37-year-old male was referred by Dr. Webb for evaluation of vascular access. He is right-handed. He is not on hemodialysis. Currently he is stage III or 4 chronic renal insufficiency. He has a history of a nonischemic cardiomyopathy. Past Medical History  Diagnosis Date  . Hypertension   . CKD (chronic kidney disease), stage IV   . Normocytic anemia   . Pericardial effusion     Remote hx  . Cerebrovascular disease     Chronic lacunar infarcts  . NICM (nonischemic cardiomyopathy)     History  Substance Use Topics  . Smoking status: Never Smoker   . Smokeless tobacco: Never Used  . Alcohol Use: No    Family History  Problem Relation Age of Onset  . Hypertension Mother   . Stroke Mother   . Heart disease Mother     Heart Disease before age 60  . Hypertension Father   . Stroke Father   . Heart disease Father     Heart Disease before age 60  . Hypertension Brother     Allergies  Allergen Reactions  . Amoxicillin Hives    Current outpatient prescriptions:amLODipine (NORVASC) 10 MG tablet, Take 1 tablet (10 mg total) by mouth daily., Disp: 30 tablet, Rfl: 0;  carvedilol (COREG) 25 MG tablet, Take 1 tablet (25 mg total) by mouth 2 (two) times daily with a meal., Disp: 60 tablet, Rfl: 0;  cloNIDine (CATAPRES) 0.2 MG tablet, Take 1 tablet (0.2 mg total) by mouth 3 (three) times daily., Disp: 90 tablet, Rfl: 0 furosemide (LASIX) 80 MG tablet, Take 1 tablet (80 mg total) by mouth daily., Disp: 30 tablet, Rfl: 0;  hydrALAZINE (APRESOLINE) 25 MG tablet, Take 4 tablets (100 mg total) by mouth 3 (three) times daily., Disp: 90 tablet, Rfl: 0;  iron polysaccharides (NIFEREX) 150 MG capsule, Take 1 capsule (150 mg total) by mouth daily at 3 pm., Disp: 30 capsule, Rfl: 0 isosorbide dinitrate (ISORDIL) 20 MG tablet, Take 1 tablet (20 mg total) by mouth 3 (three) times daily., Disp: 90 tablet,  Rfl: 0;  isosorbide mononitrate (IMDUR) 60 MG 24 hr tablet, Take 1 tablet (60 mg total) by mouth daily., Disp: 90 tablet, Rfl: 0;  sodium chloride (OCEAN) 0.65 % SOLN nasal spray, Place 2 sprays into the nose every 2 (two) hours., Disp: 480 mL, Rfl: 0  BP 213/134  Pulse 79  Temp(Src) 99.1 F (37.3 C) (Oral)  Resp 16  Ht 5' 6" (1.676 m)  Wt 167 lb (75.751 kg)  BMI 26.97 kg/m2  SpO2 100%  Body mass index is 26.97 kg/(m^2).         Review of Systems denies chest pain, dyspnea on exertion, PND, orthopnea, hemoptysis, claudication. All other systems negative and complete review of systems     Objective:   Physical Exam BP 213/134  Pulse 79  Temp(Src) 99.1 F (37.3 C) (Oral)  Resp 16  Ht 5' 6" (1.676 m)  Wt 167 lb (75.751 kg)  BMI 26.97 kg/m2  SpO2 100%  Gen.-alert and oriented x3 in no apparent distress HEENT normal for age Lungs no rhonchi or wheezing Cardiovascular regular rhythm no murmurs carotid pulses 3+ palpable no bruits audible Abdomen soft nontender no palpable masses Musculoskeletal free of  major deformities Skin clear -no rashes Neurologic normal Lower extremities 3+ femoral and dorsalis pedis pulses palpable bilaterally with no edema    Left upper extremity appears to have satisfactory cephalic vein in forearm.  Today I ordered bilateral upper extremity vein mapping which I reviewed and interpreted. The left cephalic vein appears to be of good caliber from the wrist to the shoulder level.        Assessment:     Chronic renal insufficiency-stage III or 4-needs vascular access    Plan:     Plan left radial-cephalic AV fistula on Monday, December 1 by Dr. early Risks of this not maturing satisfactorily were discussed with patient and the possibility of the brachial cephalic fistula was also discussed      

## 2013-10-19 NOTE — Transfer of Care (Signed)
Immediate Anesthesia Transfer of Care Note  Patient: Matthew Beard  Procedure(s) Performed: Procedure(s): ARTERIOVENOUS (AV) FISTULA CREATION- LEFT RADIOCEPHALIC (Left)  Patient Location: PACU  Anesthesia Type:MAC  Level of Consciousness: awake and alert   Airway & Oxygen Therapy: Patient Spontanous Breathing and Patient connected to face mask oxygen  Post-op Assessment: Report given to PACU RN, Post -op Vital signs reviewed and stable and Patient moving all extremities X 4  Post vital signs: Reviewed and stable  Complications: No apparent anesthesia complications

## 2013-10-19 NOTE — Op Note (Signed)
    OPERATIVE REPORT  DATE OF SURGERY: 10/19/2013  PATIENT: Matthew Beard, 37 y.o. male MRN: [id number]  DOB: 03/21/1976  PRE-OPERATIVE DIAGNOSIS: Chronic renal insufficiency  POST-OPERATIVE DIAGNOSIS:  Same  PROCEDURE: Left wrist Cimino radiocephalic AV fistula  SURGEON:  Gretta Began, M.D.  PHYSICIAN ASSISTANT: Nurse  ANESTHESIA:  Local with sedation  EBL: Minimal ml  Total I/O In: 250 [I.V.:250] Out: -   BLOOD ADMINISTERED: None  DRAINS: None  SPECIMEN: None  COUNTS CORRECT:  YES  PLAN OF CARE: PACU   PATIENT DISPOSITION:  PACU - hemodynamically stable  PROCEDURE DETAILS: Patient was taken to the operating room placed supine position where the area of the left arm left wrist were prepped and draped in a sterile fashion. Using local anesthesia incision made from the level of the cephalic vein and the radial artery. The vein was of good caliber. Tributary branches were ligated with 304 0 silk ties and divided. The vein was ligated distally and divided and was mobilized the level of the radial artery. The radial artery was also of good caliber. The artery was occluded proximally and distally was opened 11 blade and sent longitudinally with Potts scissors. The vein was cut to appropriate length and spatulated and sewn end-to-side to the artery with a running 6-0 Prolene suture. Clamps removed and excellent thrill was noted to the vein. There was a large tributary branch that arose above the wrist incision. Using local anesthesia separate small incision was made over this and this branch was ligated. The wounds were irrigated with saline. Hemostasis obtained with cautery. Wounds were closed with 3-0 Vicryl in the subcutaneous subcuticular tissue. Benzoin and Steri-Strips were applied   Gretta Began, M.D. 10/19/2013 8:36 AM

## 2013-10-20 ENCOUNTER — Telehealth: Payer: Self-pay | Admitting: Vascular Surgery

## 2013-10-20 ENCOUNTER — Encounter (HOSPITAL_COMMUNITY): Payer: Medicaid Other

## 2013-10-20 NOTE — Telephone Encounter (Addendum)
Message copied by Fredrich Birks on Tue Oct 20, 2013 11:17 AM ------      Message from: Melene Plan      Created: Mon Oct 19, 2013  1:48 PM                   ----- Message -----         From: Larina Earthly, MD         Sent: 10/19/2013   8:38 AM           To: Reuel Derby, Melene Plan, RN            Had left wrist Cimino fistula assistant nurse. I need to see him in one month in the office ------  10/20/13: lm on home #, dpm

## 2013-10-21 ENCOUNTER — Encounter (HOSPITAL_COMMUNITY): Payer: Self-pay | Admitting: Vascular Surgery

## 2013-11-02 ENCOUNTER — Encounter (HOSPITAL_COMMUNITY)
Admission: RE | Admit: 2013-11-02 | Discharge: 2013-11-02 | Disposition: A | Discharge status: Home / Self Care | Payer: Medicaid Other | Source: Ambulatory Visit | Attending: Nephrology | Admitting: Nephrology

## 2013-11-02 DIAGNOSIS — I129 Hypertensive chronic kidney disease with stage 1 through stage 4 chronic kidney disease, or unspecified chronic kidney disease: Secondary | ICD-10-CM | POA: Diagnosis not present

## 2013-11-02 DIAGNOSIS — Z5181 Encounter for therapeutic drug level monitoring: Secondary | ICD-10-CM | POA: Insufficient documentation

## 2013-11-02 DIAGNOSIS — N184 Chronic kidney disease, stage 4 (severe): Secondary | ICD-10-CM | POA: Diagnosis not present

## 2013-11-02 DIAGNOSIS — D649 Anemia, unspecified: Secondary | ICD-10-CM | POA: Diagnosis present

## 2013-11-02 LAB — POCT HEMOGLOBIN-HEMACUE: Hemoglobin: 8.3 g/dL — ABNORMAL LOW (ref 13.0–17.0)

## 2013-11-02 MED ORDER — EPOETIN ALFA 10000 UNIT/ML IJ SOLN
INTRAMUSCULAR | Status: AC
  Administered 2013-11-02: 10:00:00 10000 [IU] via SUBCUTANEOUS
  Filled 2013-11-02: qty 1

## 2013-11-02 MED ORDER — EPOETIN ALFA 10000 UNIT/ML IJ SOLN: 10000.0000 [IU] | INTRAMUSCULAR | Status: DC

## 2013-11-02 MED ORDER — FERUMOXYTOL INJECTION 510 MG/17 ML
INTRAVENOUS | Status: AC
  Administered 2013-11-02: 10:00:00 510 mg
  Filled 2013-11-02: qty 17

## 2013-11-02 MED ORDER — FERUMOXYTOL INJECTION 510 MG/17 ML: 510.0000 mg | INTRAVENOUS | Status: DC

## 2013-11-02 MED ORDER — SODIUM CHLORIDE 0.9 % IV SOLN
INTRAVENOUS | Status: DC
  Administered 2013-11-02: 10:00:00 via INTRAVENOUS

## 2013-11-06 ENCOUNTER — Other Ambulatory Visit (HOSPITAL_COMMUNITY): Payer: Self-pay | Admitting: *Deleted

## 2013-11-09 ENCOUNTER — Encounter (HOSPITAL_COMMUNITY): Payer: Medicaid Other

## 2013-11-16 ENCOUNTER — Encounter (HOSPITAL_COMMUNITY)
Admission: RE | Admit: 2013-11-16 | Discharge: 2013-11-16 | Disposition: A | Discharge status: Home / Self Care | Payer: Medicaid Other | Source: Ambulatory Visit | Attending: Nephrology | Admitting: Nephrology

## 2013-11-16 DIAGNOSIS — Z5181 Encounter for therapeutic drug level monitoring: Secondary | ICD-10-CM | POA: Diagnosis not present

## 2013-11-16 MED ORDER — CLONIDINE HCL 0.1 MG PO TABS
ORAL_TABLET | ORAL | Status: AC
  Filled 2013-11-16: qty 1

## 2013-11-16 MED ORDER — EPOETIN ALFA 10000 UNIT/ML IJ SOLN
INTRAMUSCULAR | Status: AC
  Administered 2013-11-16: 10000 [IU] via SUBCUTANEOUS
  Filled 2013-11-16: qty 1

## 2013-11-16 MED ORDER — FERUMOXYTOL INJECTION 510 MG/17 ML
INTRAVENOUS | Status: AC
  Administered 2013-11-16: 510 mg via INTRAVENOUS
  Filled 2013-11-16: qty 17

## 2013-11-16 MED ORDER — EPOETIN ALFA 10000 UNIT/ML IJ SOLN
10000.0000 [IU] | INTRAMUSCULAR | Status: DC
  Administered 2013-11-16: 10000 [IU] via SUBCUTANEOUS

## 2013-11-16 MED ORDER — FERUMOXYTOL INJECTION 510 MG/17 ML
510.0000 mg | INTRAVENOUS | Status: AC
  Administered 2013-11-16: 510 mg via INTRAVENOUS

## 2013-11-16 MED ORDER — SODIUM CHLORIDE 0.9 % IV SOLN
INTRAVENOUS | Status: AC
  Administered 2013-11-16: 11:00:00 via INTRAVENOUS

## 2013-11-16 MED ORDER — CLONIDINE HCL 0.1 MG PO TABS
0.1000 mg | ORAL_TABLET | Freq: Once | ORAL | Status: AC | PRN
  Administered 2013-11-16: 11:00:00 0.1 mg via ORAL

## 2013-11-16 NOTE — Progress Notes (Signed)
Called and spoke with [place] at Dr Marland Mcalpine office.  I reported the pt's BP readings despite clonidine and Orders received to give the pt his shot and tell him to go home and take his BP meds.

## 2013-11-23 ENCOUNTER — Encounter (HOSPITAL_COMMUNITY): Payer: Medicaid Other

## 2013-11-24 ENCOUNTER — Encounter: Payer: Self-pay | Admitting: Vascular Surgery

## 2013-11-30 ENCOUNTER — Encounter (HOSPITAL_COMMUNITY)
Admission: RE | Admit: 2013-11-30 | Discharge: 2013-11-30 | Disposition: A | Discharge status: Home / Self Care | Payer: Medicaid Other | Source: Ambulatory Visit | Attending: Nephrology | Admitting: Nephrology

## 2013-11-30 ENCOUNTER — Encounter: Payer: Self-pay | Admitting: Vascular Surgery

## 2013-11-30 DIAGNOSIS — D649 Anemia, unspecified: Secondary | ICD-10-CM | POA: Insufficient documentation

## 2013-11-30 LAB — POCT HEMOGLOBIN-HEMACUE: Hemoglobin: 9.7 g/dL — ABNORMAL LOW (ref 13.0–17.0)

## 2013-11-30 LAB — IRON AND TIBC
IRON: 45 ug/dL (ref 42–135)
Saturation Ratios: 17 % — ABNORMAL LOW (ref 20–55)
TIBC: 263 ug/dL (ref 215–435)
UIBC: 218 ug/dL (ref 125–400)

## 2013-11-30 LAB — FERRITIN: Ferritin: 344 ng/mL — ABNORMAL HIGH (ref 22–322)

## 2013-11-30 MED ORDER — EPOETIN ALFA 10000 UNIT/ML IJ SOLN
INTRAMUSCULAR | Status: AC
  Filled 2013-11-30: qty 1

## 2013-11-30 MED ORDER — EPOETIN ALFA 10000 UNIT/ML IJ SOLN
10000.0000 [IU] | INTRAMUSCULAR | Status: DC
  Administered 2013-11-30: 10:00:00 10000 [IU] via SUBCUTANEOUS

## 2013-12-01 ENCOUNTER — Encounter (INDEPENDENT_AMBULATORY_CARE_PROVIDER_SITE_OTHER): Payer: Self-pay

## 2013-12-01 ENCOUNTER — Ambulatory Visit (INDEPENDENT_AMBULATORY_CARE_PROVIDER_SITE_OTHER): Payer: Medicaid Other | Admitting: Vascular Surgery

## 2013-12-01 ENCOUNTER — Encounter: Payer: Self-pay | Admitting: Vascular Surgery

## 2013-12-01 VITALS — BP 185/116 | HR 69 | Ht 66.0 in | Wt 173.0 lb

## 2013-12-01 DIAGNOSIS — N186 End stage renal disease: Secondary | ICD-10-CM

## 2013-12-01 NOTE — Progress Notes (Signed)
The patient presents today for followup of his wrist Cimino AV fistula by myself on 10/19/2013. He does have chronic renal insufficiency and is not on hemodialysis currently. He has no discomfort associated with this incision.  On physical exam he has a beautiful fistula. It is quite well-developed with an excellent thrill runs very straight and is very superficial.  Impression and plan: Excellent Orion Mole results of AV fistula creation. He will continue his followup with Dr. Hyman Hopes. He'll see [place] again on an as-needed basis. I think that he has an excellent chance of good prolonged use of his Cimino AV fistula

## 2013-12-07 ENCOUNTER — Encounter (HOSPITAL_COMMUNITY): Payer: Medicaid Other

## 2013-12-14 ENCOUNTER — Encounter (HOSPITAL_COMMUNITY): Payer: Medicaid Other

## 2013-12-21 ENCOUNTER — Encounter (HOSPITAL_COMMUNITY)
Admission: RE | Admit: 2013-12-21 | Discharge: 2013-12-21 | Disposition: A | Discharge status: Home / Self Care | Payer: Medicaid Other | Source: Ambulatory Visit | Attending: Nephrology | Admitting: Nephrology

## 2013-12-21 DIAGNOSIS — D649 Anemia, unspecified: Secondary | ICD-10-CM | POA: Insufficient documentation

## 2013-12-21 LAB — POCT HEMOGLOBIN-HEMACUE: Hemoglobin: 9.8 g/dL — ABNORMAL LOW (ref 13.0–17.0)

## 2013-12-21 MED ORDER — EPOETIN ALFA 10000 UNIT/ML IJ SOLN
INTRAMUSCULAR | Status: AC
  Filled 2013-12-21: qty 1

## 2013-12-21 MED ORDER — EPOETIN ALFA 10000 UNIT/ML IJ SOLN
10000.0000 [IU] | INTRAMUSCULAR | Status: DC
  Administered 2013-12-21: 10000 [IU] via SUBCUTANEOUS

## 2013-12-23 ENCOUNTER — Other Ambulatory Visit: Payer: Self-pay | Admitting: Internal Medicine

## 2013-12-28 ENCOUNTER — Encounter (HOSPITAL_COMMUNITY)
Admission: RE | Admit: 2013-12-28 | Discharge: 2013-12-28 | Disposition: A | Discharge status: Home / Self Care | Payer: Medicaid Other | Source: Ambulatory Visit | Attending: Nephrology | Admitting: Nephrology

## 2013-12-28 LAB — FERRITIN: FERRITIN: 185 ng/mL (ref 22–322)

## 2013-12-28 LAB — IRON AND TIBC
Iron: 25 ug/dL — ABNORMAL LOW (ref 42–135)
Saturation Ratios: 11 % — ABNORMAL LOW (ref 20–55)
TIBC: 236 ug/dL (ref 215–435)
UIBC: 211 ug/dL (ref 125–400)

## 2013-12-28 LAB — POCT HEMOGLOBIN-HEMACUE: Hemoglobin: 10 g/dL — ABNORMAL LOW (ref 13.0–17.0)

## 2013-12-28 MED ORDER — EPOETIN ALFA 10000 UNIT/ML IJ SOLN
10000.0000 [IU] | INTRAMUSCULAR | Status: DC
  Administered 2013-12-28: 10000 [IU] via SUBCUTANEOUS

## 2013-12-28 MED ORDER — EPOETIN ALFA 10000 UNIT/ML IJ SOLN
INTRAMUSCULAR | Status: AC
  Filled 2013-12-28: qty 1

## 2013-12-29 ENCOUNTER — Ambulatory Visit: Payer: Self-pay | Admitting: Cardiology

## 2014-01-01 ENCOUNTER — Other Ambulatory Visit (HOSPITAL_COMMUNITY): Payer: Self-pay

## 2014-01-04 ENCOUNTER — Encounter (HOSPITAL_COMMUNITY)
Admission: RE | Admit: 2014-01-04 | Discharge: 2014-01-04 | Disposition: A | Discharge status: Home / Self Care | Payer: Medicaid Other | Source: Ambulatory Visit | Attending: Nephrology | Admitting: Nephrology

## 2014-01-04 LAB — POCT HEMOGLOBIN-HEMACUE: Hemoglobin: 10.6 g/dL — ABNORMAL LOW (ref 13.0–17.0)

## 2014-01-04 MED ORDER — FERUMOXYTOL INJECTION 510 MG/17 ML
INTRAVENOUS | Status: AC
  Administered 2014-01-04: 510 mg
  Filled 2014-01-04: qty 17

## 2014-01-04 MED ORDER — EPOETIN ALFA 10000 UNIT/ML IJ SOLN: 10000.0000 [IU] | INTRAMUSCULAR | Status: DC

## 2014-01-04 MED ORDER — SODIUM CHLORIDE 0.9 % IV SOLN
INTRAVENOUS | Status: DC
  Administered 2014-01-04: 11:00:00 via INTRAVENOUS

## 2014-01-04 MED ORDER — FERUMOXYTOL INJECTION 510 MG/17 ML: 510.0000 mg | INTRAVENOUS | Status: DC

## 2014-01-04 MED ORDER — EPOETIN ALFA 10000 UNIT/ML IJ SOLN
INTRAMUSCULAR | Status: AC
  Administered 2014-01-04: 10000 [IU] via SUBCUTANEOUS
  Filled 2014-01-04: qty 1

## 2014-01-05 ENCOUNTER — Ambulatory Visit: Payer: Self-pay | Admitting: Cardiology

## 2014-01-11 ENCOUNTER — Encounter (HOSPITAL_COMMUNITY): Payer: Medicaid Other

## 2014-01-18 ENCOUNTER — Encounter (HOSPITAL_COMMUNITY): Payer: Medicaid Other

## 2014-01-21 ENCOUNTER — Ambulatory Visit: Payer: Self-pay | Admitting: Cardiology

## 2014-02-02 ENCOUNTER — Encounter: Payer: Self-pay | Admitting: *Deleted

## 2014-02-09 ENCOUNTER — Ambulatory Visit: Payer: Self-pay | Admitting: Cardiology

## 2014-02-11 ENCOUNTER — Inpatient Hospital Stay (HOSPITAL_COMMUNITY)
Admission: EM | Admit: 2014-02-11 | Discharge: 2014-02-15 | DRG: 683 | Disposition: A | Discharge status: Home / Self Care | Payer: Medicaid Other | Attending: Family Medicine | Admitting: Family Medicine

## 2014-02-11 ENCOUNTER — Emergency Department (HOSPITAL_COMMUNITY): Payer: Medicaid Other

## 2014-02-11 ENCOUNTER — Inpatient Hospital Stay (HOSPITAL_COMMUNITY): Payer: Medicaid Other

## 2014-02-11 ENCOUNTER — Encounter (HOSPITAL_COMMUNITY): Payer: Self-pay | Admitting: General Practice

## 2014-02-11 DIAGNOSIS — I679 Cerebrovascular disease, unspecified: Secondary | ICD-10-CM

## 2014-02-11 DIAGNOSIS — M351 Other overlap syndromes: Secondary | ICD-10-CM

## 2014-02-11 DIAGNOSIS — I428 Other cardiomyopathies: Secondary | ICD-10-CM

## 2014-02-11 DIAGNOSIS — N183 Chronic kidney disease, stage 3 unspecified: Secondary | ICD-10-CM

## 2014-02-11 DIAGNOSIS — I509 Heart failure, unspecified: Secondary | ICD-10-CM | POA: Diagnosis present

## 2014-02-11 DIAGNOSIS — R0609 Other forms of dyspnea: Secondary | ICD-10-CM | POA: Diagnosis present

## 2014-02-11 DIAGNOSIS — I10 Essential (primary) hypertension: Secondary | ICD-10-CM

## 2014-02-11 DIAGNOSIS — R0989 Other specified symptoms and signs involving the circulatory and respiratory systems: Secondary | ICD-10-CM | POA: Diagnosis present

## 2014-02-11 DIAGNOSIS — D649 Anemia, unspecified: Secondary | ICD-10-CM

## 2014-02-11 DIAGNOSIS — N179 Acute kidney failure, unspecified: Principal | ICD-10-CM

## 2014-02-11 DIAGNOSIS — N186 End stage renal disease: Secondary | ICD-10-CM

## 2014-02-11 DIAGNOSIS — Z79899 Other long term (current) drug therapy: Secondary | ICD-10-CM

## 2014-02-11 DIAGNOSIS — J811 Chronic pulmonary edema: Secondary | ICD-10-CM

## 2014-02-11 DIAGNOSIS — R600 Localized edema: Secondary | ICD-10-CM

## 2014-02-11 DIAGNOSIS — R0600 Dyspnea, unspecified: Secondary | ICD-10-CM

## 2014-02-11 DIAGNOSIS — D8989 Other specified disorders involving the immune mechanism, not elsewhere classified: Secondary | ICD-10-CM

## 2014-02-11 DIAGNOSIS — N189 Chronic kidney disease, unspecified: Secondary | ICD-10-CM

## 2014-02-11 DIAGNOSIS — N184 Chronic kidney disease, stage 4 (severe): Secondary | ICD-10-CM

## 2014-02-11 DIAGNOSIS — I120 Hypertensive chronic kidney disease with stage 5 chronic kidney disease or end stage renal disease: Secondary | ICD-10-CM | POA: Diagnosis present

## 2014-02-11 DIAGNOSIS — N2581 Secondary hyperparathyroidism of renal origin: Secondary | ICD-10-CM | POA: Diagnosis present

## 2014-02-11 DIAGNOSIS — Z91199 Patient's noncompliance with other medical treatment and regimen due to unspecified reason: Secondary | ICD-10-CM

## 2014-02-11 DIAGNOSIS — I161 Hypertensive emergency: Secondary | ICD-10-CM

## 2014-02-11 DIAGNOSIS — R609 Edema, unspecified: Secondary | ICD-10-CM

## 2014-02-11 DIAGNOSIS — R040 Epistaxis: Secondary | ICD-10-CM

## 2014-02-11 DIAGNOSIS — Z9119 Patient's noncompliance with other medical treatment and regimen: Secondary | ICD-10-CM

## 2014-02-11 DIAGNOSIS — N185 Chronic kidney disease, stage 5: Secondary | ICD-10-CM | POA: Diagnosis present

## 2014-02-11 DIAGNOSIS — E876 Hypokalemia: Secondary | ICD-10-CM | POA: Diagnosis present

## 2014-02-11 DIAGNOSIS — Z01810 Encounter for preprocedural cardiovascular examination: Secondary | ICD-10-CM

## 2014-02-11 LAB — CBC WITH DIFFERENTIAL/PLATELET
BASOS PCT: 0 % (ref 0–1)
Basophils Absolute: 0 10*3/uL (ref 0.0–0.1)
EOS PCT: 2 % (ref 0–5)
Eosinophils Absolute: 0.1 10*3/uL (ref 0.0–0.7)
HCT: 31.2 % — ABNORMAL LOW (ref 39.0–52.0)
Hemoglobin: 10.8 g/dL — ABNORMAL LOW (ref 13.0–17.0)
LYMPHS ABS: 0.5 10*3/uL — AB (ref 0.7–4.0)
Lymphocytes Relative: 11 % — ABNORMAL LOW (ref 12–46)
MCH: 28.3 pg (ref 26.0–34.0)
MCHC: 34.6 g/dL (ref 30.0–36.0)
MCV: 81.9 fL (ref 78.0–100.0)
Monocytes Absolute: 0.3 10*3/uL (ref 0.1–1.0)
Monocytes Relative: 7 % (ref 3–12)
Neutro Abs: 3.7 10*3/uL (ref 1.7–7.7)
Neutrophils Relative %: 81 % — ABNORMAL HIGH (ref 43–77)
PLATELETS: 166 10*3/uL (ref 150–400)
RBC: 3.81 MIL/uL — ABNORMAL LOW (ref 4.22–5.81)
RDW: 17.6 % — ABNORMAL HIGH (ref 11.5–15.5)
WBC: 4.6 10*3/uL (ref 4.0–10.5)

## 2014-02-11 LAB — TROPONIN I
Troponin I: <0.3 ng/mL (ref <0.30)
Troponin I: <0.3 ng/mL (ref <0.30)

## 2014-02-11 LAB — BASIC METABOLIC PANEL
BUN: 81 mg/dL — ABNORMAL HIGH (ref 6–23)
CALCIUM: 7.6 mg/dL — AB (ref 8.4–10.5)
CO2: 16 mEq/L — ABNORMAL LOW (ref 19–32)
Chloride: 99 mEq/L (ref 96–112)
Creatinine, Ser: 6.91 mg/dL — ABNORMAL HIGH (ref 0.50–1.35)
GFR, EST AFRICAN AMERICAN: 11 mL/min — AB (ref >90)
GFR, EST NON AFRICAN AMERICAN: 9 mL/min — AB (ref >90)
GLUCOSE: 108 mg/dL — AB (ref 70–99)
POTASSIUM: 3.5 meq/L — AB (ref 3.7–5.3)
SODIUM: 137 meq/L (ref 137–147)

## 2014-02-11 LAB — PRO B NATRIURETIC PEPTIDE: Pro B Natriuretic peptide (BNP): >70000 pg/mL — ABNORMAL HIGH (ref 0–125)

## 2014-02-11 MED ORDER — SODIUM CHLORIDE 0.9 % IJ SOLN
3.0000 mL | Freq: Two times a day (BID) | INTRAMUSCULAR | Status: DC
  Administered 2014-02-11 – 2014-02-15 (×8): 3 mL via INTRAVENOUS

## 2014-02-11 MED ORDER — FUROSEMIDE 10 MG/ML IJ SOLN
160.0000 mg | Freq: Three times a day (TID) | INTRAVENOUS | Status: DC
  Administered 2014-02-11 – 2014-02-14 (×9): 160 mg via INTRAVENOUS
  Filled 2014-02-11 (×11): qty 16

## 2014-02-11 MED ORDER — FUROSEMIDE 10 MG/ML IJ SOLN
120.0000 mg | Freq: Once | INTRAVENOUS | Status: AC
  Administered 2014-02-11: 120 mg via INTRAVENOUS
  Filled 2014-02-11: qty 12

## 2014-02-11 MED ORDER — FUROSEMIDE 10 MG/ML IJ SOLN
40.0000 mg | Freq: Once | INTRAMUSCULAR | Status: AC
  Administered 2014-02-11: 40 mg via INTRAVENOUS
  Filled 2014-02-11: qty 4

## 2014-02-11 MED ORDER — DARBEPOETIN ALFA-POLYSORBATE 100 MCG/0.5ML IJ SOLN
100.0000 ug | INTRAMUSCULAR | Status: DC
  Administered 2014-02-11: 100 ug via SUBCUTANEOUS
  Filled 2014-02-11: qty 0.5

## 2014-02-11 MED ORDER — HYDRALAZINE HCL 100 MG PO TABS: 100.0000 mg | ORAL_TABLET | Freq: Three times a day (TID) | ORAL | Status: DC

## 2014-02-11 MED ORDER — SODIUM CHLORIDE 0.9 % IV SOLN: 250.0000 mL | INTRAVENOUS | Status: DC | PRN

## 2014-02-11 MED ORDER — CARVEDILOL 25 MG PO TABS
25.0000 mg | ORAL_TABLET | Freq: Two times a day (BID) | ORAL | Status: DC
  Administered 2014-02-11 – 2014-02-15 (×8): 25 mg via ORAL
  Filled 2014-02-11 (×11): qty 1

## 2014-02-11 MED ORDER — ASPIRIN 81 MG PO CHEW
324.0000 mg | CHEWABLE_TABLET | Freq: Once | ORAL | Status: AC
  Administered 2014-02-11: 324 mg via ORAL
  Filled 2014-02-11: qty 4

## 2014-02-11 MED ORDER — CLONIDINE HCL 0.2 MG PO TABS
0.2000 mg | ORAL_TABLET | Freq: Three times a day (TID) | ORAL | Status: DC
  Administered 2014-02-11 – 2014-02-15 (×12): 0.2 mg via ORAL
  Filled 2014-02-11 (×14): qty 1

## 2014-02-11 MED ORDER — ONDANSETRON HCL 4 MG PO TABS: 4.0000 mg | ORAL_TABLET | Freq: Four times a day (QID) | ORAL | Status: DC | PRN

## 2014-02-11 MED ORDER — HYDRALAZINE HCL 50 MG PO TABS
100.0000 mg | ORAL_TABLET | Freq: Three times a day (TID) | ORAL | Status: DC
  Administered 2014-02-11 – 2014-02-14 (×11): 100 mg via ORAL
  Filled 2014-02-11 (×15): qty 2

## 2014-02-11 MED ORDER — SODIUM CHLORIDE 0.9 % IJ SOLN: 3.0000 mL | INTRAMUSCULAR | Status: DC | PRN

## 2014-02-11 MED ORDER — HEPARIN SODIUM (PORCINE) 5000 UNIT/ML IJ SOLN
5000.0000 [IU] | Freq: Three times a day (TID) | INTRAMUSCULAR | Status: DC
  Administered 2014-02-11 – 2014-02-15 (×10): 5000 [IU] via SUBCUTANEOUS
  Filled 2014-02-11 (×14): qty 1

## 2014-02-11 MED ORDER — ONDANSETRON HCL 4 MG/2ML IJ SOLN
4.0000 mg | Freq: Four times a day (QID) | INTRAMUSCULAR | Status: DC | PRN
Start: 2014-02-11 — End: 2014-02-15

## 2014-02-11 MED ORDER — TECHNETIUM TO 99M ALBUMIN AGGREGATED
3.0000 | Freq: Once | INTRAVENOUS | Status: AC | PRN
  Administered 2014-02-11: 3 via INTRAVENOUS

## 2014-02-11 NOTE — ED Notes (Signed)
Prior to checking in, pt sat in waiting room for approx 20 minutes prior to checking in and ate breakfast.

## 2014-02-11 NOTE — ED Provider Notes (Signed)
TIME SEEN: 7:35 AM  CHIEF COMPLAINT: Shortness of breath, peripheral edema  HPI: Patient is a 38 year old male with history of end-stage disease has not yet started dialysis but has a left upper extremity fistula, hypertension, CHF who presents emergency department with 5 days of shortness of breath and lower extremity swelling. Denies any chest pain or chest discomfort. No fevers or cough. He states he does not have a primary care physician. His nephrologist is Dr. Hyman HopesWebb. He denies any history of PE or DVT, recent prolonged immobilization such as long flight or hospitalization, fracture, surgery, trauma, tobacco use.  ROS: See HPI Constitutional: no fever  Eyes: no drainage  ENT: no runny nose   Cardiovascular:  no chest pain  Resp: SOB  GI: no vomiting GU: no dysuria Integumentary: no rash  Allergy: no hives  Musculoskeletal:  leg swelling  Neurological: no slurred speech ROS otherwise negative  PAST MEDICAL HISTORY/PAST SURGICAL HISTORY:  Past Medical History  Diagnosis Date  . Hypertension   . CKD (chronic kidney disease), stage IV   . Normocytic anemia   . Pericardial effusion     Remote hx  . Cerebrovascular disease     Chronic lacunar infarcts  . NICM (nonischemic cardiomyopathy)   . CHF (congestive heart failure)   . Shortness of breath   . Headache(784.0)     MEDICATIONS:  Prior to Admission medications   Medication Sig Start Date End Date Taking? Authorizing Provider  amLODipine (NORVASC) 10 MG tablet Take 1 tablet (10 mg total) by mouth daily. 09/15/13  Yes Shanker Levora DredgeM Ghimire, MD  carvedilol (COREG) 25 MG tablet Take 1 tablet (25 mg total) by mouth 2 (two) times daily with a meal. 09/15/13  Yes Shanker Levora DredgeM Ghimire, MD  cloNIDine (CATAPRES) 0.2 MG tablet Take 1 tablet (0.2 mg total) by mouth 3 (three) times daily. 09/15/13  Yes Shanker Levora DredgeM Ghimire, MD  furosemide (LASIX) 80 MG tablet Take 1 tablet (80 mg total) by mouth daily. 09/15/13  Yes Shanker Levora DredgeM Ghimire, MD   hydrALAZINE (APRESOLINE) 100 MG tablet Take 100 mg by mouth every 8 (eight) hours.   Yes Historical Provider, MD    ALLERGIES:  Allergies  Allergen Reactions  . Amoxicillin Hives    SOCIAL HISTORY:  History  Substance Use Topics  . Smoking status: Never Smoker   . Smokeless tobacco: Never Used  . Alcohol Use: No    FAMILY HISTORY: Family History  Problem Relation Age of Onset  . Hypertension Mother   . Stroke Mother   . Heart disease Mother     Heart Disease before age 760  . Hypertension Father   . Stroke Father   . Heart disease Father     Heart Disease before age 39  . Hypertension Brother     EXAM: BP 152/96  Pulse 79  Temp(Src) 98.2 F (36.8 C) (Oral)  Resp 24  Ht 5\' 6"  (1.676 m)  Wt 160 lb (72.576 kg)  BMI 25.84 kg/m2  SpO2 98% CONSTITUTIONAL: Alert and oriented and responds appropriately to questions. In very mild respiratory distress HEAD: Normocephalic EYES: Conjunctivae clear, PERRL ENT: normal nose; no rhinorrhea; moist mucous membranes; pharynx without lesions noted NECK: Supple, no meningismus, no LAD  CARD: RRR; S1 and S2 appreciated; no murmurs, no clicks, no rubs, no gallops RESP: Patient is tachypneic and has mild bibasilar rales but no rhonchi or wheezing. He is speaking short sentences. No hypoxia. ABD/GI: Normal bowel sounds; non-distended; soft, non-tender, no rebound, no guarding BACK:  The back appears normal and is non-tender to palpation, there is no CVA tenderness EXT: Normal ROM in all joints; non-tender to palpation; pitting edema to the level of his abdomen; normal capillary refill; no cyanosis    SKIN: Normal color for age and race; warm NEURO: Moves all extremities equally PSYCH: The patient's mood and manner are appropriate. Grooming and personal hygiene are appropriate.  MEDICAL DECISION MAKING: Patient here with volume overload. We'll obtain cardiac labs, give IV Lasix. He has no hypoxia but is tachypneic. Patient will need  admission.  ED PROGRESS: Patient's BNP is greater than 70,000. Troponin negative. His chest x-ray shows mild pulmonary edema but no infiltrate. Discussed with Dr. Isidore Moos with nephrology recommends giving patient a total of 160 mg IV Lasix to see if we're able to diurese the patient successfully. She does not feel he needs emergent dialysis at this time. Discussed with hospitalist for admission.  VQ scan shows no mismatch to suggest pulmonary embolus.  POSTERIOR EKG  EKG Interpretation  Date/Time:  Thursday February 11 2014 08:48:33 EDT Ventricular Rate:  74 PR Interval:  191 QRS Duration: 104 QT Interval:  590 QTC Calculation: 655 R Axis:   33 Text Interpretation:  Sinus rhythm Left atrial enlargement LVH with secondary repolarization abnormality Probable anterior infarct, age indeterminate Prolonged QT interval Confirmed by WARD,  DO, KRISTEN [phone]) on 02/11/2014 11:26:54 AM           Layla Maw Ward, DO 02/11/14 1702

## 2014-02-11 NOTE — ED Notes (Signed)
Patient transported to X-ray 

## 2014-02-11 NOTE — Consult Note (Signed)
Matthew Beard is an 38 y.o. male referred by Dr Matthew Beard   Chief Complaint: CKD 5, Volume overload HPI: [name] with CKD 5 admitted for increasing SOB and edema over past week.  VQ neg for PE, CXR shows mild edema. Scr in 2/15 was 5.3 and now 6.9.  Says he has been taking all of his meds as prescribed but unable to name them.  Says appetite and energy level has been good.  No N/V.  Past Medical History  Diagnosis Date  . Hypertension   . CKD (chronic kidney disease), stage IV   . Normocytic anemia   . Pericardial effusion     Remote hx  . Cerebrovascular disease     Chronic lacunar infarcts  . NICM (nonischemic cardiomyopathy)   . CHF (congestive heart failure)   . Shortness of breath   . HYQMVHQI(696.2Headache(784.0)     Past Surgical History  Procedure Laterality Date  . Av fistula placement Left 10/19/2013    Procedure: ARTERIOVENOUS (AV) FISTULA CREATION- LEFT RADIOCEPHALIC;  Surgeon: Matthew Earthlyodd F Early, MD;  [facility];  Service: Vascular;  Laterality: Left;    Family History  Problem Relation Age of Onset  . Hypertension Mother   . Stroke Mother   . Heart disease Mother     Heart Disease before age 39  . Hypertension Father   . Stroke Father   . Heart disease Father     Heart Disease before age 39  . Hypertension Brother   No FH renal disease  Social History:  reports that he has never smoked. He has never used smokeless tobacco. He reports that he does not drink alcohol or use illicit drugs. On disability.  Lives with his fiancee.  Allergies:  Allergies  Allergen Reactions  . Amoxicillin Hives     (Not in a hospital admission)   Lab Results: UA: ND   Recent Labs  02/11/14 0800  WBC 4.6  HGB 10.8*  HCT 31.2*  PLT 166   BMET  Recent Labs  02/11/14 0800  NA 137  K 3.5*  CL 99  CO2 16*  GLUCOSE 108*  BUN 81*  CREATININE 6.91*  CALCIUM 7.6*   LFT No results found for this basename: PROT, ALBUMIN, AST, ALT, ALKPHOS, BILITOT, BILIDIR, IBILI,  in the last 72  hours Dg Chest 2 View  02/11/2014   CLINICAL DATA:  Shortness of breath, weakness  EXAM: CHEST  2 VIEW  COMPARISON:  07/01/2013  FINDINGS: Marked cardiomegaly with increased vascular and interstitial prominence as well as small effusions compatible with background mild edema. Patchy peripheral areas of focal airspace disease/ consolidation in the right lower lobe and left midlung, nonspecific but could represent areas of atelectasis/collapse or superimposed pneumonia.  Trachea is midline.  No pneumothorax.  No acute osseous finding.  IMPRESSION: Cardiomegaly with increased vascular and interstitial prominence compatible with mild edema  Trace pleural effusions bilaterally  Right lower lobe and left mid lung peripheral areas of atelectasis versus consolidation.   Electronically Signed   By: Matthew Favorsrevor  Beard M.D.   On: 02/11/2014 08:26   Nm Pulmonary Perfusion  02/11/2014   CLINICAL DATA:  Shortness of breath  EXAM: NUCLEAR MEDICINE PERFUSION LUNG SCAN  TECHNIQUE: Perfusion images were obtained in multiple projections after intravenous injection of radiopharmaceutical.  RADIOPHARMACEUTICALS:  3mCi Tc-3774m MAA  COMPARISON:  None.  FINDINGS: There is adequate uptake throughout both lungs on perfusion imaging. Cardiomegaly is noted similar to that seen on recent chest x-ray. Small  defect is noted laterally in the left lung which corresponds to a density seen on the recent chest x-ray. No sizable defect to suggest segmental pulmonary embolism is noted.  IMPRESSION: Small defect laterally within the left lung which corresponds to a chest x-ray abnormality. No segmental defects are identified to suggest pulmonary embolism.   Electronically Signed   By: Matthew Beard M.D.   On: 02/11/2014 13:32    ROS: no change in vision + SOB but better with O2 No CP No abd pain No dysuria CO pain in toes Lt foot No neuropathic sxs  PHYSICAL EXAM: Blood pressure 142/100, pulse 71, temperature 97.6 F (36.4 C), temperature  source Oral, resp. rate 19, height 5\' 6"  (1.676 m), weight 72.576 kg (160 lb), SpO2 99.00%. HEENT: PERRLA EOMI NECK:+ JVD No bruits LUNGS:Basilar crackles L>R CARDIAC:RRR with S4 gallop, no rub ABD:+ BS NTND No HSM EXT:2-3+ edema  [name] + bruit and ready to use if needed Back 1-2+ presacral edema NEURO:CNI M&SI, OX#, no asterixis  Assessment: 1. CKD5 he denies any uremic Sxs at this time 2. Vol overload 3. Sec HPTH last PTH 95 4. Anemia on procrit 5. HTN PLAN: 1. He does not have any uremic sxs and if can be diuresed then maybe he can avoid HD for now.  He has an AVF ready to use if HD is needed. 2. Check PTH, PO4 and iron studies  3. Renal diet 4. IV lasix 160mg  tid 5. Resume procrit/aranesp 6. Daily Scr 7. May need PO bicarb if bicarb remains low   Matthew Beard 02/11/2014, 2:00 PM

## 2014-02-11 NOTE — H&P (Signed)
PCP:   [name], MD   Chief Complaint:   Shortness of breath  HPI: . 38 -year-old male who   has a past medical history of Hypertension; CKD (chronic kidney disease), stage IV; Normocytic anemia; Pericardial effusion; Cerebrovascular disease; NICM (nonischemic cardiomyopathy); CHF (congestive heart failure); Shortness of breath; and Headache(784.0). Today presented to the ED with chief complaint of him shortness of breath for past one week. Patient has history of CK D. stage IV and is followed by nephrology as outpatient. As per patient he has been taking Lasix as prescribed, but over the past one week he is noticed worsening shortness of breath which is worse when he is lying down, he denies any chest pain, no nausea vomiting or diarrhea. Patient has been walking and is not bedbound. Patient has history of nonischemic cardio myopathy, last EF was 20% by echocardiogram. In the ED, patient was given high-dose Lasix 120 mg IV x1, as per nephrology recommendation.  Allergies:   Allergies  Allergen Reactions  . Amoxicillin Hives      Past Medical History  Diagnosis Date  . Hypertension   . CKD (chronic kidney disease), stage IV   . Normocytic anemia   . Pericardial effusion     Remote hx  . Cerebrovascular disease     Chronic lacunar infarcts  . NICM (nonischemic cardiomyopathy)   . CHF (congestive heart failure)   . Shortness of breath   . RJJOACZY(606.3)     Past Surgical History  Procedure Laterality Date  . Av fistula placement Left 10/19/2013    Procedure: ARTERIOVENOUS (AV) FISTULA CREATION- LEFT RADIOCEPHALIC;  Surgeon: Rosetta Posner, MD;  Location: [facility];  Service: Vascular;  Laterality: Left;    Prior to Admission medications   Medication Sig Start Date End Date Taking? Authorizing Provider  amLODipine (NORVASC) 10 MG tablet Take 1 tablet (10 mg total) by mouth daily. 09/15/13  Yes Shanker Kristeen Mans, MD  carvedilol (COREG) 25 MG tablet Take 1 tablet (25 mg total)  by mouth 2 (two) times daily with a meal. 09/15/13  Yes Shanker Kristeen Mans, MD  cloNIDine (CATAPRES) 0.2 MG tablet Take 1 tablet (0.2 mg total) by mouth 3 (three) times daily. 09/15/13  Yes Shanker Kristeen Mans, MD  furosemide (LASIX) 80 MG tablet Take 1 tablet (80 mg total) by mouth daily. 09/15/13  Yes Shanker Kristeen Mans, MD  hydrALAZINE (APRESOLINE) 100 MG tablet Take 100 mg by mouth every 8 (eight) hours.   Yes Historical Provider, MD    Social History:  reports that he has never smoked. He has never used smokeless tobacco. He reports that he does not drink alcohol or use illicit drugs.  Family History  Problem Relation Age of Onset  . Hypertension Mother   . Stroke Mother   . Heart disease Mother     Heart Disease before age 76  . Hypertension Father   . Stroke Father   . Heart disease Father     Heart Disease before age 60  . Hypertension Brother      All the positives are listed in BOLD  Review of Systems:  HEENT: Headache, blurred vision, runny nose, sore throat Neck: Hypothyroidism, hyperthyroidism,,lymphadenopathy Chest : Shortness of breath, history of COPD, Asthma Heart : Chest pain, history of coronary arterey disease GI:  Nausea, vomiting, diarrhea, constipation, GERD GU: Dysuria, urgency, frequency of urination, hematuria Neuro: Stroke, seizures, syncope Psych: Depression, anxiety, hallucinations   Physical Exam: Blood pressure 123/83, pulse 73, temperature 98.2  F (36.8 C), temperature source Oral, resp. rate 35, height 5' 6"  (1.676 m), weight 72.576 kg (160 lb), SpO2 99.00%. Constitutional:   Patient is a well-developed and well-nourished male in no acute distress and cooperative with exam. Head: Normocephalic and atraumatic Mouth: Mucus membranes moist Eyes: PERRL, EOMI, conjunctivae normal Neck: Supple, No Thyromegaly Cardiovascular: RRR, S1 normal, S2 normal Pulmonary/Chest: CTAB, no wheezes, rales, or rhonchi Abdominal: Soft. Non-tender, non-distended,  bowel sounds are normal, no masses, organomegaly, or guarding present.  Neurological: A&O x3, Strenght is normal and symmetric bilaterally, cranial nerve II-XII are grossly intact, no focal motor deficit, sensory intact to light touch bilaterally.  Extremities : Bilateral 2+ pitting edema of the lower extremities   Labs on Admission:  Results for orders placed during the hospital encounter of 02/11/14 (from the past 48 hour(s))  CBC WITH DIFFERENTIAL     Status: Abnormal   Collection Time    02/11/14  8:00 AM      Result Value Ref Range   WBC 4.6  4.0 - 10.5 K/uL   RBC 3.81 (*) 4.22 - 5.81 MIL/uL   Hemoglobin 10.8 (*) 13.0 - 17.0 g/dL   HCT 31.2 (*) 39.0 - 52.0 %   MCV 81.9  78.0 - 100.0 fL   MCH 28.3  26.0 - 34.0 pg   MCHC 34.6  30.0 - 36.0 g/dL   RDW 17.6 (*) 11.5 - 15.5 %   Platelets 166  150 - 400 K/uL   Neutrophils Relative % 81 (*) 43 - 77 %   Neutro Abs 3.7  1.7 - 7.7 K/uL   Lymphocytes Relative 11 (*) 12 - 46 %   Lymphs Abs 0.5 (*) 0.7 - 4.0 K/uL   Monocytes Relative 7  3 - 12 %   Monocytes Absolute 0.3  0.1 - 1.0 K/uL   Eosinophils Relative 2  0 - 5 %   Eosinophils Absolute 0.1  0.0 - 0.7 K/uL   Basophils Relative 0  0 - 1 %   Basophils Absolute 0.0  0.0 - 0.1 K/uL  BASIC METABOLIC PANEL     Status: Abnormal   Collection Time    02/11/14  8:00 AM      Result Value Ref Range   Sodium 137  137 - 147 mEq/L   Potassium 3.5 (*) 3.7 - 5.3 mEq/L   Chloride 99  96 - 112 mEq/L   CO2 16 (*) 19 - 32 mEq/L   Glucose, Bld 108 (*) 70 - 99 mg/dL   BUN 81 (*) 6 - 23 mg/dL   Creatinine, Ser 6.91 (*) 0.50 - 1.35 mg/dL   Calcium 7.6 (*) 8.4 - 10.5 mg/dL   GFR calc non Af Amer 9 (*) >90 mL/min   GFR calc Af Amer 11 (*) >90 mL/min   Comment: (NOTE)     The eGFR has been calculated using the CKD EPI equation.     This calculation has not been validated in all clinical situations.     eGFR's persistently <90 mL/min signify possible Chronic Kidney     Disease.  TROPONIN I      Status: None   Collection Time    02/11/14  8:00 AM      Result Value Ref Range   Troponin I <0.30  <0.30 ng/mL   Comment:            Due to the release kinetics of cTnI,     a negative result within the first hours  of the onset of symptoms does not rule out     myocardial infarction with certainty.     If myocardial infarction is still suspected,     repeat the test at appropriate intervals.  PRO B NATRIURETIC PEPTIDE     Status: Abnormal   Collection Time    02/11/14  8:00 AM      Result Value Ref Range   Pro B Natriuretic peptide (BNP) >70000.0 (*) 0 - 125 pg/mL    Radiological Exams on Admission: Dg Chest 2 View  02/11/2014   CLINICAL DATA:  Shortness of breath, weakness  EXAM: CHEST  2 VIEW  COMPARISON:  07/01/2013  FINDINGS: Marked cardiomegaly with increased vascular and interstitial prominence as well as small effusions compatible with background mild edema. Patchy peripheral areas of focal airspace disease/ consolidation in the right lower lobe and left midlung, nonspecific but could represent areas of atelectasis/collapse or superimposed pneumonia.  Trachea is midline.  No pneumothorax.  No acute osseous finding.  IMPRESSION: Cardiomegaly with increased vascular and interstitial prominence compatible with mild edema  Trace pleural effusions bilaterally  Right lower lobe and left mid lung peripheral areas of atelectasis versus consolidation.   Electronically Signed   By: Daryll Brod M.D.   On: 02/11/2014 08:26    Assessment/Plan Active Problems:   Uncontrolled hypertension   Acute on chronic kidney disease, stage 4   Pulmonary edema   Dyspnea  Dyspnea Appears to be due to volume overload, though ventilation/perfusion scan has been ordered to rule out pulmonary embolism, continue with Lasix. Called and spoke to Dr. Moshe Cipro, nephrology who will see the patient today. Patient might require dialysis if no improvement with IV Lasix.  Hypertension Will continue the  patient's home medications including Catapres, hydralazine, Coreg. Will hold the Norvasc as this time as it can worsen the  Peripheral edema  DVT prophylaxis Heparin  Code status: Patient is full code  Family discussion: No family at bedside   Time Spent on Admission: 67 min*  [facility] Pager: [phone] 02/11/2014, 10:11 AM  If 7PM-7AM, please contact night-coverage  [email or web address]  Password [email or web address]

## 2014-02-11 NOTE — Progress Notes (Signed)
Pt admitted to [facility] from [facility] ED, received report from [place], [place]. Pt is alert and oriented to staff, call bell, and room. Bed in lowest position. Call bell within reach. Full assessment to Epic. Will continue to monitor.

## 2014-02-11 NOTE — ED Notes (Addendum)
For last week SOB BLE swelling and SOB. Triage RN noted that Pt ate a biscut prior to coming back to room. Pt has Hx of Fluid around heart in 2012. Pts cardiologist is [facility]m. Last seen Jan 2015. Pt significan other at bed side intially but left at the time of this note.

## 2014-02-12 DIAGNOSIS — I369 Nonrheumatic tricuspid valve disorder, unspecified: Secondary | ICD-10-CM

## 2014-02-12 LAB — COMPREHENSIVE METABOLIC PANEL
ALBUMIN: 2.2 g/dL — AB (ref 3.5–5.2)
ALT: 22 U/L (ref 0–53)
AST: 14 U/L (ref 0–37)
Alkaline Phosphatase: 67 U/L (ref 39–117)
BUN: 84 mg/dL — AB (ref 6–23)
CO2: 16 mEq/L — ABNORMAL LOW (ref 19–32)
Calcium: 7.5 mg/dL — ABNORMAL LOW (ref 8.4–10.5)
Chloride: 100 mEq/L (ref 96–112)
Creatinine, Ser: 7.1 mg/dL — ABNORMAL HIGH (ref 0.50–1.35)
GFR calc Af Amer: 10 mL/min — ABNORMAL LOW (ref >90)
GFR calc non Af Amer: 9 mL/min — ABNORMAL LOW (ref >90)
Glucose, Bld: 96 mg/dL (ref 70–99)
Potassium: 3.4 mEq/L — ABNORMAL LOW (ref 3.7–5.3)
SODIUM: 136 meq/L — AB (ref 137–147)
TOTAL PROTEIN: 6.7 g/dL (ref 6.0–8.3)

## 2014-02-12 LAB — CBC
HEMATOCRIT: 23.2 % — AB (ref 39.0–52.0)
HEMOGLOBIN: 8 g/dL — AB (ref 13.0–17.0)
MCH: 28.8 pg (ref 26.0–34.0)
MCHC: 34.5 g/dL (ref 30.0–36.0)
MCV: 83.5 fL (ref 78.0–100.0)
Platelets: 201 10*3/uL (ref 150–400)
RBC: 2.78 MIL/uL — ABNORMAL LOW (ref 4.22–5.81)
RDW: 18.1 % — AB (ref 11.5–15.5)
WBC: 5.7 10*3/uL (ref 4.0–10.5)

## 2014-02-12 LAB — FERRITIN: Ferritin: 334 ng/mL — ABNORMAL HIGH (ref 22–322)

## 2014-02-12 LAB — IRON AND TIBC
IRON: 28 ug/dL — AB (ref 42–135)
Saturation Ratios: 12 % — ABNORMAL LOW (ref 20–55)
TIBC: 240 ug/dL (ref 215–435)
UIBC: 212 ug/dL (ref 125–400)

## 2014-02-12 LAB — PHOSPHORUS: PHOSPHORUS: 6.5 mg/dL — AB (ref 2.3–4.6)

## 2014-02-12 LAB — TROPONIN I: Troponin I: <0.3 ng/mL (ref <0.30)

## 2014-02-12 MED ORDER — POTASSIUM CHLORIDE CRYS ER 20 MEQ PO TBCR
40.0000 meq | EXTENDED_RELEASE_TABLET | Freq: Once | ORAL | Status: AC
  Administered 2014-02-12: 40 meq via ORAL
  Filled 2014-02-12: qty 2

## 2014-02-12 MED ORDER — CALCIUM ACETATE 667 MG PO CAPS
1334.0000 mg | ORAL_CAPSULE | Freq: Three times a day (TID) | ORAL | Status: DC
  Administered 2014-02-12 – 2014-02-15 (×8): 1334 mg via ORAL
  Filled 2014-02-12 (×11): qty 2

## 2014-02-12 NOTE — Clinical Documentation Improvement (Signed)
Presents with volume overload, stage V CKD; history of CHF.   Pro BNP = > 70,000  Treating with IV Lasix 160mg  3x's  Daily   Please clarify the type and acuity of the patient's CHF if known and document findings in next progress note and discharge summary.   Acute, chronic, acute on chronic  Systolic, Diastolic, Systolic and Diastolic     Thank You, Shellee Milo ,RN Clinical Documentation Specialist:  [phone]  [facility]- Health Information Management

## 2014-02-12 NOTE — Progress Notes (Signed)
  Echocardiogram 2D Echocardiogram has been performed.  Matthew Beard 02/12/2014, 11:54 AM

## 2014-02-12 NOTE — Progress Notes (Signed)
Subjective:  No new complaints- seems a little slow ?baseline- confused on how to get up on side of bed to eat lunch- 1500 of UOP recorded but no foley and bed is wet so I suspect he has had more  Objective Vital signs in last 24 hours: Filed Vitals:   02/11/14 2025 02/12/14 0538 02/12/14 0806 02/12/14 1154  BP: 130/80 139/93 148/102 126/89  Pulse: 66 71 70 70  Temp: 97.6 F (36.4 C) 97.6 F (36.4 C) 97.8 F (36.6 C) 98.1 F (36.7 C)  TempSrc: Oral Oral Oral Oral  Resp: 24 20 20 20   Height:      Weight: 78.744 kg (173 lb 9.6 oz)     SpO2: 100% 99% 96% 99%   Weight change: 6.169 kg (13 lb 9.6 oz)  Intake/Output Summary (Last 24 hours) at 02/12/14 1248 Last data filed at 02/12/14 1030  Gross per 24 hour  Intake   1180 ml  Output   1500 ml  Net   -320 ml    Assessment/ Plan: Pt is a 38 y.o. yo male who was admitted on 02/11/2014 with volume overload  Assessment/Plan: 1. Renal- baseline stage 5 CKD with AVF in place- Is not having uremic symptoms to my read but albumin is 2.2- was 3.6 last month?  2. Volume overload- is major issue - still has significant edema so would like to continue IV diuresis for next 24 hours and watch kidney function as well as how quickly he rebounds 3. Anemia- was on ESA as OP , ordered here 4. Secondary hyperparathyroidism- phos 6.5- pth pending- will add phoslo   Alajah Witman A    Labs: Basic Metabolic Panel:  Recent Labs Lab 02/11/14 0800 02/11/14 1640 02/12/14 0532  NA 137  --  136*  K 3.5*  --  3.4*  CL 99  --  100  CO2 16*  --  16*  GLUCOSE 108*  --  96  BUN 81*  --  84*  CREATININE 6.91*  --  7.10*  CALCIUM 7.6*  --  7.5*  PHOS  --  6.5*  --    Liver Function Tests:  Recent Labs Lab 02/12/14 0532  AST 14  ALT 22  ALKPHOS 67  BILITOT <0.2*  PROT 6.7  ALBUMIN 2.2*   No results found for this basename: LIPASE, AMYLASE,  in the last 168 hours No results found for this basename: AMMONIA,  in the last 168  hours CBC:  Recent Labs Lab 02/11/14 0800 02/12/14 0532  WBC 4.6 5.7  NEUTROABS 3.7  --   HGB 10.8* 8.0*  HCT 31.2* 23.2*  MCV 81.9 83.5  PLT 166 201   Cardiac Enzymes:  Recent Labs Lab 02/11/14 0800 02/11/14 1640 02/11/14 2214 02/12/14 0532  TROPONINI <0.30 <0.30 <0.30 <0.30   CBG: No results found for this basename: GLUCAP,  in the last 168 hours  Iron Studies:  Recent Labs  02/11/14 1640  IRON 28*  TIBC 240  FERRITIN 334*   Studies/Results: Dg Chest 2 View  02/11/2014   CLINICAL DATA:  Shortness of breath, weakness  EXAM: CHEST  2 VIEW  COMPARISON:  07/01/2013  FINDINGS: Marked cardiomegaly with increased vascular and interstitial prominence as well as small effusions compatible with background mild edema. Patchy peripheral areas of focal airspace disease/ consolidation in the right lower lobe and left midlung, nonspecific but could represent areas of atelectasis/collapse or superimposed pneumonia.  Trachea is midline.  No pneumothorax.  No acute osseous finding.  IMPRESSION:  Cardiomegaly with increased vascular and interstitial prominence compatible with mild edema  Trace pleural effusions bilaterally  Right lower lobe and left mid lung peripheral areas of atelectasis versus consolidation.   Electronically Signed   By: Ruel Favorsrevor  Shick M.D.   On: 02/11/2014 08:26   Nm Pulmonary Perfusion  02/11/2014   CLINICAL DATA:  Shortness of breath  EXAM: NUCLEAR MEDICINE PERFUSION LUNG SCAN  TECHNIQUE: Perfusion images were obtained in multiple projections after intravenous injection of radiopharmaceutical.  RADIOPHARMACEUTICALS:  3mCi Tc-4744m MAA  COMPARISON:  None.  FINDINGS: There is adequate uptake throughout both lungs on perfusion imaging. Cardiomegaly is noted similar to that seen on recent chest x-ray. Small defect is noted laterally in the left lung which corresponds to a density seen on the recent chest x-ray. No sizable defect to suggest segmental pulmonary embolism is noted.   IMPRESSION: Small defect laterally within the left lung which corresponds to a chest x-ray abnormality. No segmental defects are identified to suggest pulmonary embolism.   Electronically Signed   By: Alcide CleverMark  Lukens M.D.   On: 02/11/2014 13:32   Medications: Infusions:    Scheduled Medications: . carvedilol  25 mg Oral BID WC  . cloNIDine  0.2 mg Oral TID  . darbepoetin (ARANESP) injection - NON-DIALYSIS  100 mcg Subcutaneous Q Thu-1800  . furosemide  160 mg Intravenous TID  . heparin  5,000 Units Subcutaneous 3 times per day  . hydrALAZINE  100 mg Oral 3 times per day  . sodium chloride  3 mL Intravenous Q12H    have reviewed scheduled and prn medications.  Physical Exam: General: a little slow ?baseline Heart: RRR Lungs: mosty clear Abdomen: soft, non tender Extremities: 2+ pitting edema Dialysis Access: left AVF-- patent    02/12/2014,12:48 PM  LOS: 1 day

## 2014-02-12 NOTE — Progress Notes (Signed)
[facility] PROGRESS NOTE  Matthew Beard:[id number] DOB: February 25, 1976 DOA: 02/11/2014 PCP: [name], MD  Assessment/Plan: 1. Dyspnea- Secondary to pulmonary edema, improved with IV lasix. Continue with high dose lasix 160 mg IV q 8hr. 2. Acute on CKD- Stage 5 at [place], renal following. 3.   Code Status: *Full code Family Communication: *Discussed with patient Disposition Plan: Home when stable   Consultants:  Nephrology  Procedures:  None  Antibiotics:  None  HPI/Subjective: Patient seen and examined, had good urine output yesterday, breathing has significantly improved,.  Objective: Filed Vitals:   02/12/14 1154  BP: 126/89  Pulse: 70  Temp: 98.1 F (36.7 C)  Resp: 20    Intake/Output Summary (Last 24 hours) at 02/12/14 1434 Last data filed at 02/12/14 1030  Gross per 24 hour  Intake   1180 ml  Output   1500 ml  Net   -320 ml   Filed Weights   02/11/14 0655 02/11/14 2025  Weight: 72.576 kg (160 lb) 78.744 kg (173 lb 9.6 oz)    Exam:  Physical Exam: Head: Normocephalic, atraumatic.  Eyes: No signs of jaundice, EOMI Nose: Mucous membranes dry.  Throat: Oropharynx nonerythematous, no exudate appreciated.  Neck: supple,No deformities, masses, or tenderness noted. Lungs: Normal respiratory effort. B/L Clear to auscultation, no crackles or wheezes.  Heart: Regular RR. S1 and S2 normal  Abdomen: BS normoactive. Soft, Nondistended, non-tender.  Extremities: B/L 1+ edema   Data Reviewed: Basic Metabolic Panel:  Recent Labs Lab 02/11/14 0800 02/11/14 1640 02/12/14 0532  NA 137  --  136*  K 3.5*  --  3.4*  CL 99  --  100  CO2 16*  --  16*  GLUCOSE 108*  --  96  BUN 81*  --  84*  CREATININE 6.91*  --  7.10*  CALCIUM 7.6*  --  7.5*  PHOS  --  6.5*  --    Liver Function Tests:  Recent Labs Lab 02/12/14 0532  AST 14  ALT 22  ALKPHOS 67  BILITOT <0.2*  PROT 6.7  ALBUMIN 2.2*   No results found for this basename: LIPASE,  AMYLASE,  in the last 168 hours No results found for this basename: AMMONIA,  in the last 168 hours CBC:  Recent Labs Lab 02/11/14 0800 02/12/14 0532  WBC 4.6 5.7  NEUTROABS 3.7  --   HGB 10.8* 8.0*  HCT 31.2* 23.2*  MCV 81.9 83.5  PLT 166 201   Cardiac Enzymes:  Recent Labs Lab 02/11/14 0800 02/11/14 1640 02/11/14 2214 02/12/14 0532  TROPONINI <0.30 <0.30 <0.30 <0.30   BNP (last 3 results)  Recent Labs  06/29/13 0345 07/04/13 0640 02/11/14 0800  PROBNP 31751.0* 6732.0* >70000.0*   CBG: No results found for this basename: GLUCAP,  in the last 168 hours  No results found for this or any previous visit (from the past 240 hour(Beard)).   Studies: Dg Chest 2 View  02/11/2014   CLINICAL DATA:  Shortness of breath, weakness  EXAM: CHEST  2 VIEW  COMPARISON:  07/01/2013  FINDINGS: Marked cardiomegaly with increased vascular and interstitial prominence as well as small effusions compatible with background mild edema. Patchy peripheral areas of focal airspace disease/ consolidation in the right lower lobe and left midlung, nonspecific but could represent areas of atelectasis/collapse or superimposed pneumonia.  Trachea is midline.  No pneumothorax.  No acute osseous finding.  IMPRESSION: Cardiomegaly with increased vascular and interstitial prominence compatible with mild edema  Trace pleural effusions  bilaterally  Right lower lobe and left mid lung peripheral areas of atelectasis versus consolidation.   Electronically Signed   By: Ruel Favorsrevor  Shick M.D.   On: 02/11/2014 08:26   Nm Pulmonary Perfusion  02/11/2014   CLINICAL DATA:  Shortness of breath  EXAM: NUCLEAR MEDICINE PERFUSION LUNG SCAN  TECHNIQUE: Perfusion images were obtained in multiple projections after intravenous injection of radiopharmaceutical.  RADIOPHARMACEUTICALS:  3mCi Tc-6477m MAA  COMPARISON:  None.  FINDINGS: There is adequate uptake throughout both lungs on perfusion imaging. Cardiomegaly is noted similar to that seen  on recent chest x-ray. Small defect is noted laterally in the left lung which corresponds to a density seen on the recent chest x-ray. No sizable defect to suggest segmental pulmonary embolism is noted.  IMPRESSION: Small defect laterally within the left lung which corresponds to a chest x-ray abnormality. No segmental defects are identified to suggest pulmonary embolism.   Electronically Signed   By: Alcide CleverMark  Lukens M.D.   On: 02/11/2014 13:32    Scheduled Meds: . calcium acetate  1,334 mg Oral TID WC  . carvedilol  25 mg Oral BID WC  . cloNIDine  0.2 mg Oral TID  . darbepoetin (ARANESP) injection - NON-DIALYSIS  100 mcg Subcutaneous Q Thu-1800  . furosemide  160 mg Intravenous TID  . heparin  5,000 Units Subcutaneous 3 times per day  . hydrALAZINE  100 mg Oral 3 times per day  . potassium chloride  40 mEq Oral Once  . sodium chloride  3 mL Intravenous Q12H   Continuous Infusions:   Active Problems:   Uncontrolled hypertension   Acute on chronic kidney disease, stage 4   Pulmonary edema   Dyspnea    Time spent: 25 min    [facility],Matthew Beard  [facility] Pager [phone]. If 7PM-7AM, please contact night-coverage at [email or web address], password [email or web address] 02/12/2014, 2:34 PM  LOS: 1 day

## 2014-02-13 LAB — RENAL FUNCTION PANEL
Albumin: 2.2 g/dL — ABNORMAL LOW (ref 3.5–5.2)
BUN: 86 mg/dL — AB (ref 6–23)
CALCIUM: 7.4 mg/dL — AB (ref 8.4–10.5)
CHLORIDE: 98 meq/L (ref 96–112)
CO2: 17 meq/L — AB (ref 19–32)
CREATININE: 7.11 mg/dL — AB (ref 0.50–1.35)
GFR calc Af Amer: 10 mL/min — ABNORMAL LOW (ref >90)
GFR calc non Af Amer: 9 mL/min — ABNORMAL LOW (ref >90)
GLUCOSE: 90 mg/dL (ref 70–99)
Phosphorus: 5.8 mg/dL — ABNORMAL HIGH (ref 2.3–4.6)
Potassium: 3.2 mEq/L — ABNORMAL LOW (ref 3.7–5.3)
Sodium: 136 mEq/L — ABNORMAL LOW (ref 137–147)

## 2014-02-13 MED ORDER — POTASSIUM CHLORIDE CRYS ER 20 MEQ PO TBCR
40.0000 meq | EXTENDED_RELEASE_TABLET | Freq: Every day | ORAL | Status: DC
  Administered 2014-02-13 – 2014-02-14 (×2): 40 meq via ORAL
  Filled 2014-02-13 (×3): qty 2

## 2014-02-13 MED ORDER — POTASSIUM CHLORIDE 10 MEQ/100ML IV SOLN
10.0000 meq | INTRAVENOUS | Status: DC
  Administered 2014-02-13: 10 meq via INTRAVENOUS
  Filled 2014-02-13 (×3): qty 100

## 2014-02-13 NOTE — Progress Notes (Signed)
[facility] PROGRESS NOTE  Carmell AustriaDamon N Eidem JXB:[id number] DOB: June 13, 1976 DOA: 02/11/2014 PCP: [name],[name], MD  Assessment/Plan: 1. Dyspnea- Secondary to pulmonary edema, improved with IV lasix. Continue with high dose lasix 160 mg IV q 8hr. 2. Acute on CKD- Stage 5 at [place], renal following. 3. Hypokalemia- Will replace potassium. Check BMP in am.  Code Status: *Full code Family Communication: *Discussed with patient Disposition Plan: Home when stable   Consultants:  Nephrology  Procedures:  None  Antibiotics:  None  HPI/Subjective: Patient seen and examined, had good urine output yesterday, breathing has significantly improved,.  Objective: Filed Vitals:   02/13/14 0900  BP: 153/92  Pulse: 69  Temp: 97.8 F (36.6 C)  Resp: 21    Intake/Output Summary (Last 24 hours) at 02/13/14 1259 Last data filed at 02/13/14 1039  Gross per 24 hour  Intake   1822 ml  Output   1950 ml  Net   -128 ml   Filed Weights   02/11/14 0655 02/11/14 2025 02/12/14 2239  Weight: 72.576 kg (160 lb) 78.744 kg (173 lb 9.6 oz) 79.5 kg (175 lb 4.3 oz)    Exam:  Physical Exam: Head: Normocephalic, atraumatic.  Eyes: No signs of jaundice, EOMI Nose: Mucous membranes dry.  Throat: Oropharynx nonerythematous, no exudate appreciated.  Neck: supple,No deformities, masses, or tenderness noted. Lungs: Normal respiratory effort. B/L Clear to auscultation, no crackles or wheezes.  Heart: Regular RR. S1 and S2 normal  Abdomen: BS normoactive. Soft, Nondistended, non-tender.  Extremities: B/L 1+ edema   Data Reviewed: Basic Metabolic Panel:  Recent Labs Lab 02/11/14 0800 02/11/14 1640 02/12/14 0532 02/13/14 0543  NA 137  --  136* 136*  K 3.5*  --  3.4* 3.2*  CL 99  --  100 98  CO2 16*  --  16* 17*  GLUCOSE 108*  --  96 90  BUN 81*  --  84* 86*  CREATININE 6.91*  --  7.10* 7.11*  CALCIUM 7.6*  --  7.5* 7.4*  PHOS  --  6.5*  --  5.8*   Liver Function Tests:  Recent  Labs Lab 02/12/14 0532 02/13/14 0543  AST 14  --   ALT 22  --   ALKPHOS 67  --   BILITOT <0.2*  --   PROT 6.7  --   ALBUMIN 2.2* 2.2*   No results found for this basename: LIPASE, AMYLASE,  in the last 168 hours No results found for this basename: AMMONIA,  in the last 168 hours CBC:  Recent Labs Lab 02/11/14 0800 02/12/14 0532  WBC 4.6 5.7  NEUTROABS 3.7  --   HGB 10.8* 8.0*  HCT 31.2* 23.2*  MCV 81.9 83.5  PLT 166 201   Cardiac Enzymes:  Recent Labs Lab 02/11/14 0800 02/11/14 1640 02/11/14 2214 02/12/14 0532  TROPONINI <0.30 <0.30 <0.30 <0.30   BNP (last 3 results)  Recent Labs  06/29/13 0345 07/04/13 0640 02/11/14 0800  PROBNP 31751.0* 6732.0* >70000.0*   CBG: No results found for this basename: GLUCAP,  in the last 168 hours  No results found for this or any previous visit (from the past 240 hour(s)).   Studies: Nm Pulmonary Perfusion  02/11/2014   CLINICAL DATA:  Shortness of breath  EXAM: NUCLEAR MEDICINE PERFUSION LUNG SCAN  TECHNIQUE: Perfusion images were obtained in multiple projections after intravenous injection of radiopharmaceutical.  RADIOPHARMACEUTICALS:  3mCi Tc-5854m MAA  COMPARISON:  None.  FINDINGS: There is adequate uptake throughout both lungs on perfusion imaging.  Cardiomegaly is noted similar to that seen on recent chest x-ray. Small defect is noted laterally in the left lung which corresponds to a density seen on the recent chest x-ray. No sizable defect to suggest segmental pulmonary embolism is noted.  IMPRESSION: Small defect laterally within the left lung which corresponds to a chest x-ray abnormality. No segmental defects are identified to suggest pulmonary embolism.   Electronically Signed   By: Alcide Clever M.D.   On: 02/11/2014 13:32    Scheduled Meds: . calcium acetate  1,334 mg Oral TID WC  . carvedilol  25 mg Oral BID WC  . cloNIDine  0.2 mg Oral TID  . darbepoetin (ARANESP) injection - NON-DIALYSIS  100 mcg Subcutaneous Q  Thu-1800  . furosemide  160 mg Intravenous TID  . heparin  5,000 Units Subcutaneous 3 times per day  . hydrALAZINE  100 mg Oral 3 times per day  . potassium chloride  40 mEq Oral Daily  . sodium chloride  3 mL Intravenous Q12H   Continuous Infusions:   Active Problems:   Uncontrolled hypertension   Acute on chronic kidney disease, stage 4   Pulmonary edema   Dyspnea    Time spent: 25 min    [facility]  [facility] Pager [phone]. If 7PM-7AM, please contact night-coverage at [email or web address], password [facility] 02/13/2014, 12:59 PM  LOS: 2 days

## 2014-02-13 NOTE — Progress Notes (Signed)
Subjective:  No new complaints- did confirm that slow is his baseline- had over 2 liters of urine recorded- not sure all of it  but also got over  2 liters in ? And has managed to gain weight ? yest but no weight yet today Objective Vital signs in last 24 hours: Filed Vitals:   02/12/14 1638 02/12/14 2239 02/13/14 0400 02/13/14 0900  BP: 141/92 136/85 133/94 153/92  Pulse: 72 66 66 69  Temp: 97.4 F (36.3 C) 97.3 F (36.3 C) 97.7 F (36.5 C) 97.8 F (36.6 C)  TempSrc: Oral Oral Oral Oral  Resp: 20 20 20 21   Height:  5\' 6"  (1.676 m)    Weight:  79.5 kg (175 lb 4.3 oz)    SpO2: 98% 95% 99% 98%   Weight change: 0.756 kg (1 lb 10.7 oz)  Intake/Output Summary (Last 24 hours) at 02/13/14 1045 Last data filed at 02/13/14 1039  Gross per 24 hour  Intake   1822 ml  Output   1950 ml  Net   -128 ml    Assessment/ Plan: Pt is a 38 y.o. yo male who was admitted on 02/11/2014 with volume overload  Assessment/Plan: 1. Renal- baseline stage 5 CKD with AVF in place- Is not having uremic symptoms to my read but albumin is 2.2- was 3.6 last month?  2. Volume overload- is major issue - still has significant edema so would like to again continue IV diuresis for next 24 hours told him to limit fluid intake and watch kidney function as well as how quickly he rebounds 3. Anemia- was on ESA as OP , ordered here 4. Secondary hyperparathyroidism- phos 6.5- pth pending- is on phoslo 5. Hypokalemia- agree with repletion but oral may be better as we are trying to limit fluid intake   Daneisha Surges A    Labs: Basic Metabolic Panel:  Recent Labs Lab 02/11/14 0800 02/11/14 1640 02/12/14 0532 02/13/14 0543  NA 137  --  136* 136*  K 3.5*  --  3.4* 3.2*  CL 99  --  100 98  CO2 16*  --  16* 17*  GLUCOSE 108*  --  96 90  BUN 81*  --  84* 86*  CREATININE 6.91*  --  7.10* 7.11*  CALCIUM 7.6*  --  7.5* 7.4*  PHOS  --  6.5*  --  5.8*   Liver Function Tests:  Recent Labs Lab 02/12/14 0532  02/13/14 0543  AST 14  --   ALT 22  --   ALKPHOS 67  --   BILITOT <0.2*  --   PROT 6.7  --   ALBUMIN 2.2* 2.2*   No results found for this basename: LIPASE, AMYLASE,  in the last 168 hours No results found for this basename: AMMONIA,  in the last 168 hours CBC:  Recent Labs Lab 02/11/14 0800 02/12/14 0532  WBC 4.6 5.7  NEUTROABS 3.7  --   HGB 10.8* 8.0*  HCT 31.2* 23.2*  MCV 81.9 83.5  PLT 166 201   Cardiac Enzymes:  Recent Labs Lab 02/11/14 0800 02/11/14 1640 02/11/14 2214 02/12/14 0532  TROPONINI <0.30 <0.30 <0.30 <0.30   CBG: No results found for this basename: GLUCAP,  in the last 168 hours  Iron Studies:   Recent Labs  02/11/14 1640  IRON 28*  TIBC 240  FERRITIN 334*   Studies/Results: Nm Pulmonary Perfusion  02/11/2014   CLINICAL DATA:  Shortness of breath  EXAM: NUCLEAR MEDICINE PERFUSION LUNG SCAN  TECHNIQUE:  Perfusion images were obtained in multiple projections after intravenous injection of radiopharmaceutical.  RADIOPHARMACEUTICALS:  Tc-41m MAA  COMPARISON:  None.  FINDINGS: There is adequate uptake throughout both lungs on perfusion imaging. Cardiomegaly is noted similar to that seen on recent chest x-ray. Small defect is noted laterally in the left lung which corresponds to a density seen on the recent chest x-ray. No sizable defect to suggest segmental pulmonary embolism is noted.  IMPRESSION: Small defect laterally within the left lung which corresponds to a chest x-ray abnormality. No segmental defects are identified to suggest pulmonary embolism.   Electronically Signed   By: Alcide Clever M.D.   On: 02/11/2014 13:32   Medications: Infusions:    Scheduled Medications: . calcium acetate  1,334 mg Oral TID WC  . carvedilol  25 mg Oral BID WC  . cloNIDine  0.2 mg Oral TID  . darbepoetin (ARANESP) injection - NON-DIALYSIS  100 mcg Subcutaneous Q Thu-1800  . furosemide  160 mg Intravenous TID  . heparin  5,000 Units Subcutaneous 3 times per  day  . hydrALAZINE  100 mg Oral 3 times per day  . potassium chloride  10 mEq Intravenous Q1 Hr x 3  . sodium chloride  3 mL Intravenous Q12H    have reviewed scheduled and prn medications.  Physical Exam: General: a little slow mentally Heart: RRR Lungs: mosty clear Abdomen: soft, non tender Extremities: 2- 3+ pitting edema Dialysis Access: left AVF-- patent    02/13/2014,10:45 AM  LOS: 2 days

## 2014-02-14 DIAGNOSIS — J811 Chronic pulmonary edema: Secondary | ICD-10-CM

## 2014-02-14 DIAGNOSIS — I10 Essential (primary) hypertension: Secondary | ICD-10-CM

## 2014-02-14 DIAGNOSIS — N179 Acute kidney failure, unspecified: Secondary | ICD-10-CM

## 2014-02-14 LAB — RENAL FUNCTION PANEL
Albumin: 2.3 g/dL — ABNORMAL LOW (ref 3.5–5.2)
BUN: 88 mg/dL — AB (ref 6–23)
CHLORIDE: 95 meq/L — AB (ref 96–112)
CO2: 18 meq/L — AB (ref 19–32)
CREATININE: 7.19 mg/dL — AB (ref 0.50–1.35)
Calcium: 7.5 mg/dL — ABNORMAL LOW (ref 8.4–10.5)
GFR calc Af Amer: 10 mL/min — ABNORMAL LOW (ref >90)
GFR calc non Af Amer: 9 mL/min — ABNORMAL LOW (ref >90)
Glucose, Bld: 93 mg/dL (ref 70–99)
POTASSIUM: 3.2 meq/L — AB (ref 3.7–5.3)
Phosphorus: 5.5 mg/dL — ABNORMAL HIGH (ref 2.3–4.6)
Sodium: 134 mEq/L — ABNORMAL LOW (ref 137–147)

## 2014-02-14 MED ORDER — METOLAZONE 5 MG PO TABS
5.0000 mg | ORAL_TABLET | Freq: Every day | ORAL | Status: DC
  Administered 2014-02-14 – 2014-02-15 (×2): 5 mg via ORAL
  Filled 2014-02-14 (×2): qty 1

## 2014-02-14 MED ORDER — FUROSEMIDE 80 MG PO TABS
160.0000 mg | ORAL_TABLET | Freq: Two times a day (BID) | ORAL | Status: DC
  Administered 2014-02-14 – 2014-02-15 (×2): 160 mg via ORAL
  Filled 2014-02-14 (×4): qty 2

## 2014-02-14 MED ORDER — POTASSIUM CHLORIDE CRYS ER 20 MEQ PO TBCR
40.0000 meq | EXTENDED_RELEASE_TABLET | ORAL | Status: AC
  Administered 2014-02-14 (×2): 40 meq via ORAL
  Filled 2014-02-14: qty 2

## 2014-02-14 NOTE — Progress Notes (Addendum)
[facility] PROGRESS NOTE  Carmell AustriaDamon N Upadhyay ZOX:[id number] DOB: [birth year] DOA: 02/11/2014 PCP: [name],[name], MD  Assessment/Plan: 1. Dyspnea- Secondary to pulmonary edema, improved with IV lasix. Continue with high dose lasix 160 mg IV q 8hr. 2. Acute on CKD- Stage 5 at [place], renal following. 3. Hypokalemia- Will replace potassium. Check BMP in am. 4. RUQ pain- Will obtain abdominal ultrasound. LFT's are normal.  Code Status: *Full code Family Communication: *Discussed with patient Disposition Plan: Home when stable   Consultants:  Nephrology  Procedures:  None  Antibiotics:  None  HPI/Subjective: Patient seen and examined, had good urine output yesterday, breathing has significantly improved.  Objective: Filed Vitals:   02/14/14 0900  BP: 131/79  Pulse: 69  Temp: 97.9 F (36.6 C)  Resp: 19    Intake/Output Summary (Last 24 hours) at 02/14/14 1122 Last data filed at 02/14/14 1100  Gross per 24 hour  Intake   1623 ml  Output    600 ml  Net   1023 ml   Filed Weights   02/11/14 2025 02/12/14 2239 02/14/14 1100  Weight: 78.744 kg (173 lb 9.6 oz) 79.5 kg (175 lb 4.3 oz) 77.565 kg (171 lb)    Exam:  Physical Exam: Head: Normocephalic, atraumatic.  Eyes: No signs of jaundice, EOMI Nose: Mucous membranes dry.  Throat: Oropharynx nonerythematous, no exudate appreciated.  Neck: supple,No deformities, masses, or tenderness noted. Lungs: Normal respiratory effort. B/L Clear to auscultation, no crackles or wheezes.  Heart: Regular RR. S1 and S2 normal  Abdomen: BS normoactive. Soft, Nondistended, non-tender. Positive guarding in the RUQ Extremities: B/L 1+ edema   Data Reviewed: Basic Metabolic Panel:  Recent Labs Lab 02/11/14 0800 02/11/14 1640 02/12/14 0532 02/13/14 0543 02/14/14 0600  NA 137  --  136* 136* 134*  K 3.5*  --  3.4* 3.2* 3.2*  CL 99  --  100 98 95*  CO2 16*  --  16* 17* 18*  GLUCOSE 108*  --  96 90 93  BUN 81*  --  84* 86*  88*  CREATININE 6.91*  --  7.10* 7.11* 7.19*  CALCIUM 7.6*  --  7.5* 7.4* 7.5*  PHOS  --  6.5*  --  5.8* 5.5*   Liver Function Tests:  Recent Labs Lab 02/12/14 0532 02/13/14 0543 02/14/14 0600  AST 14  --   --   ALT 22  --   --   ALKPHOS 67  --   --   BILITOT <0.2*  --   --   PROT 6.7  --   --   ALBUMIN 2.2* 2.2* 2.3*   No results found for this basename: LIPASE, AMYLASE,  in the last 168 hours No results found for this basename: AMMONIA,  in the last 168 hours CBC:  Recent Labs Lab 02/11/14 0800 02/12/14 0532  WBC 4.6 5.7  NEUTROABS 3.7  --   HGB 10.8* 8.0*  HCT 31.2* 23.2*  MCV 81.9 83.5  PLT 166 201   Cardiac Enzymes:  Recent Labs Lab 02/11/14 0800 02/11/14 1640 02/11/14 2214 02/12/14 0532  TROPONINI <0.30 <0.30 <0.30 <0.30   BNP (last 3 results)  Recent Labs  06/29/13 0345 07/04/13 0640 02/11/14 0800  PROBNP 31751.0* 6732.0* >70000.0*   CBG: No results found for this basename: GLUCAP,  in the last 168 hours  No results found for this or any previous visit (from the past 240 hour(s)).   Studies: No results found.  Scheduled Meds: . calcium acetate  1,334 mg  Oral TID WC  . carvedilol  25 mg Oral BID WC  . cloNIDine  0.2 mg Oral TID  . darbepoetin (ARANESP) injection - NON-DIALYSIS  100 mcg Subcutaneous Q Thu-1800  . furosemide  160 mg Intravenous TID  . heparin  5,000 Units Subcutaneous 3 times per day  . hydrALAZINE  100 mg Oral 3 times per day  . potassium chloride  40 mEq Oral Q4H  . sodium chloride  3 mL Intravenous Q12H   Continuous Infusions:   Active Problems:   Uncontrolled hypertension   Acute on chronic kidney disease, stage 4   Pulmonary edema   Dyspnea    Time spent: 25 min    [facility]  [facility] Pager [phone]. If 7PM-7AM, please contact night-coverage at [email or web address], password [facility] 02/14/2014, 11:22 AM  LOS: 3 days

## 2014-02-14 NOTE — Progress Notes (Signed)
Subjective:  No new complaints- did confirm that slow is his baseline- UOP remains a mystery- weight did go down some but still with significant edema- he is not otherwise uremic- wants to go home  Objective Vital signs in last 24 hours: Filed Vitals:   02/13/14 2344 02/14/14 0500 02/14/14 0900 02/14/14 1100  BP: 155/88 140/100 131/79   Pulse:  65 69   Temp:  97.7 F (36.5 C) 97.9 F (36.6 C)   TempSrc:  Oral Oral   Resp:  18 19   Height:      Weight:    77.565 kg (171 lb)  SpO2:  99% 99%    Weight change:   Intake/Output Summary (Last 24 hours) at 02/14/14 1144 Last data filed at 02/14/14 1100  Gross per 24 hour  Intake   1623 ml  Output    600 ml  Net   1023 ml    Assessment/ Plan: Pt is a 38 y.o. yo male who was admitted on 02/11/2014 with volume overload  Assessment/Plan: 1. Renal- baseline stage 5 CKD with AVF in place- Is not having uremic symptoms to my read but albumin is 2.2- was 3.6 last month? Could be the issue 2. Volume overload- is major issue - still has significant edema- not sure we have made great strides.  wants to go home- K was low today.   I would like to put him on an OP regimen to make sure will be appropriate for him (lasix 160 BID and zarox 5)  and replete K,   told him to limit fluid intake. If we haven't lost ground tomorrow, I think OK for discharge  to follow up with Dr. Hyman Hopes 3. Anemia- was on ESA as OP , ordered here 4. Secondary hyperparathyroidism- phos 6.5- pth pending- is on phoslo 5. Hypokalemia- agree with repletion    Dawnetta Copenhaver A    Labs: Basic Metabolic Panel:  Recent Labs Lab 02/11/14 1640 02/12/14 0532 02/13/14 0543 02/14/14 0600  NA  --  136* 136* 134*  K  --  3.4* 3.2* 3.2*  CL  --  100 98 95*  CO2  --  16* 17* 18*  GLUCOSE  --  96 90 93  BUN  --  84* 86* 88*  CREATININE  --  7.10* 7.11* 7.19*  CALCIUM  --  7.5* 7.4* 7.5*  PHOS 6.5*  --  5.8* 5.5*   Liver Function Tests:  Recent Labs Lab 02/12/14 0532  02/13/14 0543 02/14/14 0600  AST 14  --   --   ALT 22  --   --   ALKPHOS 67  --   --   BILITOT <0.2*  --   --   PROT 6.7  --   --   ALBUMIN 2.2* 2.2* 2.3*   No results found for this basename: LIPASE, AMYLASE,  in the last 168 hours No results found for this basename: AMMONIA,  in the last 168 hours CBC:  Recent Labs Lab 02/11/14 0800 02/12/14 0532  WBC 4.6 5.7  NEUTROABS 3.7  --   HGB 10.8* 8.0*  HCT 31.2* 23.2*  MCV 81.9 83.5  PLT 166 201   Cardiac Enzymes:  Recent Labs Lab 02/11/14 0800 02/11/14 1640 02/11/14 2214 02/12/14 0532  TROPONINI <0.30 <0.30 <0.30 <0.30   CBG: No results found for this basename: GLUCAP,  in the last 168 hours  Iron Studies:   Recent Labs  02/11/14 1640  IRON 28*  TIBC 240  FERRITIN 334*  Studies/Results: No results found. Medications: Infusions:    Scheduled Medications: . calcium acetate  1,334 mg Oral TID WC  . carvedilol  25 mg Oral BID WC  . cloNIDine  0.2 mg Oral TID  . darbepoetin (ARANESP) injection - NON-DIALYSIS  100 mcg Subcutaneous Q Thu-1800  . furosemide  160 mg Intravenous TID  . heparin  5,000 Units Subcutaneous 3 times per day  . hydrALAZINE  100 mg Oral 3 times per day  . potassium chloride  40 mEq Oral Q4H  . sodium chloride  3 mL Intravenous Q12H    have reviewed scheduled and prn medications.  Physical Exam: General: a little slow mentally Heart: RRR Lungs: mosty clear Abdomen: soft, non tender Extremities: 2- 3+ pitting edema Dialysis Access: left AVF-- patent    02/14/2014,11:44 AM  LOS: 3 days

## 2014-02-15 ENCOUNTER — Encounter (HOSPITAL_COMMUNITY): Payer: Self-pay | Admitting: General Surgery

## 2014-02-15 ENCOUNTER — Inpatient Hospital Stay (HOSPITAL_COMMUNITY): Payer: Medicaid Other

## 2014-02-15 DIAGNOSIS — I10 Essential (primary) hypertension: Secondary | ICD-10-CM

## 2014-02-15 DIAGNOSIS — N179 Acute kidney failure, unspecified: Secondary | ICD-10-CM

## 2014-02-15 DIAGNOSIS — N184 Chronic kidney disease, stage 4 (severe): Secondary | ICD-10-CM

## 2014-02-15 DIAGNOSIS — K8020 Calculus of gallbladder without cholecystitis without obstruction: Secondary | ICD-10-CM

## 2014-02-15 DIAGNOSIS — R1011 Right upper quadrant pain: Secondary | ICD-10-CM

## 2014-02-15 LAB — BASIC METABOLIC PANEL
BUN: 92 mg/dL — ABNORMAL HIGH (ref 6–23)
CHLORIDE: 100 meq/L (ref 96–112)
CO2: 19 mEq/L (ref 19–32)
Calcium: 8.2 mg/dL — ABNORMAL LOW (ref 8.4–10.5)
Creatinine, Ser: 7.37 mg/dL — ABNORMAL HIGH (ref 0.50–1.35)
GFR calc Af Amer: 10 mL/min — ABNORMAL LOW (ref >90)
GFR, EST NON AFRICAN AMERICAN: 8 mL/min — AB (ref >90)
GLUCOSE: 110 mg/dL — AB (ref 70–99)
POTASSIUM: 4.1 meq/L (ref 3.7–5.3)
Sodium: 138 mEq/L (ref 137–147)

## 2014-02-15 LAB — PARATHYROID HORMONE, INTACT (NO CA): PTH: 623.9 pg/mL — ABNORMAL HIGH (ref 14.0–72.0)

## 2014-02-15 MED ORDER — FUROSEMIDE 80 MG PO TABS: 160.0000 mg | ORAL_TABLET | Freq: Every day | ORAL | Status: DC

## 2014-02-15 MED ORDER — METOLAZONE 5 MG PO TABS: 5.0000 mg | ORAL_TABLET | Freq: Every day | ORAL | Status: DC

## 2014-02-15 MED ORDER — FUROSEMIDE 80 MG PO TABS: 160.0000 mg | ORAL_TABLET | Freq: Two times a day (BID) | ORAL | Status: DC

## 2014-02-15 MED ORDER — POTASSIUM CHLORIDE ER 10 MEQ PO TBCR: 20.0000 meq | EXTENDED_RELEASE_TABLET | Freq: Every day | ORAL | Status: DC

## 2014-02-15 NOTE — Discharge Summary (Signed)
Physician Discharge Summary  Matthew Beard ZOX:[id number] DOB: 02/04/1976 DOA: 02/11/2014  PCP: [name], MD  Admit date: 02/11/2014 Discharge date: 02/15/2014  Time spent: 50* minutes  Recommendations for Outpatient Follow-up:  1. *Follow up Dr Hyman Hopes in one week  Discharge Diagnoses:  Active Problems:   Uncontrolled hypertension   Acute on chronic kidney disease, stage 4   Pulmonary edema   Dyspnea   Discharge Condition: *Stable  Diet recommendation: Low salt diet  Filed Weights   02/11/14 2025 02/12/14 2239 02/14/14 1100  Weight: 78.744 kg (173 lb 9.6 oz) 79.5 kg (175 lb 4.3 oz) 77.565 kg (171 lb)    History of present illness:  38 -year-old male who has a past medical history of Hypertension; CKD (chronic kidney disease), stage IV; Normocytic anemia; Pericardial effusion; Cerebrovascular disease; NICM (nonischemic cardiomyopathy); CHF (congestive heart failure); Shortness of breath; and Headache(784.0).  Today presented to the ED with chief complaint of him shortness of breath for past one week. Patient has history of CK D. stage IV and is followed by nephrology as outpatient. As per patient he has been taking Lasix as prescribed, but over the past one week he is noticed worsening shortness of breath which is worse when he is lying down, he denies any chest pain, no nausea vomiting or diarrhea. Patient has been walking and is not bedbound.  Patient has history of nonischemic cardio myopathy, last EF was 20% by echocardiogram.  Hospital Course:  1. Dyspnea- Secondary to pulmonary edema, improved with IV lasix.  high dose lasix 160 mg IV q 8hr, good diuresis and pulmonary edema has resolved. Nephrology has cleared him for discharge, will send him home on lasix 160 mg po BID along with zaroxolyn 5 mg po daily. Will follow up Dr Hyman Hopes in one week. 2. Acute on CKD- Stage 5 at [place], Creatinine is worsening, No HD yet.  3. Hypokalemia- Potassium was replaced, will discharge him  on Kdur 20 meq po daily. 4. RUQ guarding- patient had right upper quadrant guarding on the examination, Abdominal ultrasound was obtained , which showed thickened gall bladder wall and sludge. LFT's are normal. Surgery was consulted, and recommend no further intervention. No cholecystitis. Started him on diet, he is eating without nausea/vomiting.   Procedures:  Abdominal ultrasound  Consultations:  None  Discharge Exam: Filed Vitals:   02/15/14 1234  BP: 151/91  Pulse: 67  Temp: 97.8 F (36.6 C)  Resp: 19    General: Appear in no acute distress Cardiovascular: S1s2 RRR Respiratory: Clear bilaterally  Discharge Instructions  Discharge Orders   Future Appointments Provider Department Dept Phone   02/26/2014 2:15 PM Lars Masson, MD [facility] Office [phone]   Future Orders Complete By Expires   Diet - low sodium heart healthy  As directed    Increase activity slowly  As directed        Medication List         amLODipine 10 MG tablet  Commonly known as:  NORVASC  Take 1 tablet (10 mg total) by mouth daily.     carvedilol 25 MG tablet  Commonly known as:  COREG  Take 1 tablet (25 mg total) by mouth 2 (two) times daily with a meal.     cloNIDine 0.2 MG tablet  Commonly known as:  CATAPRES  Take 1 tablet (0.2 mg total) by mouth 3 (three) times daily.     furosemide 80 MG tablet  Commonly known as:  LASIX  Take 2 tablets (160 mg total) by mouth 2 (two) times daily.     hydrALAZINE 100 MG tablet  Commonly known as:  APRESOLINE  Take 100 mg by mouth every 8 (eight) hours.     metolazone 5 MG tablet  Commonly known as:  ZAROXOLYN  Take 1 tablet (5 mg total) by mouth daily.     potassium chloride 10 MEQ tablet  Commonly known as:  K-DUR  Take 2 tablets (20 mEq total) by mouth daily.       Allergies  Allergen Reactions  . Amoxicillin Hives       Follow-up Information   Follow up with [name], MD. Schedule an appointment as  soon as possible for a visit in 1 week.   Specialty:  Nephrology   Contact information:   [address] [place] [address] [phone]        The results of significant diagnostics from this hospitalization (including imaging, microbiology, ancillary and laboratory) are listed below for reference.    Significant Diagnostic Studies: Dg Chest 2 View  02/11/2014   CLINICAL DATA:  Shortness of breath, weakness  EXAM: CHEST  2 VIEW  COMPARISON:  07/01/2013  FINDINGS: Marked cardiomegaly with increased vascular and interstitial prominence as well as small effusions compatible with background mild edema. Patchy peripheral areas of focal airspace disease/ consolidation in the right lower lobe and left midlung, nonspecific but could represent areas of atelectasis/collapse or superimposed pneumonia.  Trachea is midline.  No pneumothorax.  No acute osseous finding.  IMPRESSION: Cardiomegaly with increased vascular and interstitial prominence compatible with mild edema  Trace pleural effusions bilaterally  Right lower lobe and left mid lung peripheral areas of atelectasis versus consolidation.   Electronically Signed   By: Ruel Favors M.D.   On: 02/11/2014 08:26   Nm Pulmonary Perfusion  02/11/2014   CLINICAL DATA:  Shortness of breath  EXAM: NUCLEAR MEDICINE PERFUSION LUNG SCAN  TECHNIQUE: Perfusion images were obtained in multiple projections after intravenous injection of radiopharmaceutical.  RADIOPHARMACEUTICALS:  Tc-41m MAA  COMPARISON:  None.  FINDINGS: There is adequate uptake throughout both lungs on perfusion imaging. Cardiomegaly is noted similar to that seen on recent chest x-ray. Small defect is noted laterally in the left lung which corresponds to a density seen on the recent chest x-ray. No sizable defect to suggest segmental pulmonary embolism is noted.  IMPRESSION: Small defect laterally within the left lung which corresponds to a chest x-ray abnormality. No segmental defects are  identified to suggest pulmonary embolism.   Electronically Signed   By: Alcide Clever M.D.   On: 02/11/2014 13:32   US Abdomen Complete  02/15/2014   CLINICAL DATA:  Right upper quadrant abdominal pain  EXAM: ULTRASOUND ABDOMEN COMPLETE  COMPARISON:  None.  FINDINGS: Gallbladder:  The gallbladder wall is thickened measuring 8 mm. Sludge is appreciated within the gallbladder. There is no evidence of gallstones nor is sonographic Murphy's sign.  Common bile duct:  Diameter: 3 mm  Liver:  No focal lesion identified. Within normal limits in parenchymal echogenicity.  IVC:  No abnormality visualized.  Pancreas:  Visualized portion unremarkable.  Spleen:  Size and appearance within normal limits.  Right Kidney:  Length: 11.1 cm. Decreased corticomedullary differentiation. No evidence of hydronephrosis, solid or cystic masses, nor calculi.  Left Kidney:  Length: 10.8 cm. Decreased corticomedullary differentiation. No evidence of hydronephrosis, solid or cystic masses, nor calculi.  Abdominal aorta:  Small amount of ascites is appreciated within the  abdomen, left pleural effusion partially visualized  Other findings:  Areas ascites appreciated.  Partial visualization of a left pleural effusion  IMPRESSION: Gallbladder wall thickening and sludge within the gallbladder. These findings are equivocal. Differential considerations include gallbladder wall edema due to ascites, cholecystitis cannot be excluded. Clinical correlation recommended.  Left pleural effusion   Electronically Signed   By: Salome HolmesHector  Cooper M.D.   On: 02/15/2014 09:34    Microbiology: No results found for this or any previous visit (from the past 240 hour(s)).   Labs: Basic Metabolic Panel:  Recent Labs Lab 02/11/14 0800 02/11/14 1640 02/12/14 0532 02/13/14 0543 02/14/14 0600 02/15/14 0653  NA 137  --  136* 136* 134* 138  K 3.5*  --  3.4* 3.2* 3.2* 4.1  CL 99  --  100 98 95* 100  CO2 16*  --  16* 17* 18* 19  GLUCOSE 108*  --  96 90 93  110*  BUN 81*  --  84* 86* 88* 92*  CREATININE 6.91*  --  7.10* 7.11* 7.19* 7.37*  CALCIUM 7.6*  --  7.5* 7.4* 7.5* 8.2*  PHOS  --  6.5*  --  5.8* 5.5*  --    Liver Function Tests:  Recent Labs Lab 02/12/14 0532 02/13/14 0543 02/14/14 0600  AST 14  --   --   ALT 22  --   --   ALKPHOS 67  --   --   BILITOT <0.2*  --   --   PROT 6.7  --   --   ALBUMIN 2.2* 2.2* 2.3*   No results found for this basename: LIPASE, AMYLASE,  in the last 168 hours No results found for this basename: AMMONIA,  in the last 168 hours CBC:  Recent Labs Lab 02/11/14 0800 02/12/14 0532  WBC 4.6 5.7  NEUTROABS 3.7  --   HGB 10.8* 8.0*  HCT 31.2* 23.2*  MCV 81.9 83.5  PLT 166 201   Cardiac Enzymes:  Recent Labs Lab 02/11/14 0800 02/11/14 1640 02/11/14 2214 02/12/14 0532  TROPONINI <0.30 <0.30 <0.30 <0.30   BNP: BNP (last 3 results)  Recent Labs  06/29/13 0345 07/04/13 0640 02/11/14 0800  PROBNP 31751.0* 6732.0* >70000.0*   CBG: No results found for this basename: GLUCAP,  in the last 168 hours     Signed:  Quanita Barona S  [facility] 02/15/2014, 1:15 PM

## 2014-02-15 NOTE — Progress Notes (Signed)
Patient discharged.  Educated on discharge instructions, discharge medications, and follow-up appointment.  Educated on acute kidney injury and chronic kidney failure, signs and symptoms, and when to call a doctor.  Patient verbalized understanding.  AVS signed.  Telemetry box 12 removed and returned, CCMD notified.  IV removed.  Patient belongings gathered.  Escorted to ride.

## 2014-02-15 NOTE — Consult Note (Signed)
Reason for Consult: evaluation for cholecystitis  Referring Physician: Dr. Eleonore Chiquito  HPI: Matthew Beard is a 38 year old male with a history of CKD, non ischemic CMP, hypertension who was admitted on the 26th with dyspnea, fluid overload.  We have been asked to evaluate the patient for RUQ pain.  He had an ultrasound which showed gallbladder wall thickening and sludge.  She has normal LFTs and white count on the 27th.  The patient is tolerating a renal diet.  He denies ever having abdominal pain.  No previous symptoms.  Denies nausea or vomiting.  Denies fever, chills or sweats.  He is having bowel movements.  He wants to go home.    Past Medical History  Diagnosis Date  . Hypertension   . CKD (chronic kidney disease), stage IV   . Normocytic anemia   . Pericardial effusion     Remote hx  . Cerebrovascular disease     Chronic lacunar infarcts  . NICM (nonischemic cardiomyopathy)   . CHF (congestive heart failure)   . Shortness of breath     Past Surgical History  Procedure Laterality Date  . Av fistula placement Left 10/19/2013    Procedure: ARTERIOVENOUS (AV) FISTULA CREATION- LEFT RADIOCEPHALIC;  Surgeon: Rosetta Posner, MD;  [facility] OR;  Service: Vascular;  Laterality: Left;    Family History  Problem Relation Age of Onset  . Hypertension Mother   . Stroke Mother   . Heart disease Mother     Heart Disease before age 70  . Hypertension Father   . Stroke Father   . Heart disease Father     Heart Disease before age 49  . Hypertension Brother     Social History:  reports that he has never smoked. He has never used smokeless tobacco. He reports that he does not drink alcohol or use illicit drugs.  Allergies:  Allergies  Allergen Reactions  . Amoxicillin Hives    Medications:  Scheduled Meds: . calcium acetate  1,334 mg Oral TID WC  . carvedilol  25 mg Oral BID WC  . cloNIDine  0.2 mg Oral TID  . darbepoetin (ARANESP) injection - NON-DIALYSIS  100 mcg Subcutaneous  Q Thu-1800  . furosemide  160 mg Oral BID  . heparin  5,000 Units Subcutaneous 3 times per day  . hydrALAZINE  100 mg Oral 3 times per day  . metolazone  5 mg Oral Daily  . sodium chloride  3 mL Intravenous Q12H   Continuous Infusions:  PRN Meds:.sodium chloride, ondansetron (ZOFRAN) IV, ondansetron, sodium chloride   Results for orders placed during the hospital encounter of 02/11/14 (from the past 48 hour(s))  RENAL FUNCTION PANEL     Status: Abnormal   Collection Time    02/14/14  6:00 AM      Result Value Ref Range   Sodium 134 (*) 137 - 147 mEq/L   Potassium 3.2 (*) 3.7 - 5.3 mEq/L   Chloride 95 (*) 96 - 112 mEq/L   CO2 18 (*) 19 - 32 mEq/L   Glucose, Bld 93  70 - 99 mg/dL   BUN 88 (*) 6 - 23 mg/dL   Creatinine, Ser 7.19 (*) 0.50 - 1.35 mg/dL   Calcium 7.5 (*) 8.4 - 10.5 mg/dL   Phosphorus 5.5 (*) 2.3 - 4.6 mg/dL   Albumin 2.3 (*) 3.5 - 5.2 g/dL   GFR calc non Af Amer 9 (*) >90 mL/min   GFR calc Af Amer 10 (*) >90  mL/min   Comment: (NOTE)     The eGFR has been calculated using the CKD EPI equation.     This calculation has not been validated in all clinical situations.     eGFR's persistently <90 mL/min signify possible Chronic Kidney     Disease.  BASIC METABOLIC PANEL     Status: Abnormal   Collection Time    02/15/14  6:53 AM      Result Value Ref Range   Sodium 138  137 - 147 mEq/L   Potassium 4.1  3.7 - 5.3 mEq/L   Chloride 100  96 - 112 mEq/L   CO2 19  19 - 32 mEq/L   Glucose, Bld 110 (*) 70 - 99 mg/dL   BUN 92 (*) 6 - 23 mg/dL   Creatinine, Ser 7.37 (*) 0.50 - 1.35 mg/dL   Calcium 8.2 (*) 8.4 - 10.5 mg/dL   GFR calc non Af Amer 8 (*) >90 mL/min   GFR calc Af Amer 10 (*) >90 mL/min   Comment: (NOTE)     The eGFR has been calculated using the CKD EPI equation.     This calculation has not been validated in all clinical situations.     eGFR's persistently <90 mL/min signify possible Chronic Kidney     Disease.    US Abdomen Complete  02/15/2014    CLINICAL DATA:  Right upper quadrant abdominal pain  EXAM: ULTRASOUND ABDOMEN COMPLETE  COMPARISON:  None.  FINDINGS: Gallbladder:  The gallbladder wall is thickened measuring 8 mm. Sludge is appreciated within the gallbladder. There is no evidence of gallstones nor is sonographic Murphy's sign.  Common bile duct:  Diameter: 3 mm  Liver:  No focal lesion identified. Within normal limits in parenchymal echogenicity.  IVC:  No abnormality visualized.  Pancreas:  Visualized portion unremarkable.  Spleen:  Size and appearance within normal limits.  Right Kidney:  Length: 11.1 cm. Decreased corticomedullary differentiation. No evidence of hydronephrosis, solid or cystic masses, nor calculi.  Left Kidney:  Length: 10.8 cm. Decreased corticomedullary differentiation. No evidence of hydronephrosis, solid or cystic masses, nor calculi.  Abdominal aorta:  Small amount of ascites is appreciated within the abdomen, left pleural effusion partially visualized  Other findings:  Areas ascites appreciated.  Partial visualization of a left pleural effusion  IMPRESSION: Gallbladder wall thickening and sludge within the gallbladder. These findings are equivocal. Differential considerations include gallbladder wall edema due to ascites, cholecystitis cannot be excluded. Clinical correlation recommended.  Left pleural effusion   Electronically Signed   By: Margaree Mackintosh M.D.   On: 02/15/2014 09:34    Review of Systems  Constitutional: Negative for fever and chills.  Respiratory: Negative for shortness of breath.   Cardiovascular: Positive for leg swelling. Negative for chest pain and palpitations.  Gastrointestinal: Negative for nausea, vomiting, abdominal pain, diarrhea, constipation, blood in stool and melena.   Blood pressure 148/105, pulse 60, temperature 97.6 F (36.4 C), temperature source Oral, resp. rate 18, height 5' 6"  (1.676 m), weight 171 lb (77.565 kg), SpO2 98.00%. Physical Exam  Constitutional: He is oriented  to person, place, and time. He appears well-developed and well-nourished. He appears distressed.  Cardiovascular: Normal rate and regular rhythm.   Murmur heard. +click  Respiratory: Effort normal and breath sounds normal. No respiratory distress. He has no wheezes. He has no rales. He exhibits no tenderness.  GI: Soft. Bowel sounds are normal. He exhibits no distension and no mass. There is no tenderness. There is  no rebound and no guarding.  Negative murphy's sign  Musculoskeletal:  +2-3 pitting BLE edema  Neurological: He is alert and oriented to person, place, and time.  Skin: Skin is warm and dry. He is not diaphoretic.  Psychiatric: He has a normal mood and affect. His behavior is normal. Judgment and thought content normal.    Assessment/Plan: The patient is non tender, tolerating a diet and does not have cholecystitis.  No further imaging is needed at this time.  May follow up in our clinic if needed.    Erby Pian  ANP-BC Pager [phone]  02/15/2014, 11:51 AM

## 2014-02-15 NOTE — Consult Note (Signed)
Agree with above, he appears to have asymptomatic cholelithiasis, discussed possibility of disease moving forward and symptoms to watch for.

## 2014-02-26 ENCOUNTER — Emergency Department (HOSPITAL_COMMUNITY)
Admission: EM | Admit: 2014-02-26 | Discharge: 2014-02-26 | Discharge status: Against Medical Advice | Payer: Medicaid Other | Attending: Emergency Medicine | Admitting: Emergency Medicine

## 2014-02-26 ENCOUNTER — Telehealth: Payer: Self-pay | Admitting: *Deleted

## 2014-02-26 ENCOUNTER — Ambulatory Visit (INDEPENDENT_AMBULATORY_CARE_PROVIDER_SITE_OTHER): Payer: Medicaid Other | Admitting: Cardiology

## 2014-02-26 ENCOUNTER — Encounter (HOSPITAL_COMMUNITY): Payer: Self-pay | Admitting: Emergency Medicine

## 2014-02-26 ENCOUNTER — Encounter: Payer: Self-pay | Admitting: Cardiology

## 2014-02-26 VITALS — BP 156/103 | HR 79 | Ht 66.0 in | Wt 182.0 lb

## 2014-02-26 DIAGNOSIS — I129 Hypertensive chronic kidney disease with stage 1 through stage 4 chronic kidney disease, or unspecified chronic kidney disease: Secondary | ICD-10-CM | POA: Insufficient documentation

## 2014-02-26 DIAGNOSIS — I509 Heart failure, unspecified: Secondary | ICD-10-CM

## 2014-02-26 DIAGNOSIS — Z8673 Personal history of transient ischemic attack (TIA), and cerebral infarction without residual deficits: Secondary | ICD-10-CM | POA: Insufficient documentation

## 2014-02-26 DIAGNOSIS — N184 Chronic kidney disease, stage 4 (severe): Secondary | ICD-10-CM | POA: Insufficient documentation

## 2014-02-26 LAB — COMPREHENSIVE METABOLIC PANEL
ALBUMIN: 2.7 g/dL — AB (ref 3.5–5.2)
ALK PHOS: 100 U/L (ref 39–117)
ALT: 50 U/L (ref 0–53)
AST: 23 U/L (ref 0–37)
BUN: 104 mg/dL — AB (ref 6–23)
CO2: 16 mEq/L — ABNORMAL LOW (ref 19–32)
Calcium: 7.8 mg/dL — ABNORMAL LOW (ref 8.4–10.5)
Chloride: 98 mEq/L (ref 96–112)
Creatinine, Ser: 7.94 mg/dL — ABNORMAL HIGH (ref 0.50–1.35)
GFR calc Af Amer: 9 mL/min — ABNORMAL LOW (ref >90)
GFR calc non Af Amer: 8 mL/min — ABNORMAL LOW (ref >90)
Glucose, Bld: 114 mg/dL — ABNORMAL HIGH (ref 70–99)
POTASSIUM: 3.5 meq/L — AB (ref 3.7–5.3)
SODIUM: 138 meq/L (ref 137–147)
TOTAL PROTEIN: 7.1 g/dL (ref 6.0–8.3)
Total Bilirubin: 0.3 mg/dL (ref 0.3–1.2)

## 2014-02-26 LAB — BASIC METABOLIC PANEL
BUN: 106 mg/dL (ref 6–23)
CO2: 17 mEq/L — ABNORMAL LOW (ref 19–32)
Calcium: 7.8 mg/dL — ABNORMAL LOW (ref 8.4–10.5)
Chloride: 103 mEq/L (ref 96–112)
Creatinine, Ser: 8.1 mg/dL (ref 0.4–1.5)
GFR: 9.56 mL/min — CL (ref >60.00)
Glucose, Bld: 83 mg/dL (ref 70–99)
Potassium: 3.5 mEq/L (ref 3.5–5.1)
Sodium: 134 mEq/L — ABNORMAL LOW (ref 135–145)

## 2014-02-26 LAB — CBC WITH DIFFERENTIAL/PLATELET
BASOS ABS: 0 10*3/uL (ref 0.0–0.1)
BASOS PCT: 0 % (ref 0–1)
EOS ABS: 0 10*3/uL (ref 0.0–0.7)
Eosinophils Relative: 1 % (ref 0–5)
HCT: 27 % — ABNORMAL LOW (ref 39.0–52.0)
Hemoglobin: 9.3 g/dL — ABNORMAL LOW (ref 13.0–17.0)
Lymphocytes Relative: 12 % (ref 12–46)
Lymphs Abs: 0.6 10*3/uL — ABNORMAL LOW (ref 0.7–4.0)
MCH: 29.4 pg (ref 26.0–34.0)
MCHC: 34.4 g/dL (ref 30.0–36.0)
MCV: 85.4 fL (ref 78.0–100.0)
Monocytes Absolute: 0.3 10*3/uL (ref 0.1–1.0)
Monocytes Relative: 6 % (ref 3–12)
Neutro Abs: 3.7 10*3/uL (ref 1.7–7.7)
Neutrophils Relative %: 81 % — ABNORMAL HIGH (ref 43–77)
PLATELETS: 199 10*3/uL (ref 150–400)
RBC: 3.16 MIL/uL — ABNORMAL LOW (ref 4.22–5.81)
RDW: 18 % — ABNORMAL HIGH (ref 11.5–15.5)
WBC: 4.6 10*3/uL (ref 4.0–10.5)

## 2014-02-26 MED ORDER — ISOSORBIDE MONONITRATE ER 30 MG PO TB24
30.0000 mg | ORAL_TABLET | Freq: Every day | ORAL | Status: DC
Start: 2014-02-26 — End: 2014-03-16

## 2014-02-26 NOTE — Telephone Encounter (Signed)
error 

## 2014-02-26 NOTE — Telephone Encounter (Signed)
After further review by Dr Delton See she recommended pt report to ED now.

## 2014-02-26 NOTE — Telephone Encounter (Signed)
I spoke with patient's wife and notified her to take pt to [facility] ED now per  Dr Delton See for further evaluation of his chronic kidney disease and abnormal BMET results from today.

## 2014-02-26 NOTE — ED Notes (Signed)
No answer in waiting area.

## 2014-02-26 NOTE — ED Notes (Signed)
The pt was sent here from his doctors office because his blood work was abnormal.  He has a fistula in his rt upper arm but he has not been dialyzed at all yet.  weakness

## 2014-02-26 NOTE — Progress Notes (Signed)
Patient ID: Matthew Beard, male   DOB: Jun 19, 1976, 38 y.o.   MRN: [id number]    Patient Name: Matthew Beard Date of Encounter: 02/26/2014  Primary Care Provider:  [name], MD Primary Cardiologist:  Lars Masson  Patient Profile  Hypertension, CHF  Problem List   Active Problems:  Chronic systolic CHF (congestive heart failure)  Acute on chronic kidney disease, stage 4  Hypertensive cardiomyopathy  Uncontrolled hypertension  Anemia  History of noncompliance with medical treatment  Mild pulmonary hypertension  Abnormal finding on echocardiogram  Cerebrovascular disease  Past Medical History  Diagnosis Date  . Hypertension   . CKD (chronic kidney disease), stage IV   . Normocytic anemia   . Pericardial effusion     Remote hx  . Cerebrovascular disease     Chronic lacunar infarcts  . NICM (nonischemic cardiomyopathy)   . CHF (congestive heart failure)   . Shortness of breath    Past Surgical History  Procedure Laterality Date  . Av fistula placement Left 10/19/2013    Procedure: ARTERIOVENOUS (AV) FISTULA CREATION- LEFT RADIOCEPHALIC;  Surgeon: Larina Earthly, MD;  Location: [facility] OR;  Service: Vascular;  Laterality: Left;   Allergies  Allergies  Allergen Reactions  . Amoxicillin Hives   HPI  38 year old male with hypertension that was diagnosed in his late twenties. He has has multiple hospital visits for hypertensive urgency in the past. He was hospitalized in August 2014 when he presented with CHF due to hypertensive emergency and was found to have diffuse LV dysfunction (LV EF 20%). No prior cath or ischemic work up. He was diuresed, his BP meds were optimized and sent home. He is compliant with his meds.   Over the time his LVEF has improved, in 01/2014 it was 50%, mild pulmonary hypertension.  As a result of long standing HTN he has CKD stage 5. He has been evaluated by nephrology with a fistula placement, however no dialysis yet. He is still producing  urine. His last labs: Crea 7.37, BUN 92, in October 2014 Crea 4.11, BUN 36.  He was hospitalized on 02/11/2014 with acute on chronic kidney failure with significant fluid overload. Diuretics were increased, currently Lasix 160 mg po bid and metolazone 5 mg po daily. The patient appears lethargic. His wife states that he has been like this for the few last weeks. He is very fatigued and dizzy, no syncopy, no palpitations. LE edema has improved but is persistent. + SOB on exertion.   The patient is falling asleep during the conversation.   Home Medications  Prior to Admission medications   Medication Sig Start Date End Date Taking? Authorizing Provider  amLODipine (NORVASC) 10 MG tablet Take 10 mg by mouth daily.    Historical Provider, MD  aspirin EC 325 MG EC tablet Take 1 tablet (325 mg total) by mouth daily. 07/04/13   Roger A Arguello, PA-C  carvedilol (COREG) 25 MG tablet Take 1 tablet (25 mg total) by mouth 2 (two) times daily with a meal. 08/26/13   Lars Masson, MD  cloNIDine (CATAPRES) 0.2 MG tablet Take 1 tablet (0.2 mg total) by mouth 3 (three) times daily. 07/04/13   Roger A Arguello, PA-C  furosemide (LASIX) 80 MG tablet Take 1 tablet (80 mg total) by mouth daily. 08/26/13   Lars Masson, MD  hydrALAZINE (APRESOLINE) 100 MG tablet Take 1 tablet (100 mg total) by mouth every 8 (eight) hours. 07/04/13   Gery Pray, PA-C  isosorbide dinitrate (ISORDIL) 20 MG tablet Take 1 tablet (20 mg total) by mouth 3 (three) times daily. 08/26/13   Lars MassonKatarina H Nikelle Malatesta, MD    Family History  Family History  Problem Relation Age of Onset  . Hypertension Mother   . Stroke Mother   . Heart disease Mother     Heart Disease before age 39  . Hypertension Father   . Stroke Father   . Heart disease Father     Heart Disease before age 39  . Hypertension Brother     Social History  History   Social History  . Marital Status: Single    Spouse Name: N/A    Number of Children: N/A  .  Years of Education: N/A   Occupational History  . Not on file.   Social History Main Topics  . Smoking status: Never Smoker   . Smokeless tobacco: Never Used  . Alcohol Use: No  . Drug Use: No  . Sexual Activity: Not Currently   Other Topics Concern  . Not on file   Social History Narrative  . No narrative on file     Review of Systems General:  No chills, fever, night sweats or weight changes.  Cardiovascular:  No chest pain, dyspnea on exertion, edema, orthopnea, palpitations, paroxysmal nocturnal dyspnea. Dermatological: No rash, lesions/masses Respiratory: No cough, dyspnea Urologic: No hematuria, dysuria Abdominal:   No nausea, vomiting, diarrhea, bright red blood per rectum, melena, or hematemesis Neurologic:  No visual changes, wkns, changes in mental status. All other systems reviewed and are otherwise negative except as noted above.  Physical Exam  Blood pressure 156/103, pulse 79, height 5\' 6"  (1.676 m), weight 182 lb (82.555 kg).  General: Pleasant, NAD Psych: Normal affect. Neuro: Alert and oriented X 3. Moves all extremities spontaneously. HEENT: Normal  Neck: Supple without bruits, JVD up to the jaws. Lungs:  Resp regular and unlabored, crackles B/L up to the 1/3 of the lung fields. Heart: RRR no s3, s4, or murmurs. Abdomen: Soft, non-tender, non-distended, BS + x 4.  Extremities: No clubbing, cyanosis, +4 pitting edema up to the knees. DP/PT/Radials 2+ and equal bilaterally.  Accessory Clinical Findings  ECG - SR, 80 BPM, LAE, LVH with repolarizations, QTc   Echocardiogram: 06/28/2013 Left ventricle: The cavity size was moderately dilated. There was moderate concentric hypertrophy. The estimated ejection fraction was 20%. Diffuse hypokinesis. There was mild spontaneous echo contrast, indicative of stasis. - Aortic valve: Trivial regurgitation. - Mitral valve: Moderate regurgitation. - Left atrium: The atrium was moderately dilated. - Right  ventricle: The cavity size was mildly dilated. Wall thickness was normal. - Right atrium: The atrium was moderately dilated. - Atrial septum: No defect or patent foramen ovale was identified. - Tricuspid valve: Severe regurgitation. - Pulmonary arteries: PA peak pressure: 52mm Hg (S). Impressions:   Echocardiogram 09/28/13 Left ventricle: The cavity size was normal. Wall thickness was increased in a pattern of severe LVH. Systolic function was mildly to moderately reduced. The estimated ejection fraction was in the range of 40% to 45%. Diffuse hypokinesis. Doppler parameters are consistent with abnormal left ventricular relaxation (grade 1 diastolic dysfunction).  01/2014 - Left ventricle: There is severe LVH. Etiology is not clear. The cavity size was normal. The estimated ejection fraction was 50%. Findings consistent with left ventricular diastolic dysfunction. - Aortic valve: Trivial regurgitation. - Mitral valve: Mild regurgitation. - Left atrium: The atrium was moderately dilated. - Right atrium: The atrium was mildly dilated. - Tricuspid valve:  Moderate regurgitation. - Pulmonary arteries: PA peak pressure: 40mm Hg (S).  ECG: SR, HR 72 BPM, LVH with repolarization abnormalities, prolonged QT    Assessment & Plan  38 year old male with known malignant hypertension with CKD and CHF coming with worsening fatigue, lethargy, dizziness, LE edema despite high dose of diuretics.   1. The patient has known chronic systolic CHF and mild pulmonary hypertension. However, his LVEF has been improving with improved BP control.  He is on high dose of diuretics and still significantly fluid overloaded. This patient requires HD to be started soon for additional fluid removal.  In addition, I believe that his lethargy, fatigue, dizziness are signs of uremia (the last BUN 92).  We will arrange for an early nephrology follow up with consideration of early HD initiation.   2. Acute on  chronic CKD stage 5  - as above  3. Hypertension - still elevated, we will add imdur 30 mg po daily  4. Prolonged QT- no palpitations, we will continue high dose of carvedilol.  Follow up in 1 month   Lars Masson, MD 02/26/2014, 2:48 PM

## 2014-02-26 NOTE — Telephone Encounter (Signed)
Critical labs results called by Tonya in [facility]--BMET done today--Cr 8.1/GFR 9.6/K 3.5/BUN not finalized. I reported  these results to Dr Delton See. Per Dr Daisey Must new recommendations, pt had been told at time of office visit today that he needed urgent follow up with Dr Hyman Hopes. Results not reported in Epic yet so I am unable to send a copy to Dr Hyman Hopes.

## 2014-02-26 NOTE — ED Notes (Signed)
No answer in waiting room 

## 2014-02-26 NOTE — Patient Instructions (Signed)
Your physician recommends that you return for lab work today bmet.   Your physician has recommended you make the following change in your medication:   1. Start Imdur 30 mg 1 tablet daily.   Your physician recommends that you schedule a follow-up appointment in 1 month with Dr. Delton See.

## 2014-03-08 ENCOUNTER — Emergency Department (HOSPITAL_COMMUNITY): Payer: Medicaid Other

## 2014-03-08 ENCOUNTER — Encounter (HOSPITAL_COMMUNITY): Payer: Self-pay | Admitting: Emergency Medicine

## 2014-03-08 ENCOUNTER — Inpatient Hospital Stay (HOSPITAL_COMMUNITY)
Admission: EM | Admit: 2014-03-08 | Discharge: 2014-03-16 | DRG: 291 | Disposition: A | Discharge status: Home / Self Care | Payer: Medicaid Other | Attending: Internal Medicine | Admitting: Internal Medicine

## 2014-03-08 DIAGNOSIS — R7401 Elevation of levels of liver transaminase levels: Secondary | ICD-10-CM

## 2014-03-08 DIAGNOSIS — K761 Chronic passive congestion of liver: Secondary | ICD-10-CM | POA: Diagnosis present

## 2014-03-08 DIAGNOSIS — I120 Hypertensive chronic kidney disease with stage 5 chronic kidney disease or end stage renal disease: Secondary | ICD-10-CM | POA: Diagnosis present

## 2014-03-08 DIAGNOSIS — I5031 Acute diastolic (congestive) heart failure: Principal | ICD-10-CM | POA: Diagnosis present

## 2014-03-08 DIAGNOSIS — D696 Thrombocytopenia, unspecified: Secondary | ICD-10-CM | POA: Diagnosis present

## 2014-03-08 DIAGNOSIS — Z881 Allergy status to other antibiotic agents status: Secondary | ICD-10-CM

## 2014-03-08 DIAGNOSIS — N183 Chronic kidney disease, stage 3 unspecified: Secondary | ICD-10-CM

## 2014-03-08 DIAGNOSIS — I509 Heart failure, unspecified: Secondary | ICD-10-CM | POA: Diagnosis present

## 2014-03-08 DIAGNOSIS — M351 Other overlap syndromes: Secondary | ICD-10-CM

## 2014-03-08 DIAGNOSIS — Z8673 Personal history of transient ischemic attack (TIA), and cerebral infarction without residual deficits: Secondary | ICD-10-CM

## 2014-03-08 DIAGNOSIS — I161 Hypertensive emergency: Secondary | ICD-10-CM

## 2014-03-08 DIAGNOSIS — N189 Chronic kidney disease, unspecified: Secondary | ICD-10-CM

## 2014-03-08 DIAGNOSIS — R0989 Other specified symptoms and signs involving the circulatory and respiratory systems: Secondary | ICD-10-CM

## 2014-03-08 DIAGNOSIS — D631 Anemia in chronic kidney disease: Secondary | ICD-10-CM | POA: Diagnosis present

## 2014-03-08 DIAGNOSIS — D8989 Other specified disorders involving the immune mechanism, not elsewhere classified: Secondary | ICD-10-CM

## 2014-03-08 DIAGNOSIS — R740 Nonspecific elevation of levels of transaminase and lactic acid dehydrogenase [LDH]: Secondary | ICD-10-CM

## 2014-03-08 DIAGNOSIS — N2581 Secondary hyperparathyroidism of renal origin: Secondary | ICD-10-CM | POA: Diagnosis present

## 2014-03-08 DIAGNOSIS — Z992 Dependence on renal dialysis: Secondary | ICD-10-CM

## 2014-03-08 DIAGNOSIS — R040 Epistaxis: Secondary | ICD-10-CM

## 2014-03-08 DIAGNOSIS — Z91199 Patient's noncompliance with other medical treatment and regimen due to unspecified reason: Secondary | ICD-10-CM

## 2014-03-08 DIAGNOSIS — Z8249 Family history of ischemic heart disease and other diseases of the circulatory system: Secondary | ICD-10-CM

## 2014-03-08 DIAGNOSIS — R0600 Dyspnea, unspecified: Secondary | ICD-10-CM | POA: Diagnosis present

## 2014-03-08 DIAGNOSIS — R7402 Elevation of levels of lactic acid dehydrogenase (LDH): Secondary | ICD-10-CM | POA: Diagnosis present

## 2014-03-08 DIAGNOSIS — Z823 Family history of stroke: Secondary | ICD-10-CM

## 2014-03-08 DIAGNOSIS — I428 Other cardiomyopathies: Secondary | ICD-10-CM

## 2014-03-08 DIAGNOSIS — N184 Chronic kidney disease, stage 4 (severe): Secondary | ICD-10-CM

## 2014-03-08 DIAGNOSIS — N039 Chronic nephritic syndrome with unspecified morphologic changes: Secondary | ICD-10-CM

## 2014-03-08 DIAGNOSIS — N5089 Other specified disorders of the male genital organs: Secondary | ICD-10-CM | POA: Diagnosis present

## 2014-03-08 DIAGNOSIS — I679 Cerebrovascular disease, unspecified: Secondary | ICD-10-CM

## 2014-03-08 DIAGNOSIS — Z01810 Encounter for preprocedural cardiovascular examination: Secondary | ICD-10-CM

## 2014-03-08 DIAGNOSIS — R0609 Other forms of dyspnea: Secondary | ICD-10-CM

## 2014-03-08 DIAGNOSIS — N186 End stage renal disease: Secondary | ICD-10-CM | POA: Diagnosis present

## 2014-03-08 DIAGNOSIS — J811 Chronic pulmonary edema: Secondary | ICD-10-CM | POA: Diagnosis present

## 2014-03-08 DIAGNOSIS — Z9119 Patient's noncompliance with other medical treatment and regimen: Secondary | ICD-10-CM

## 2014-03-08 DIAGNOSIS — N19 Unspecified kidney failure: Secondary | ICD-10-CM

## 2014-03-08 DIAGNOSIS — I10 Essential (primary) hypertension: Secondary | ICD-10-CM | POA: Diagnosis present

## 2014-03-08 DIAGNOSIS — Z79899 Other long term (current) drug therapy: Secondary | ICD-10-CM

## 2014-03-08 DIAGNOSIS — D649 Anemia, unspecified: Secondary | ICD-10-CM

## 2014-03-08 DIAGNOSIS — R601 Generalized edema: Secondary | ICD-10-CM

## 2014-03-08 DIAGNOSIS — J9600 Acute respiratory failure, unspecified whether with hypoxia or hypercapnia: Secondary | ICD-10-CM | POA: Diagnosis not present

## 2014-03-08 DIAGNOSIS — N179 Acute kidney failure, unspecified: Secondary | ICD-10-CM

## 2014-03-08 LAB — I-STAT TROPONIN, ED: Troponin i, poc: 0.13 ng/mL (ref 0.00–0.08)

## 2014-03-08 LAB — CBC WITH DIFFERENTIAL/PLATELET
Basophils Absolute: 0 10*3/uL (ref 0.0–0.1)
Basophils Relative: 0 % (ref 0–1)
EOS PCT: 2 % (ref 0–5)
Eosinophils Absolute: 0.1 10*3/uL (ref 0.0–0.7)
HCT: 28.7 % — ABNORMAL LOW (ref 39.0–52.0)
HEMOGLOBIN: 9.6 g/dL — AB (ref 13.0–17.0)
LYMPHS ABS: 0.6 10*3/uL — AB (ref 0.7–4.0)
LYMPHS PCT: 8 % — AB (ref 12–46)
MCH: 29.1 pg (ref 26.0–34.0)
MCHC: 33.4 g/dL (ref 30.0–36.0)
MCV: 87 fL (ref 78.0–100.0)
Monocytes Absolute: 0.7 10*3/uL (ref 0.1–1.0)
Monocytes Relative: 10 % (ref 3–12)
Neutro Abs: 5.7 10*3/uL (ref 1.7–7.7)
Neutrophils Relative %: 80 % — ABNORMAL HIGH (ref 43–77)
PLATELETS: 198 10*3/uL (ref 150–400)
RBC: 3.3 MIL/uL — AB (ref 4.22–5.81)
RDW: 17.6 % — ABNORMAL HIGH (ref 11.5–15.5)
WBC: 7.2 10*3/uL (ref 4.0–10.5)

## 2014-03-08 LAB — COMPREHENSIVE METABOLIC PANEL
ALT: 247 U/L — AB (ref 0–53)
AST: 77 U/L — ABNORMAL HIGH (ref 0–37)
Albumin: 2.6 g/dL — ABNORMAL LOW (ref 3.5–5.2)
Alkaline Phosphatase: 220 U/L — ABNORMAL HIGH (ref 39–117)
BUN: 110 mg/dL — ABNORMAL HIGH (ref 6–23)
CO2: 19 meq/L (ref 19–32)
Calcium: 7.1 mg/dL — ABNORMAL LOW (ref 8.4–10.5)
Chloride: 95 mEq/L — ABNORMAL LOW (ref 96–112)
Creatinine, Ser: 8.35 mg/dL — ABNORMAL HIGH (ref 0.50–1.35)
GFR calc Af Amer: 8 mL/min — ABNORMAL LOW (ref >90)
GFR, EST NON AFRICAN AMERICAN: 7 mL/min — AB (ref >90)
GLUCOSE: 109 mg/dL — AB (ref 70–99)
Potassium: 3.4 mEq/L — ABNORMAL LOW (ref 3.7–5.3)
SODIUM: 139 meq/L (ref 137–147)
Total Bilirubin: 0.5 mg/dL (ref 0.3–1.2)
Total Protein: 7.4 g/dL (ref 6.0–8.3)

## 2014-03-08 LAB — PROTIME-INR
INR: 1.22 (ref 0.00–1.49)
Prothrombin Time: 15.1 seconds (ref 11.6–15.2)

## 2014-03-08 LAB — MRSA PCR SCREENING: MRSA BY PCR: NEGATIVE

## 2014-03-08 LAB — TROPONIN I
Troponin I: <0.3 ng/mL (ref <0.30)
Troponin I: <0.3 ng/mL (ref <0.30)
Troponin I: <0.3 ng/mL (ref <0.30)

## 2014-03-08 LAB — PRO B NATRIURETIC PEPTIDE: Pro B Natriuretic peptide (BNP): >70000 pg/mL — ABNORMAL HIGH (ref 0–125)

## 2014-03-08 LAB — HEMOGLOBIN A1C
Hgb A1c MFr Bld: 5.3 % (ref <5.7)
Mean Plasma Glucose: 105 mg/dL (ref <117)

## 2014-03-08 LAB — TSH: TSH: 2.96 u[IU]/mL (ref 0.350–4.500)

## 2014-03-08 MED ORDER — SODIUM CHLORIDE 0.9 % IJ SOLN
3.0000 mL | Freq: Two times a day (BID) | INTRAMUSCULAR | Status: DC
  Administered 2014-03-08 – 2014-03-15 (×13): 3 mL via INTRAVENOUS

## 2014-03-08 MED ORDER — CLONIDINE HCL 0.2 MG PO TABS
0.2000 mg | ORAL_TABLET | Freq: Once | ORAL | Status: AC
  Administered 2014-03-09: 0.2 mg via ORAL
  Filled 2014-03-08: qty 1

## 2014-03-08 MED ORDER — ALTEPLASE 2 MG IJ SOLR
2.0000 mg | Freq: Once | INTRAMUSCULAR | Status: DC | PRN
  Filled 2014-03-08: qty 2

## 2014-03-08 MED ORDER — ONDANSETRON HCL 4 MG PO TABS: 4.0000 mg | ORAL_TABLET | Freq: Four times a day (QID) | ORAL | Status: DC | PRN

## 2014-03-08 MED ORDER — HEPARIN SODIUM (PORCINE) 5000 UNIT/ML IJ SOLN
5000.0000 [IU] | Freq: Three times a day (TID) | INTRAMUSCULAR | Status: DC
  Administered 2014-03-08 – 2014-03-11 (×7): 5000 [IU] via SUBCUTANEOUS
  Filled 2014-03-08 (×11): qty 1

## 2014-03-08 MED ORDER — ACETAMINOPHEN 325 MG PO TABS: 650.0000 mg | ORAL_TABLET | Freq: Four times a day (QID) | ORAL | Status: DC | PRN

## 2014-03-08 MED ORDER — NEPRO/CARBSTEADY PO LIQD: 237.0000 mL | ORAL | Status: DC | PRN

## 2014-03-08 MED ORDER — SODIUM CHLORIDE 0.9 % IV SOLN: 100.0000 mL | INTRAVENOUS | Status: DC | PRN

## 2014-03-08 MED ORDER — LIDOCAINE HCL (PF) 1 % IJ SOLN: 5.0000 mL | INTRAMUSCULAR | Status: DC | PRN

## 2014-03-08 MED ORDER — HYDRALAZINE HCL 20 MG/ML IJ SOLN
10.0000 mg | Freq: Once | INTRAMUSCULAR | Status: AC
  Administered 2014-03-08: 10 mg via INTRAVENOUS
  Filled 2014-03-08: qty 1

## 2014-03-08 MED ORDER — ACETAMINOPHEN 650 MG RE SUPP: 650.0000 mg | Freq: Four times a day (QID) | RECTAL | Status: DC | PRN

## 2014-03-08 MED ORDER — MORPHINE SULFATE 2 MG/ML IJ SOLN
1.0000 mg | INTRAMUSCULAR | Status: DC | PRN
  Administered 2014-03-08 – 2014-03-09 (×2): 1 mg via INTRAVENOUS
  Filled 2014-03-08: qty 1

## 2014-03-08 MED ORDER — HEPARIN SODIUM (PORCINE) 1000 UNIT/ML DIALYSIS
20.0000 [IU]/kg | INTRAMUSCULAR | Status: DC | PRN
  Filled 2014-03-08: qty 2

## 2014-03-08 MED ORDER — ONDANSETRON HCL 4 MG/2ML IJ SOLN: 4.0000 mg | Freq: Four times a day (QID) | INTRAMUSCULAR | Status: DC | PRN

## 2014-03-08 MED ORDER — LIDOCAINE-PRILOCAINE 2.5-2.5 % EX CREA
1.0000 "application " | TOPICAL_CREAM | CUTANEOUS | Status: DC | PRN
  Filled 2014-03-08: qty 5

## 2014-03-08 MED ORDER — PENTAFLUOROPROP-TETRAFLUOROETH EX AERO
1.0000 | INHALATION_SPRAY | CUTANEOUS | Status: DC | PRN
Start: 2014-03-08 — End: 2014-03-08

## 2014-03-08 MED ORDER — CARVEDILOL 25 MG PO TABS
25.0000 mg | ORAL_TABLET | Freq: Two times a day (BID) | ORAL | Status: DC
  Administered 2014-03-08 – 2014-03-16 (×13): 25 mg via ORAL
  Filled 2014-03-08 (×18): qty 1

## 2014-03-08 MED ORDER — FUROSEMIDE 10 MG/ML IJ SOLN
120.0000 mg | Freq: Once | INTRAVENOUS | Status: DC
  Filled 2014-03-08: qty 12

## 2014-03-08 MED ORDER — HEPARIN SODIUM (PORCINE) 1000 UNIT/ML DIALYSIS
1000.0000 [IU] | INTRAMUSCULAR | Status: DC | PRN
  Filled 2014-03-08: qty 1

## 2014-03-08 MED ORDER — ALBUTEROL SULFATE (2.5 MG/3ML) 0.083% IN NEBU
2.5000 mg | INHALATION_SOLUTION | Freq: Four times a day (QID) | RESPIRATORY_TRACT | Status: DC
  Administered 2014-03-08 – 2014-03-09 (×2): 2.5 mg via RESPIRATORY_TRACT
  Filled 2014-03-08 (×3): qty 3

## 2014-03-08 MED ORDER — HYDROCODONE-ACETAMINOPHEN 5-325 MG PO TABS: 1.0000 | ORAL_TABLET | ORAL | Status: DC | PRN

## 2014-03-08 NOTE — Progress Notes (Signed)
Pt off to Dialysis in bed for first dialysis  run. Fiance aware that he may be gone for 4 hrs for dialysis.

## 2014-03-08 NOTE — ED Notes (Signed)
NOTIFIED DR. ALLEN OF PATIENTS LAB RESULTS OF TROPONIN ,03/08/2014.

## 2014-03-08 NOTE — Progress Notes (Signed)
Pt received from ED per stretcher - Pt took 5 steps to bed, VS done, weighed oriented to room, CHG bath and MRSA swab done.

## 2014-03-08 NOTE — Consult Note (Signed)
Reason for Consult:Progressive CKD in setting of malignant HTN and pulmonary edema Referring Physician: Arthor Captain, MD  Matthew Beard is an 38 y.o. male.  HPI: Pt is a 37yo AAM with PMH sig for non-ischemic cardiomyopathy, anemia of chronic disease, and progressive CKD stage 5 secondary to malignant HTN who presents to [facility] ED with 2 week h/o worsening SOB, DOE, orthopnea, lower ext edema, and poorly controlled BP.  He was recently discharged from [facility] 2weeks ago with a similar presentation and was supposed to follow up with Dr. Hyman Hopes, however pt did not contact the office for an appointment, nor did he notify any of his physicians of his worsening symptoms.  We were asked to see the patient due to his poorly controlled HTN, overt volume overload and progressive CKD.  His trend in Scr is seen below:  Trend in Creatinine:  Creatinine, Ser  Date/Time Value Ref Range Status  03/08/2014 12:24 PM 8.35* 0.50 - 1.35 mg/dL Final  4/35/6861  6:83 PM 7.94* 0.50 - 1.35 mg/dL Final  06/16/210  1:55 PM 8.1 Repeated and verified X2.* 0.4 - 1.5 mg/dL Final  12/27/221  3:61 AM 7.37* 0.50 - 1.35 mg/dL Final  01/12/4974  3:00 AM 7.19* 0.50 - 1.35 mg/dL Final  03/29/210  1:73 AM 7.11* 0.50 - 1.35 mg/dL Final  5/67/0141  0:30 AM 7.10* 0.50 - 1.35 mg/dL Final  12/19/4386  8:75 AM 6.91* 0.50 - 1.35 mg/dL Final  79/72/8206  0:15 AM 4.14* 0.50 - 1.35 mg/dL Final  61/53/7943  2:76 AM 4.11* 0.50 - 1.35 mg/dL Final  14/70/9295  7:47 AM 3.92* 0.50 - 1.35 mg/dL Final  34/01/7095  4:38 PM 4.30* 0.50 - 1.35 mg/dL Final  3/81/8403  7:54 AM 3.45* 0.50 - 1.35 mg/dL Final  3/60/6770  3:40 PM 3.37* 0.50 - 1.35 mg/dL Final  3/52/4818  5:90 AM 3.41* 0.50 - 1.35 mg/dL Final  9/31/1216  2:44 AM 3.30* 0.50 - 1.35 mg/dL Final  6/95/0722  5:75 AM 3.19* 0.50 - 1.35 mg/dL Final  0/51/8335  8:25 AM 3.48* 0.50 - 1.35 mg/dL Final  1/89/8421  0:31 AM 3.75* 0.50 - 1.35 mg/dL Final  2/81/1886  7:73 PM 3.92* 0.50 - 1.35 mg/dL Final  7/36/6815   9:47 AM 4.06* 0.50 - 1.35 mg/dL Final  0/05/6150  8:34 PM 3.88* 0.50 - 1.35 mg/dL Final  01/23/3577  9:78 AM 2.21* 0.50 - 1.35 mg/dL Final  02/24/8411 82:08 AM 2.21* 0.50 - 1.35 mg/dL Final  11/21/8869  9:59 PM 2.33* 0.50 - 1.35 mg/dL Final  05/22/7184 50:15 PM 1.80* 0.50 - 1.35 mg/dL Final  06/24/8256 49:35 AM 1.53* 0.50 - 1.35 mg/dL Final  04/08/7470 59:53 AM 1.43  0.4 - 1.5 mg/dL Final  9/67/2897  9:15 PM 1.3  0.4 - 1.5 mg/dL Final  02/17/3642  8:37 AM 1.10  0.4 - 1.5 mg/dL Final  79/01/9687  6:48 AM 1.28  0.4 - 1.5 mg/dL Final  47/12/719  8:28 AM 1.13  0.4 - 1.5 mg/dL Final  83/01/7444  1:46 PM 1.03  0.4 - 1.5 mg/dL Final  02/23/9986 21:58 AM 1.26  0.4 - 1.5 mg/dL Final  06/15/6183 85:92 AM 1.14  0.4 - 1.5 mg/dL Final  7/63/9432  0:03 AM 1.31  0.4 - 1.5 mg/dL Final  7/94/4461  9:01 PM 1.11  0.4 - 1.5 mg/dL Final  01/10/4113  6:43 PM 1.2   Final  04/02/2008  9:10 AM 1.2  0.4-1.5 mg/dL Final  11/22/2765  0:11 PM 1.18   Final  03/09/2008  7:20 AM 1.03   Final  03/08/2008 12:00 PM 1.16   Final  06/01/2007 12:01 AM 1.31   Final    PMH:   Past Medical History  Diagnosis Date  . Hypertension   . CKD (chronic kidney disease), stage IV   . Normocytic anemia   . Pericardial effusion     Remote hx  . Cerebrovascular disease     Chronic lacunar infarcts  . NICM (nonischemic cardiomyopathy)   . CHF (congestive heart failure)   . Shortness of breath     PSH:   Past Surgical History  Procedure Laterality Date  . Av fistula placement Left 10/19/2013    Procedure: ARTERIOVENOUS (AV) FISTULA CREATION- LEFT RADIOCEPHALIC;  Surgeon: Larina Earthly, MD;  Location: [facility] OR;  Service: Vascular;  Laterality: Left;    Allergies:  Allergies  Allergen Reactions  . Amoxicillin Hives    Medications:   Prior to Admission medications   Medication Sig Start Date End Date Taking? Authorizing Provider  amLODipine (NORVASC) 10 MG tablet Take 1 tablet (10 mg total) by mouth daily. 09/15/13  Yes Shanker Levora Dredge, MD   carvedilol (COREG) 25 MG tablet Take 1 tablet (25 mg total) by mouth 2 (two) times daily with a meal. 09/15/13  Yes Shanker Levora Dredge, MD  cloNIDine (CATAPRES) 0.2 MG tablet Take 1 tablet (0.2 mg total) by mouth 3 (three) times daily. 09/15/13  Yes Shanker Levora Dredge, MD  furosemide (LASIX) 80 MG tablet Take 2 tablets (160 mg total) by mouth 2 (two) times daily. 02/15/14  Yes Meredeth Ide, MD  hydrALAZINE (APRESOLINE) 100 MG tablet Take 100 mg by mouth every 8 (eight) hours.   Yes Historical Provider, MD  isosorbide mononitrate (IMDUR) 30 MG 24 hr tablet Take 1 tablet (30 mg total) by mouth daily. 02/26/14  Yes Lars Masson, MD  metolazone (ZAROXOLYN) 5 MG tablet Take 1 tablet (5 mg total) by mouth daily. 02/15/14  Yes Meredeth Ide, MD  potassium chloride (K-DUR,KLOR-CON) 10 MEQ tablet Take 20 mEq by mouth daily.   Yes Historical Provider, MD    Inpatient medications:    Discontinued Meds:  There are no discontinued medications.  Social History:  reports that he has never smoked. He has never used smokeless tobacco. He reports that he does not drink alcohol or use illicit drugs.  Family History:   Family History  Problem Relation Age of Onset  . Hypertension Mother   . Stroke Mother   . Heart disease Mother     Heart Disease before age 96  . Hypertension Father   . Stroke Father   . Heart disease Father     Heart Disease before age 50  . Hypertension Brother     A comprehensive review of systems was negative except for: Constitutional: positive for fatigue and malaise Respiratory: positive for dyspnea on exertion Cardiovascular: positive for fatigue, orthopnea and SOB Gastrointestinal: positive for nausea Weight change:  No intake or output data in the 24 hours ending 03/08/14 1557 BP 213/137  Pulse 80  Temp(Src) 97.9 F (36.6 C) (Oral)  Resp 14  SpO2 99% Filed Vitals:   03/08/14 1430 03/08/14 1445 03/08/14 1513 03/08/14 1530  BP: 221/153 225/142 208/137 213/137   Pulse:   86 80  Temp:      TempSrc:      Resp: 19 25 25 14   SpO2:    99%     General appearance: moderate distress and sitting bolt  upright in bed Head: Normocephalic, without obvious abnormality, atraumatic Neck: no adenopathy, no carotid bruit, supple, symmetrical, trachea midline, thyroid not enlarged, symmetric, no tenderness/mass/nodules and Marked bilateral jugular venous distention to mandibles Resp: rales bilaterally and rhonchi bilaterally Cardio: tachy no rub GI: soft, non-tender; bowel sounds normal; no masses,  no organomegaly Extremities: edema 3+ and L Forearm AVF +T/B  Labs: Basic Metabolic Panel:  Recent Labs Lab 03/08/14 1224  NA 139  K 3.4*  CL 95*  CO2 19  GLUCOSE 109*  BUN 110*  CREATININE 8.35*  ALBUMIN 2.6*  CALCIUM 7.1*   Liver Function Tests:  Recent Labs Lab 03/08/14 1224  AST 77*  ALT 247*  ALKPHOS 220*  BILITOT 0.5  PROT 7.4  ALBUMIN 2.6*   No results found for this basename: LIPASE, AMYLASE,  in the last 168 hours No results found for this basename: AMMONIA,  in the last 168 hours CBC:  Recent Labs Lab 03/08/14 1224  WBC 7.2  NEUTROABS 5.7  HGB 9.6*  HCT 28.7*  MCV 87.0  PLT 198   PT/INR: @LABRCNTIP (inr:5) Cardiac Enzymes: ) Recent Labs Lab 03/08/14 1424  TROPONINI <0.30   CBG: No results found for this basename: GLUCAP,  in the last 168 hours  Iron Studies: No results found for this basename: IRON, TIBC, TRANSFERRIN, FERRITIN,  in the last 168 hours  Xrays/Other Studies: Dg Chest Port 1 View  03/08/2014   CLINICAL DATA:  Chest pain  EXAM: PORTABLE CHEST - 1 VIEW  COMPARISON:  02/11/2014  FINDINGS: Cardiomegaly is noted. No acute infiltrate or pleural effusion. No pulmonary edema. Stable mild elevation of the right hemidiaphragm.  IMPRESSION: No active disease.  Cardiomegaly again noted.   Electronically Signed   By: Natasha MeadLiviu  Pop M.D.   On: 03/08/2014 13:12     Assessment/Plan: 1. Malignant HTN with pulmonary  edema and progressive CKD- plan for urgent HD with UF and BP/volume management 1. Hold BP meds until after HD 2. Check tox screen 2. ESRD- new ESRD will start HD and CLIP process 3. CHF- pt with h/o non-ischemic CMP likely due to poorly controlled HTN.  OK to give IV lasix 120mg  x 1 for now 4. Anemia of chronic kidney disease- on procrit 10,000 units SQ weekly, will change to IV EPO with HD 5. 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 6. Dispo- stabilize from cardiovascular standpoint and arrange for outpt HD    Irena CordsJoseph A Tariah Transue 03/08/2014, 3:57 PM

## 2014-03-08 NOTE — H&P (Signed)
[facility]                                                                                    Patient Demographics  Matthew SlickDamon Boike, is a 38 y.o. male  MRN: [id number]   DOB - 10/18/1976  Admit Date - 03/08/2014  Outpatient Primary MD for the patient is [name],[name], MD   With History of -  Past Medical History  Diagnosis Date  . Hypertension   . CKD (chronic kidney disease), stage IV   . Normocytic anemia   . Pericardial effusion     Remote hx  . Cerebrovascular disease     Chronic lacunar infarcts  . NICM (nonischemic cardiomyopathy)   . CHF (congestive heart failure)   . Shortness of breath      Past Surgical History  Procedure Laterality Date  . Av fistula placement Left 10/19/2013    Procedure: ARTERIOVENOUS (AV) FISTULA CREATION- LEFT RADIOCEPHALIC;  Surgeon: Larina Earthlyodd F Early, MD;  [facility];  Service: Vascular;  Laterality: Left;    in for   Chief Complaint  Patient presents with  . Shortness of Breath  . Leg Swelling     HPI  Matthew Beard  is a 38 y.o. African American male with past medical history of CKD stage V and hypertension, who presents to the ED with the primary complaint of shortness of breath. Patient had fistula placed on his LUE in December 2014, anticipating hemodialysis.  Patient was recently in the hospital, discharged about 3 weeks ago since then he started to have shortness of breath. He was sent to the Emergency room by his physician on 02/26/2014 but left before he was seen by the staff.  He has had an difficult time getting around the house and lately becoming more dyspneic than normal.  He complains of SOB, leg edema up to his waist, he does have orthopnea, PND but denies any substernal chest pain or palpitations. In the ED CXR wasn't convincing for pulmonary edema, patient creatinine is 8.3 and BUN is 110. His proBNP is >70,000. Patient also has transaminitis and low albumin. Nephrology consulted for further evaluation.  Review of  Systems   Review of Systems  Constitutional: Positive for malaise/fatigue. Negative for fever, chills and diaphoresis.  HENT: Negative for congestion, hearing loss and nosebleeds.   Eyes: Negative for blurred vision and double vision.  Respiratory: Positive for shortness of breath. Negative for cough and sputum production.   Cardiovascular: Positive for orthopnea and leg swelling.  Gastrointestinal: Negative for heartburn, nausea, vomiting and abdominal pain.  Musculoskeletal: Negative.   Skin: Negative for itching and rash.  Neurological: Positive for tingling (Left foot, 3x weeks) and weakness. Negative for headaches.  Endo/Heme/Allergies: Negative.   Psychiatric/Behavioral: Negative for depression, suicidal ideas and substance abuse.    A full 10 point Review of Systems was done, except as stated above, all other Review of Systems were negative.   Social History History  Substance Use Topics  . Smoking status: Never Smoker   . Smokeless tobacco: Never Used  . Alcohol Use: No     Family History Family History  Problem Relation Age of Onset  .  Hypertension Mother   . Stroke Mother   . Heart disease Mother     Heart Disease before age 50  . Hypertension Father   . Stroke Father   . Heart disease Father     Heart Disease before age 64  . Hypertension Brother      Prior to Admission medications   Medication Sig Start Date End Date Taking? Authorizing Provider  amLODipine (NORVASC) 10 MG tablet Take 1 tablet (10 mg total) by mouth daily. 09/15/13  Yes Shanker Levora Dredge, MD  carvedilol (COREG) 25 MG tablet Take 1 tablet (25 mg total) by mouth 2 (two) times daily with a meal. 09/15/13  Yes Shanker Levora Dredge, MD  cloNIDine (CATAPRES) 0.2 MG tablet Take 1 tablet (0.2 mg total) by mouth 3 (three) times daily. 09/15/13  Yes Shanker Levora Dredge, MD  furosemide (LASIX) 80 MG tablet Take 2 tablets (160 mg total) by mouth 2 (two) times daily. 02/15/14  Yes Meredeth Ide, MD   hydrALAZINE (APRESOLINE) 100 MG tablet Take 100 mg by mouth every 8 (eight) hours.   Yes Historical Provider, MD  isosorbide mononitrate (IMDUR) 30 MG 24 hr tablet Take 1 tablet (30 mg total) by mouth daily. 02/26/14  Yes Lars Masson, MD  metolazone (ZAROXOLYN) 5 MG tablet Take 1 tablet (5 mg total) by mouth daily. 02/15/14  Yes Meredeth Ide, MD  potassium chloride (K-DUR,KLOR-CON) 10 MEQ tablet Take 20 mEq by mouth daily.   Yes Historical Provider, MD    Allergies  Allergen Reactions  . Amoxicillin Hives    Physical Exam  Vitals  Blood pressure 222/151, pulse 87, temperature 97.4 F (36.3 C), temperature source Oral, resp. rate 22, height 5\' 7"  (1.702 m), weight 88.7 kg (195 lb 8.8 oz), SpO2 100.00%.   General:  Sitting up, NAD  Psych:  Normal affect and insight, Not Suicidal or Homicidal, Awake Alert, Oriented X 3.  Neuro:   No F.N deficits, ALL C.Nerves Intact, Strength 5/5 all 4 extremities, Sensation intact all 4 extremities, Plantars down going.  ENT:  Ears and Eyes appear Normal, Conjunctivae clear, PERRLA. Moist Oral Mucosa.  Neck:  Supple Neck, JVD present, No cervical lymphadenopathy appriciated, No Carotid Bruits.  Respiratory:  Symmetrical Chest wall movement, Good air movement bilaterally, CTAB.  Cardiac:  RRR, No Gallops, Rubs or Murmurs, No Parasternal Heave.  Abdomen:  Positive Bowel Sounds, Abdomen distended, Non tender, No organomegaly appriciated  Skin:  Cyanosis on his fingers expect right middle, Normal Skin Turgor, No Skin Rash or Bruise.  Extremities:  Good muscle tone,  joints appear normal , significant Bilateral lower extremity +4 edema, Normal ROM.   Data Review  CBC  Recent Labs Lab 03/08/14 1224  WBC 7.2  HGB 9.6*  HCT 28.7*  PLT 198  MCV 87.0  MCH 29.1  MCHC 33.4  RDW 17.6*  LYMPHSABS 0.6*  MONOABS 0.7  EOSABS 0.1  BASOSABS 0.0    ------------------------------------------------------------------------------------------------------------------  Chemistries   Recent Labs Lab 03/08/14 1224  NA 139  K 3.4*  CL 95*  CO2 19  GLUCOSE 109*  BUN 110*  CREATININE 8.35*  CALCIUM 7.1*  AST 77*  ALT 247*  ALKPHOS 220*  BILITOT 0.5      Cardiac Enzymes  Recent Labs Lab 03/08/14 1424  TROPONINI <0.30    Urinalysis    Component Value Date/Time   COLORURINE YELLOW 07/03/2013 0138   APPEARANCEUR CLEAR 07/03/2013 0138   LABSPEC 1.013 07/03/2013 0138   PHURINE 6.0  07/03/2013 0138   GLUCOSEU NEGATIVE 07/03/2013 0138   HGBUR LARGE* 07/03/2013 0138   BILIRUBINUR NEGATIVE 07/03/2013 0138   KETONESUR NEGATIVE 07/03/2013 0138   PROTEINUR 100* 07/03/2013 0138   UROBILINOGEN 0.2 07/03/2013 0138   NITRITE NEGATIVE 07/03/2013 0138   LEUKOCYTESUR TRACE* 07/03/2013 0138    ----------------------------------------------------------------------------------------------------------------  Imaging results:   Dg Chest 2 View  02/11/2014   CLINICAL DATA:  Shortness of breath, weakness  EXAM: CHEST  2 VIEW  COMPARISON:  07/01/2013  FINDINGS: Marked cardiomegaly with increased vascular and interstitial prominence as well as small effusions compatible with background mild edema. Patchy peripheral areas of focal airspace disease/ consolidation in the right lower lobe and left midlung, nonspecific but could represent areas of atelectasis/collapse or superimposed pneumonia.  Trachea is midline.  No pneumothorax.  No acute osseous finding.  IMPRESSION: Cardiomegaly with increased vascular and interstitial prominence compatible with mild edema  Trace pleural effusions bilaterally  Right lower lobe and left mid lung peripheral areas of atelectasis versus consolidation.   Electronically Signed   By: Ruel Favors M.D.   On: 02/11/2014 08:26   Nm Pulmonary Perfusion  02/11/2014   CLINICAL DATA:  Shortness of breath  EXAM: NUCLEAR MEDICINE  PERFUSION LUNG SCAN  TECHNIQUE: Perfusion images were obtained in multiple projections after intravenous injection of radiopharmaceutical.  RADIOPHARMACEUTICALS:  Tc-64m MAA  COMPARISON:  None.  FINDINGS: There is adequate uptake throughout both lungs on perfusion imaging. Cardiomegaly is noted similar to that seen on recent chest x-ray. Small defect is noted laterally in the left lung which corresponds to a density seen on the recent chest x-ray. No sizable defect to suggest segmental pulmonary embolism is noted.  IMPRESSION: Small defect laterally within the left lung which corresponds to a chest x-ray abnormality. No segmental defects are identified to suggest pulmonary embolism.   Electronically Signed   By: Alcide Clever M.D.   On: 02/11/2014 13:32   US Abdomen Complete  02/15/2014   CLINICAL DATA:  Right upper quadrant abdominal pain  EXAM: ULTRASOUND ABDOMEN COMPLETE  COMPARISON:  None.  FINDINGS: Gallbladder:  The gallbladder wall is thickened measuring 8 mm. Sludge is appreciated within the gallbladder. There is no evidence of gallstones nor is sonographic Murphy's sign.  Common bile duct:  Diameter: 3 mm  Liver:  No focal lesion identified. Within normal limits in parenchymal echogenicity.  IVC:  No abnormality visualized.  Pancreas:  Visualized portion unremarkable.  Spleen:  Size and appearance within normal limits.  Right Kidney:  Length: 11.1 cm. Decreased corticomedullary differentiation. No evidence of hydronephrosis, solid or cystic masses, nor calculi.  Left Kidney:  Length: 10.8 cm. Decreased corticomedullary differentiation. No evidence of hydronephrosis, solid or cystic masses, nor calculi.  Abdominal aorta:  Small amount of ascites is appreciated within the abdomen, left pleural effusion partially visualized  Other findings:  Areas ascites appreciated.  Partial visualization of a left pleural effusion  IMPRESSION: Gallbladder wall thickening and sludge within the gallbladder. These  findings are equivocal. Differential considerations include gallbladder wall edema due to ascites, cholecystitis cannot be excluded. Clinical correlation recommended.  Left pleural effusion   Electronically Signed   By: Salome Holmes M.D.   On: 02/15/2014 09:34   Dg Chest Port 1 View  03/08/2014   CLINICAL DATA:  Chest pain  EXAM: PORTABLE CHEST - 1 VIEW  COMPARISON:  02/11/2014  FINDINGS: Cardiomegaly is noted. No acute infiltrate or pleural effusion. No pulmonary edema. Stable mild elevation of the right hemidiaphragm.  IMPRESSION: No active disease.  Cardiomegaly again noted.   Electronically Signed   By: Natasha Mead M.D.   On: 03/08/2014 13:12    My personal review of EKG: Rhythm NSR, Rate  85 /min, QTc 560 , no Acute ST changes    Assessment & Plan  Principal Problem:   Dyspnea Active Problems:   Uncontrolled hypertension   Anemia in chronic kidney disease   Acute on chronic kidney disease, stage 4   End stage renal disease   Pulmonary edema   Renal failure    Acute renal failure on CKD stage V -CKD stage V with baseline creatinine of 7 to 7.5, presented with massive lower extremity edema. -Presented with BUN of 110 and creatinine of 8.35, was not able to remove fluids with oral diuretics. -Patient anticipated to be on dialysis since December of last year. Nephrology consulted. -Restrict fluid intake, renal diet. -Patient for hemodialysis.  Accelerated Hypertension -Likey due the volume overload, blood pressure as high as 221/147. -Patient BP will be monitored q 4 hours. -Hold home medication, restart only Coreg 12 avoid rebound tachycardia. -Will start home medications as necessary after dialysis, likely he will not need all of his medications.  Anemia -Secondary to ESRD, management per nephrology.  Transaminitis -Recent ultrasound showed biliary sludge. -Cannot rule out congestive hepatopathy secondary to volume overload. -Check hepatitis panel. Check INR.    DVT  Prophylaxis Heparin -  Lovenox   AM Labs Ordered, also please review Full Orders  Family Communication:   none   Code Status: full   Likely DC to  home  Condition:  stable  Time spent in minutes : 60   Basilia Jumbo PA-S on 03/08/2014 at 3:43 PM Clydia Llano, MD   Between 7am to 7pm - Pager - [phone] After 7pm go to [email or web address] - password [email or web address] And look for the night coverage person covering me after hours  [facility] Office  [phone]   Addendum  Patient seen and examined, chart and data base reviewed.  I agree with the above assessment and plan.  For full details please see Mr. Basilia Jumbo PA-S note.  I reviewed and amended the above note as needed   Clint Lipps, MD [facility] Pager: [phone] 03/08/2014, 4:37 PM

## 2014-03-08 NOTE — ED Notes (Addendum)
PT states for a couple weeks started having swelling to LE up to scrotal area and reports sob.  Pt states that he is on Lasix.  Pt reports lower back pain that is not normal for him.  Pt has HD graft in left arm and was told that he may need to start HD soon.

## 2014-03-08 NOTE — ED Notes (Signed)
Attempted Report 

## 2014-03-08 NOTE — ED Notes (Signed)
MD at bedside. 

## 2014-03-08 NOTE — Procedures (Signed)
Patient was seen on dialysis and the procedure was supervised. BFR 150 Via LAVF BP is 209/140.  Patient appears to be tolerating treatment well

## 2014-03-08 NOTE — ED Provider Notes (Signed)
CSN: [id number]     Arrival date & time 03/08/14  1210 History   First MD Initiated Contact with Patient 03/08/14 1250     Chief Complaint  Patient presents with  . Shortness of Breath  . Leg Swelling     (Consider location/radiation/quality/duration/timing/severity/associated sxs/prior Treatment) Patient is a 38 y.o. male presenting with shortness of breath. The history is provided by the patient and a friend.  Shortness of Breath  patient here with one-week history of worsening dyspnea exertion as well as orthopnea. Diagnosed with renal failure 2 weeks ago and told to come to the hospital. Patient to come to the hospital but left before being seen. Since that time symptoms have been progressively worse. Has had nonproductive cough. Denies anginal type chest pain. Has a known history of severe renal insufficiency and already has a dialysis graft. Symptoms have been progressively worse and nothing makes them better. Did not call his  Dr. prior to coming here  Past Medical History  Diagnosis Date  . Hypertension   . CKD (chronic kidney disease), stage IV   . Normocytic anemia   . Pericardial effusion     Remote hx  . Cerebrovascular disease     Chronic lacunar infarcts  . NICM (nonischemic cardiomyopathy)   . CHF (congestive heart failure)   . Shortness of breath    Past Surgical History  Procedure Laterality Date  . Av fistula placement Left 10/19/2013    Procedure: ARTERIOVENOUS (AV) FISTULA CREATION- LEFT RADIOCEPHALIC;  Surgeon: Larina Earthlyodd F Early, MD;  [facility];  Service: Vascular;  Laterality: Left;   Family History  Problem Relation Age of Onset  . Hypertension Mother   . Stroke Mother   . Heart disease Mother     Heart Disease before age 39  . Hypertension Father   . Stroke Father   . Heart disease Father     Heart Disease before age 39  . Hypertension Brother    History  Substance Use Topics  . Smoking status: Never Smoker   . Smokeless tobacco: Never Used  .  Alcohol Use: No    Review of Systems  Respiratory: Positive for shortness of breath.   All other systems reviewed and are negative.     Allergies  Amoxicillin  Home Medications   Prior to Admission medications   Medication Sig Start Date End Date Taking? Authorizing Provider  amLODipine (NORVASC) 10 MG tablet Take 1 tablet (10 mg total) by mouth daily. 09/15/13   Shanker Levora DredgeM Ghimire, MD  carvedilol (COREG) 25 MG tablet Take 1 tablet (25 mg total) by mouth 2 (two) times daily with a meal. 09/15/13   Shanker Levora DredgeM Ghimire, MD  cloNIDine (CATAPRES) 0.2 MG tablet Take 1 tablet (0.2 mg total) by mouth 3 (three) times daily. 09/15/13   Shanker Levora DredgeM Ghimire, MD  furosemide (LASIX) 80 MG tablet Take 2 tablets (160 mg total) by mouth 2 (two) times daily. 02/15/14   Meredeth IdeGagan S Lama, MD  hydrALAZINE (APRESOLINE) 100 MG tablet Take 100 mg by mouth every 8 (eight) hours.    Historical Provider, MD  isosorbide mononitrate (IMDUR) 30 MG 24 hr tablet Take 1 tablet (30 mg total) by mouth daily. 02/26/14   Lars MassonKatarina H Nelson, MD  metolazone (ZAROXOLYN) 5 MG tablet Take 1 tablet (5 mg total) by mouth daily. 02/15/14   Meredeth IdeGagan S Lama, MD   BP 221/147  Pulse 88  Temp(Src) 97.9 F (36.6 C) (Oral)  Resp 18 Physical Exam  Nursing  note and vitals reviewed. Constitutional: He is oriented to person, place, and time. He appears well-developed and well-nourished.  Non-toxic appearance. No distress.  HENT:  Head: Normocephalic and atraumatic.  Eyes: Conjunctivae, EOM and lids are normal. Pupils are equal, round, and reactive to light.  Neck: Normal range of motion. Neck supple. JVD present. No tracheal deviation present. No mass present.  Cardiovascular: Normal rate, regular rhythm and normal heart sounds.  Exam reveals no gallop.   No murmur heard. Pulmonary/Chest: Effort normal. No stridor. No respiratory distress. He has decreased breath sounds. He has no wheezes. He has rhonchi. He has no rales.  Abdominal: Soft. Normal  appearance and bowel sounds are normal. He exhibits no distension. There is no tenderness. There is no rebound and no CVA tenderness.  Genitourinary:  Edema to scrotum and penis noted  Musculoskeletal: Normal range of motion. He exhibits no edema and no tenderness.  4+ bilateral lower extremity pitting edema  Neurological: He is alert and oriented to person, place, and time. He has normal strength. No cranial nerve deficit or sensory deficit. GCS eye subscore is 4. GCS verbal subscore is 5. GCS motor subscore is 6.  Skin: Skin is warm and dry. No abrasion and no rash noted.  Psychiatric: He has a normal mood and affect. His speech is normal and behavior is normal.    ED Course  Procedures (including critical care time) Labs Review Labs Reviewed  PRO B NATRIURETIC PEPTIDE  CBC WITH DIFFERENTIAL  COMPREHENSIVE METABOLIC PANEL  I-STAT TROPOININ, ED    Imaging Review No results found.   EKG Interpretation   Date/Time:  Monday March 08 2014 12:22:52 EDT Ventricular Rate:  82 PR Interval:  196 QRS Duration: 100 QT Interval:  480 QTC Calculation: 560 R Axis:   -5 Text Interpretation:  Normal sinus rhythm Biatrial enlargement ST \\T \ T  wave abnormality, consider inferior ischemia Prolonged QT Abnormal ECG No  significant change since last tracing Confirmed by Eniola Cerullo  MD, Mikhail Hallenbeck  ([id number]) on 03/08/2014 1:03:33 PM      MDM   Final diagnoses:  None    Patient given 2 doses of hydralazine for his hypertension. He was placed on oxygen and feels better. Denies any chest pain at this time. We'll need to consult nephrology for dialysis. Spoke with internal medicine teaching service and patient to be admitted.  CRITICAL CARE Performed by: Toy Baker Total critical care time: 45 Critical care time was exclusive of separately billable procedures and treating other patients. Critical care was necessary to treat or prevent imminent or life-threatening deterioration. Critical care  was time spent personally by me on the following activities: development of treatment plan with patient and/or surrogate as well as nursing, discussions with consultants, evaluation of patient's response to treatment, examination of patient, obtaining history from patient or surrogate, ordering and performing treatments and interventions, ordering and review of laboratory studies, ordering and review of radiographic studies, pulse oximetry and re-evaluation of patient's condition.     Toy Baker, MD 03/08/14 1410

## 2014-03-09 DIAGNOSIS — R7402 Elevation of levels of lactic acid dehydrogenase (LDH): Secondary | ICD-10-CM

## 2014-03-09 DIAGNOSIS — Z9119 Patient's noncompliance with other medical treatment and regimen: Secondary | ICD-10-CM

## 2014-03-09 DIAGNOSIS — Z91199 Patient's noncompliance with other medical treatment and regimen due to unspecified reason: Secondary | ICD-10-CM

## 2014-03-09 DIAGNOSIS — R609 Edema, unspecified: Secondary | ICD-10-CM

## 2014-03-09 DIAGNOSIS — I10 Essential (primary) hypertension: Secondary | ICD-10-CM

## 2014-03-09 DIAGNOSIS — N186 End stage renal disease: Secondary | ICD-10-CM

## 2014-03-09 DIAGNOSIS — D649 Anemia, unspecified: Secondary | ICD-10-CM

## 2014-03-09 DIAGNOSIS — R740 Nonspecific elevation of levels of transaminase and lactic acid dehydrogenase [LDH]: Secondary | ICD-10-CM

## 2014-03-09 LAB — URINE DRUGS OF ABUSE SCREEN W ALC, ROUTINE (REF LAB)
AMPHETAMINE SCRN UR: NEGATIVE
BARBITURATE QUANT UR: NEGATIVE
Benzodiazepines.: NEGATIVE
COCAINE METABOLITES: NEGATIVE
Creatinine,U: 61.8 mg/dL
Ethyl Alcohol: <10 mg/dL (ref <10)
METHADONE: NEGATIVE
Marijuana Metabolite: NEGATIVE
Opiate Screen, Urine: NEGATIVE
PROPOXYPHENE: NEGATIVE
Phencyclidine (PCP): NEGATIVE

## 2014-03-09 LAB — HEPATITIS B CORE ANTIBODY, TOTAL: Hep B Core Total Ab: NONREACTIVE

## 2014-03-09 LAB — TROPONIN I: Troponin I: <0.3 ng/mL (ref <0.30)

## 2014-03-09 LAB — CBC
HCT: 28.2 % — ABNORMAL LOW (ref 39.0–52.0)
HEMOGLOBIN: 9.3 g/dL — AB (ref 13.0–17.0)
MCH: 28.8 pg (ref 26.0–34.0)
MCHC: 33 g/dL (ref 30.0–36.0)
MCV: 87.3 fL (ref 78.0–100.0)
Platelets: 158 10*3/uL (ref 150–400)
RBC: 3.23 MIL/uL — AB (ref 4.22–5.81)
RDW: 17.7 % — ABNORMAL HIGH (ref 11.5–15.5)
WBC: 6.2 10*3/uL (ref 4.0–10.5)

## 2014-03-09 LAB — HEPATITIS B SURFACE ANTIBODY,QUALITATIVE: Hep B S Ab: NEGATIVE

## 2014-03-09 LAB — BASIC METABOLIC PANEL
BUN: 90 mg/dL — ABNORMAL HIGH (ref 6–23)
CO2: 21 meq/L (ref 19–32)
Calcium: 7.2 mg/dL — ABNORMAL LOW (ref 8.4–10.5)
Chloride: 98 mEq/L (ref 96–112)
Creatinine, Ser: 7.35 mg/dL — ABNORMAL HIGH (ref 0.50–1.35)
GFR calc Af Amer: 10 mL/min — ABNORMAL LOW (ref >90)
GFR calc non Af Amer: 8 mL/min — ABNORMAL LOW (ref >90)
GLUCOSE: 97 mg/dL (ref 70–99)
POTASSIUM: 3.2 meq/L — AB (ref 3.7–5.3)
SODIUM: 139 meq/L (ref 137–147)

## 2014-03-09 LAB — HEPATITIS PANEL, ACUTE
HCV Ab: NEGATIVE
HEP A IGM: NONREACTIVE
Hep B C IgM: NONREACTIVE
Hepatitis B Surface Ag: NEGATIVE

## 2014-03-09 LAB — HIV ANTIBODY (ROUTINE TESTING W REFLEX): HIV 1&2 Ab, 4th Generation: NONREACTIVE

## 2014-03-09 MED ORDER — CLONIDINE HCL 0.2 MG PO TABS
0.2000 mg | ORAL_TABLET | Freq: Three times a day (TID) | ORAL | Status: DC
  Administered 2014-03-09 – 2014-03-11 (×6): 0.2 mg via ORAL
  Filled 2014-03-09 (×9): qty 1

## 2014-03-09 MED ORDER — HYDRALAZINE HCL 50 MG PO TABS
100.0000 mg | ORAL_TABLET | Freq: Once | ORAL | Status: AC
  Administered 2014-03-09: 100 mg via ORAL
  Filled 2014-03-09: qty 2

## 2014-03-09 MED ORDER — DOXERCALCIFEROL 4 MCG/2ML IV SOLN
2.0000 ug | INTRAVENOUS | Status: DC
  Administered 2014-03-09: 2 ug via INTRAVENOUS
  Filled 2014-03-09 (×2): qty 2

## 2014-03-09 MED ORDER — ALBUTEROL SULFATE (2.5 MG/3ML) 0.083% IN NEBU: 2.5000 mg | INHALATION_SOLUTION | RESPIRATORY_TRACT | Status: DC | PRN

## 2014-03-09 MED ORDER — MORPHINE SULFATE 2 MG/ML IJ SOLN
INTRAMUSCULAR | Status: AC
  Filled 2014-03-09: qty 1

## 2014-03-09 MED ORDER — AMLODIPINE BESYLATE 10 MG PO TABS
10.0000 mg | ORAL_TABLET | Freq: Every day | ORAL | Status: DC
  Filled 2014-03-09: qty 1

## 2014-03-09 MED ORDER — SEVELAMER CARBONATE 800 MG PO TABS
1600.0000 mg | ORAL_TABLET | Freq: Three times a day (TID) | ORAL | Status: DC
  Administered 2014-03-09 – 2014-03-15 (×15): 1600 mg via ORAL
  Filled 2014-03-09 (×27): qty 2

## 2014-03-09 MED ORDER — HEPARIN SODIUM (PORCINE) 1000 UNIT/ML DIALYSIS
20.0000 [IU]/kg | INTRAMUSCULAR | Status: DC | PRN
  Administered 2014-03-09 – 2014-03-10 (×2): 1700 [IU] via INTRAVENOUS_CENTRAL
  Filled 2014-03-09: qty 2

## 2014-03-09 MED ORDER — HYDRALAZINE HCL 50 MG PO TABS
100.0000 mg | ORAL_TABLET | Freq: Three times a day (TID) | ORAL | Status: DC
  Administered 2014-03-09 – 2014-03-11 (×5): 100 mg via ORAL
  Filled 2014-03-09 (×9): qty 2

## 2014-03-09 MED ORDER — DOXERCALCIFEROL 4 MCG/2ML IV SOLN
INTRAVENOUS | Status: AC
  Administered 2014-03-09: 2 ug via INTRAVENOUS
  Filled 2014-03-09: qty 2

## 2014-03-09 MED ORDER — AMLODIPINE BESYLATE 10 MG PO TABS
10.0000 mg | ORAL_TABLET | Freq: Every day | ORAL | Status: DC
  Administered 2014-03-09 – 2014-03-10 (×2): 10 mg via ORAL
  Filled 2014-03-09 (×3): qty 1

## 2014-03-09 NOTE — Progress Notes (Signed)
Patient ID: EARLEE AGE, male   DOB: [birth year], 38 y.o.   MRN: [id number]  [facility] Progress Note    Subjective:   Pt lethargic but arousable, breathing on room air. No complaints   Objective:   BP 160/109  Pulse 71  Temp(Src) 98 F (36.7 C) (Oral)  Resp 11  Ht 5\' 7"  (1.702 m)  Wt 84 kg (185 lb 3 oz)  BMI 29.00 kg/m2  SpO2 100%  Intake/Output: I/O last 3 completed shifts: In: 600 [P.O.:600] Out: 5260 [Urine:1260; Other:4000]   Intake/Output this shift:    Weight change:   Physical Exam: Gen:WD WN AAM in NAD CVS:no rub Resp:cta XIH:WTUUEK Ext:2+pitting edema, LAVF +T/B  Labs: BMET  Recent Labs Lab 03/08/14 1224 03/09/14 0408  NA 139 139  K 3.4* 3.2*  CL 95* 98  CO2 19 21  GLUCOSE 109* 97  BUN 110* 90*  CREATININE 8.35* 7.35*  ALBUMIN 2.6*  --   CALCIUM 7.1* 7.2*   CBC  Recent Labs Lab 03/08/14 1224 03/09/14 0408  WBC 7.2 6.2  NEUTROABS 5.7  --   HGB 9.6* 9.3*  HCT 28.7* 28.2*  MCV 87.0 87.3  PLT 198 158    @IMGRELPRIORS @ Medications:    . albuterol  2.5 mg Nebulization Q6H  . amLODipine  10 mg Oral QHS  . carvedilol  25 mg Oral BID WC  . cloNIDine  0.2 mg Oral TID  . heparin  5,000 Units Subcutaneous 3 times per day  . hydrALAZINE  100 mg Oral 3 times per day  . sodium chloride  3 mL Intravenous Q12H     Assessment/ Plan:   1. Urgent HTN- improved with UF and meds.  Cont with UF with HD and follow BP 2. ESRD: new start to HD.  Plan for HD again today and awaiting outpt placement 3. 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 Nutrition:per primary svc 6. Vascular access: LAVF +T/b 7. Dispo- not yet medically stable, will cont with UF and HD  Irena Cords 03/09/2014, 8:36 AM

## 2014-03-09 NOTE — Progress Notes (Signed)
Utilization Review Completed.Fia Hebert T Dowell4/21/2015  

## 2014-03-09 NOTE — Progress Notes (Signed)
[name] - Stepdown/ICU TEAM Progress Note  Matthew Beard ZOX:[id number] DOB: [birth year] DOA: 03/08/2014 PCP: [name],[name], MD  Admit HPI / Brief Narrative: Matthew Beard is a 38 y.o. BM PMHx CKD stage V and hypertension, who presents to the ED with the primary complaint of shortness of breath. Patient had fistula placed on his LUE in December 2014, anticipating hemodialysis. Patient was recently in the hospital, discharged about 3 weeks ago since then he started to have shortness of breath. He was sent to the Emergency room by his physician on 02/26/2014 but left before he was seen by the staff. He has had an difficult time getting around the house and lately becoming more dyspneic than normal. He complains of SOB, leg edema up to his waist, he does have orthopnea, PND but denies any substernal chest pain or palpitations.  In the ED CXR wasn't convincing for pulmonary edema, patient creatinine is 8.3 and BUN is 110. His proBNP is >70,000. Patient also has transaminitis and low albumin. Nephrology consulted for further evaluation.  HPI/Subjective: 4/21 this admission is patient's first HD session. Negative CP/SOB, positive anasarca (swollen scrotum and penis)  Assessment/Plan: Acute renal failure on CKD stage V  -CKD stage V with baseline creatinine of 7 to 7.5, presented with massive lower extremity edema.  -Presented with BUN of 110 and creatinine of 8.35, was not able to remove fluids with oral diuretics.  -4/21 patient to continue HD per nephrology. Will need  multiple sessions to resolve anasarca -Restrict fluid intake, renal diet.   Anasarca -See acute renal failure/CKD. stage V  Accelerated Hypertension  -Likey due the volume overload, blood pressure as high as 221/147.  -Patient BP will be monitored q 4 hours.  -restart amlodipine 10 mg daily  -Coreg 25 mg BID -Catapres 0.2 mg TID -Hydralazine 100 TID   Anemia  -Secondary to ESRD, management per nephrology.    Transaminitis  -Recent ultrasound showed biliary sludge.  -Cannot rule out congestive hepatopathy secondary to volume overload.  -Check hepatitis panel.  -INR= 1.22   Code Status: FULL Family Communication: no family present at time of exam Disposition Plan: Resolution anasarca    Consultants: Dr. Terrial RhodesJoseph Coladonato (nephrology)  Procedure/Significant Events:    Culture   Antibiotics:   DVT prophylaxis: Heparin TID   Devices    LINES / TUBES:  4/20 20Ga right forearm    Continuous Infusions:   Objective: VITAL SIGNS: Temp: 98 F (36.7 C) (04/21 0803) Temp src: Oral (04/21 0803) BP: 160/109 mmHg (04/21 0803) Pulse Rate: 71 (04/21 0803) FIO2:   Intake/Output Summary (Last 24 hours) at 03/09/14 0958 Last data filed at 03/09/14 0900  Gross per 24 hour  Intake   1380 ml  Output   5560 ml  Net  -4180 ml     Exam: General: A./O. x4, NAD  Lungs: Clear to auscultation bilaterally without wheezes or crackles Cardiovascular: Regular rate and rhythm, positive S3, negative gallop or rub normal S1 and S2 Renalbalance today; -4180ml       /overall;  -6464      Creatinine ; 7.35       Hourly output   Abdomen: Nontender, nondistended, soft, bowel sounds positive, no rebound, no ascites, no appreciable mass Extremities: No significant cyanosis, clubbing, or edema bilateral lower extremities  Data Reviewed: Basic Metabolic Panel:  Recent Labs Lab 03/08/14 1224 03/09/14 0408  NA 139 139  K 3.4* 3.2*  CL 95* 98  CO2 19 21  GLUCOSE  109* 97  BUN 110* 90*  CREATININE 8.35* 7.35*  CALCIUM 7.1* 7.2*   Liver Function Tests:  Recent Labs Lab 03/08/14 1224  AST 77*  ALT 247*  ALKPHOS 220*  BILITOT 0.5  PROT 7.4  ALBUMIN 2.6*   No results found for this basename: LIPASE, AMYLASE,  in the last 168 hours No results found for this basename: AMMONIA,  in the last 168 hours CBC:  Recent Labs Lab 03/08/14 1224 03/09/14 0408  WBC 7.2 6.2   NEUTROABS 5.7  --   HGB 9.6* 9.3*  HCT 28.7* 28.2*  MCV 87.0 87.3  PLT 198 158   Cardiac Enzymes:  Recent Labs Lab 03/08/14 1424 03/08/14 1630 03/08/14 2256 03/09/14 0408  TROPONINI <0.30 <0.30 <0.30 <0.30   BNP (last 3 results)  Recent Labs  07/04/13 0640 02/11/14 0800 03/08/14 1224  PROBNP 6732.0* >70000.0* >70000.0*   CBG: No results found for this basename: GLUCAP,  in the last 168 hours  Recent Results (from the past 240 hour(s))  MRSA PCR SCREENING     Status: None   Collection Time    03/08/14  4:35 PM      Result Value Ref Range Status   MRSA by PCR NEGATIVE  NEGATIVE Final   Comment:            The GeneXpert MRSA Assay (FDA     approved for NASAL specimens     only), is one component of a     comprehensive MRSA colonization     surveillance program. It is not     intended to diagnose MRSA     infection nor to guide or     monitor treatment for     MRSA infections.     Studies:  Recent x-ray studies have been reviewed in detail by the Attending Physician  Scheduled Meds:  Scheduled Meds: . amLODipine  10 mg Oral QHS  . carvedilol  25 mg Oral BID WC  . cloNIDine  0.2 mg Oral TID  . doxercalciferol  2 mcg Intravenous Q T,Th,Sa-HD  . heparin  5,000 Units Subcutaneous 3 times per day  . hydrALAZINE  100 mg Oral 3 times per day  . sevelamer carbonate  1,600 mg Oral TID WC  . sodium chloride  3 mL Intravenous Q12H    Time spent on care of this patient:   [name] , MD   [facility] Office  [phone] Pager [phone]  On-Call/Text Page:      [email or web address]      password [email or web address]  If 7PM-7AM, please contact night-coverage [email or web address] Password [email or web address] 03/09/2014, 9:58 AM   LOS: 1 day

## 2014-03-10 DIAGNOSIS — I5031 Acute diastolic (congestive) heart failure: Principal | ICD-10-CM | POA: Diagnosis present

## 2014-03-10 LAB — HEPATITIS PANEL, ACUTE
HCV Ab: NEGATIVE
HEP A IGM: NONREACTIVE
Hep B C IgM: NONREACTIVE
Hepatitis B Surface Ag: NEGATIVE

## 2014-03-10 LAB — URINE CULTURE
Colony Count: NO GROWTH
Culture: NO GROWTH

## 2014-03-10 MED ORDER — DARBEPOETIN ALFA-POLYSORBATE 100 MCG/0.5ML IJ SOLN
100.0000 ug | INTRAMUSCULAR | Status: DC
  Administered 2014-03-10: 100 ug via INTRAVENOUS
  Filled 2014-03-10: qty 0.5

## 2014-03-10 MED ORDER — DOXERCALCIFEROL 4 MCG/2ML IV SOLN
INTRAVENOUS | Status: AC
  Administered 2014-03-10: 2 ug
  Filled 2014-03-10: qty 2

## 2014-03-10 MED ORDER — DARBEPOETIN ALFA-POLYSORBATE 100 MCG/0.5ML IJ SOLN
INTRAMUSCULAR | Status: AC
  Administered 2014-03-10: 100 ug via INTRAVENOUS
  Filled 2014-03-10: qty 0.5

## 2014-03-10 NOTE — Clinical Documentation Improvement (Signed)
  Chart Notes state patient had "CHF" on presentation due to volume overload. Please document type and acuity to clarify severity of illness and risk of mortality. Thank you.  Possible Clinical Conditions?  Chronic Systolic Congestive Heart Failure Chronic Diastolic Congestive Heart Failure Chronic Systolic & Diastolic Congestive Heart Failure Acute Systolic Congestive Heart Failure Acute Diastolic Congestive Heart Failure Acute Systolic & Diastolic Congestive Heart Failure Acute on Chronic Systolic Congestive Heart Failure Acute on Chronic Diastolic Congestive Heart Failure Acute on Chronic Systolic & Diastolic Congestive Heart Failure Other Condition   Supporting Information: - Risk Factors: CKD V requiring HD initiation - Signs & Symptoms: dyspnea, volume overload, anasarca with edema of LE, scrotum and penis - Diagnostics: BNP 70,000 - Treatment: IV Lasix, HD initiation  Thank You, Beverley Fiedler ,RN Clinical Documentation Specialist:  [phone]  [facility]- Health Information Management

## 2014-03-10 NOTE — Progress Notes (Signed)
Pt made comment to me that "he doesn't know if he wants to continue to do dialysis or not",  I explained to him that he would need to talk to Dr. Arrie Aran about this.

## 2014-03-10 NOTE — Progress Notes (Signed)
[facility]  Matthew Beard ATF:[id number] DOB: 1976-11-17 DOA: 03/08/2014 PCP: [name], MD  Time spent :   Brief narrative: 38 y.o. M PMHx CKD stage V and hypertension, who presented to the ED with the primary complaint of shortness of breath. Patient had fistula placed on his LUE in December 2014, anticipating hemodialysis. Patient was recently in the hospital, discharged about 3 weeks ago since then he started to have shortness of breath. He was sent to the Emergency room by his physician on 02/26/2014 but left before he was seen by the staff. He has had an difficult time getting around the house and lately becoming more dyspneic than normal. He complains of SOB, leg edema up to his waist, he does have orthopnea, PND but denies any substernal chest pain or palpitations.   In the ED CXR wasn't convincing for pulmonary edema, patient creatinine was 8.3 and BUN was 110. His proBNP was >70,000. Patient also had transaminitis and low albumin. Nephrology consulted for further evaluation.  HPI/Subjective: Denies SOB - still having issues with scrotal edema.  No chest pain.    Assessment/Plan:  Acute respiratory failure with hypoxia due to: A) Acute diastolic heart failure B) Hypervolemia 2/2 need to initiate HD -off O2 post HD -control BP with meds and HD to prevent DHF exacerbations  Acute renal failure on CKD stage V  -tolerating HD well thus far -wt 195 lbs at admit and down to 179 today -also making urine in addition to HD UF with combined net negative balance of 6300 cc  Anasarca/Scrotal edema  -treatment as above  Accelerated Hypertension  -suspect will improve with ongoing HD -cont Norvasc, Coreg, Catapress and Apresoline but as gets closer to euvolemia may need to decrease or possibly dc these meds to avoid hypotension  Anemia  -Secondary to ESRD, management per nephrology.   Transaminitis  -Recent ultrasound showed  biliary sludge. No reported GI sx's so follow -Cannot rule out congestive hepatopathy secondary to volume overload.  -Hepatitis panel negative -INR= 1.22  DVT prophylaxis: Subcutaneous heparin Code Status: Full Family Communication: No family at bedside Disposition Plan/Expected LOS: Transfer to renal floor  Consultants: Nephrology  Procedures: None  Antibiotics: None  Objective: Blood pressure 147/101, pulse 73, temperature 98 F (36.7 C), temperature source Oral, resp. rate 19, height 5\' 7"  (1.702 m), weight 179 lb 3.7 oz (81.3 kg), SpO2 100.00%.  Intake/Output Summary (Last 24 hours) at 03/10/14 1356 Last data filed at 03/10/14 1103  Gross per 24 hour  Intake    980 ml  Output   3009 ml  Net  -2029 ml   Exam: General: No acute respiratory distress at rest  Lungs: Clear to auscultation bilaterally without wheezes or crackles, RA Cardiovascular: Regular rate and rhythm without murmur gallop or rub normal S1 and S2, significant 3+ pitting peripheral edema  Abdomen: Nontender, nondistended, soft, bowel sounds positive, no rebound, no ascites, no appreciable mass Genitourinary: Significant penile and scrotal edema Musculoskeletal: No significant cyanosis, clubbing of bilateral lower extremities Neurological: Alert and oriented x 3, moves all extremities x 4 without focal neurological deficits, CN 2-12 intact  Scheduled Meds:  Scheduled Meds: . amLODipine  10 mg Oral QHS  . carvedilol  25 mg Oral BID WC  . cloNIDine  0.2 mg Oral TID  . darbepoetin (ARANESP) injection - DIALYSIS  100 mcg Intravenous Q Wed-HD  . doxercalciferol  2 mcg Intravenous Q T,Th,Sa-HD  . heparin  5,000 Units Subcutaneous 3 times per day  . hydrALAZINE  100 mg Oral 3 times per day  . sevelamer carbonate  1,600 mg Oral TID WC  . sodium chloride  3 mL Intravenous Q12H   Data Reviewed: Basic Metabolic Panel:  Recent Labs Lab 03/08/14 1224 03/09/14 0408  NA 139 139  K 3.4* 3.2*  CL 95* 98    CO2 19 21  GLUCOSE 109* 97  BUN 110* 90*  CREATININE 8.35* 7.35*  CALCIUM 7.1* 7.2*   Liver Function Tests:  Recent Labs Lab 03/08/14 1224  AST 77*  ALT 247*  ALKPHOS 220*  BILITOT 0.5  PROT 7.4  ALBUMIN 2.6*   CBC:  Recent Labs Lab 03/08/14 1224 03/09/14 0408  WBC 7.2 6.2  NEUTROABS 5.7  --   HGB 9.6* 9.3*  HCT 28.7* 28.2*  MCV 87.0 87.3  PLT 198 158   Cardiac Enzymes:  Recent Labs Lab 03/08/14 1424 03/08/14 1630 03/08/14 2256 03/09/14 0408  TROPONINI <0.30 <0.30 <0.30 <0.30   BNP (last 3 results)  Recent Labs  07/04/13 0640 02/11/14 0800 03/08/14 1224  PROBNP 6732.0* >70000.0* >70000.0*   CBG: No results found for this basename: GLUCAP,  in the last 168 hours  Recent Results (from the past 240 hour(s))  MRSA PCR SCREENING     Status: None   Collection Time    03/08/14  4:35 PM      Result Value Ref Range Status   MRSA by PCR NEGATIVE  NEGATIVE Final   Comment:            The GeneXpert MRSA Assay (FDA     approved for NASAL specimens     only), is one component of a     comprehensive MRSA colonization     surveillance program. It is not     intended to diagnose MRSA     infection nor to guide or     monitor treatment for     MRSA infections.  URINE CULTURE     Status: None   Collection Time    03/08/14  9:08 PM      Result Value Ref Range Status   Specimen Description URINE, RANDOM   Final   Special Requests NONE   Final   Culture  Setup Time     Final   Value: 03/09/2014 04:20     [facility]formed at [facility]   [place] Count     Final   Value: NO GROWTH     [facility]formed at [facility]   Culture     Final   Value: NO GROWTH     Performed at [facility]   Report Status 03/10/2014 FINAL   Final     Studies:  Recent x-ray studies have been reviewed in detail by the Attending Physician       Junious SilkAllison Ellis, ANP [facility] Office  [phone] Pager [phone]  **If unable to reach the above  provider after paging please contact the Flow Manager @ [phone]  On-Call/Text Page:      [email or web address] [email or web address]      password [email or web address]  If 7PM-7AM, please contact night-coverage [email or web address] Password [email or web address] 03/10/2014, 1:56 PM   LOS: 2 days   I have personally examined this patient and reviewed the entire database. I have reviewed the above note, made any necessary editorial changes, and agree with its content.  Lonia BloodJeffrey T. McClung, MD [facility]

## 2014-03-10 NOTE — Progress Notes (Signed)
Patient ID: Matthew Beard, male   DOB: 1976-01-15, 38 y.o.   MRN: [id number]  [facility] Progress Note    Subjective:   Feels better   Objective:   BP 148/111  Pulse 68  Temp(Src) 97.3 F (36.3 C) (Oral)  Resp 17  Ht 5\' 7"  (1.702 m)  Wt 81.3 kg (179 lb 3.7 oz)  BMI 28.07 kg/m2  SpO2 99%  Intake/Output: I/O last 3 completed shifts: In: 1580 [P.O.:1580] Out: 7809 [Urine:1900; Other:5909]   Intake/Output this shift:  Total I/O In: 720 [P.O.:720] Out: -  Weight change: -3.6 kg (-7 lb 15 oz)  Physical Exam: Gen:WD WN AAM in NAD CVS:no rbu Resp:cta BXU:XYBFXO Ext:1-2+ lower ext edema (improved) RAVF +T/B  Labs: BMET  Recent Labs Lab 03/08/14 1224 03/09/14 0408  NA 139 139  K 3.4* 3.2*  CL 95* 98  CO2 19 21  GLUCOSE 109* 97  BUN 110* 90*  CREATININE 8.35* 7.35*  ALBUMIN 2.6*  --   CALCIUM 7.1* 7.2*   CBC  Recent Labs Lab 03/08/14 1224 03/09/14 0408  WBC 7.2 6.2  NEUTROABS 5.7  --   HGB 9.6* 9.3*  HCT 28.7* 28.2*  MCV 87.0 87.3  PLT 198 158    @IMGRELPRIORS @ Medications:    . amLODipine  10 mg Oral QHS  . carvedilol  25 mg Oral BID WC  . cloNIDine  0.2 mg Oral TID  . doxercalciferol  2 mcg Intravenous Q T,Th,Sa-HD  . heparin  5,000 Units Subcutaneous 3 times per day  . hydrALAZINE  100 mg Oral 3 times per day  . sevelamer carbonate  1,600 mg Oral TID WC  . sodium chloride  3 mL Intravenous Q12H     Assessment/ Plan:   1. CHF/volume excess- markedly improved with UF and dialysis 2. ESRD: new start to HD 03/08/14. S/p second HD yesterday.  For HD again today and awaiting outpt placement 3. 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  Need to check phos levels 5. Nutrition:per primary svc 6. Vascular access: LAVF +T/b 7. Dispo- not yet medically stable, will cont with UF and HD 8.   Matthew Beard Matthew Beard 03/10/2014, 9:59 AM

## 2014-03-11 LAB — CBC
HCT: 25.9 % — ABNORMAL LOW (ref 39.0–52.0)
Hemoglobin: 8.5 g/dL — ABNORMAL LOW (ref 13.0–17.0)
MCH: 29.3 pg (ref 26.0–34.0)
MCHC: 32.8 g/dL (ref 30.0–36.0)
MCV: 89.3 fL (ref 78.0–100.0)
PLATELETS: 37 10*3/uL — AB (ref 150–400)
RBC: 2.9 MIL/uL — AB (ref 4.22–5.81)
RDW: 19.7 % — ABNORMAL HIGH (ref 11.5–15.5)
WBC: 5 10*3/uL (ref 4.0–10.5)

## 2014-03-11 LAB — RENAL FUNCTION PANEL
ALBUMIN: 2.3 g/dL — AB (ref 3.5–5.2)
BUN: 52 mg/dL — ABNORMAL HIGH (ref 6–23)
CO2: 23 mEq/L (ref 19–32)
CREATININE: 4.98 mg/dL — AB (ref 0.50–1.35)
Calcium: 7.3 mg/dL — ABNORMAL LOW (ref 8.4–10.5)
Chloride: 97 mEq/L (ref 96–112)
GFR calc Af Amer: 16 mL/min — ABNORMAL LOW (ref >90)
GFR calc non Af Amer: 13 mL/min — ABNORMAL LOW (ref >90)
Glucose, Bld: 90 mg/dL (ref 70–99)
Phosphorus: 4.2 mg/dL (ref 2.3–4.6)
Potassium: 3.3 mEq/L — ABNORMAL LOW (ref 3.7–5.3)
Sodium: 138 mEq/L (ref 137–147)

## 2014-03-11 MED ORDER — AMLODIPINE BESYLATE 5 MG PO TABS
5.0000 mg | ORAL_TABLET | Freq: Two times a day (BID) | ORAL | Status: DC
  Administered 2014-03-11 – 2014-03-12 (×3): 5 mg via ORAL
  Filled 2014-03-11 (×5): qty 1

## 2014-03-11 MED ORDER — DOXERCALCIFEROL 4 MCG/2ML IV SOLN
2.0000 ug | Freq: Once | INTRAVENOUS | Status: AC
  Administered 2014-03-12: 2 ug via INTRAVENOUS
  Filled 2014-03-11: qty 2

## 2014-03-11 MED ORDER — DIPHENHYDRAMINE HCL 25 MG PO CAPS
25.0000 mg | ORAL_CAPSULE | ORAL | Status: DC | PRN
  Administered 2014-03-11 – 2014-03-15 (×3): 50 mg via ORAL
  Filled 2014-03-11 (×2): qty 2

## 2014-03-11 MED ORDER — DOXERCALCIFEROL 4 MCG/2ML IV SOLN
2.0000 ug | INTRAVENOUS | Status: DC
  Administered 2014-03-13 – 2014-03-15 (×2): 2 ug via INTRAVENOUS
  Filled 2014-03-11 (×2): qty 2

## 2014-03-11 NOTE — Progress Notes (Signed)
Pt brought to hemodialysis. Upper part of fistula was sightly swollen. Good thrill and bruit present. #17G needles inserted without problems. When BFR was increased arterial pressures were high and would not run. Needle was repositioned by 3 different nurses with pressures remaining high. Pt complaining of pain at venous needle and yelling out cuss words as loud as he could. Uncertain if pain was that extreme of if pt is extremely frustrated with getting hemodialysis as pt reacted the same way when needles were pulled and pressure was held to the sites.  Tx stopped and blood was recirculated. Fistula site now slightly swollen where venous needle was inserted. Dr. Eliott Nine called and informed of needles running poor, sites slightly swollen, and inablilty of 3 different nurses to get needles positioned well enough to run. Dr. Eliott Nine states to give the arm a rest tonight, apply ice and do Tx tomorrow. Needles were pulled, ice applied to sites and pt taken back to his room

## 2014-03-11 NOTE — Progress Notes (Signed)
Progress Note  Matthew Beard WUJ:[id number] DOB: 1976/06/25 DOA: 03/08/2014 PCP: [name],[name], MD  Time spent :  35mins  Brief narrative: 38 y.o. M PMHx CKD stage V and hypertension, who presented to the ED with the primary complaint of shortness of breath. Patient had fistula placed on his LUE in December 2014, anticipating hemodialysis. Patient was recently in the hospital, discharged about 3 weeks ago since then he started to have shortness of breath. He was sent to the Emergency room by his physician on 02/26/2014 but left before he was seen by the staff. He has had an difficult time getting around the house and lately becoming more dyspneic than normal. He complains of SOB, leg edema up to his waist, he does have orthopnea, PND but denies any substernal chest pain or palpitations.   In the ED CXR wasn't convincing for pulmonary edema, patient creatinine was 8.3 and BUN was 110. His proBNP was >70,000. Patient also had transaminitis and low albumin. Nephrology consulted for further evaluation.    HPI/Subjective:  Issues sitting in chair, no headache fever chills, no chest pain palpitations. No shortness of breath. No abdominal pain. No focal weakness. Swelling is going down.    Assessment/Plan:   Acute respiratory failure with hypoxia due to:  Acute diastolic heart failure caused by fluid overload in a patient with ESRD.  Resolved after dialysis, still has anasarca and significant edema in lower extremities, continue dialysis per renal, will discharge once outpatient dialysis treatments arranged. Shortness of breath has resolved.     ESRD. She did on dialysis this admission, renal following. Once outpatient dialysis arranged will be discharged.     Anasarca/Scrotal edema  -treatment as above    Accelerated Hypertension   -Blood pressure is improved, cont Norvasc, Coreg, Catapress and Apresoline he continued to monitor and adjust.    Anemia  -Secondary to ESRD,  management per nephrology.     Transaminitis   Likely due to hepatic congestion from fluid overload, acute hepatitis panel negative, right upper quadrant ultrasound equivocal. Does not have right upper quadrant pain or tenderness. Will monitor CMP and trend liver enzymes.    Thrombocytopenia  Noted on 03/11/2014, no bleeding, will remove heparin for DVT prophylaxis, too soon for HIT. We'll continue to monitor the trend.  Lab Results  Component Value Date   PLT 37* 03/11/2014        DVT prophylaxis: SCDs due to thrombocytopenia   Code Status: Full Family Communication: No family at bedside  Disposition Plan/Expected LOS: Home once outpatient dialysis is arranged.    Consultants: Nephrology  Procedures: None  Antibiotics: None  Objective: Blood pressure 126/87, pulse 67, temperature 98.4 F (36.9 C), temperature source Oral, resp. rate 18, height 5\' 7"  (1.702 m), weight 85.458 kg (188 lb 6.4 oz), SpO2 100.00%.  Intake/Output Summary (Last 24 hours) at 03/11/14 1003 Last data filed at 03/11/14 [id number]  Gross per 24 hour  Intake    840 ml  Output   3451 ml  Net  -2611 ml   Exam: General: No acute respiratory distress at rest  Lungs: Clear to auscultation bilaterally without wheezes or crackles, RA Cardiovascular: Regular rate and rhythm without murmur gallop or rub normal S1 and S2, significant 3+ pitting peripheral edema  Abdomen: Nontender, nondistended, soft, bowel sounds positive, no rebound, no ascites, no appreciable mass Genitourinary: Significant penile and scrotal edema Musculoskeletal: No significant cyanosis, clubbing of bilateral lower extremities Neurological: Alert and oriented x 3, moves all extremities  x 4 without focal neurological deficits, CN 2-12 intact  Scheduled Meds:  Scheduled Meds: . amLODipine  10 mg Oral QHS  . carvedilol  25 mg Oral BID WC  . cloNIDine  0.2 mg Oral TID  . darbepoetin (ARANESP) injection - DIALYSIS  100 mcg  Intravenous Q Wed-HD  . doxercalciferol  2 mcg Intravenous Q T,Th,Sa-HD  . hydrALAZINE  100 mg Oral 3 times per day  . sevelamer carbonate  1,600 mg Oral TID WC  . sodium chloride  3 mL Intravenous Q12H   Data Reviewed: Basic Metabolic Panel:  Recent Labs Lab 03/08/14 1224 03/09/14 0408 03/11/14 0500  NA 139 139 138  K 3.4* 3.2* 3.3*  CL 95* 98 97  CO2 19 21 23   GLUCOSE 109* 97 90  BUN 110* 90* 52*  CREATININE 8.35* 7.35* 4.98*  CALCIUM 7.1* 7.2* 7.3*  PHOS  --   --  4.2   Liver Function Tests:  Recent Labs Lab 03/08/14 1224 03/11/14 0500  AST 77*  --   ALT 247*  --   ALKPHOS 220*  --   BILITOT 0.5  --   PROT 7.4  --   ALBUMIN 2.6* 2.3*   CBC:  Recent Labs Lab 03/08/14 1224 03/09/14 0408 03/11/14 0554  WBC 7.2 6.2 5.0  NEUTROABS 5.7  --   --   HGB 9.6* 9.3* 8.5*  HCT 28.7* 28.2* 25.9*  MCV 87.0 87.3 89.3  PLT 198 158 37*   Cardiac Enzymes:  Recent Labs Lab 03/08/14 1424 03/08/14 1630 03/08/14 2256 03/09/14 0408  TROPONINI <0.30 <0.30 <0.30 <0.30   BNP (last 3 results)  Recent Labs  07/04/13 0640 02/11/14 0800 03/08/14 1224  PROBNP 6732.0* >70000.0* >70000.0*   CBG: No results found for this basename: GLUCAP,  in the last 168 hours  Recent Results (from the past 240 hour(s))  MRSA PCR SCREENING     Status: None   Collection Time    03/08/14  4:35 PM      Result Value Ref Range Status   MRSA by PCR NEGATIVE  NEGATIVE Final   Comment:            The GeneXpert MRSA Assay (FDA     approved for NASAL specimens     only), is one component of a     comprehensive MRSA colonization     surveillance program. It is not     intended to diagnose MRSA     infection nor to guide or     monitor treatment for     MRSA infections.  URINE CULTURE     Status: None   Collection Time    03/08/14  9:08 PM      Result Value Ref Range Status   Specimen Description URINE, RANDOM   Final   Special Requests NONE   Final   Culture  Setup Time     Final    Value: 03/09/2014 04:20     Performed at [facility] Count     Final   Value: NO GROWTH     Performed at [facility]   Culture     Final   Value: NO GROWTH     Performed at [facility]   Report Status 03/10/2014 FINAL   Final     Studies:  Recent x-ray studies have been reviewed in detail by the Attending Physician     LOS: 3 days    Victory Strollo Curlene Labrum  M.D on 03/11/2014 at 10:04 AM  Between 7am to 7pm - Pager - [phone], After 7pm go to [email or web address] - password [email or web address]  And look for the night coverage person covering me after hours  [facility]  Office  [phone]

## 2014-03-11 NOTE — Progress Notes (Signed)
Subjective: "Lots of swelling in my legs still and my scrotum". Has had HD x 3 with 9kg off total, weight is down from 89 to 85kg  Filed Vitals:   03/10/14 2247 03/10/14 2326 03/11/14 0429 03/11/14 0834  BP: 143/97 148/92 131/94 126/87  Pulse: 77 72 66 67  Temp: 97.9 F (36.6 C) 98.1 F (36.7 C) 98.4 F (36.9 C)   TempSrc: Oral Oral Oral   Resp: 16 17 17 18   Height:      Weight: 84.5 kg (186 lb 4.6 oz) 85.458 kg (188 lb 6.4 oz)    SpO2: 95% 98% 99% 100%   Exam: Tired adult AAM no distress Mild JVD Chest clear on R , dec'd L base Abd soft, NTND, no HSM 2+scrtoal edema 3+ bilat LE edema Neuro is nf, ox3   Assessment: 1 CKD V, now ESRD- new start to HD, 4th Rx today 2 Anasarca / vol excess- improving but still a ways to go 3 Anemia on aranesp 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, binders 5 Vasc access- has L forearm AVF working well w 17 ga needles   Plan- Daily HD for volume overload for now, CLIP in process, discussed salt and fluid restriction w pt and family    Vinson Moselle MD  pager [phone]    cell [phone]  03/11/2014, 1:05 PM     Recent Labs Lab 03/08/14 1224 03/09/14 0408 03/11/14 0500  NA 139 139 138  K 3.4* 3.2* 3.3*  CL 95* 98 97  CO2 19 21 23   GLUCOSE 109* 97 90  BUN 110* 90* 52*  CREATININE 8.35* 7.35* 4.98*  CALCIUM 7.1* 7.2* 7.3*  PHOS  --   --  4.2    Recent Labs Lab 03/08/14 1224 03/11/14 0500  AST 77*  --   ALT 247*  --   ALKPHOS 220*  --   BILITOT 0.5  --   PROT 7.4  --   ALBUMIN 2.6* 2.3*    Recent Labs Lab 03/08/14 1224 03/09/14 0408 03/11/14 0554  WBC 7.2 6.2 5.0  NEUTROABS 5.7  --   --   HGB 9.6* 9.3* 8.5*  HCT 28.7* 28.2* 25.9*  MCV 87.0 87.3 89.3  PLT 198 158 37*   . amLODipine  10 mg Oral QHS  . carvedilol  25 mg Oral BID WC  . cloNIDine  0.2 mg Oral TID  . darbepoetin (ARANESP) injection - DIALYSIS  100 mcg Intravenous Q Wed-HD  . doxercalciferol  2 mcg Intravenous Q T,Th,Sa-HD  . hydrALAZINE  100 mg Oral 3 times per day  .  sevelamer carbonate  1,600 mg Oral TID WC  . sodium chloride  3 mL Intravenous Q12H     acetaminophen, acetaminophen, albuterol, heparin, HYDROcodone-acetaminophen, morphine injection, ondansetron (ZOFRAN) IV, ondansetron

## 2014-03-12 LAB — COMPREHENSIVE METABOLIC PANEL
ALBUMIN: 2.6 g/dL — AB (ref 3.5–5.2)
ALK PHOS: 129 U/L — AB (ref 39–117)
ALT: 114 U/L — ABNORMAL HIGH (ref 0–53)
AST: 32 U/L (ref 0–37)
BUN: 56 mg/dL — ABNORMAL HIGH (ref 6–23)
CO2: 21 mEq/L (ref 19–32)
Calcium: 7.9 mg/dL — ABNORMAL LOW (ref 8.4–10.5)
Chloride: 102 mEq/L (ref 96–112)
Creatinine, Ser: 5.66 mg/dL — ABNORMAL HIGH (ref 0.50–1.35)
GFR calc Af Amer: 13 mL/min — ABNORMAL LOW (ref >90)
GFR calc non Af Amer: 12 mL/min — ABNORMAL LOW (ref >90)
Glucose, Bld: 92 mg/dL (ref 70–99)
POTASSIUM: 3.1 meq/L — AB (ref 3.7–5.3)
Sodium: 140 mEq/L (ref 137–147)
TOTAL PROTEIN: 7.1 g/dL (ref 6.0–8.3)
Total Bilirubin: 0.3 mg/dL (ref 0.3–1.2)

## 2014-03-12 LAB — CBC
HEMATOCRIT: 25.3 % — AB (ref 39.0–52.0)
Hemoglobin: 8.1 g/dL — ABNORMAL LOW (ref 13.0–17.0)
MCH: 29.7 pg (ref 26.0–34.0)
MCHC: 32 g/dL (ref 30.0–36.0)
MCV: 92.7 fL (ref 78.0–100.0)
Platelets: 55 10*3/uL — ABNORMAL LOW (ref 150–400)
RBC: 2.73 MIL/uL — AB (ref 4.22–5.81)
RDW: 20.4 % — ABNORMAL HIGH (ref 11.5–15.5)
WBC: 7.2 10*3/uL (ref 4.0–10.5)

## 2014-03-12 MED ORDER — DOXERCALCIFEROL 4 MCG/2ML IV SOLN
INTRAVENOUS | Status: AC
  Filled 2014-03-12: qty 2

## 2014-03-12 MED ORDER — LIDOCAINE HCL (PF) 1 % IJ SOLN: 5.0000 mL | INTRAMUSCULAR | Status: DC | PRN

## 2014-03-12 MED ORDER — HEPARIN SODIUM (PORCINE) 1000 UNIT/ML DIALYSIS
2000.0000 [IU] | INTRAMUSCULAR | Status: DC | PRN
  Administered 2014-03-12: 2000 [IU] via INTRAVENOUS_CENTRAL

## 2014-03-12 MED ORDER — LIDOCAINE-PRILOCAINE 2.5-2.5 % EX CREA: 1.0000 "application " | TOPICAL_CREAM | CUTANEOUS | Status: DC | PRN

## 2014-03-12 MED ORDER — PENTAFLUOROPROP-TETRAFLUOROETH EX AERO: 1.0000 "application " | INHALATION_SPRAY | CUTANEOUS | Status: DC | PRN

## 2014-03-12 MED ORDER — HYDRALAZINE HCL 50 MG PO TABS
75.0000 mg | ORAL_TABLET | Freq: Three times a day (TID) | ORAL | Status: DC
  Filled 2014-03-12 (×3): qty 1

## 2014-03-12 MED ORDER — HYDRALAZINE HCL 50 MG PO TABS
75.0000 mg | ORAL_TABLET | Freq: Three times a day (TID) | ORAL | Status: DC
  Administered 2014-03-12 – 2014-03-13 (×3): 75 mg via ORAL
  Filled 2014-03-12 (×6): qty 1

## 2014-03-12 MED ORDER — HEPARIN SODIUM (PORCINE) 1000 UNIT/ML DIALYSIS: 1000.0000 [IU] | INTRAMUSCULAR | Status: DC | PRN

## 2014-03-12 MED ORDER — NEPRO/CARBSTEADY PO LIQD: 237.0000 mL | ORAL | Status: DC | PRN

## 2014-03-12 MED ORDER — ALTEPLASE 2 MG IJ SOLR
2.0000 mg | Freq: Once | INTRAMUSCULAR | Status: DC | PRN
  Filled 2014-03-12: qty 2

## 2014-03-12 MED ORDER — SODIUM CHLORIDE 0.9 % IV SOLN: 100.0000 mL | INTRAVENOUS | Status: DC | PRN

## 2014-03-12 MED ORDER — LIDOCAINE-PRILOCAINE 2.5-2.5 % EX CREA
1.0000 "application " | TOPICAL_CREAM | CUTANEOUS | Status: DC | PRN
  Filled 2014-03-12: qty 5

## 2014-03-12 MED ORDER — HEPARIN SODIUM (PORCINE) 1000 UNIT/ML DIALYSIS: 2500.0000 [IU] | INTRAMUSCULAR | Status: DC | PRN

## 2014-03-12 MED ORDER — ALTEPLASE 2 MG IJ SOLR: 2.0000 mg | Freq: Once | INTRAMUSCULAR | Status: DC | PRN

## 2014-03-12 MED ORDER — ZOLPIDEM TARTRATE 5 MG PO TABS
5.0000 mg | ORAL_TABLET | Freq: Once | ORAL | Status: AC
  Administered 2014-03-12: 5 mg via ORAL
  Filled 2014-03-12: qty 1

## 2014-03-12 MED ORDER — NEPRO/CARBSTEADY PO LIQD
237.0000 mL | ORAL | Status: DC | PRN
  Filled 2014-03-12: qty 237

## 2014-03-12 MED ORDER — HEPARIN SODIUM (PORCINE) 1000 UNIT/ML DIALYSIS: 2000.0000 [IU] | INTRAMUSCULAR | Status: DC | PRN

## 2014-03-12 NOTE — Procedures (Signed)
I was present at this dialysis session, have reviewed the session itself and made  appropriate changes  Vinson Moselle MD (pgr) [phone]    ([phone] 03/12/2014, 11:32 AM

## 2014-03-12 NOTE — Progress Notes (Signed)
Subjective: Could not use AVF yesterday, too much pain . Pt says it was no problem the first 3 treatments. Iced overnight  Filed Vitals:   03/11/14 1900 03/12/14 0359 03/12/14 0517 03/12/14 0909  BP: 150/102  169/114 196/121  Pulse: 73 88 79 82  Temp: 98 F (36.7 C) 98.4 F (36.9 C)  97.9 F (36.6 C)  TempSrc:    Oral  Resp: 19 21  20   Height:      Weight: 86.183 kg (190 lb)     SpO2: 96% 94%  96%   Exam: Tired adult AAM no distress Mild JVD Chest mild rales R base, L clear Abd soft, NTND, no HSM 2+scrotal edema 3+ bilat LE edema Neuro is nf, ox3  ECHO 3/15 EF 50%, severe LVH, normal RV  Assessment: 1 CKD V, now ESRD due to HTN- new start to HD 2 Anasarca / vol excess- still 20-30 lbs fluid on 3 Anemia on aranesp 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  Need to get volume down before sending home, anticipate early to mid next week.     Vinson Moselle MD  pager [phone]    cell [phone]  03/12/2014, 9:41 AM     Recent Labs Lab 03/08/14 1224 03/09/14 0408 03/11/14 0500  NA 139 139 138  K 3.4* 3.2* 3.3*  CL 95* 98 97  CO2 19 21 23   GLUCOSE 109* 97 90  BUN 110* 90* 52*  CREATININE 8.35* 7.35* 4.98*  CALCIUM 7.1* 7.2* 7.3*  PHOS  --   --  4.2    Recent Labs Lab 03/08/14 1224 03/11/14 0500  AST 77*  --   ALT 247*  --   ALKPHOS 220*  --   BILITOT 0.5  --   PROT 7.4  --   ALBUMIN 2.6* 2.3*    Recent Labs Lab 03/08/14 1224 03/09/14 0408 03/11/14 0554  WBC 7.2 6.2 5.0  NEUTROABS 5.7  --   --   HGB 9.6* 9.3* 8.5*  HCT 28.7* 28.2* 25.9*  MCV 87.0 87.3 89.3  PLT 198 158 37*   . amLODipine  5 mg Oral BID  . carvedilol  25 mg Oral BID WC  . darbepoetin (ARANESP) injection - DIALYSIS  100 mcg Intravenous Q Wed-HD  . doxercalciferol  2  mcg Intravenous Q T,Th,Sa-HD  . doxercalciferol  2 mcg Intravenous Once  . sevelamer carbonate  1,600 mg Oral TID WC  . sodium chloride  3 mL Intravenous Q12H     acetaminophen, acetaminophen, albuterol, diphenhydrAMINE, heparin, HYDROcodone-acetaminophen, morphine injection, ondansetron (ZOFRAN) IV, ondansetron

## 2014-03-12 NOTE — Progress Notes (Signed)
Utilization review completed.  

## 2014-03-12 NOTE — Progress Notes (Signed)
Progress Note  Matthew Beard VOZ:[id number] DOB: [birth year] DOA: 03/08/2014 PCP: [name], MD  Time spent :    Brief narrative:  38 y.o. M PMHx CKD stage V and hypertension, who presented to the ED with the primary complaint of shortness of breath. Patient had fistula placed on his LUE in December 2014, anticipating hemodialysis. Patient was recently in the hospital, discharged about 3 weeks ago since then he started to have shortness of breath. He was sent to the Emergency room by his physician on 02/26/2014 but left before he was seen by the staff. He has had an difficult time getting around the house and lately becoming more dyspneic than normal. He complains of SOB, leg edema up to his waist, he does have orthopnea, PND but denies any substernal chest pain or palpitations.   In the ED CXR wasn't convincing for pulmonary edema, patient creatinine was 8.3 and BUN was 110. His proBNP was >70,000. Patient also had transaminitis and low albumin. Nephrology consulted for further evaluation.    HPI/Subjective:  Issues sitting in chair, no headache fever chills, no chest pain palpitations. No shortness of breath. No abdominal pain. No focal weakness. Swelling is going down.    Assessment/Plan:   Acute respiratory failure with hypoxia due to:  Acute diastolic heart failure caused by fluid overload in a patient with ESRD.  Resolved after dialysis, still has anasarca and significant edema in lower extremities, continue dialysis per renal, will discharge once outpatient dialysis treatments arranged. Shortness of breath has resolved.     ESRD. She did on dialysis this admission, renal following. Once outpatient dialysis arranged will be discharged.     Anasarca/Scrotal edema  -treatment as above    Accelerated Hypertension   -Blood pressure is improved, cont Norvasc, Coreg, Catapress aadded hydralazine, continued to monitor and adjust.    Anemia  -Secondary to  ESRD, management per nephrology.     Transaminitis   Likely due to hepatic congestion from fluid overload, acute hepatitis panel negative, right upper quadrant ultrasound equivocal. Does not have right upper quadrant pain or tenderness. Will monitor CMP and trend liver enzymes.    Thrombocytopenia  Noted on 03/11/2014, no bleeding, will remove heparin for DVT prophylaxis, too soon for HIT. We'll continue to monitor the trend.todays CBC pending.  Lab Results  Component Value Date   PLT 37* 03/11/2014        DVT prophylaxis: SCDs due to thrombocytopenia   Code Status: Full Family Communication: No family at bedside  Disposition Plan/Expected LOS: Home once outpatient dialysis is arranged.    Consultants: Nephrology  Procedures: None  Antibiotics: None  Objective: Blood pressure 196/121, pulse 82, temperature 97.9 F (36.6 C), temperature source Oral, resp. rate 20, height 5\' 7"  (1.702 m), weight 86.183 kg (190 lb), SpO2 96.00%.  Intake/Output Summary (Last 24 hours) at 03/12/14 1018 Last data filed at 03/12/14 0900  Gross per 24 hour  Intake    700 ml  Output      0 ml  Net    700 ml   Exam: General: No acute respiratory distress at rest  Lungs: Clear to auscultation bilaterally without wheezes or crackles, RA Cardiovascular: Regular rate and rhythm without murmur gallop or rub normal S1 and S2, significant 3+ pitting peripheral edema  Abdomen: Nontender, nondistended, soft, bowel sounds positive, no rebound, no ascites, no appreciable mass Genitourinary: Significant penile and scrotal edema Musculoskeletal: No significant cyanosis, clubbing of bilateral lower extremities Neurological: Alert and  oriented x 3, moves all extremities x 4 without focal neurological deficits, CN 2-12 intact  Scheduled Meds:  Scheduled Meds: . amLODipine  5 mg Oral BID  . carvedilol  25 mg Oral BID WC  . darbepoetin (ARANESP) injection - DIALYSIS  100 mcg Intravenous Q Wed-HD    . doxercalciferol  2 mcg Intravenous Q T,Th,Sa-HD  . doxercalciferol  2 mcg Intravenous Once  . hydrALAZINE  75 mg Oral 3 times per day  . sevelamer carbonate  1,600 mg Oral TID WC  . sodium chloride  3 mL Intravenous Q12H   Data Reviewed: Basic Metabolic Panel:  Recent Labs Lab 03/08/14 1224 03/09/14 0408 03/11/14 0500  NA 139 139 138  K 3.4* 3.2* 3.3*  CL 95* 98 97  CO2 19 21 23   GLUCOSE 109* 97 90  BUN 110* 90* 52*  CREATININE 8.35* 7.35* 4.98*  CALCIUM 7.1* 7.2* 7.3*  PHOS  --   --  4.2   Liver Function Tests:  Recent Labs Lab 03/08/14 1224 03/11/14 0500  AST 77*  --   ALT 247*  --   ALKPHOS 220*  --   BILITOT 0.5  --   PROT 7.4  --   ALBUMIN 2.6* 2.3*   CBC:  Recent Labs Lab 03/08/14 1224 03/09/14 0408 03/11/14 0554  WBC 7.2 6.2 5.0  NEUTROABS 5.7  --   --   HGB 9.6* 9.3* 8.5*  HCT 28.7* 28.2* 25.9*  MCV 87.0 87.3 89.3  PLT 198 158 37*   Cardiac Enzymes:  Recent Labs Lab 03/08/14 1424 03/08/14 1630 03/08/14 2256 03/09/14 0408  TROPONINI <0.30 <0.30 <0.30 <0.30   BNP (last 3 results)  Recent Labs  07/04/13 0640 02/11/14 0800 03/08/14 1224  PROBNP 6732.0* >70000.0* >70000.0*   CBG: No results found for this basename: GLUCAP,  in the last 168 hours  Recent Results (from the past 240 hour(s))  MRSA PCR SCREENING     Status: None   Collection Time    03/08/14  4:35 PM      Result Value Ref Range Status   MRSA by PCR NEGATIVE  NEGATIVE Final   Comment:            The GeneXpert MRSA Assay (FDA     approved for NASAL specimens     only), is one component of a     comprehensive MRSA colonization     surveillance program. It is not     intended to diagnose MRSA     infection nor to guide or     monitor treatment for     MRSA infections.  URINE CULTURE     Status: None   Collection Time    03/08/14  9:08 PM      Result Value Ref Range Status   Specimen Description URINE, RANDOM   Final   Special Requests NONE   Final   Culture   Setup Time     Final   Value: 03/09/2014 04:20     Performed at [facility]   [place] Count     Final   Value: NO GROWTH     Performed at [facility]   Culture     Final   Value: NO GROWTH     Performed at [facility]   Report Status 03/10/2014 FINAL   Final     Studies:  Recent x-ray studies have been reviewed in detail by the Attending Physician     LOS: 4  days    Leroy Sea M.D on 03/12/2014 at 10:18 AM  Between 7am to 7pm - Pager - [phone], After 7pm go to [email or web address] - password [email or web address]  And look for the night coverage person covering me after hours  [facility]  Office  [phone]

## 2014-03-13 LAB — COMPREHENSIVE METABOLIC PANEL
ALT: 106 U/L — ABNORMAL HIGH (ref 0–53)
AST: 33 U/L (ref 0–37)
Albumin: 2.9 g/dL — ABNORMAL LOW (ref 3.5–5.2)
Alkaline Phosphatase: 134 U/L — ABNORMAL HIGH (ref 39–117)
BILIRUBIN TOTAL: 0.3 mg/dL (ref 0.3–1.2)
BUN: 44 mg/dL — AB (ref 6–23)
CHLORIDE: 100 meq/L (ref 96–112)
CO2: 23 mEq/L (ref 19–32)
CREATININE: 4.78 mg/dL — AB (ref 0.50–1.35)
Calcium: 8.2 mg/dL — ABNORMAL LOW (ref 8.4–10.5)
GFR calc non Af Amer: 14 mL/min — ABNORMAL LOW (ref >90)
GFR, EST AFRICAN AMERICAN: 16 mL/min — AB (ref >90)
Glucose, Bld: 85 mg/dL (ref 70–99)
Potassium: 3.7 mEq/L (ref 3.7–5.3)
Sodium: 140 mEq/L (ref 137–147)
Total Protein: 7.7 g/dL (ref 6.0–8.3)

## 2014-03-13 LAB — CBC
HCT: 26.8 % — ABNORMAL LOW (ref 39.0–52.0)
Hemoglobin: 8.3 g/dL — ABNORMAL LOW (ref 13.0–17.0)
MCH: 29.5 pg (ref 26.0–34.0)
MCHC: 31 g/dL (ref 30.0–36.0)
MCV: 95.4 fL (ref 78.0–100.0)
PLATELETS: 39 10*3/uL — AB (ref 150–400)
RBC: 2.81 MIL/uL — ABNORMAL LOW (ref 4.22–5.81)
RDW: 20.9 % — ABNORMAL HIGH (ref 11.5–15.5)
WBC: 8.3 10*3/uL (ref 4.0–10.5)

## 2014-03-13 MED ORDER — DOXERCALCIFEROL 4 MCG/2ML IV SOLN
INTRAVENOUS | Status: AC
  Filled 2014-03-13: qty 2

## 2014-03-13 MED ORDER — HYDRALAZINE HCL 50 MG PO TABS
100.0000 mg | ORAL_TABLET | Freq: Three times a day (TID) | ORAL | Status: DC
Start: 2014-03-13 — End: 2014-03-14
  Administered 2014-03-13 – 2014-03-14 (×3): 100 mg via ORAL
  Filled 2014-03-13 (×6): qty 2

## 2014-03-13 MED ORDER — AMLODIPINE BESYLATE 10 MG PO TABS
10.0000 mg | ORAL_TABLET | Freq: Two times a day (BID) | ORAL | Status: DC
Start: 2014-03-13 — End: 2014-03-16
  Administered 2014-03-13 – 2014-03-16 (×6): 10 mg via ORAL
  Filled 2014-03-13 (×8): qty 1

## 2014-03-13 NOTE — Procedures (Signed)
I was present at this dialysis session, have reviewed the session itself and made  appropriate changes  Vinson Moselle MD (pgr) [phone]    ([phone] 03/13/2014, 1:25 PM

## 2014-03-13 NOTE — Progress Notes (Signed)
Progress Note  Matthew Beard BJY:[id number] DOB: [birth year] DOA: 03/08/2014 PCP: [name],[name], MD  Time spent :  35mins   Brief narrative:  38 y.o. M PMHx CKD stage V and hypertension, who presented to the ED with the primary complaint of shortness of breath. Patient had fistula placed on his LUE in December 2014, anticipating hemodialysis. Patient was recently in the hospital, discharged about 3 weeks ago since then he started to have shortness of breath. He was sent to the Emergency room by his physician on 02/26/2014 but left before he was seen by the staff. He has had an difficult time getting around the house and lately becoming more dyspneic than normal. He complains of SOB, leg edema up to his waist, he does have orthopnea, PND but denies any substernal chest pain or palpitations.   In the ED CXR wasn't convincing for pulmonary edema, patient creatinine was 8.3 and BUN was 110. His proBNP was >70,000. Patient also had transaminitis and low albumin. Nephrology consulted for further evaluation.    HPI/Subjective:  Issues sitting in chair, no headache fever chills, no chest pain palpitations. No shortness of breath. No abdominal pain. No focal weakness. Swelling is going down.    Assessment/Plan:   Acute respiratory failure with hypoxia due to:  Acute diastolic heart failure caused by fluid overload in a patient with ESRD.  Resolved after dialysis, still has anasarca and significant edema in lower extremities, continue dialysis per renal, will discharge once outpatient dialysis treatments arranged. Shortness of breath has resolved.     ESRD. She did on dialysis this admission, renal following. Once outpatient dialysis arranged will be discharged.     Anasarca/Scrotal edema  -treatment as above    Accelerated Hypertension   -Blood pressure is improved, increased his Norvasc and hydralazine dose continue present dose Coreg along with Catapres continue to monitor  and adjust.    Anemia  -Secondary to ESRD, management per nephrology.     Transaminitis   Likely due to hepatic congestion from fluid overload, acute hepatitis panel negative, right upper quadrant ultrasound equivocal. Does not have right upper quadrant pain or tenderness. Will monitor CMP and trend liver enzymes.    Thrombocytopenia  Noted on 03/11/2014, no bleeding, will remove heparin for DVT prophylaxis, too soon for HIT. We'll continue to monitor the trend.todays CBC pending.  Lab Results  Component Value Date   PLT 39* 03/13/2014        DVT prophylaxis: SCDs due to thrombocytopenia   Code Status: Full Family Communication: No family at bedside  Disposition Plan/Expected LOS: Home once outpatient dialysis is arranged.    Consultants: Nephrology  Procedures: None  Antibiotics: None  Objective: Blood pressure 182/110, pulse 92, temperature 98.9 F (37.2 C), temperature source Oral, resp. rate 19, height 5\' 7"  (1.702 m), weight 83.7 kg (184 lb 8.4 oz), SpO2 95.00%.  Intake/Output Summary (Last 24 hours) at 03/13/14 1035 Last data filed at 03/13/14 0550  Gross per 24 hour  Intake    840 ml  Output   4000 ml  Net  -3160 ml   Exam: General: No acute respiratory distress at rest  Lungs: Clear to auscultation bilaterally without wheezes or crackles, RA Cardiovascular: Regular rate and rhythm without murmur gallop or rub normal S1 and S2, significant 3+ pitting peripheral edema  Abdomen: Nontender, nondistended, soft, bowel sounds positive, no rebound, no ascites, no appreciable mass Genitourinary: Significant penile and scrotal edema Musculoskeletal: No significant cyanosis, clubbing of bilateral lower  extremities Neurological: Alert and oriented x 3, moves all extremities x 4 without focal neurological deficits, CN 2-12 intact  Scheduled Meds:  Scheduled Meds: . amLODipine  10 mg Oral BID  . carvedilol  25 mg Oral BID WC  . darbepoetin (ARANESP)  injection - DIALYSIS  100 mcg Intravenous Q Wed-HD  . doxercalciferol  2 mcg Intravenous Q T,Th,Sa-HD  . hydrALAZINE  100 mg Oral 3 times per day  . sevelamer carbonate  1,600 mg Oral TID WC  . sodium chloride  3 mL Intravenous Q12H   Data Reviewed: Basic Metabolic Panel:  Recent Labs Lab 03/08/14 1224 03/09/14 0408 03/11/14 0500 03/12/14 1045 03/13/14 0655  NA 139 139 138 140 140  K 3.4* 3.2* 3.3* 3.1* 3.7  CL 95* 98 97 102 100  CO2 19 21 23 21 23   GLUCOSE 109* 97 90 92 85  BUN 110* 90* 52* 56* 44*  CREATININE 8.35* 7.35* 4.98* 5.66* 4.78*  CALCIUM 7.1* 7.2* 7.3* 7.9* 8.2*  PHOS  --   --  4.2  --   --    Liver Function Tests:  Recent Labs Lab 03/08/14 1224 03/11/14 0500 03/12/14 1045 03/13/14 0655  AST 77*  --  32 33  ALT 247*  --  114* 106*  ALKPHOS 220*  --  129* 134*  BILITOT 0.5  --  0.3 0.3  PROT 7.4  --  7.1 7.7  ALBUMIN 2.6* 2.3* 2.6* 2.9*   CBC:  Recent Labs Lab 03/08/14 1224 03/09/14 0408 03/11/14 0554 03/12/14 1045 03/13/14 0655  WBC 7.2 6.2 5.0 7.2 8.3  NEUTROABS 5.7  --   --   --   --   HGB 9.6* 9.3* 8.5* 8.1* 8.3*  HCT 28.7* 28.2* 25.9* 25.3* 26.8*  MCV 87.0 87.3 89.3 92.7 95.4  PLT 198 158 37* 55* 39*   Cardiac Enzymes:  Recent Labs Lab 03/08/14 1424 03/08/14 1630 03/08/14 2256 03/09/14 0408  TROPONINI <0.30 <0.30 <0.30 <0.30   BNP (last 3 results)  Recent Labs  07/04/13 0640 02/11/14 0800 03/08/14 1224  PROBNP 6732.0* >70000.0* >70000.0*   CBG: No results found for this basename: GLUCAP,  in the last 168 hours  Recent Results (from the past 240 hour(s))  MRSA PCR SCREENING     Status: None   Collection Time    03/08/14  4:35 PM      Result Value Ref Range Status   MRSA by PCR NEGATIVE  NEGATIVE Final   Comment:            The GeneXpert MRSA Assay (FDA     approved for NASAL specimens     only), is one component of a     comprehensive MRSA colonization     surveillance program. It is not     intended to diagnose  MRSA     infection nor to guide or     monitor treatment for     MRSA infections.  URINE CULTURE     Status: None   Collection Time    03/08/14  9:08 PM      Result Value Ref Range Status   Specimen Description URINE, RANDOM   Final   Special Requests NONE   Final   Culture  Setup Time     Final   Value: 03/09/2014 04:20     Performed at [facility] Count     Final   Value: NO GROWTH     Performed at [facility]   Culture     Final   Value: NO GROWTH     Performed at [facility]   Report Status 03/10/2014 FINAL   Final     Studies:  Recent x-ray studies have been reviewed in detail by the Attending Physician     LOS: 5 days    Leroy Sea M.D on 03/13/2014 at 10:35 AM  Between 7am to 7pm - Pager - [phone], After 7pm go to [email or web address] - password [email or web address]  And look for the night coverage person covering me after hours  [facility]  Office  [phone]

## 2014-03-13 NOTE — Progress Notes (Signed)
Subjective:  On hd no cos/ using AVF today with out prob.on hd Objective Vital signs in last 24 hours: Filed Vitals:   03/13/14 1130 03/13/14 1138 03/13/14 1200 03/13/14 1230  BP: 168/105 160/103 135/89 133/82  Pulse: 89 88 93 87  Temp: 98.3 F (36.8 C)     TempSrc: Oral     Resp: 18 18 18    Height:      Weight: 83.3 kg (183 lb 10.3 oz)     SpO2: 97%     Exam:   General= watching TV on HD /alert/nad adult AAM Mild JVD  Lungs= CTA bilat. No rales or rhonchi Abd= BS +, soft, NTND, no HSM  Extrem.= 3 + bipedal edema HD access= L FA AVF patent on hd  Assessment:  1 ESRD due to HTN- new start to HD / awaiting clip process 2 Anasarca / vol excess-Tolerating still 20-30 lbs fluid on HD 3 Anemia- hgb 8.3 on aranesp  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, binders  5 Vasc access- has L forearm AVF  6 HTN- BP too high, will resume po hydralazine, cont norvasc/coreg   Plan- Daily HD for volume overload for now, CLIP in process;  AVF working well again today.    Lenny Pastel, PA-C [facility] Beeper [phone] 03/13/2014,12:51 PM  LOS: 5 days  Pt seen, examined, agree w assess/plan as above with additions as indicated.  Discussed importance of limiting fluid and salt intake with patient again. HD today and then again Monday.   Vinson Moselle MD pager [phone]    cell [phone] 03/13/2014, 1:24 PM      Weight change: 2.3 kg (5 lb 1.1 oz)  Labs: Basic Metabolic Panel:  Recent Labs Lab 03/11/14 0500 03/12/14 1045 03/13/14 0655  NA 138 140 140  K 3.3* 3.1* 3.7  CL 97 102 100  CO2 23 21 23   GLUCOSE 90 92 85  BUN 52* 56* 44*  CREATININE 4.98* 5.66* 4.78*  CALCIUM 7.3* 7.9* 8.2*  PHOS 4.2  --   --    Liver Function Tests:  Recent Labs Lab 03/08/14 1224 03/11/14 0500 03/12/14 1045 03/13/14 0655  AST 77*  --  32 33  ALT 247*  --  114* 106*  ALKPHOS 220*  --  129* 134*  BILITOT 0.5  --  0.3 0.3  PROT 7.4  --  7.1 7.7  ALBUMIN 2.6* 2.3* 2.6* 2.9*   CBC:  Recent  Labs Lab 03/08/14 1224 03/09/14 0408 03/11/14 0554 03/12/14 1045 03/13/14 0655  WBC 7.2 6.2 5.0 7.2 8.3  NEUTROABS 5.7  --   --   --   --   HGB 9.6* 9.3* 8.5* 8.1* 8.3*  HCT 28.7* 28.2* 25.9* 25.3* 26.8*  MCV 87.0 87.3 89.3 92.7 95.4  PLT 198 158 37* 55* 39*   Cardiac Enzymes:  Recent Labs Lab 03/08/14 1424 03/08/14 1630 03/08/14 2256 03/09/14 0408  TROPONINI <0.30 <0.30 <0.30 <0.30   Studies/Results: No results found. Medications:   . amLODipine  10 mg Oral BID  . carvedilol  25 mg Oral BID WC  . darbepoetin (ARANESP) injection - DIALYSIS  100 mcg Intravenous Q Wed-HD  . doxercalciferol  2 mcg Intravenous Q T,Th,Sa-HD  . hydrALAZINE  100 mg Oral 3 times per day  . sevelamer carbonate  1,600 mg Oral TID WC  . sodium chloride  3 mL Intravenous Q12H

## 2014-03-14 LAB — COMPREHENSIVE METABOLIC PANEL
ALT: 84 U/L — ABNORMAL HIGH (ref 0–53)
AST: 29 U/L (ref 0–37)
Albumin: 2.9 g/dL — ABNORMAL LOW (ref 3.5–5.2)
Alkaline Phosphatase: 121 U/L — ABNORMAL HIGH (ref 39–117)
BILIRUBIN TOTAL: 0.4 mg/dL (ref 0.3–1.2)
BUN: 35 mg/dL — ABNORMAL HIGH (ref 6–23)
CALCIUM: 8.3 mg/dL — AB (ref 8.4–10.5)
CHLORIDE: 99 meq/L (ref 96–112)
CO2: 26 meq/L (ref 19–32)
Creatinine, Ser: 4.37 mg/dL — ABNORMAL HIGH (ref 0.50–1.35)
GFR calc Af Amer: 18 mL/min — ABNORMAL LOW (ref >90)
GFR, EST NON AFRICAN AMERICAN: 16 mL/min — AB (ref >90)
Glucose, Bld: 97 mg/dL (ref 70–99)
Potassium: 4.1 mEq/L (ref 3.7–5.3)
SODIUM: 139 meq/L (ref 137–147)
Total Protein: 7.8 g/dL (ref 6.0–8.3)

## 2014-03-14 LAB — CBC
HCT: 28.2 % — ABNORMAL LOW (ref 39.0–52.0)
HEMOGLOBIN: 8.7 g/dL — AB (ref 13.0–17.0)
MCH: 29.9 pg (ref 26.0–34.0)
MCHC: 30.9 g/dL (ref 30.0–36.0)
MCV: 96.9 fL (ref 78.0–100.0)
Platelets: 39 10*3/uL — ABNORMAL LOW (ref 150–400)
RBC: 2.91 MIL/uL — ABNORMAL LOW (ref 4.22–5.81)
RDW: 21.1 % — ABNORMAL HIGH (ref 11.5–15.5)
WBC: 7.3 10*3/uL (ref 4.0–10.5)

## 2014-03-14 MED ORDER — HYDRALAZINE HCL 50 MG PO TABS
100.0000 mg | ORAL_TABLET | Freq: Three times a day (TID) | ORAL | Status: DC
  Administered 2014-03-14 – 2014-03-16 (×5): 100 mg via ORAL
  Filled 2014-03-14 (×10): qty 2

## 2014-03-14 MED ORDER — CLONIDINE HCL 0.2 MG PO TABS
0.2000 mg | ORAL_TABLET | Freq: Three times a day (TID) | ORAL | Status: DC
  Administered 2014-03-14 (×3): 0.2 mg via ORAL
  Filled 2014-03-14 (×6): qty 1

## 2014-03-14 MED ORDER — RENA-VITE PO TABS
1.0000 | ORAL_TABLET | Freq: Every day | ORAL | Status: DC
  Administered 2014-03-14 – 2014-03-15 (×2): 1 via ORAL
  Filled 2014-03-14 (×3): qty 1

## 2014-03-14 MED ORDER — HYDRALAZINE HCL 50 MG PO TABS: 100.0000 mg | ORAL_TABLET | Freq: Four times a day (QID) | ORAL | Status: DC

## 2014-03-14 NOTE — Progress Notes (Signed)
Progress Note  Matthew Beard RUE:[id number] DOB: Sep 27, 1976 DOA: 03/08/2014 PCP: Matthew BuddyWEBB,[name], MD  Time spent :  35mins   Brief narrative:  38 y.o. M PMHx CKD stage V and hypertension, who presented to the ED with the primary complaint of shortness of breath. Patient had fistula placed on his LUE in December 2014, anticipating hemodialysis. Patient was recently in the hospital, discharged about 3 weeks ago since then he started to have shortness of breath. He was sent to the Emergency room by his physician on 02/26/2014 but left before he was seen by the staff. He has had an difficult time getting around the house and lately becoming more dyspneic than normal. He complains of SOB, leg edema up to his waist, he does have orthopnea, PND but denies any substernal chest pain or palpitations.   In the ED CXR wasn't convincing for pulmonary edema, patient creatinine was 8.3 and BUN was 110. His proBNP was >70,000. Patient also had transaminitis and low albumin. Nephrology consulted for further evaluation.    HPI/Subjective:  Issues sitting in chair, no headache fever chills, no chest pain palpitations. No shortness of breath. No abdominal pain. No focal weakness. Swelling is going down.    Assessment/Plan:   Acute respiratory failure with hypoxia due to:  Acute diastolic heart failure caused by fluid overload in a patient with ESRD.  Resolved after dialysis, still has anasarca and significant edema in lower extremities, continue dialysis per renal, will discharge once outpatient dialysis treatments arranged. Shortness of breath has resolved.     ESRD. She did on dialysis this admission, renal following. Once outpatient dialysis arranged will be discharged.     Anasarca/Scrotal edema  -treatment as above    Accelerated Hypertension   -Blood pressure is improved on Norvasc, hydralazine and Coreg, added Catapres for better control.    Anemia  -Secondary to ESRD,  management per nephrology.     Transaminitis   Likely due to hepatic congestion from fluid overload, acute hepatitis panel negative, right upper quadrant ultrasound equivocal. Does not have right upper quadrant pain or tenderness. Liver enzymes trending down.    Thrombocytopenia  Noted on 03/11/2014, no bleeding, will remove heparin for DVT prophylaxis, too soon for HIT. We'll continue to monitor the trend.todays CBC pending.  Lab Results  Component Value Date   PLT 39* 03/14/2014        DVT prophylaxis: SCDs - no heparin due to thrombocytopenia   Code Status: Full Family Communication: No family at bedside  Disposition Plan/Expected LOS: Home once outpatient dialysis is arranged.    Consultants: Nephrology  Procedures: None  Antibiotics: None  Objective: Blood pressure 152/95, pulse 88, temperature 98.2 F (36.8 C), temperature source Oral, resp. rate 19, height 5\' 7"  (1.702 m), weight 83.1 kg (183 lb 3.2 oz), SpO2 96.00%.  Intake/Output Summary (Last 24 hours) at 03/14/14 1039 Last data filed at 03/14/14 0900  Gross per 24 hour  Intake    960 ml  Output   3500 ml  Net  -2540 ml   Exam: General: No acute respiratory distress at rest  Lungs: Clear to auscultation bilaterally without wheezes or crackles, RA Cardiovascular: Regular rate and rhythm without murmur gallop or rub normal S1 and S2, significant 3+ pitting peripheral edema  Abdomen: Nontender, nondistended, soft, bowel sounds positive, no rebound, no ascites, no appreciable mass Genitourinary: Significant penile and scrotal edema Musculoskeletal: No significant cyanosis, clubbing of bilateral lower extremities Neurological: Alert and oriented x 3, moves  all extremities x 4 without focal neurological deficits, CN 2-12 intact  Scheduled Meds:  Scheduled Meds: . amLODipine  10 mg Oral BID  . carvedilol  25 mg Oral BID WC  . cloNIDine  0.2 mg Oral TID  . darbepoetin (ARANESP) injection - DIALYSIS   100 mcg Intravenous Q Wed-HD  . doxercalciferol  2 mcg Intravenous Q T,Th,Sa-HD  . hydrALAZINE  100 mg Oral 3 times per day  . sevelamer carbonate  1,600 mg Oral TID WC  . sodium chloride  3 mL Intravenous Q12H   Data Reviewed: Basic Metabolic Panel:  Recent Labs Lab 03/09/14 0408 03/11/14 0500 03/12/14 1045 03/13/14 0655 03/14/14 0530  NA 139 138 140 140 139  K 3.2* 3.3* 3.1* 3.7 4.1  CL 98 97 102 100 99  CO2 21 23 21 23 26   GLUCOSE 97 90 92 85 97  BUN 90* 52* 56* 44* 35*  CREATININE 7.35* 4.98* 5.66* 4.78* 4.37*  CALCIUM 7.2* 7.3* 7.9* 8.2* 8.3*  PHOS  --  4.2  --   --   --    Liver Function Tests:  Recent Labs Lab 03/08/14 1224 03/11/14 0500 03/12/14 1045 03/13/14 0655 03/14/14 0530  AST 77*  --  32 33 29  ALT 247*  --  114* 106* 84*  ALKPHOS 220*  --  129* 134* 121*  BILITOT 0.5  --  0.3 0.3 0.4  PROT 7.4  --  7.1 7.7 7.8  ALBUMIN 2.6* 2.3* 2.6* 2.9* 2.9*   CBC:  Recent Labs Lab 03/08/14 1224 03/09/14 0408 03/11/14 0554 03/12/14 1045 03/13/14 0655 03/14/14 0530  WBC 7.2 6.2 5.0 7.2 8.3 7.3  NEUTROABS 5.7  --   --   --   --   --   HGB 9.6* 9.3* 8.5* 8.1* 8.3* 8.7*  HCT 28.7* 28.2* 25.9* 25.3* 26.8* 28.2*  MCV 87.0 87.3 89.3 92.7 95.4 96.9  PLT 198 158 37* 55* 39* 39*   Cardiac Enzymes:  Recent Labs Lab 03/08/14 1424 03/08/14 1630 03/08/14 2256 03/09/14 0408  TROPONINI <0.30 <0.30 <0.30 <0.30   BNP (last 3 results)  Recent Labs  07/04/13 0640 02/11/14 0800 03/08/14 1224  PROBNP 6732.0* >70000.0* >70000.0*   CBG: No results found for this basename: GLUCAP,  in the last 168 hours  Recent Results (from the past 240 hour(s))  MRSA PCR SCREENING     Status: None   Collection Time    03/08/14  4:35 PM      Result Value Ref Range Status   MRSA by PCR NEGATIVE  NEGATIVE Final   Comment:            The GeneXpert MRSA Assay (FDA     approved for NASAL specimens     only), is one component of a     comprehensive MRSA colonization      surveillance program. It is not     intended to diagnose MRSA     infection nor to guide or     monitor treatment for     MRSA infections.  URINE CULTURE     Status: None   Collection Time    03/08/14  9:08 PM      Result Value Ref Range Status   Specimen Description URINE, RANDOM   Final   Special Requests NONE   Final   Culture  Setup Time     Final   Value: 03/09/2014 04:20     Performed at [facility]  Count     Final   Value: NO GROWTH     Performed at [facility]   Culture     Final   Value: NO GROWTH     Performed at [facility]   Report Status 03/10/2014 FINAL   Final     Studies:  Recent x-ray studies have been reviewed in detail by the Attending Physician     LOS: 6 days    Leroy Sea M.D on 03/14/2014 at 10:39 AM  Between 7am to 7pm - Pager - [phone], After 7pm go to [email or web address] - password [email or web address]  And look for the night coverage person covering me after hours  [facility]  Office  [phone]

## 2014-03-14 NOTE — Progress Notes (Signed)
Subjective:  No cos ."they got more fluid off yesterday and feeling better" Tolerated hd with  uf yesterday  Objective Vital signs in last 24 hours: Filed Vitals:   03/13/14 2003 03/13/14 2051 03/14/14 0500 03/14/14 0501  BP: 156/86   157/103  Pulse: 72   85  Temp: 98.7 F (37.1 C)   98.1 F (36.7 C)  TempSrc:      Resp: 18   18  Height:      Weight:  83.1 kg (183 lb 3.2 oz) 83.1 kg (183 lb 3.2 oz)   SpO2: 95%   98%   Weight change: -0.7 kg (-1 lb 8.7 oz)  Physical Exam: General=alert/nad  Lungs= CTA bilat. No rales or rhonchi  Abd= BS +, soft, NT,ND,  Extrem.= 2 to  3 + bipedal edema  HD access= L FA AVF pos. 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 Watch bp trend with uf volume and taper down med's as able  Matthew Pastel, PA-C [facility] Beeper [phone] 03/14/2014,8:33 AM  LOS: 6 days   Pt seen, examined and agree w A/P as above.  HD yest with 4kg off, continues to gain weight between HD sessions.  15kg of fluid removed over last 6 days and wt is only down 6-7kg.  Explained again to pt the importance of fluid restriction. Dietary consult requested. Awaiting CLIP. HD tomorrow.   Vinson Moselle MD pager [phone]    cell [phone] 03/14/2014, 1:44 PM    Labs: Basic Metabolic Panel:  Recent Labs Lab 03/11/14 0500 03/12/14 1045 03/13/14 0655 03/14/14 0530  NA 138 140 140 139  K 3.3* 3.1* 3.7  4.1  CL 97 102 100 99  CO2 23 21 23 26   GLUCOSE 90 92 85 97  BUN 52* 56* 44* 35*  CREATININE 4.98* 5.66* 4.78* 4.37*  CALCIUM 7.3* 7.9* 8.2* 8.3*  PHOS 4.2  --   --   --    Liver Function Tests:  Recent Labs Lab 03/12/14 1045 03/13/14 0655 03/14/14 0530  AST 32 33 29  ALT 114* 106* 84*  ALKPHOS 129* 134* 121*  BILITOT 0.3 0.3 0.4  PROT 7.1 7.7 7.8  ALBUMIN 2.6* 2.9* 2.9*  CBC:  Recent Labs Lab 03/08/14 1224 03/09/14 0408 03/11/14 0554 03/12/14 1045 03/13/14 0655 03/14/14 0530  WBC 7.2 6.2 5.0 7.2 8.3 7.3  NEUTROABS 5.7  --   --   --   --   --   HGB 9.6* 9.3* 8.5* 8.1* 8.3* 8.7*  HCT 28.7* 28.2* 25.9* 25.3* 26.8* 28.2*  MCV 87.0 87.3 89.3 92.7 95.4 96.9  PLT 198 158 37* 55* 39* 39*   Cardiac Enzymes:  Recent Labs Lab 03/08/14 1424 03/08/14 1630 03/08/14 2256 03/09/14 0408  TROPONINI <0.30 <0.30 <0.30 <0.30   Medications:   .  amLODipine  10 mg Oral BID  . carvedilol  25 mg Oral BID WC  . cloNIDine  0.2 mg Oral TID  . darbepoetin (ARANESP) injection - DIALYSIS  100 mcg Intravenous Q Wed-HD  . doxercalciferol  2 mcg Intravenous Q T,Th,Sa-HD  . hydrALAZINE  100 mg Oral 3 times per day  . sevelamer carbonate  1,600 mg Oral TID WC  . sodium chloride  3 mL Intravenous Q12H

## 2014-03-15 LAB — CBC
HCT: 25.3 % — ABNORMAL LOW (ref 39.0–52.0)
Hemoglobin: 8 g/dL — ABNORMAL LOW (ref 13.0–17.0)
MCH: 29.5 pg (ref 26.0–34.0)
MCHC: 31.6 g/dL (ref 30.0–36.0)
MCV: 93.4 fL (ref 78.0–100.0)
PLATELETS: 77 10*3/uL — AB (ref 150–400)
RBC: 2.71 MIL/uL — ABNORMAL LOW (ref 4.22–5.81)
RDW: 20.5 % — ABNORMAL HIGH (ref 11.5–15.5)
WBC: 6 10*3/uL (ref 4.0–10.5)

## 2014-03-15 LAB — COMPREHENSIVE METABOLIC PANEL
ALBUMIN: 2.7 g/dL — AB (ref 3.5–5.2)
ALT: 56 U/L — ABNORMAL HIGH (ref 0–53)
AST: 19 U/L (ref 0–37)
Alkaline Phosphatase: 105 U/L (ref 39–117)
BUN: 57 mg/dL — ABNORMAL HIGH (ref 6–23)
CALCIUM: 8.2 mg/dL — AB (ref 8.4–10.5)
CHLORIDE: 97 meq/L (ref 96–112)
CO2: 22 mEq/L (ref 19–32)
CREATININE: 5.73 mg/dL — AB (ref 0.50–1.35)
GFR calc Af Amer: 13 mL/min — ABNORMAL LOW (ref >90)
GFR calc non Af Amer: 11 mL/min — ABNORMAL LOW (ref >90)
Glucose, Bld: 131 mg/dL — ABNORMAL HIGH (ref 70–99)
Potassium: 4.4 mEq/L (ref 3.7–5.3)
Sodium: 136 mEq/L — ABNORMAL LOW (ref 137–147)
Total Bilirubin: 0.3 mg/dL (ref 0.3–1.2)
Total Protein: 7.1 g/dL (ref 6.0–8.3)

## 2014-03-15 MED ORDER — DOXERCALCIFEROL 4 MCG/2ML IV SOLN
INTRAVENOUS | Status: AC
  Filled 2014-03-15: qty 2

## 2014-03-15 MED ORDER — CLONIDINE HCL 0.1 MG PO TABS
0.1000 mg | ORAL_TABLET | Freq: Three times a day (TID) | ORAL | Status: DC
  Administered 2014-03-15 – 2014-03-16 (×2): 0.1 mg via ORAL
  Filled 2014-03-15 (×4): qty 1

## 2014-03-15 MED ORDER — DIPHENHYDRAMINE HCL 25 MG PO CAPS
ORAL_CAPSULE | ORAL | Status: AC
  Filled 2014-03-15: qty 2

## 2014-03-15 NOTE — Progress Notes (Signed)
03/15/2014 3:21 PM Hemodialysis Outpatient Note; this patient has been accepted at the [facility] on a Monday, Wednesday and Friday 2nd shift schedule. The patient can begin treatment on Wednesday Apr 16, 2014 at 10:30 AM. Thank you. Tilman Neat

## 2014-03-15 NOTE — Progress Notes (Signed)
[facility] ROUNDING NOTE   Subjective:   Interval History: comfortable eating breakfast.   Objective:  Vital signs in last 24 hours:  Temp:  [98.3 F (36.8 C)-98.8 F (37.1 C)] 98.3 F (36.8 C) (04/27 0544) Pulse Rate:  [70-82] 72 (04/27 0544) Resp:  [18] 18 (04/27 0544) BP: (120-141)/(75-95) 120/77 mmHg (04/27 0544) SpO2:  [97 %-99 %] 99 % (04/27 0544) Weight:  [82.4 kg (181 lb 10.5 oz)-84.6 kg (186 lb 8.2 oz)] 84.6 kg (186 lb 8.2 oz) (04/27 0544)  Weight change: -0.9 kg (-1 lb 15.8 oz) Filed Weights   03/14/14 0500 03/14/14 1349 03/15/14 0544  Weight: 83.1 kg (183 lb 3.2 oz) 82.4 kg (181 lb 10.5 oz) 84.6 kg (186 lb 8.2 oz)    Intake/Output: I/O last 3 completed shifts: In: 1320 [P.O.:1320] Out: -    Intake/Output this shift:    General alert/nad  Lungs CTA  Abd  No distress BS active Extrem.= 2 + edema HD access  L FA AVF pos. Bruit    Basic Metabolic Panel:  Recent Labs Lab 03/09/14 0408 03/11/14 0500 03/12/14 1045 03/13/14 0655 03/14/14 0530  NA 139 138 140 140 139  K 3.2* 3.3* 3.1* 3.7 4.1  CL 98 97 102 100 99  CO2 21 23 21 23 26   GLUCOSE 97 90 92 85 97  BUN 90* 52* 56* 44* 35*  CREATININE 7.35* 4.98* 5.66* 4.78* 4.37*  CALCIUM 7.2* 7.3* 7.9* 8.2* 8.3*  PHOS  --  4.2  --   --   --     Liver Function Tests:  Recent Labs Lab 03/08/14 1224 03/11/14 0500 03/12/14 1045 03/13/14 0655 03/14/14 0530  AST 77*  --  32 33 29  ALT 247*  --  114* 106* 84*  ALKPHOS 220*  --  129* 134* 121*  BILITOT 0.5  --  0.3 0.3 0.4  PROT 7.4  --  7.1 7.7 7.8  ALBUMIN 2.6* 2.3* 2.6* 2.9* 2.9*   No results found for this basename: LIPASE, AMYLASE,  in the last 168 hours No results found for this basename: AMMONIA,  in the last 168 hours  CBC:  Recent Labs Lab 03/08/14 1224 03/09/14 0408 03/11/14 0554 03/12/14 1045 03/13/14 0655 03/14/14 0530  WBC 7.2 6.2 5.0 7.2 8.3 7.3  NEUTROABS 5.7  --   --   --   --   --   HGB 9.6* 9.3* 8.5* 8.1*  8.3* 8.7*  HCT 28.7* 28.2* 25.9* 25.3* 26.8* 28.2*  MCV 87.0 87.3 89.3 92.7 95.4 96.9  PLT 198 158 37* 55* 39* 39*    Cardiac Enzymes:  Recent Labs Lab 03/08/14 1424 03/08/14 1630 03/08/14 2256 03/09/14 0408  TROPONINI <0.30 <0.30 <0.30 <0.30    BNP: No components found with this basename: POCBNP,   CBG: No results found for this basename: GLUCAP,  in the last 168 hours  Microbiology: Results for orders placed during the hospital encounter of 03/08/14  MRSA PCR SCREENING     Status: None   Collection Time    03/08/14  4:35 PM      Result Value Ref Range Status   MRSA by PCR NEGATIVE  NEGATIVE Final   Comment:            The GeneXpert MRSA Assay (FDA     approved for NASAL specimens     only), is one component of a     comprehensive MRSA colonization     surveillance program. It is not  intended to diagnose MRSA     infection nor to guide or     monitor treatment for     MRSA infections.  URINE CULTURE     Status: None   Collection Time    03/08/14  9:08 PM      Result Value Ref Range Status   Specimen Description URINE, RANDOM   Final   Special Requests NONE   Final   Culture  Setup Time     Final   Value: 03/09/2014 04:20     Performed at [facility] Count     Final   Value: NO GROWTH     Performed at [facility]   Culture     Final   Value: NO GROWTH     Performed at [facility]   Report Status 03/10/2014 FINAL   Final    Coagulation Studies: No results found for this basename: LABPROT, INR,  in the last 72 hours  Urinalysis: No results found for this basename: COLORURINE, APPERANCEUR, LABSPEC, PHURINE, GLUCOSEU, HGBUR, BILIRUBINUR, KETONESUR, PROTEINUR, UROBILINOGEN, NITRITE, LEUKOCYTESUR,  in the last 72 hours    Imaging: No results found.   Medications:     . amLODipine  10 mg Oral BID  . carvedilol  25 mg Oral BID WC  . cloNIDine  0.2 mg Oral TID  . darbepoetin (ARANESP) injection - DIALYSIS  100  mcg Intravenous Q Wed-HD  . doxercalciferol  2 mcg Intravenous Q T,Th,Sa-HD  . hydrALAZINE  100 mg Oral 3 times per day  . multivitamin  1 tablet Oral QHS  . sevelamer carbonate  1,600 mg Oral TID WC  . 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     LOS: 7 [name] @TODAY @10 :58 AM

## 2014-03-15 NOTE — Progress Notes (Signed)
Progress Note  Carmell AustriaDamon N Harney MWU:[id number] DOB: Apr 18, 1976 DOA: 03/08/2014 PCP: [name],[name], MD  Time spent :  35mins   Brief narrative:  38 y.o. M PMHx CKD stage V and hypertension, who presented to the ED with the primary complaint of shortness of breath. Patient had fistula placed on his LUE in December 2014, anticipating hemodialysis. Patient was recently in the hospital, discharged about 3 weeks ago since then he started to have shortness of breath. He was sent to the Emergency room by his physician on 02/26/2014 but left before he was seen by the staff. He has had an difficult time getting around the house and lately becoming more dyspneic than normal. He complains of SOB, leg edema up to his waist, he does have orthopnea, PND but denies any substernal chest pain or palpitations.   In the ED CXR wasn't convincing for pulmonary edema, patient creatinine was 8.3 and BUN was 110. His proBNP was >70,000. Patient also had transaminitis and low albumin. Nephrology consulted for further evaluation.    HPI/Subjective:  Patient lying comfortably in bed, no headache fever chills, no chest pain palpitations. No shortness of breath. No abdominal pain. No focal weakness. Swelling is going down.    Assessment/Plan:   Acute respiratory failure with hypoxia due to:  Acute diastolic heart failure caused by fluid overload in a patient with ESRD.  Resolved after dialysis, still has anasarca and significant edema in lower extremities, continue dialysis per renal, will discharge once outpatient dialysis treatments arranged. Shortness of breath has resolved.     ESRD. She did on dialysis this admission, renal following. Once outpatient dialysis arranged will be discharged.     Anasarca/Scrotal edema  -treatment as above    Accelerated Hypertension   -Blood pressure is improved on Norvasc, hydralazine and Coreg, added Catapres for better control.    Anemia  -Secondary to ESRD,  management per nephrology.     Transaminitis   Likely due to hepatic congestion from fluid overload, acute hepatitis panel negative, right upper quadrant ultrasound equivocal. Does not have right upper quadrant pain or tenderness. Liver enzymes trending down.    Thrombocytopenia  Noted on 03/11/2014, no bleeding, will remove heparin for DVT prophylaxis, too soon for HIT. We'll continue to monitor the trend.todays CBC pending. We have stopped all forms of heparin including during dialysis. Discussed with Inetta Fermoina.  Lab Results  Component Value Date   PLT 39* 03/14/2014        DVT prophylaxis: SCDs - no heparin due to thrombocytopenia   Code Status: Full Family Communication: No family at bedside  Disposition Plan/Expected LOS: Home once outpatient dialysis is arranged.    Consultants: Nephrology  Procedures: None  Antibiotics: None  Objective: Blood pressure 120/77, pulse 72, temperature 98.3 F (36.8 C), temperature source Oral, resp. rate 18, height 5\' 7"  (1.702 m), weight 84.6 kg (186 lb 8.2 oz), SpO2 99.00%.  Intake/Output Summary (Last 24 hours) at 03/15/14 1054 Last data filed at 03/15/14 [id number]  Gross per 24 hour  Intake    600 ml  Output      0 ml  Net    600 ml   Exam: General: No acute respiratory distress at rest  Lungs: Clear to auscultation bilaterally without wheezes or crackles, RA Cardiovascular: Regular rate and rhythm without murmur gallop or rub normal S1 and S2, significant 3+ pitting peripheral edema  Abdomen: Nontender, nondistended, soft, bowel sounds positive, no rebound, no ascites, no appreciable mass Genitourinary: Significant penile  and scrotal edema Musculoskeletal: No significant cyanosis, clubbing of bilateral lower extremities Neurological: Alert and oriented x 3, moves all extremities x 4 without focal neurological deficits, CN 2-12 intact  Scheduled Meds:  Scheduled Meds: . amLODipine  10 mg Oral BID  . carvedilol  25 mg Oral  BID WC  . cloNIDine  0.2 mg Oral TID  . darbepoetin (ARANESP) injection - DIALYSIS  100 mcg Intravenous Q Wed-HD  . doxercalciferol  2 mcg Intravenous Q T,Th,Sa-HD  . hydrALAZINE  100 mg Oral 3 times per day  . multivitamin  1 tablet Oral QHS  . sevelamer carbonate  1,600 mg Oral TID WC  . sodium chloride  3 mL Intravenous Q12H   Data Reviewed: Basic Metabolic Panel:  Recent Labs Lab 03/09/14 0408 03/11/14 0500 03/12/14 1045 03/13/14 0655 03/14/14 0530  NA 139 138 140 140 139  K 3.2* 3.3* 3.1* 3.7 4.1  CL 98 97 102 100 99  CO2 21 23 21 23 26   GLUCOSE 97 90 92 85 97  BUN 90* 52* 56* 44* 35*  CREATININE 7.35* 4.98* 5.66* 4.78* 4.37*  CALCIUM 7.2* 7.3* 7.9* 8.2* 8.3*  PHOS  --  4.2  --   --   --    Liver Function Tests:  Recent Labs Lab 03/08/14 1224 03/11/14 0500 03/12/14 1045 03/13/14 0655 03/14/14 0530  AST 77*  --  32 33 29  ALT 247*  --  114* 106* 84*  ALKPHOS 220*  --  129* 134* 121*  BILITOT 0.5  --  0.3 0.3 0.4  PROT 7.4  --  7.1 7.7 7.8  ALBUMIN 2.6* 2.3* 2.6* 2.9* 2.9*   CBC:  Recent Labs Lab 03/08/14 1224 03/09/14 0408 03/11/14 0554 03/12/14 1045 03/13/14 0655 03/14/14 0530  WBC 7.2 6.2 5.0 7.2 8.3 7.3  NEUTROABS 5.7  --   --   --   --   --   HGB 9.6* 9.3* 8.5* 8.1* 8.3* 8.7*  HCT 28.7* 28.2* 25.9* 25.3* 26.8* 28.2*  MCV 87.0 87.3 89.3 92.7 95.4 96.9  PLT 198 158 37* 55* 39* 39*   Cardiac Enzymes:  Recent Labs Lab 03/08/14 1424 03/08/14 1630 03/08/14 2256 03/09/14 0408  TROPONINI <0.30 <0.30 <0.30 <0.30   BNP (last 3 results)  Recent Labs  07/04/13 0640 02/11/14 0800 03/08/14 1224  PROBNP 6732.0* >70000.0* >70000.0*   CBG: No results found for this basename: GLUCAP,  in the last 168 hours  Recent Results (from the past 240 hour(s))  MRSA PCR SCREENING     Status: None   Collection Time    03/08/14  4:35 PM      Result Value Ref Range Status   MRSA by PCR NEGATIVE  NEGATIVE Final   Comment:            The GeneXpert MRSA  Assay (FDA     approved for NASAL specimens     only), is one component of a     comprehensive MRSA colonization     surveillance program. It is not     intended to diagnose MRSA     infection nor to guide or     monitor treatment for     MRSA infections.  URINE CULTURE     Status: None   Collection Time    03/08/14  9:08 PM      Result Value Ref Range Status   Specimen Description URINE, RANDOM   Final   Special Requests NONE   Final  Culture  Setup Time     Final   Value: 03/09/2014 04:20     Performed at [facility] Count     Final   Value: NO GROWTH     Performed at [facility]   Culture     Final   Value: NO GROWTH     Performed at [facility]   Report Status 03/10/2014 FINAL   Final     Studies:  Recent x-ray studies have been reviewed in detail by the Attending Physician     LOS: 7 days    Leroy Sea M.D on 03/15/2014 at 10:54 AM  Between 7am to 7pm - Pager - [phone], After 7pm go to [email or web address] - password [email or web address]  And look for the night coverage person covering me after hours  [facility]  Office  [phone]

## 2014-03-15 NOTE — Progress Notes (Signed)
Nutrition Education Note  RD consulted for Renal Education - specifically on Low Sodium and Fluid Restriction. RD attempted to see patient this AM, however he declined RD visit while he was eating a [employer] biscuit. Provided handouts to patient and encouraged him to review them on his own time. Pt currently in HD. Will re-attempt efforts.  Unable to assess compliance.  Body mass index is 29.2 kg/(m^2). Pt meets criteria for overweight based on current BMI.  Current diet order is Renal, patient is consuming approximately 50-100% of meals at this time. Labs and medications reviewed. No further nutrition interventions warranted at this time. RD contact information provided. If additional nutrition issues arise, please re-consult RD.  Jarold Motto MS, RD, LDN Inpatient Registered Dietitian Pager: [phone] After-hours pager: [phone]

## 2014-03-16 LAB — CBC
HCT: 25.8 % — ABNORMAL LOW (ref 39.0–52.0)
Hemoglobin: 8.1 g/dL — ABNORMAL LOW (ref 13.0–17.0)
MCH: 29.7 pg (ref 26.0–34.0)
MCHC: 31.4 g/dL (ref 30.0–36.0)
MCV: 94.5 fL (ref 78.0–100.0)
Platelets: 45 10*3/uL — ABNORMAL LOW (ref 150–400)
RBC: 2.73 MIL/uL — ABNORMAL LOW (ref 4.22–5.81)
RDW: 20.3 % — AB (ref 11.5–15.5)
WBC: 6.2 10*3/uL (ref 4.0–10.5)

## 2014-03-16 LAB — COMPREHENSIVE METABOLIC PANEL
ALK PHOS: 99 U/L (ref 39–117)
ALT: 49 U/L (ref 0–53)
AST: 21 U/L (ref 0–37)
Albumin: 2.7 g/dL — ABNORMAL LOW (ref 3.5–5.2)
BILIRUBIN TOTAL: 0.3 mg/dL (ref 0.3–1.2)
BUN: 42 mg/dL — ABNORMAL HIGH (ref 6–23)
CHLORIDE: 97 meq/L (ref 96–112)
CO2: 26 mEq/L (ref 19–32)
Calcium: 8 mg/dL — ABNORMAL LOW (ref 8.4–10.5)
Creatinine, Ser: 4.7 mg/dL — ABNORMAL HIGH (ref 0.50–1.35)
GFR calc non Af Amer: 14 mL/min — ABNORMAL LOW (ref >90)
GFR, EST AFRICAN AMERICAN: 17 mL/min — AB (ref >90)
GLUCOSE: 87 mg/dL (ref 70–99)
POTASSIUM: 3.9 meq/L (ref 3.7–5.3)
Sodium: 138 mEq/L (ref 137–147)
Total Protein: 7.2 g/dL (ref 6.0–8.3)

## 2014-03-16 MED ORDER — HYDRALAZINE HCL 100 MG PO TABS: 100.0000 mg | ORAL_TABLET | Freq: Three times a day (TID) | ORAL | Status: AC

## 2014-03-16 MED ORDER — SEVELAMER CARBONATE 800 MG PO TABS: 1600.0000 mg | ORAL_TABLET | Freq: Three times a day (TID) | ORAL | Status: AC

## 2014-03-16 MED ORDER — AMLODIPINE BESYLATE 10 MG PO TABS: 10.0000 mg | ORAL_TABLET | Freq: Every day | ORAL | Status: AC

## 2014-03-16 MED ORDER — CARVEDILOL 25 MG PO TABS: 25.0000 mg | ORAL_TABLET | Freq: Two times a day (BID) | ORAL | Status: AC

## 2014-03-16 MED ORDER — CLONIDINE HCL 0.2 MG PO TABS: 0.2000 mg | ORAL_TABLET | Freq: Three times a day (TID) | ORAL | Status: DC

## 2014-03-16 MED ORDER — ISOSORBIDE MONONITRATE ER 30 MG PO TB24: 30.0000 mg | ORAL_TABLET | Freq: Every day | ORAL | Status: AC

## 2014-03-16 NOTE — Progress Notes (Signed)
Matthew Beard was admitted to the Hospital on 03/08/2014 and Discharged  03/16/2014 and should be excused from work/school/court   for 10  days starting 03/08/2014 , may return to work/school without any restrictions.  Call Susa Raring MD, [facility] [phone] with questions.  Leroy Sea M.D on 03/16/2014,at 10:41 AM  [facility] Office  [phone]

## 2014-03-16 NOTE — Plan of Care (Signed)
Problem: Food- and Nutrition-Related Knowledge Deficit (NB-1.1) Goal: Nutrition education Formal process to instruct or train a patient/client in a skill or to impart knowledge to help patients/clients voluntarily manage or modify food choices and eating behavior to maintain or improve health. Outcome: Completed/Met Date Met:  03/16/14 Nutrition Education Note  RD consulted for Renal Education. Provided Kidney Disease Pyramid to patient/family. Reviewed food groups and provided written recommended serving sizes specifically determined for patient's current nutritional status.   Explained why diet restrictions are needed and provided lists of foods to limit/avoid that are high potassium, sodium, and phosphorus. Provided specific recommendations on safer alternatives of these foods. Strongly encouraged compliance of this diet.   Discussed importance of protein intake at each meal and snack. Provided examples of how to maximize protein intake throughout the day. Discussed need for fluid restriction with dialysis, importance of minimizing weight gain between HD treatments, and renal-friendly beverage options.  Encouraged pt to discuss specific diet questions/concerns with RD at HD outpatient facility. Teach back method used.  Expect poor compliance.  Body mass index is 27.97 kg/(m^2). Pt meets criteria for overweight based on current BMI.  Current diet order is Reanl, patient is consuming approximately 100% of meals at this time. Labs and medications reviewed. No further nutrition interventions warranted at this time. RD contact information provided. If additional nutrition issues arise, please re-consult RD.  Inda Coke MS, RD, LDN Inpatient Registered Dietitian Pager: [phone] After-hours pager: [phone]

## 2014-03-16 NOTE — Progress Notes (Signed)
[facility] ROUNDING NOTE   Subjective:   Interval History: feels great no complaints   Objective:  Vital signs in last 24 hours:  Temp:  [97 F (36.1 C)-98.6 F (37 C)] 98.6 F (37 C) (04/28 0829) Pulse Rate:  [70-88] 87 (04/28 0829) Resp:  [18-21] 19 (04/28 0829) BP: (127-161)/(85-106) 145/90 mmHg (04/28 0829) SpO2:  [94 %-100 %] 100 % (04/28 0829) Weight:  [77.2 kg (170 lb 3.1 oz)-82.6 kg (182 lb 1.6 oz)] 81.012 kg (178 lb 9.6 oz) (04/27 2032)  Weight change: 0.2 kg (7.1 oz) Filed Weights   03/15/14 1330 03/15/14 1742 03/15/14 2032  Weight: 82.6 kg (182 lb 1.6 oz) 77.2 kg (170 lb 3.1 oz) 81.012 kg (178 lb 9.6 oz)    Intake/Output: I/O last 3 completed shifts: In: 720 [P.O.:720] Out: 4500 [Other:4500]   Intake/Output this shift:     CVS- RRR RS- CTA ABD- BS present soft non-distended EXT- no edema   Basic Metabolic Panel:  Recent Labs Lab 03/11/14 0500 03/12/14 1045 03/13/14 0655 03/14/14 0530 03/15/14 1300 03/16/14 0627  NA 138 140 140 139 136* 138  K 3.3* 3.1* 3.7 4.1 4.4 3.9  CL 97 102 100 99 97 97  CO2 23 21 23 26 22 26   GLUCOSE 90 92 85 97 131* 87  BUN 52* 56* 44* 35* 57* 42*  CREATININE 4.98* 5.66* 4.78* 4.37* 5.73* 4.70*  CALCIUM 7.3* 7.9* 8.2* 8.3* 8.2* 8.0*  PHOS 4.2  --   --   --   --   --     Liver Function Tests:  Recent Labs Lab 03/12/14 1045 03/13/14 0655 03/14/14 0530 03/15/14 1300 03/16/14 0627  AST 32 33 29 19 21   ALT 114* 106* 84* 56* 49  ALKPHOS 129* 134* 121* 105 99  BILITOT 0.3 0.3 0.4 0.3 0.3  PROT 7.1 7.7 7.8 7.1 7.2  ALBUMIN 2.6* 2.9* 2.9* 2.7* 2.7*   No results found for this basename: LIPASE, AMYLASE,  in the last 168 hours No results found for this basename: AMMONIA,  in the last 168 hours  CBC:  Recent Labs Lab 03/12/14 1045 03/13/14 0655 03/14/14 0530 03/15/14 1300 03/16/14 0627  WBC 7.2 8.3 7.3 6.0 6.2  HGB 8.1* 8.3* 8.7* 8.0* 8.1*  HCT 25.3* 26.8* 28.2* 25.3* 25.8*  MCV 92.7 95.4  96.9 93.4 94.5  PLT 55* 39* 39* 77* 45*    Cardiac Enzymes: No results found for this basename: CKTOTAL, CKMB, CKMBINDEX, TROPONINI,  in the last 168 hours  BNP: No components found with this basename: POCBNP,   CBG: No results found for this basename: GLUCAP,  in the last 168 hours  Microbiology: Results for orders placed during the hospital encounter of 03/08/14  MRSA PCR SCREENING     Status: None   Collection Time    03/08/14  4:35 PM      Result Value Ref Range Status   MRSA by PCR NEGATIVE  NEGATIVE Final   Comment:            The GeneXpert MRSA Assay (FDA     approved for NASAL specimens     only), is one component of a     comprehensive MRSA colonization     surveillance program. It is not     intended to diagnose MRSA     infection nor to guide or     monitor treatment for     MRSA infections.  URINE CULTURE     Status: None  Collection Time    03/08/14  9:08 PM      Result Value Ref Range Status   Specimen Description URINE, RANDOM   Final   Special Requests NONE   Final   Culture  Setup Time     Final   Value: 03/09/2014 04:20     Performed at [facility] Count     Final   Value: NO GROWTH     Performed at [facility]   Culture     Final   Value: NO GROWTH     Performed at [facility]   Report Status 03/10/2014 FINAL   Final    Coagulation Studies: No results found for this basename: LABPROT, INR,  in the last 72 hours  Urinalysis: No results found for this basename: COLORURINE, APPERANCEUR, LABSPEC, PHURINE, GLUCOSEU, HGBUR, BILIRUBINUR, KETONESUR, PROTEINUR, UROBILINOGEN, NITRITE, LEUKOCYTESUR,  in the last 72 hours    Imaging: No results found.   Medications:     . amLODipine  10 mg Oral BID  . carvedilol  25 mg Oral BID WC  . cloNIDine  0.1 mg Oral TID  . darbepoetin (ARANESP) injection - DIALYSIS  100 mcg Intravenous Q Wed-HD  . doxercalciferol  2 mcg Intravenous Q T,Th,Sa-HD  . hydrALAZINE  100 mg  Oral 3 times per day  . multivitamin  1 tablet Oral QHS  . sevelamer carbonate  1,600 mg Oral TID WC  . 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

## 2014-03-16 NOTE — Discharge Summary (Signed)
Matthew Beard, is a 38 y.o. male  DOB 02/24/1976  MRN [id number].  Admission date:  03/08/2014  Admitting Physician  Clydia Llano, MD  Discharge Date:  03/16/2014   Primary MD  [name], MD  Recommendations for primary care physician for things to follow:   Follow edema, CBC BMP and blood pressure closely   Admission Diagnosis  Kidney failure [586]   Discharge Diagnosis  Kidney failure [586]  ESRD  Principal Problem:   Dyspnea Active Problems:   Uncontrolled hypertension   Anemia in chronic kidney disease   Acute on chronic kidney disease, stage 4   End stage renal disease   Pulmonary edema   Renal failure   Acute diastolic heart failure      Past Medical History  Diagnosis Date  . Hypertension   . CKD (chronic kidney disease), stage IV   . Normocytic anemia   . Pericardial effusion     Remote hx  . Cerebrovascular disease     Chronic lacunar infarcts  . NICM (nonischemic cardiomyopathy)   . CHF (congestive heart failure)   . Shortness of breath     Past Surgical History  Procedure Laterality Date  . Av fistula placement Left 10/19/2013    Procedure: ARTERIOVENOUS (AV) FISTULA CREATION- LEFT RADIOCEPHALIC;  Surgeon: Larina Earthly, MD;  Location: [facility] OR;  Service: Vascular;  Laterality: Left;     Discharge Condition: stable   Follow UP  Follow-up Information   Follow up with [name], MD. Schedule an appointment as soon as possible for a visit in 1 week.   Specialty:  Nephrology   Contact information:   [address] [place] [address] [phone]         Discharge Instructions  and  Discharge Medications      Discharge Orders   Future Appointments Provider Department Dept Phone   03/30/2014 3:15 PM Lars Masson, MD [facility] [place] Office [phone]   Future Orders  Complete By Expires   Discharge instructions  As directed    Increase activity slowly  As directed        Medication List    STOP taking these medications       furosemide 80 MG tablet  Commonly known as:  LASIX     metolazone 5 MG tablet  Commonly known as:  ZAROXOLYN     potassium chloride 10 MEQ tablet  Commonly known as:  K-DUR,KLOR-CON      TAKE these medications       amLODipine 10 MG tablet  Commonly known as:  NORVASC  Take 1 tablet (10 mg total) by mouth daily.     carvedilol 25 MG tablet  Commonly known as:  COREG  Take 1 tablet (25 mg total) by mouth 2 (two) times daily with a meal.     cloNIDine 0.2 MG tablet  Commonly known as:  CATAPRES  Take 1 tablet (0.2 mg total) by mouth 3 (three) times daily.     hydrALAZINE 100 MG  tablet  Commonly known as:  APRESOLINE  Take 1 tablet (100 mg total) by mouth 3 (three) times daily.     isosorbide mononitrate 30 MG 24 hr tablet  Commonly known as:  IMDUR  Take 1 tablet (30 mg total) by mouth daily.     sevelamer carbonate 800 MG tablet  Commonly known as:  RENVELA  Take 2 tablets (1,600 mg total) by mouth 3 (three) times daily with meals.          Diet and Activity recommendation: See Discharge Instructions above   Consults obtained - Renal   Major procedures and Radiology Reports - PLEASE review detailed and final reports for all details, in brief -       US Abdomen Complete  02/15/2014   CLINICAL DATA:  Right upper quadrant abdominal pain  EXAM: ULTRASOUND ABDOMEN COMPLETE  COMPARISON:  None.  FINDINGS: Gallbladder:  The gallbladder wall is thickened measuring 8 mm. Sludge is appreciated within the gallbladder. There is no evidence of gallstones nor is sonographic Murphy's sign.  Common bile duct:  Diameter: 3 mm  Liver:  No focal lesion identified. Within normal limits in parenchymal echogenicity.  IVC:  No abnormality visualized.  Pancreas:  Visualized portion unremarkable.  Spleen:  Size and  appearance within normal limits.  Right Kidney:  Length: 11.1 cm. Decreased corticomedullary differentiation. No evidence of hydronephrosis, solid or cystic masses, nor calculi.  Left Kidney:  Length: 10.8 cm. Decreased corticomedullary differentiation. No evidence of hydronephrosis, solid or cystic masses, nor calculi.  Abdominal aorta:  Small amount of ascites is appreciated within the abdomen, left pleural effusion partially visualized  Other findings:  Areas ascites appreciated.  Partial visualization of a left pleural effusion  IMPRESSION: Gallbladder wall thickening and sludge within the gallbladder. These findings are equivocal. Differential considerations include gallbladder wall edema due to ascites, cholecystitis cannot be excluded. Clinical correlation recommended.  Left pleural effusion   Electronically Signed   By: Salome Holmes M.D.   On: 02/15/2014 09:34   Dg Chest Port 1 View  03/08/2014   CLINICAL DATA:  Chest pain  EXAM: PORTABLE CHEST - 1 VIEW  COMPARISON:  02/11/2014  FINDINGS: Cardiomegaly is noted. No acute infiltrate or pleural effusion. No pulmonary edema. Stable mild elevation of the right hemidiaphragm.  IMPRESSION: No active disease.  Cardiomegaly again noted.   Electronically Signed   By: Natasha Mead M.D.   On: 03/08/2014 13:12    Micro Results     Recent Results (from the past 240 hour(s))  MRSA PCR SCREENING     Status: None   Collection Time    03/08/14  4:35 PM      Result Value Ref Range Status   MRSA by PCR NEGATIVE  NEGATIVE Final   Comment:            The GeneXpert MRSA Assay (FDA     approved for NASAL specimens     only), is one component of a     comprehensive MRSA colonization     surveillance program. It is not     intended to diagnose MRSA     infection nor to guide or     monitor treatment for     MRSA infections.  URINE CULTURE     Status: None   Collection Time    03/08/14  9:08 PM      Result Value Ref Range Status   Specimen Description  URINE, RANDOM   Final   Special  Requests NONE   Final   Culture  Setup Time     Final   Value: 03/09/2014 04:20     Performed at [facility] Count     Final   Value: NO GROWTH     Performed at [facility]   Culture     Final   Value: NO GROWTH     Performed at [facility]   Report Status 03/10/2014 FINAL   Final     History of present illness and  Hospital Course:     Kindly see H&P for history of present illness and admission details, please review complete Labs, Consult reports and Test reports for all details in brief Matthew Beard, is a 38 y.o. male, patient with history of  CKD stage V and hypertension, who presented to the ED with the primary complaint of shortness of breath. Patient had fistula placed on his LUE in December 2014, anticipating hemodialysis. Patient was recently in the hospital, discharged about 3 weeks ago since then he started to have shortness of breath. He was sent to the Emergency room by his physician on 02/26/2014 but left before he was seen by the staff. He has had an difficult time getting around the house and lately becoming more dyspneic than normal. He complains of SOB, leg edema up to his waist, he does have orthopnea, PND but denies any substernal chest pain or palpitations.       Acute respiratory failure with hypoxia due to Acute diastolic heart failure caused by fluid overload in a patient with ESRD.   Resolved after dialysis, still has anasarca and significant edema in lower extremities, continue dialysis per renal outpatient. Shortness of breath has resolved.     ESRD.  Dialysis 3/week per renal.    Anasarca/Scrotal edema  -treatment as above    Accelerated Hypertension  -Blood pressure is improved on Norvasc, hydralazine and Coreg, imdur and Catapres with better control.     Anemia  -Secondary to ESRD, Aranesp per renal, management per nephrology.     Transaminitis  Likely due to hepatic  congestion from fluid overload, acute hepatitis panel negative, right upper quadrant ultrasound equivocal. Does not have right upper quadrant pain or tenderness. Liver enzymes trending down.    Thrombocytopenia   Noted on 03/11/2014, no bleeding, will remove heparin for DVT prophylaxis, too soon for HIT. We'll continue to monitor the trend.todays CBC pending. We have stopped all forms of heparin including during dialysis. Discussed with renal, platelets improving.  Lab Results  Component Value Date   PLT 45* 03/16/2014             Today   Subjective:   Matthew Beard today has no headache,no chest abdominal pain,no new weakness tingling or numbness, feels much better wants to go home today.    Objective:   Blood pressure 145/90, pulse 87, temperature 98.6 F (37 C), temperature source Oral, resp. rate 19, height 5\' 7"  (1.702 m), weight 81.012 kg (178 lb 9.6 oz), SpO2 100.00%.   Intake/Output Summary (Last 24 hours) at 03/16/14 1106 Last data filed at 03/15/14 2004  Gross per 24 hour  Intake    120 ml  Output   4500 ml  Net  -4380 ml    Exam Awake Alert, Oriented *3, No new F.N deficits, Normal affect [place].AT,PERRAL Supple Neck,No JVD, No cervical lymphadenopathy appriciated.  Symmetrical Chest wall movement, Good air movement bilaterally, CTAB RRR,No Gallops,Rubs or new Murmurs, No  Parasternal Heave +ve B.Sounds, Abd Soft, Non tender, No organomegaly appriciated, No rebound -guarding or rigidity. No Cyanosis, Clubbing, 1+ edema, No new Rash or bruise  Data Review   CBC w Diff: Lab Results  Component Value Date   WBC 6.2 03/16/2014   HGB 8.1* 03/16/2014   HCT 25.8* 03/16/2014   PLT 45* 03/16/2014   LYMPHOPCT 8* 03/08/2014   MONOPCT 10 03/08/2014   EOSPCT 2 03/08/2014   BASOPCT 0 03/08/2014    CMP: Lab Results  Component Value Date   NA 138 03/16/2014   K 3.9 03/16/2014   CL 97 03/16/2014   CO2 26 03/16/2014   BUN 42* 03/16/2014   CREATININE 4.70* 03/16/2014    CREATININE 2.41* 02/22/2012   PROT 7.2 03/16/2014   ALBUMIN 2.7* 03/16/2014   BILITOT 0.3 03/16/2014   ALKPHOS 99 03/16/2014   AST 21 03/16/2014   ALT 49 03/16/2014  .   Total Time in preparing paper work, data evaluation and todays exam - 35 minutes  Leroy SeaPrashant K Pierce Biagini M.D on 03/16/2014 at 11:06 AM  [facility] Office  [phone]

## 2014-03-16 NOTE — Discharge Instructions (Signed)
Follow with Primary MD [name], MD in 7 days   Get CBC, CMP, checked 7 days by Primary MD and again as instructed by your Primary MD.     Activity: As tolerated with Full fall precautions use walker/cane & assistance as needed   Disposition Home     Diet: Renal, Check your Weight same time everyday, if you gain over 2 pounds, or you develop in leg swelling, experience more shortness of breath or chest pain, call your Primary MD immediately. Follow Cardiac Low Salt Diet and 1.2 lit/day fluid restriction.   On your next visit with her primary care physician please Get Medicines reviewed and adjusted.  Please request your Prim.MD to go over all Hospital Tests and Procedure/Radiological results at the follow up, please get all Hospital records sent to your Prim MD by signing hospital release before you go home.   If you experience worsening of your admission symptoms, develop shortness of breath, life threatening emergency, suicidal or homicidal thoughts you must seek medical attention immediately by calling 911 or calling your MD immediately  if symptoms less severe.  You Must read complete instructions/literature along with all the possible adverse reactions/side effects for all the Medicines you take and that have been prescribed to you. Take any new Medicines after you have completely understood and accpet all the possible adverse reactions/side effects.   Do not drive and provide baby sitting services if your were admitted for syncope or siezures until you have seen by Primary MD or a Neurologist and advised to do so again.  Do not drive when taking Pain medications.    Do not take more than prescribed Pain, Sleep and Anxiety Medications  Special Instructions: If you have smoked or chewed Tobacco  in the last 2 yrs please stop smoking, stop any regular Alcohol  and or any Recreational drug use.  Wear Seat belts while driving.   Please note  You were cared for by a  hospitalist during your hospital stay. If you have any questions about your discharge medications or the care you received while you were in the hospital after you are discharged, you can call the unit and asked to speak with the hospitalist on call if the hospitalist that took care of you is not available. Once you are discharged, your primary care physician will handle any further medical issues. Please note that NO REFILLS for any discharge medications will be authorized once you are discharged, as it is imperative that you return to your primary care physician (or establish a relationship with a primary care physician if you do not have one) for your aftercare needs so that they can reassess your need for medications and monitor your lab values.

## 2014-03-16 NOTE — Progress Notes (Signed)
Patient discharge teaching given, including activity, diet, follow-up appoints, and medications. Patient verbalized understanding of all discharge instructions. IV access was d/c'd. Vitals are stable. Skin is intact except as charted in most recent assessments. Pt to be escorted out by NT, to be driven home by family.  Murel Wigle, MBA, BS, RN 

## 2014-03-29 ENCOUNTER — Encounter (HOSPITAL_COMMUNITY): Payer: Self-pay | Admitting: Emergency Medicine

## 2014-03-29 ENCOUNTER — Emergency Department (HOSPITAL_COMMUNITY): Payer: Medicaid Other

## 2014-03-29 ENCOUNTER — Inpatient Hospital Stay (HOSPITAL_COMMUNITY)
Admission: EM | Admit: 2014-03-29 | Discharge: 2014-04-21 | DRG: 064 | Disposition: A | Discharge status: Rehabilitation Facility | Payer: Medicaid Other | Attending: Internal Medicine | Admitting: Internal Medicine

## 2014-03-29 DIAGNOSIS — N039 Chronic nephritic syndrome with unspecified morphologic changes: Secondary | ICD-10-CM

## 2014-03-29 DIAGNOSIS — Z515 Encounter for palliative care: Secondary | ICD-10-CM

## 2014-03-29 DIAGNOSIS — J9600 Acute respiratory failure, unspecified whether with hypoxia or hypercapnia: Secondary | ICD-10-CM | POA: Diagnosis not present

## 2014-03-29 DIAGNOSIS — Z9114 Patient's other noncompliance with medication regimen: Secondary | ICD-10-CM

## 2014-03-29 DIAGNOSIS — F17200 Nicotine dependence, unspecified, uncomplicated: Secondary | ICD-10-CM | POA: Diagnosis present

## 2014-03-29 DIAGNOSIS — Z823 Family history of stroke: Secondary | ICD-10-CM

## 2014-03-29 DIAGNOSIS — D631 Anemia in chronic kidney disease: Secondary | ICD-10-CM | POA: Diagnosis present

## 2014-03-29 DIAGNOSIS — N189 Chronic kidney disease, unspecified: Secondary | ICD-10-CM

## 2014-03-29 DIAGNOSIS — G936 Cerebral edema: Secondary | ICD-10-CM | POA: Diagnosis present

## 2014-03-29 DIAGNOSIS — I43 Cardiomyopathy in diseases classified elsewhere: Secondary | ICD-10-CM | POA: Diagnosis present

## 2014-03-29 DIAGNOSIS — R1310 Dysphagia, unspecified: Secondary | ICD-10-CM | POA: Diagnosis present

## 2014-03-29 DIAGNOSIS — I509 Heart failure, unspecified: Secondary | ICD-10-CM | POA: Diagnosis present

## 2014-03-29 DIAGNOSIS — Z9119 Patient's noncompliance with other medical treatment and regimen: Secondary | ICD-10-CM

## 2014-03-29 DIAGNOSIS — I5031 Acute diastolic (congestive) heart failure: Secondary | ICD-10-CM

## 2014-03-29 DIAGNOSIS — Z9115 Patient's noncompliance with renal dialysis: Secondary | ICD-10-CM

## 2014-03-29 DIAGNOSIS — E872 Acidosis, unspecified: Secondary | ICD-10-CM | POA: Diagnosis present

## 2014-03-29 DIAGNOSIS — Z91158 Patient's noncompliance with renal dialysis for other reason: Secondary | ICD-10-CM

## 2014-03-29 DIAGNOSIS — J690 Pneumonitis due to inhalation of food and vomit: Secondary | ICD-10-CM | POA: Diagnosis present

## 2014-03-29 DIAGNOSIS — M949 Disorder of cartilage, unspecified: Secondary | ICD-10-CM

## 2014-03-29 DIAGNOSIS — M899 Disorder of bone, unspecified: Secondary | ICD-10-CM | POA: Diagnosis present

## 2014-03-29 DIAGNOSIS — I619 Nontraumatic intracerebral hemorrhage, unspecified: Principal | ICD-10-CM | POA: Diagnosis present

## 2014-03-29 DIAGNOSIS — A419 Sepsis, unspecified organism: Secondary | ICD-10-CM

## 2014-03-29 DIAGNOSIS — I679 Cerebrovascular disease, unspecified: Secondary | ICD-10-CM

## 2014-03-29 DIAGNOSIS — I10 Essential (primary) hypertension: Secondary | ICD-10-CM | POA: Diagnosis present

## 2014-03-29 DIAGNOSIS — Z8249 Family history of ischemic heart disease and other diseases of the circulatory system: Secondary | ICD-10-CM

## 2014-03-29 DIAGNOSIS — W19XXXA Unspecified fall, initial encounter: Secondary | ICD-10-CM | POA: Diagnosis present

## 2014-03-29 DIAGNOSIS — D649 Anemia, unspecified: Secondary | ICD-10-CM | POA: Diagnosis present

## 2014-03-29 DIAGNOSIS — I5043 Acute on chronic combined systolic (congestive) and diastolic (congestive) heart failure: Secondary | ICD-10-CM | POA: Diagnosis present

## 2014-03-29 DIAGNOSIS — I161 Hypertensive emergency: Secondary | ICD-10-CM

## 2014-03-29 DIAGNOSIS — J811 Chronic pulmonary edema: Secondary | ICD-10-CM

## 2014-03-29 DIAGNOSIS — Z881 Allergy status to other antibiotic agents status: Secondary | ICD-10-CM

## 2014-03-29 DIAGNOSIS — N2581 Secondary hyperparathyroidism of renal origin: Secondary | ICD-10-CM | POA: Diagnosis present

## 2014-03-29 DIAGNOSIS — IMO0001 Reserved for inherently not codable concepts without codable children: Secondary | ICD-10-CM | POA: Diagnosis present

## 2014-03-29 DIAGNOSIS — N186 End stage renal disease: Secondary | ICD-10-CM | POA: Diagnosis present

## 2014-03-29 DIAGNOSIS — R4182 Altered mental status, unspecified: Secondary | ICD-10-CM | POA: Diagnosis present

## 2014-03-29 DIAGNOSIS — D696 Thrombocytopenia, unspecified: Secondary | ICD-10-CM | POA: Diagnosis not present

## 2014-03-29 DIAGNOSIS — G9340 Encephalopathy, unspecified: Secondary | ICD-10-CM | POA: Diagnosis present

## 2014-03-29 DIAGNOSIS — G8190 Hemiplegia, unspecified affecting unspecified side: Secondary | ICD-10-CM | POA: Diagnosis present

## 2014-03-29 DIAGNOSIS — R4701 Aphasia: Secondary | ICD-10-CM | POA: Diagnosis present

## 2014-03-29 DIAGNOSIS — E875 Hyperkalemia: Secondary | ICD-10-CM | POA: Diagnosis present

## 2014-03-29 DIAGNOSIS — I674 Hypertensive encephalopathy: Secondary | ICD-10-CM | POA: Diagnosis present

## 2014-03-29 DIAGNOSIS — Y92009 Unspecified place in unspecified non-institutional (private) residence as the place of occurrence of the external cause: Secondary | ICD-10-CM

## 2014-03-29 DIAGNOSIS — N179 Acute kidney failure, unspecified: Secondary | ICD-10-CM

## 2014-03-29 DIAGNOSIS — R569 Unspecified convulsions: Secondary | ICD-10-CM | POA: Diagnosis present

## 2014-03-29 DIAGNOSIS — I429 Cardiomyopathy, unspecified: Secondary | ICD-10-CM | POA: Diagnosis present

## 2014-03-29 DIAGNOSIS — J9601 Acute respiratory failure with hypoxia: Secondary | ICD-10-CM | POA: Diagnosis present

## 2014-03-29 DIAGNOSIS — Z91199 Patient's noncompliance with other medical treatment and regimen due to unspecified reason: Secondary | ICD-10-CM

## 2014-03-29 LAB — I-STAT ARTERIAL BLOOD GAS, ED
Acid-base deficit: 2 mmol/L (ref 0.0–2.0)
BICARBONATE: 20 meq/L (ref 20.0–24.0)
O2 Saturation: 93 %
PCO2 ART: 28.4 mmHg — AB (ref 35.0–45.0)
PO2 ART: 69 mmHg — AB (ref 80.0–100.0)
Patient temperature: 101.9
TCO2: 21 mmol/L (ref 0–100)
pH, Arterial: 7.463 — ABNORMAL HIGH (ref 7.350–7.450)

## 2014-03-29 LAB — RAPID URINE DRUG SCREEN, HOSP PERFORMED
Amphetamines: NOT DETECTED
BARBITURATES: NOT DETECTED
Benzodiazepines: NOT DETECTED
COCAINE: NOT DETECTED
Opiates: NOT DETECTED
Tetrahydrocannabinol: NOT DETECTED

## 2014-03-29 LAB — COMPREHENSIVE METABOLIC PANEL
ALBUMIN: 2.9 g/dL — AB (ref 3.5–5.2)
ALK PHOS: 80 U/L (ref 39–117)
ALT: 19 U/L (ref 0–53)
AST: 20 U/L (ref 0–37)
BUN: 48 mg/dL — ABNORMAL HIGH (ref 6–23)
CO2: 20 mEq/L (ref 19–32)
Calcium: 8.4 mg/dL (ref 8.4–10.5)
Chloride: 99 mEq/L (ref 96–112)
Creatinine, Ser: 7.21 mg/dL — ABNORMAL HIGH (ref 0.50–1.35)
GFR calc Af Amer: 10 mL/min — ABNORMAL LOW (ref >90)
GFR calc non Af Amer: 9 mL/min — ABNORMAL LOW (ref >90)
Glucose, Bld: 94 mg/dL (ref 70–99)
POTASSIUM: 5.6 meq/L — AB (ref 3.7–5.3)
Sodium: 138 mEq/L (ref 137–147)
Total Bilirubin: 0.5 mg/dL (ref 0.3–1.2)
Total Protein: 7.8 g/dL (ref 6.0–8.3)

## 2014-03-29 LAB — CBC WITH DIFFERENTIAL/PLATELET
Basophils Absolute: 0.1 10*3/uL (ref 0.0–0.1)
Basophils Relative: 1 % (ref 0–1)
EOS PCT: 0 % (ref 0–5)
Eosinophils Absolute: 0 10*3/uL (ref 0.0–0.7)
HCT: 25.9 % — ABNORMAL LOW (ref 39.0–52.0)
Hemoglobin: 8.4 g/dL — ABNORMAL LOW (ref 13.0–17.0)
LYMPHS ABS: 0.5 10*3/uL — AB (ref 0.7–4.0)
LYMPHS PCT: 6 % — AB (ref 12–46)
MCH: 29.7 pg (ref 26.0–34.0)
MCHC: 32.4 g/dL (ref 30.0–36.0)
MCV: 91.5 fL (ref 78.0–100.0)
Monocytes Absolute: 0.7 10*3/uL (ref 0.1–1.0)
Monocytes Relative: 7 % (ref 3–12)
Neutro Abs: 8.4 10*3/uL — ABNORMAL HIGH (ref 1.7–7.7)
Neutrophils Relative %: 86 % — ABNORMAL HIGH (ref 43–77)
Platelets: 198 10*3/uL (ref 150–400)
RBC: 2.83 MIL/uL — AB (ref 4.22–5.81)
RDW: 17.2 % — ABNORMAL HIGH (ref 11.5–15.5)
WBC: 9.7 10*3/uL (ref 4.0–10.5)

## 2014-03-29 LAB — URINALYSIS, ROUTINE W REFLEX MICROSCOPIC
BILIRUBIN URINE: NEGATIVE
Glucose, UA: NEGATIVE mg/dL
Ketones, ur: NEGATIVE mg/dL
Leukocytes, UA: NEGATIVE
Nitrite: NEGATIVE
PH: 6.5 (ref 5.0–8.0)
Protein, ur: >300 mg/dL — AB
SPECIFIC GRAVITY, URINE: 1.019 (ref 1.005–1.030)
Urobilinogen, UA: 1 mg/dL (ref 0.0–1.0)

## 2014-03-29 LAB — CBG MONITORING, ED: GLUCOSE-CAPILLARY: 101 mg/dL — AB (ref 70–99)

## 2014-03-29 LAB — URINE MICROSCOPIC-ADD ON

## 2014-03-29 LAB — AMMONIA: Ammonia: 16 umol/L (ref 11–60)

## 2014-03-29 LAB — I-STAT CG4 LACTIC ACID, ED: LACTIC ACID, VENOUS: 2.71 mmol/L — AB (ref 0.5–2.2)

## 2014-03-29 MED ORDER — SUCCINYLCHOLINE CHLORIDE 20 MG/ML IJ SOLN
INTRAMUSCULAR | Status: AC
  Filled 2014-03-29: qty 1

## 2014-03-29 MED ORDER — NICARDIPINE HCL IN NACL 20-0.86 MG/200ML-% IV SOLN
3.0000 mg/h | Freq: Once | INTRAVENOUS | Status: AC
  Administered 2014-03-30: 5 mg/h via INTRAVENOUS
  Filled 2014-03-29 (×2): qty 200

## 2014-03-29 MED ORDER — VANCOMYCIN HCL 10 G IV SOLR: 1500.0000 mg | Freq: Once | INTRAVENOUS | Status: DC

## 2014-03-29 MED ORDER — LORAZEPAM 2 MG/ML IJ SOLN
INTRAMUSCULAR | Status: AC
  Filled 2014-03-29: qty 1

## 2014-03-29 MED ORDER — LORAZEPAM 2 MG/ML IJ SOLN
1.0000 mg | Freq: Once | INTRAMUSCULAR | Status: AC
  Administered 2014-03-29: 1 mg via INTRAVENOUS

## 2014-03-29 MED ORDER — LIDOCAINE HCL (CARDIAC) 20 MG/ML IV SOLN
INTRAVENOUS | Status: AC
  Filled 2014-03-29: qty 5

## 2014-03-29 MED ORDER — DEXTROSE 5 % IV SOLN
2.0000 g | Freq: Once | INTRAVENOUS | Status: AC
  Administered 2014-03-30: 2 g via INTRAVENOUS

## 2014-03-29 MED ORDER — ROCURONIUM BROMIDE 50 MG/5ML IV SOLN
INTRAVENOUS | Status: AC
  Administered 2014-03-30: 100 mg
  Filled 2014-03-29: qty 2

## 2014-03-29 MED ORDER — VANCOMYCIN HCL 10 G IV SOLR
1500.0000 mg | Freq: Once | INTRAVENOUS | Status: AC
  Administered 2014-03-30: 1500 mg via INTRAVENOUS
  Filled 2014-03-29: qty 1500

## 2014-03-29 MED ORDER — ETOMIDATE 2 MG/ML IV SOLN
INTRAVENOUS | Status: AC
  Administered 2014-03-29: 20 mg
  Filled 2014-03-29: qty 20

## 2014-03-29 NOTE — ED Provider Notes (Signed)
CSN: [id number]     Arrival date & time 03/29/14  2155 History   First MD Initiated Contact with Patient 03/29/14 2208     Chief Complaint  Patient presents with  . Loss of Consciousness     (Consider location/radiation/quality/duration/timing/severity/associated sxs/prior Treatment) Patient is a 38 y.o. male presenting with altered mental status. The history is provided by the EMS personnel.  Altered Mental Status Presenting symptoms: behavior changes, combativeness, confusion and lethargy   Severity:  Moderate Most recent episode:  Today Episode history:  Continuous Timing:  Constant Progression:  Unchanged Chronicity:  New Context: not taking medications as prescribed   Associated symptoms comment:  Unable to complete ROS   Past Medical History  Diagnosis Date  . Hypertension   . CKD (chronic kidney disease), stage IV   . Normocytic anemia   . Pericardial effusion     Remote hx  . Cerebrovascular disease     Chronic lacunar infarcts  . NICM (nonischemic cardiomyopathy)   . CHF (congestive heart failure)   . Shortness of breath    Past Surgical History  Procedure Laterality Date  . Av fistula placement Left 10/19/2013    Procedure: ARTERIOVENOUS (AV) FISTULA CREATION- LEFT RADIOCEPHALIC;  Surgeon: Larina Earthly, MD;  Location: [facility] OR;  Service: Vascular;  Laterality: Left;   Family History  Problem Relation Age of Onset  . Hypertension Mother   . Stroke Mother   . Heart disease Mother     Heart Disease before age 79  . Hypertension Father   . Stroke Father   . Heart disease Father     Heart Disease before age 65  . Hypertension Brother    History  Substance Use Topics  . Smoking status: Never Smoker   . Smokeless tobacco: Never Used  . Alcohol Use: No    Review of Systems  Unable to perform ROS: Mental status change  Psychiatric/Behavioral: Positive for confusion.      Allergies  Amoxicillin  Home Medications   Prior to Admission medications    Medication Sig Start Date End Date Taking? Authorizing Provider  amLODipine (NORVASC) 10 MG tablet Take 1 tablet (10 mg total) by mouth daily. 03/16/14  Yes Leroy Sea, MD  carvedilol (COREG) 25 MG tablet Take 1 tablet (25 mg total) by mouth 2 (two) times daily with a meal. 03/16/14  Yes Leroy Sea, MD  cloNIDine (CATAPRES) 0.2 MG tablet Take 1 tablet (0.2 mg total) by mouth 3 (three) times daily. 03/16/14  Yes Leroy Sea, MD  furosemide (LASIX) 80 MG tablet Take 80 mg by mouth 2 (two) times daily.   Yes Historical Provider, MD  hydrALAZINE (APRESOLINE) 100 MG tablet Take 1 tablet (100 mg total) by mouth 3 (three) times daily. 03/16/14  Yes Leroy Sea, MD  isosorbide mononitrate (IMDUR) 30 MG 24 hr tablet Take 1 tablet (30 mg total) by mouth daily. 03/16/14  Yes Leroy Sea, MD  metolazone (ZAROXOLYN) 5 MG tablet Take 5 mg by mouth daily.   Yes Historical Provider, MD  potassium chloride (K-DUR) 10 MEQ tablet Take 10 mEq by mouth daily.   Yes Historical Provider, MD  sevelamer carbonate (RENVELA) 800 MG tablet Take 2 tablets (1,600 mg total) by mouth 3 (three) times daily with meals. 03/16/14  Yes Leroy Sea, MD   BP 209/142  Pulse 126  Temp(Src) 101.9 F (38.8 C) (Rectal)  Resp 30  SpO2 100% Physical Exam  Constitutional: He appears listless. He  appears distressed.  Eyes: Pupils are equal, round, and reactive to light.  Cardiovascular:  No extrasystoles are present. Tachycardia present.   Pulmonary/Chest: Tachypnea noted. He has rales.  Abdominal: He exhibits distension. There is hepatomegaly. There is no tenderness.  Neurological: He appears listless. He is disoriented. GCS eye subscore is 3. GCS verbal subscore is 3. GCS motor subscore is 5.  Skin: Skin is warm. He is diaphoretic.  Psychiatric: His speech is delayed and slurred. He is agitated. Cognition and memory are impaired.    ED Course  CRITICAL CARE Performed by: Lyndal Pulley Authorized by:  Enid Skeens Total critical care time: 30 minutes Critical care time was exclusive of separately billable procedures and treating other patients. Critical care was necessary to treat or prevent imminent or life-threatening deterioration of the following conditions: CNS failure or compromise. Critical care was time spent personally by me on the following activities: development of treatment plan with patient or surrogate, discussions with consultants, interpretation of cardiac output measurements, evaluation of patient's response to treatment, examination of patient, obtaining history from patient or surrogate, ordering and review of laboratory studies, ordering and review of radiographic studies, ordering and performing treatments and interventions, re-evaluation of patient's condition and ventilator management.  INTUBATION Date/Time: 03/30/2014 12:00 AM Performed by: Lyndal Pulley Authorized by: Enid Skeens Consent: The procedure was performed in an emergent situation. Patient identity confirmed: arm band, provided demographic data and hospital-assigned identification number Indications: airway protection Intubation method: direct Patient status: paralyzed (RSI) Preoxygenation: nonrebreather mask and BVM Sedatives: etomidate Paralytic: rocuronium Laryngoscope size: Mac 4 Tube size: 7.5 mm Tube type: cuffed Number of attempts: 1 Cricoid pressure: no Cords visualized: yes Post-procedure assessment: chest rise and CO2 detector Breath sounds: equal Cuff inflated: yes ETT to lip: 25 cm Tube secured with: ETT holder Chest x-ray interpreted by me. Chest x-ray findings: endotracheal tube in appropriate position Patient tolerance: Patient tolerated the procedure well with no immediate complications.   (including critical care time) Labs Review Labs Reviewed  CBC WITH DIFFERENTIAL - Abnormal; Notable for the following:    RBC 2.83 (*)    Hemoglobin 8.4 (*)    HCT 25.9 (*)     RDW 17.2 (*)    Neutrophils Relative % 86 (*)    Neutro Abs 8.4 (*)    Lymphocytes Relative 6 (*)    Lymphs Abs 0.5 (*)    All other components within normal limits  URINALYSIS, ROUTINE W REFLEX MICROSCOPIC - Abnormal; Notable for the following:    APPearance CLOUDY (*)    Hgb urine dipstick LARGE (*)    Protein, ur >300 (*)    All other components within normal limits  COMPREHENSIVE METABOLIC PANEL - Abnormal; Notable for the following:    Potassium 5.6 (*)    BUN 48 (*)    Creatinine, Ser 7.21 (*)    Albumin 2.9 (*)    GFR calc non Af Amer 9 (*)    GFR calc Af Amer 10 (*)    All other components within normal limits  URINE MICROSCOPIC-ADD ON - Abnormal; Notable for the following:    Casts HYALINE CASTS (*)    All other components within normal limits  CBG MONITORING, ED - Abnormal; Notable for the following:    Glucose-Capillary 101 (*)    All other components within normal limits  I-STAT CG4 LACTIC ACID, ED - Abnormal; Notable for the following:    Lactic Acid, Venous 2.71 (*)    All other  components within normal limits  I-STAT ARTERIAL BLOOD GAS, ED - Abnormal; Notable for the following:    pH, Arterial 7.463 (*)    pCO2 arterial 28.4 (*)    pO2, Arterial 69.0 (*)    All other components within normal limits  CULTURE, BLOOD (ROUTINE X 2)  CULTURE, BLOOD (ROUTINE X 2)  URINE CULTURE  AMMONIA  URINE RAPID DRUG SCREEN (HOSP PERFORMED)  BLOOD GAS, ARTERIAL    Imaging Review Ct Head Wo Contrast  03/29/2014   CLINICAL DATA:  Confusion  EXAM: CT HEAD WITHOUT CONTRAST  TECHNIQUE: Contiguous axial images were obtained from the base of the skull through the vertex without intravenous contrast.  COMPARISON:  None.  FINDINGS: The examination is significantly limited by patient motion artifact. Mild atrophic changes and chronic white matter ischemic changes are seen. There is a focus of parenchymal hemorrhage at the junction of the left temporal and left parietal lobes which  measures 1.6 x 1.6 cm. No other focal abnormality is noted.  IMPRESSION: Significantly limited exam by patient motion artifact.  A focus of hemorrhage is noted as described above at the junction of the left temporal and parietal lobes. Mild surrounding edema is noted.  Critical Value/emergent results were called by telephone at the time of interpretation on 03/29/2014 at 11:48 PM to the emergency room physician, who verbally acknowledged these results.   Electronically Signed   By: Alcide Clever M.D.   On: 03/29/2014 23:48   Dg Chest Port 1 View  03/30/2014   CLINICAL DATA:  Status post intubation  EXAM: PORTABLE CHEST - 1 VIEW  COMPARISON:  03/29/2014  FINDINGS: Cardiac shadow remains enlarged. Endotracheal tube is now noted 4.6 cm above the carina. Increasing infiltrative changes are noted bilaterally but particularly in the left mid lung when compared with the prior study. No acute bony abnormality is seen.  IMPRESSION: Endotracheal tube as described.  Increasing bilateral infiltrative changes.   Electronically Signed   By: Alcide Clever M.D.   On: 03/30/2014 00:21   Dg Chest Port 1 View  03/29/2014   CLINICAL DATA:  Loss of consciousness.  Dialysis patient.  EXAM: PORTABLE CHEST - 1 VIEW  COMPARISON:  DG CHEST 1V PORT dated 03/08/2014  FINDINGS: Cardiac enlargement without vascular congestion or edema. No focal consolidation or airspace disease. No blunting of costophrenic angles. No pneumothorax. No significant change since prior study.  IMPRESSION: Cardiac enlargement.  No evidence of active pulmonary disease.   Electronically Signed   By: Burman Nieves M.D.   On: 03/29/2014 22:56     EKG Interpretation None      MDM   Final diagnoses:  Sepsis  ESRD (end stage renal disease)  Acute encephalopathy  Hypertensive emergency  Cerebral brain hemorrhage  Altered mental status    38 y.o. male presents with altered mental status, hypertension, and reported missing dialysis for unknown amount of  time. Pt had "shaking episode" per EMS report, losing consciousness, but no other information available as no family at bedside. The patient was febrile on arrival to 101.9 rectally some initial concern was for infection or fluid overload in the setting of possible missed dialysis treatments, appears septic. Blood cultures were drawn and a chest x-ray was performed, the patient did not have any pulmonary edema evident despite the history of end-stage renal and CHF. Blood gas was appropriate, metabolic profile showed no significant abnormality to suggest need for temporizing. EKG showed no extreme peaking of T.'s, QTC was prolonged but no  documented arrhythmias on telemetry, tox screen was drawn. At this point critical care was contacted for evaluation of the patient for admission.  Patient was sent to head CT for unexplained hypertension and continued altered mental status. An area of hemorrhage was noted in the left temporal parietal lobes with surrounding edema. The patient was intubated as he was combative and confused, requiring multiple medications to control his blood pressure. Family was able to be contacted from the emergency department and relayed the story of the patient having a seizure episode after becoming altered tonight and having increased lethargy and pacing around the house the last few days.  Neurosurgery was consulted for evaluation of the patient's case and they recommended no acute neurosurgical intervention as there was no midline shift or mass effect but they were unsure if the patient had a solely hypertensive bleed and recommended further workup with neurology as an inpatient for characterization of the lesion with possible MRI. Critical care team available at bedside in the emergency Department for an evaluation, the patient will be admitted to their service for further workup and management.     Lyndal Pulleyaniel Nyair Depaulo, MD 03/30/14 [phone]

## 2014-03-29 NOTE — ED Notes (Signed)
Pt from home. Wife states pt started shaking and fell backwards and lost consciousness for a minute. Wife reports pt is a dialysis pt and has a fistula on left arm. Wife reports pt is noncompliant with his meds and dialysis. Pt confused and moaning at this time. EMS reports pt's O2 sats 89% on room air and 94% on 2 L vial [place].

## 2014-03-30 ENCOUNTER — Inpatient Hospital Stay (HOSPITAL_COMMUNITY): Payer: Medicaid Other

## 2014-03-30 ENCOUNTER — Ambulatory Visit: Payer: Medicaid Other | Admitting: Cardiology

## 2014-03-30 ENCOUNTER — Emergency Department (HOSPITAL_COMMUNITY): Payer: Medicaid Other

## 2014-03-30 DIAGNOSIS — J811 Chronic pulmonary edema: Secondary | ICD-10-CM

## 2014-03-30 DIAGNOSIS — A419 Sepsis, unspecified organism: Secondary | ICD-10-CM

## 2014-03-30 DIAGNOSIS — G9340 Encephalopathy, unspecified: Secondary | ICD-10-CM

## 2014-03-30 DIAGNOSIS — J9600 Acute respiratory failure, unspecified whether with hypoxia or hypercapnia: Secondary | ICD-10-CM

## 2014-03-30 DIAGNOSIS — I619 Nontraumatic intracerebral hemorrhage, unspecified: Secondary | ICD-10-CM

## 2014-03-30 DIAGNOSIS — I369 Nonrheumatic tricuspid valve disorder, unspecified: Secondary | ICD-10-CM

## 2014-03-30 DIAGNOSIS — N186 End stage renal disease: Secondary | ICD-10-CM

## 2014-03-30 DIAGNOSIS — J9601 Acute respiratory failure with hypoxia: Secondary | ICD-10-CM | POA: Diagnosis present

## 2014-03-30 LAB — TRIGLYCERIDES
Triglycerides: 130 mg/dL (ref <150)
Triglycerides: 135 mg/dL (ref <150)

## 2014-03-30 LAB — BLOOD GAS, ARTERIAL
Acid-base deficit: 3.1 mmol/L — ABNORMAL HIGH (ref 0.0–2.0)
Acid-base deficit: 2.9 mmol/L — ABNORMAL HIGH (ref 0.0–2.0)
BICARBONATE: 20.5 meq/L (ref 20.0–24.0)
Bicarbonate: 20.7 mEq/L (ref 20.0–24.0)
DRAWN BY: 312761
Drawn by: 252031
FIO2: 1 %
FIO2: 1 %
LHR: 14 {breaths}/min
MECHVT: 580 mL
MECHVT: 580 mL
O2 Content: 1 L/min
O2 SAT: 77.2 %
O2 Saturation: 100 %
PCO2 ART: 31.8 mmHg — AB (ref 35.0–45.0)
PEEP/CPAP: 10 cmH2O
PEEP: 5 cmH2O
PH ART: 7.429 (ref 7.350–7.450)
PO2 ART: 46.6 mmHg — AB (ref 80.0–100.0)
Patient temperature: 98.6
Patient temperature: 98.6
RATE: 14 resp/min
TCO2: 21.5 mmol/L (ref 0–100)
TCO2: 21.7 mmol/L (ref 0–100)
pCO2 arterial: 31.4 mmHg — ABNORMAL LOW (ref 35.0–45.0)
pH, Arterial: 7.431 (ref 7.350–7.450)
pO2, Arterial: 329 mmHg — ABNORMAL HIGH (ref 80.0–100.0)

## 2014-03-30 LAB — BASIC METABOLIC PANEL
BUN: 54 mg/dL — ABNORMAL HIGH (ref 6–23)
CO2: 19 meq/L (ref 19–32)
Calcium: 8.3 mg/dL — ABNORMAL LOW (ref 8.4–10.5)
Chloride: 101 mEq/L (ref 96–112)
Creatinine, Ser: 7.43 mg/dL — ABNORMAL HIGH (ref 0.50–1.35)
GFR calc Af Amer: 10 mL/min — ABNORMAL LOW (ref >90)
GFR calc non Af Amer: 8 mL/min — ABNORMAL LOW (ref >90)
GLUCOSE: 93 mg/dL (ref 70–99)
POTASSIUM: 5.4 meq/L — AB (ref 3.7–5.3)
SODIUM: 137 meq/L (ref 137–147)

## 2014-03-30 LAB — MAGNESIUM: Magnesium: 2.2 mg/dL (ref 1.5–2.5)

## 2014-03-30 LAB — I-STAT ARTERIAL BLOOD GAS, ED
Acid-base deficit: 2 mmol/L (ref 0.0–2.0)
Bicarbonate: 22 mEq/L (ref 20.0–24.0)
O2 Saturation: 98 %
PO2 ART: 102 mmHg — AB (ref 80.0–100.0)
Patient temperature: 100.2
TCO2: 23 mmol/L (ref 0–100)
pCO2 arterial: 33.9 mmHg — ABNORMAL LOW (ref 35.0–45.0)
pH, Arterial: 7.424 (ref 7.350–7.450)

## 2014-03-30 LAB — MRSA PCR SCREENING: MRSA by PCR: NEGATIVE

## 2014-03-30 LAB — APTT: aPTT: 37 seconds (ref 24–37)

## 2014-03-30 LAB — CBC
HCT: 24.7 % — ABNORMAL LOW (ref 39.0–52.0)
HEMOGLOBIN: 8 g/dL — AB (ref 13.0–17.0)
MCH: 29.3 pg (ref 26.0–34.0)
MCHC: 32.4 g/dL (ref 30.0–36.0)
MCV: 90.5 fL (ref 78.0–100.0)
Platelets: 212 10*3/uL (ref 150–400)
RBC: 2.73 MIL/uL — AB (ref 4.22–5.81)
RDW: 17.2 % — ABNORMAL HIGH (ref 11.5–15.5)
WBC: 8.5 10*3/uL (ref 4.0–10.5)

## 2014-03-30 LAB — GLUCOSE, CAPILLARY
GLUCOSE-CAPILLARY: 110 mg/dL — AB (ref 70–99)
GLUCOSE-CAPILLARY: 87 mg/dL (ref 70–99)
Glucose-Capillary: 92 mg/dL (ref 70–99)
Glucose-Capillary: 73 mg/dL (ref 70–99)
Glucose-Capillary: 76 mg/dL (ref 70–99)

## 2014-03-30 LAB — CBG MONITORING, ED: Glucose-Capillary: 71 mg/dL (ref 70–99)

## 2014-03-30 LAB — TROPONIN I
TROPONIN I: 0.45 ng/mL — AB (ref <0.30)
TROPONIN I: 0.51 ng/mL — AB (ref <0.30)
Troponin I: 0.56 ng/mL (ref <0.30)

## 2014-03-30 LAB — PHOSPHORUS: Phosphorus: 5.6 mg/dL — ABNORMAL HIGH (ref 2.3–4.6)

## 2014-03-30 LAB — PROTIME-INR
INR: 1.3 (ref 0.00–1.49)
Prothrombin Time: 15.9 seconds — ABNORMAL HIGH (ref 11.6–15.2)

## 2014-03-30 MED ORDER — IOHEXOL 350 MG/ML SOLN
50.0000 mL | Freq: Once | INTRAVENOUS | Status: AC | PRN
  Administered 2014-03-30: 50 mL via INTRAVENOUS

## 2014-03-30 MED ORDER — SODIUM CHLORIDE 0.9 % IV SOLN: 250.0000 mL | INTRAVENOUS | Status: DC | PRN

## 2014-03-30 MED ORDER — PROPOFOL 10 MG/ML IV EMUL
5.0000 ug/kg/min | Freq: Once | INTRAVENOUS | Status: AC
  Administered 2014-03-30: 50 ug/kg/min via INTRAVENOUS

## 2014-03-30 MED ORDER — VITAL AF 1.2 CAL PO LIQD
1000.0000 mL | ORAL | Status: DC
  Filled 2014-03-30 (×4): qty 1000

## 2014-03-30 MED ORDER — FENTANYL CITRATE 0.05 MG/ML IJ SOLN: 100.0000 ug | INTRAMUSCULAR | Status: DC | PRN

## 2014-03-30 MED ORDER — DOXERCALCIFEROL 4 MCG/2ML IV SOLN
4.0000 ug | INTRAVENOUS | Status: DC
  Administered 2014-03-31: 4 ug via INTRAVENOUS
  Filled 2014-03-30 (×3): qty 2

## 2014-03-30 MED ORDER — LEVETIRACETAM 500 MG PO TABS: 1000.0000 mg | ORAL_TABLET | Freq: Once | ORAL | Status: DC

## 2014-03-30 MED ORDER — PROPOFOL 10 MG/ML IV EMUL
INTRAVENOUS | Status: AC
  Filled 2014-03-30: qty 100

## 2014-03-30 MED ORDER — NICARDIPINE HCL IN NACL 20-0.86 MG/200ML-% IV SOLN: 3.0000 mg/h | INTRAVENOUS | Status: DC

## 2014-03-30 MED ORDER — PANTOPRAZOLE SODIUM 40 MG PO PACK
40.0000 mg | PACK | Freq: Every day | ORAL | Status: DC
  Administered 2014-03-30 – 2014-04-03 (×5): 40 mg
  Filled 2014-03-30 (×6): qty 20

## 2014-03-30 MED ORDER — SODIUM CHLORIDE 0.9 % IV SOLN
125.0000 mg | INTRAVENOUS | Status: DC
  Administered 2014-03-31: 125 mg via INTRAVENOUS
  Filled 2014-03-30 (×3): qty 10

## 2014-03-30 MED ORDER — PROPOFOL 10 MG/ML IV EMUL
0.0000 ug/kg/min | INTRAVENOUS | Status: DC
Start: 2014-03-30 — End: 2014-04-02
  Administered 2014-03-30: 50 ug/kg/min via INTRAVENOUS
  Administered 2014-03-30: 20 ug/kg/min via INTRAVENOUS
  Administered 2014-03-30: 25 ug/kg/min via INTRAVENOUS
  Administered 2014-03-30: 50 ug/kg/min via INTRAVENOUS
  Administered 2014-03-31: 20 ug/kg/min via INTRAVENOUS
  Administered 2014-03-31: 50 ug/kg/min via INTRAVENOUS
  Administered 2014-03-31: 25 ug/kg/min via INTRAVENOUS
  Administered 2014-03-31: 5 ug/kg/min via INTRAVENOUS
  Administered 2014-03-31: 20 ug/kg/min via INTRAVENOUS
  Administered 2014-03-31: 5 ug/kg/min via INTRAVENOUS
  Administered 2014-03-31: 10 ug/kg/min via INTRAVENOUS
  Administered 2014-03-31: 20 ug/kg/min via INTRAVENOUS
  Administered 2014-03-31: 50 ug/kg/min via INTRAVENOUS
  Administered 2014-03-31: 10 ug/kg/min via INTRAVENOUS
  Administered 2014-03-31: 5 ug/kg/min via INTRAVENOUS
  Administered 2014-03-31 – 2014-04-01 (×2): 20 ug/kg/min via INTRAVENOUS
  Filled 2014-03-30 (×8): qty 100

## 2014-03-30 MED ORDER — CLONIDINE HCL 0.2 MG PO TABS
0.2000 mg | ORAL_TABLET | Freq: Three times a day (TID) | ORAL | Status: DC
  Administered 2014-03-30 – 2014-03-31 (×4): 0.2 mg
  Filled 2014-03-30 (×6): qty 1

## 2014-03-30 MED ORDER — SODIUM CHLORIDE 0.9 % IV SOLN
250.0000 mg | Freq: Two times a day (BID) | INTRAVENOUS | Status: DC
  Administered 2014-03-30 – 2014-03-31 (×3): 250 mg via INTRAVENOUS
  Filled 2014-03-30 (×4): qty 250

## 2014-03-30 MED ORDER — NICARDIPINE HCL IN NACL 20-0.86 MG/200ML-% IV SOLN
3.0000 mg/h | INTRAVENOUS | Status: DC
  Administered 2014-03-30: 10 mg/h via INTRAVENOUS
  Filled 2014-03-30 (×2): qty 200

## 2014-03-30 MED ORDER — NICARDIPINE HCL IN NACL 40-0.83 MG/200ML-% IV SOLN
3.0000 mg/h | INTRAVENOUS | Status: DC
  Administered 2014-03-30: 5 mg/h via INTRAVENOUS
  Filled 2014-03-30: qty 200

## 2014-03-30 MED ORDER — PROPOFOL 10 MG/ML IV EMUL: 0.0000 ug/kg/min | INTRAVENOUS | Status: DC

## 2014-03-30 MED ORDER — SODIUM CHLORIDE 0.9 % IV SOLN
500.0000 mg | Freq: Two times a day (BID) | INTRAVENOUS | Status: DC
  Filled 2014-03-30: qty 500

## 2014-03-30 MED ORDER — FENTANYL CITRATE 0.05 MG/ML IJ SOLN
50.0000 ug | INTRAMUSCULAR | Status: DC | PRN
  Administered 2014-03-31 – 2014-04-01 (×3): 100 ug via INTRAVENOUS
  Administered 2014-04-02 (×2): 200 ug via INTRAVENOUS
  Administered 2014-04-02: 100 ug via INTRAVENOUS
  Filled 2014-03-30: qty 2
  Filled 2014-03-30: qty 4
  Filled 2014-03-30 (×3): qty 2
  Filled 2014-03-30: qty 4

## 2014-03-30 MED ORDER — BIOTENE DRY MOUTH MT LIQD
15.0000 mL | Freq: Four times a day (QID) | OROMUCOSAL | Status: DC
  Administered 2014-03-30 – 2014-04-02 (×13): 15 mL via OROMUCOSAL

## 2014-03-30 MED ORDER — PANTOPRAZOLE SODIUM 40 MG IV SOLR: 40.0000 mg | Freq: Every day | INTRAVENOUS | Status: DC

## 2014-03-30 MED ORDER — SODIUM CHLORIDE 0.9 % IV SOLN
500.0000 mg | Freq: Two times a day (BID) | INTRAVENOUS | Status: DC
  Administered 2014-03-30 – 2014-04-09 (×21): 500 mg via INTRAVENOUS
  Filled 2014-03-30 (×23): qty 5

## 2014-03-30 MED ORDER — HYDRALAZINE HCL 50 MG PO TABS
100.0000 mg | ORAL_TABLET | Freq: Three times a day (TID) | ORAL | Status: DC
  Administered 2014-03-30 – 2014-03-31 (×4): 100 mg
  Filled 2014-03-30 (×6): qty 2

## 2014-03-30 MED ORDER — CARVEDILOL 25 MG PO TABS
25.0000 mg | ORAL_TABLET | Freq: Two times a day (BID) | ORAL | Status: DC
  Administered 2014-03-30 – 2014-04-05 (×13): 25 mg
  Filled 2014-03-30 (×16): qty 1

## 2014-03-30 MED ORDER — FENTANYL CITRATE 0.05 MG/ML IJ SOLN
100.0000 ug | INTRAMUSCULAR | Status: DC | PRN
  Filled 2014-03-30: qty 2

## 2014-03-30 MED ORDER — INSULIN ASPART 100 UNIT/ML ~~LOC~~ SOLN: 0.0000 [IU] | SUBCUTANEOUS | Status: DC

## 2014-03-30 MED ORDER — DARBEPOETIN ALFA-POLYSORBATE 100 MCG/0.5ML IJ SOLN
100.0000 ug | INTRAMUSCULAR | Status: DC
  Administered 2014-03-31: 100 ug via INTRAVENOUS
  Filled 2014-03-30 (×2): qty 0.5

## 2014-03-30 MED ORDER — NICARDIPINE HCL IN NACL 40-0.83 MG/200ML-% IV SOLN
3.0000 mg/h | INTRAVENOUS | Status: DC
  Filled 2014-03-30 (×2): qty 200

## 2014-03-30 MED ORDER — CHLORHEXIDINE GLUCONATE 0.12 % MT SOLN
15.0000 mL | Freq: Two times a day (BID) | OROMUCOSAL | Status: DC
  Administered 2014-03-30 – 2014-04-03 (×9): 15 mL via OROMUCOSAL
  Filled 2014-03-30 (×9): qty 15

## 2014-03-30 NOTE — Progress Notes (Signed)
PULMONARY / CRITICAL CARE MEDICINE   Name: Matthew Beard MRN: [id number] DOB: 1976/01/02   PCP [name], MD   ADMISSION DATE:  03/29/2014 CONSULTATION DATE:  03/30/14  REFERRING MD :  EDP PRIMARY SERVICE: PCCM  CHIEF COMPLAINT:  Cerebral hemorrhage  BRIEF PATIENT DESCRIPTION:    Pt is sedated/altered; therefore, this HPI is obtained from chart review. [name] is a 38 y.o. M with PMH significant for ESRD (non-compliant with dialysis), HTN, anemia, NICM, CHF, SOB, who presented to ED on 5/11 after he was at home and per ED notes, fell backwards and began to shake.  His fiances children witnessed the event and called her.  He was in his USOH prior to this episode although fiance notes that his health has not been very good of late.   In ED, initial BP was 209/142 and got as high as 249/144.  Head CT was obtained and revealed a small focal hemorrhage at junction of left parietal and temporal lobes. Pt was intubated due to agitation/attempting to pull at lines/confusion/concern for lack of ability to protect airway. Per pt's fiance, he is non-compliant with his home meds as well as dialysis and she believes that he was supposed to have dialysis today, however, missed it. Of note, pt was recently discharged from [facility] on 4/28 after he was admitted and treated for volume overload and found to have ESRD.   SIGNIFICANT EVENTS / STUDIES:  5/11 - fell backwards at home and began shaking, questionable loss of consciousness. 5/12 Head CT >>> small hemorrhage at junction of left temporal and parietal lobes.  Intubated by EDP for airway protection.  LINES / TUBES: OETT 5/12 >>> OGT 5/12 >>> Foley 5/12 >>>  CULTURES: Sputum 5/12 >>>  ANTIBIOTICS: Unasyn 5/12 >>>   SUBJECTIVE/OVERNIGHT/INTERVAL HX   03/30/14: Dr Pearlean Brownie recommends SBP 120-160 as goal. Patient very hypertensive and agitated. CT angio head ordered .  SUBJECTIVE: Remains hypertensive on propofol and cardene.  Intubated by  EDP.  VITAL SIGNS: Temp:  [86.7 F (30.4 C)-101.9 F (38.8 C)] 99.8 F (37.7 C) (05/12 1000) Pulse Rate:  [53-227] 101 (05/12 1000) Resp:  [19-30] 25 (05/12 1000) BP: (137-249)/(58-171) 183/84 mmHg (05/12 1000) SpO2:  [70 %-100 %] 99 % (05/12 1000) FiO2 (%):  [60 %-100 %] 60 % (05/12 1000) Weight:  [81 kg (178 lb 9.2 oz)-82.5 kg (181 lb 14.1 oz)] 82.5 kg (181 lb 14.1 oz) (05/12 0444) HEMODYNAMICS:   VENTILATOR SETTINGS: Vent Mode:  [-] PRVC FiO2 (%):  [60 %-100 %] 60 % Set Rate:  [14 bmp] 14 bmp Vt Set:  [580 mL] 580 mL PEEP:  [5 cmH20-10 cmH20] 5 cmH20 Plateau Pressure:  [24 cmH20-31 cmH20] 24 cmH20 INTAKE / OUTPUT: Intake/Output     05/11 0701 - 05/12 0700 05/12 0701 - 05/13 0700   I.V. (mL/kg) 846 (10.3) 405 (4.9)   IV Piggyback 100    Total Intake(mL/kg) 946 (11.5) 405 (4.9)   Urine (mL/kg/hr)  65 (0.2)   Total Output   65   Net +946 +340          PHYSICAL EXAMINATION: General: Young AA male, , hypertensive. Neuro:  Intubated/sedated on propofol, RASS -3 but intermittently +3,puils 38mm BERTL  HEENT: [place]/AT. PERRL, sclerae anicteric. Cardiovascular: tachy but regular, no M/R/G.  Lungs: Respirations even and unlabored on full vent support.  CTA bilaterally, No W/R/R.  Abdomen: BS x 4, soft, NT/ND.  Musculoskeletal: No gross deformities, 2+ edema to LE's bilaterally.  Fistula noted  LUE. Skin: Intact, warm, no rashes.    LABS: PULMONARY  Recent Labs Lab 03/29/14 2307 03/30/14 0122 03/30/14 0422 03/30/14 0559  PHART 7.463* 7.424 7.431 7.429  PCO2ART 28.4* 33.9* 31.4* 31.8*  PO2ART 69.0* 102.0* 46.6* 329.0*  HCO3 20.0 22.0 20.5 20.7  TCO2 21 23 21.5 21.7  O2SAT 93.0 98.0 77.2 100.0    CBC  Recent Labs Lab 03/29/14 2255 03/30/14 0637  HGB 8.4* 8.0*  HCT 25.9* 24.7*  WBC 9.7 8.5  PLT 198 212    COAGULATION  Recent Labs Lab 03/30/14 0052  INR 1.30    CARDIAC   Recent Labs Lab 03/30/14 0637  TROPONINI 0.45*   No results found for this  basename: PROBNP,  in the last 168 hours   CHEMISTRY  Recent Labs Lab 03/29/14 2255 03/30/14 0637  NA 138 137  K 5.6* 5.4*  CL 99 101  CO2 20 19  GLUCOSE 94 93  BUN 48* 54*  CREATININE 7.21* 7.43*  CALCIUM 8.4 8.3*  MG  --  2.2  PHOS  --  5.6*   Estimated Creatinine Clearance: 13.9 ml/min (by C-G formula based on Cr of 7.43).   LIVER  Recent Labs Lab 03/29/14 2255 03/30/14 0052  AST 20  --   ALT 19  --   ALKPHOS 80  --   BILITOT 0.5  --   PROT 7.8  --   ALBUMIN 2.9*  --   INR  --  1.30     INFECTIOUS  Recent Labs Lab 03/29/14 2314  LATICACIDVEN 2.71*     ENDOCRINE CBG (last 3)   Recent Labs  03/30/14 0203 03/30/14 0326 03/30/14 0831  GLUCAP 71 110* 87         IMAGING x48h  Ct Head Wo Contrast  03/29/2014   CLINICAL DATA:  Confusion  EXAM: CT HEAD WITHOUT CONTRAST  TECHNIQUE: Contiguous axial images were obtained from the base of the skull through the vertex without intravenous contrast.  COMPARISON:  None.  FINDINGS: The examination is significantly limited by patient motion artifact. Mild atrophic changes and chronic white matter ischemic changes are seen. There is a focus of parenchymal hemorrhage at the junction of the left temporal and left parietal lobes which measures 1.6 x 1.6 cm. No other focal abnormality is noted.  IMPRESSION: Significantly limited exam by patient motion artifact.  A focus of hemorrhage is noted as described above at the junction of the left temporal and parietal lobes. Mild surrounding edema is noted.  Critical Value/emergent results were called by telephone at the time of interpretation on 03/29/2014 at 11:48 PM to the emergency room physician, who verbally acknowledged these results.   Electronically Signed   By: Alcide CleverMark  Lukens M.D.   On: 03/29/2014 23:48   Dg Chest Port 1 View  03/30/2014   CLINICAL DATA:  Status post intubation  EXAM: PORTABLE CHEST - 1 VIEW  COMPARISON:  03/29/2014  FINDINGS: Cardiac shadow remains  enlarged. Endotracheal tube is now noted 4.6 cm above the carina. Increasing infiltrative changes are noted bilaterally but particularly in the left mid lung when compared with the prior study. No acute bony abnormality is seen.  IMPRESSION: Endotracheal tube as described.  Increasing bilateral infiltrative changes.   Electronically Signed   By: Alcide CleverMark  Lukens M.D.   On: 03/30/2014 00:21   Dg Chest Port 1 View  03/29/2014   CLINICAL DATA:  Loss of consciousness.  Dialysis patient.  EXAM: PORTABLE CHEST - 1 VIEW  COMPARISON:  DG CHEST 1V PORT dated 03/08/2014  FINDINGS: Cardiac enlargement without vascular congestion or edema. No focal consolidation or airspace disease. No blunting of costophrenic angles. No pneumothorax. No significant change since prior study.  IMPRESSION: Cardiac enlargement.  No evidence of active pulmonary disease.   Electronically Signed   By: Burman Nieves M.D.   On: 03/29/2014 22:56      ASSESSMENT / PLAN:  NEUROLOGIC A:  ICH - likely secondary to significantly elevated BP in the setting of hypertensive emergency. Questionable seizure. Urine tox negative.   - intermittently agitated P:   - Neurosurgery and neurology consulted. - Keppra. - Propofol gtt / Fentanyl PRN for sedation.; track lactic and ck - WUA in AM.  CARDIOVASCULAR A:  Hypertensive Emergency - BP up to as high as 249/144 (MAP 164) Hx of non-ischemic cardiomyopathy.  - hypertensive needing cardene despite 3 home bp mds, ? NSTEMI V high trop due to IC hge  P:  - SBP goal 120-160 per neuro -  home carvedilol, clonidine, hydralazine. - Cardene gtt if needed  PULMONARY A: VDRF - in the setting of ICH. Concern for aspiration.  - on 40% fio2 P:   - Full vent support. - VAP bundle. - Empiric abx - Unasyn. Marland Kitchen  RENAL A:  ESRD Mild hyperkalemia. P:   - Nephrology consult. - BMP in AM.  GASTROINTESTINAL A:   NPO P:   - SUP: Pantoprazole. - TF start  HEMATOLOGIC A:   No acute  issues. P:  - VTE prophylaxis:  SCD's only. - CBC in AM.  INFECTIOUS A:   Concern for aspiration. P:   - Empiric Unasyn. - Monitor fever curve / WBC's.  ENDOCRINE A:   No acute issues. P:   - CBG's q4. - SSI.  Updated brothers and sisters at bedside    The patient is critically ill with multiple organ systems failure and requires high complexity decision making for assessment and support, frequent evaluation and titration of therapies, application of advanced monitoring technologies and extensive interpretation of multiple databases.   Critical Care Time devoted to patient care services described in this note is  45 additional  Minutes.  Dr. Kalman Shan, M.D., [facility].C.P Pulmonary and Critical Care Medicine Staff Physician [facility] [place] [facility] Pager: [phone], If no answer or between  15:00h - 7:00h: call [phone]  03/30/2014 10:35 AM

## 2014-03-30 NOTE — Consult Note (Signed)
Indication for Consultation:  Management of ESRD/hemodialysis; anemia, hypertension/volume and secondary hyperparathyroidism  HPI: Matthew Beard is a 38 y.o. male admitted to the ICU for cerebral hemorrhage. He receives HD MWF @ [facility], history of uncontrolled HTN, ischemic cardiomyopathy with EF 20%, pulmonary HTN. He has been noncompliant with HD and BP meds, last HD 5/8. Per his fiance pt was pacing a lot yesterday and slightly sob, has sob at baseline, he was standing at the couch last night when he fell back and lost consciousness and she believes he had a seizure. EMS was called and he was brought to the ED, upon arrival BP elevated to 249/144. Head CT revealed hemorrhage at the junction ofthe left temporal and left parietal lobes. He was intubated and admitted to the ICU for further eval and management. He was recently discharged 4/28 for volume overload, HD was initiated 4/20.     Past Medical History  Diagnosis Date  . Hypertension   . CKD (chronic kidney disease), stage IV   . Normocytic anemia   . Pericardial effusion     Remote hx  . Cerebrovascular disease     Chronic lacunar infarcts  . NICM (nonischemic cardiomyopathy)   . CHF (congestive heart failure)   . Shortness of breath    Past Surgical History  Procedure Laterality Date  . Av fistula placement Left 10/19/2013    Procedure: ARTERIOVENOUS (AV) FISTULA CREATION- LEFT RADIOCEPHALIC;  Surgeon: Larina Earthlyodd F Early, MD;  [facility];  Service: Vascular;  Laterality: Left;   Family History  Problem Relation Age of Onset  . Hypertension Mother   . Stroke Mother   . Heart disease Mother     Heart Disease before age 39  . Hypertension Father   . Stroke Father   . Heart disease Father     Heart Disease before age 39  . Hypertension Brother    Social History:  reports that he has never smoked. He has never used smokeless tobacco. He reports that he does not drink alcohol or use illicit drugs. Family reports pt smokes,  does not drive alcohol.  Allergies  Allergen Reactions  . Amoxicillin Hives   Prior to Admission medications   Medication Sig Start Date End Date Taking? Authorizing Provider  amLODipine (NORVASC) 10 MG tablet Take 1 tablet (10 mg total) by mouth daily. 03/16/14  Yes Leroy SeaPrashant K Singh, MD  carvedilol (COREG) 25 MG tablet Take 1 tablet (25 mg total) by mouth 2 (two) times daily with a meal. 03/16/14  Yes Leroy SeaPrashant K Singh, MD  cloNIDine (CATAPRES) 0.2 MG tablet Take 1 tablet (0.2 mg total) by mouth 3 (three) times daily. 03/16/14  Yes Leroy SeaPrashant K Singh, MD  furosemide (LASIX) 80 MG tablet Take 80 mg by mouth 2 (two) times daily.   Yes Historical Provider, MD  hydrALAZINE (APRESOLINE) 100 MG tablet Take 1 tablet (100 mg total) by mouth 3 (three) times daily. 03/16/14  Yes Leroy SeaPrashant K Singh, MD  isosorbide mononitrate (IMDUR) 30 MG 24 hr tablet Take 1 tablet (30 mg total) by mouth daily. 03/16/14  Yes Leroy SeaPrashant K Singh, MD  metolazone (ZAROXOLYN) 5 MG tablet Take 5 mg by mouth daily.   Yes Historical Provider, MD  potassium chloride (K-DUR) 10 MEQ tablet Take 10 mEq by mouth daily.   Yes Historical Provider, MD  sevelamer carbonate (RENVELA) 800 MG tablet Take 2 tablets (1,600 mg total) by mouth 3 (three) times daily with meals. 03/16/14  Yes Leroy SeaPrashant K Singh, MD  Current Facility-Administered Medications  Medication Dose Route Frequency Provider Last Rate Last Dose  . 0.9 %  sodium chloride infusion  250 mL Intravenous PRN Oretha Milch, MD      . antiseptic oral rinse (BIOTENE) solution 15 mL  15 mL Mouth Rinse QID Nelda Bucks, MD   15 mL at 03/30/14 1146  . carvedilol (COREG) tablet 25 mg  25 mg Per Tube BID WC Oretha Milch, MD   25 mg at 03/30/14 0900  . chlorhexidine (PERIDEX) 0.12 % solution 15 mL  15 mL Mouth Rinse BID Nelda Bucks, MD   15 mL at 03/30/14 [phone]  . cloNIDine (CATAPRES) tablet 0.2 mg  0.2 mg Per Tube TID Oretha Milch, MD   0.2 mg at 03/30/14 1000  . fentaNYL (SUBLIMAZE)  injection 50-200 mcg  50-200 mcg Intravenous Q2H PRN Kalman Shan, MD      . hydrALAZINE (APRESOLINE) tablet 100 mg  100 mg Per Tube TID Oretha Milch, MD   100 mg at 03/30/14 1000  . imipenem-cilastatin (PRIMAXIN) 250 mg in sodium chloride 0.9 % 100 mL IVPB  250 mg Intravenous Q12H Colleen Can, RPH   250 mg at 03/30/14 0509  . insulin aspart (novoLOG) injection 0-15 Units  0-15 Units Subcutaneous 6 times per day Oretha Milch, MD      . levETIRAcetam (KEPPRA) 500 mg in sodium chloride 0.9 % 100 mL IVPB  500 mg Intravenous Q12H Oretha Milch, MD   500 mg at 03/30/14 0430  . levETIRAcetam (KEPPRA) tablet 1,000 mg  1,000 mg Oral Once Wyatt Portela, MD      . niCARdipine (CARDENE-IV) double-strength infusion (0.2 mg/ml)  3-15 mg/hr Intravenous Continuous Kalman Shan, MD   2.5 mg/hr at 03/30/14 1146  . propofol (DIPRIVAN) 10 mg/ml infusion  0-50 mcg/kg/min Intravenous Continuous Oretha Milch, MD 12.2 mL/hr at 03/30/14 1230 25 mcg/kg/min at 03/30/14 1230   Labs: Basic Metabolic Panel:  Recent Labs Lab 03/29/14 2255 03/30/14 0637  NA 138 137  K 5.6* 5.4*  CL 99 101  CO2 20 19  GLUCOSE 94 93  BUN 48* 54*  CREATININE 7.21* 7.43*  CALCIUM 8.4 8.3*  PHOS  --  5.6*   Liver Function Tests:  Recent Labs Lab 03/29/14 2255  AST 20  ALT 19  ALKPHOS 80  BILITOT 0.5  PROT 7.8  ALBUMIN 2.9*   No results found for this basename: LIPASE, AMYLASE,  in the last 168 hours  Recent Labs Lab 03/29/14 2255  AMMONIA 16   CBC:  Recent Labs Lab 03/29/14 2255 03/30/14 0637  WBC 9.7 8.5  NEUTROABS 8.4*  --   HGB 8.4* 8.0*  HCT 25.9* 24.7*  MCV 91.5 90.5  PLT 198 212   Cardiac Enzymes:  Recent Labs Lab 03/30/14 0637 03/30/14 1035  TROPONINI 0.45* 0.56*   CBG:  Recent Labs Lab 03/29/14 2254 03/30/14 0203 03/30/14 0326 03/30/14 0831 03/30/14 1145  GLUCAP 101* 71 110* 87 92   Iron Studies: No results found for this basename: IRON, TIBC, TRANSFERRIN,  FERRITIN,  in the last 72 hours Studies/Results: Ct Angio Head W/cm &/or Wo Cm  03/30/2014   CLINICAL DATA:  Intracranial hemorrhage.  Rule out aneurysm.  EXAM: CT ANGIOGRAPHY HEAD  TECHNIQUE: Multidetector CT imaging of the head was performed using the standard protocol during bolus administration of intravenous contrast. Multiplanar CT image reconstructions and MIPs were obtained to evaluate the vascular anatomy.  CONTRAST:  50mL OMNIPAQUE  IOHEXOL 350 MG/ML SOLN  COMPARISON:  Head CT 03/29/2014  FINDINGS: 2.0 x 1.6 cm acute parenchymal hemorrhage in the posterior left temporal lobe with mild surrounding edema does not appear significantly changed. There is no evidence of new intracranial hemorrhage. Patchy hypoattenuation in the pons is unchanged from 06/29/2013, compatible with chronic ischemic change. Old lacunar infarcts are noted in the thalami. Moderate periventricular white matter chronic small vessel ischemic disease is unchanged. Remote infarct is also noted adjacent to the left frontal MRI. No abnormal enhancement is identified. Ventricles are mildly prominent for age, unchanged. There is no midline shift or extra-axial fluid collection. Orbits are unremarkable. Mild right frontal sinus mucosal thickening is noted. There is a small amount of right maxillary sinus fluid.  The visualized distal vertebral arteries are patent with the left being dominant. PICA origins are patent. AICA and SCA origins are patent. Basilar artery is patent without stenosis. There is a fetal origin of the left PCA with a hypoplastic left P1 segment. Patent right posterior communicating artery is not identified. There is mild bilateral PCA branch vessel irregularity without significant proximal stenosis.  Internal carotid arteries are patent from skull base to carotid termini. There is mild ACA and MCA branch vessel irregularity bilaterally without evidence of significant proximal stenosis. No intracranial aneurysm is  identified.  Review of the MIP images confirms the above findings.  IMPRESSION: 1. No intracranial aneurysm identified. 2. Mild anterior and posterior circulation branches vessel irregularity, suggestive of mild intracranial atherosclerosis. No evidence of significant proximal stenosis. 3. Unchanged, acute left temporal lobe parenchymal hemorrhage.   Electronically Signed   By: Sebastian Ache   On: 03/30/2014 11:48   Ct Head Wo Contrast  03/29/2014   CLINICAL DATA:  Confusion  EXAM: CT HEAD WITHOUT CONTRAST  TECHNIQUE: Contiguous axial images were obtained from the base of the skull through the vertex without intravenous contrast.  COMPARISON:  None.  FINDINGS: The examination is significantly limited by patient motion artifact. Mild atrophic changes and chronic white matter ischemic changes are seen. There is a focus of parenchymal hemorrhage at the junction of the left temporal and left parietal lobes which measures 1.6 x 1.6 cm. No other focal abnormality is noted.  IMPRESSION: Significantly limited exam by patient motion artifact.  A focus of hemorrhage is noted as described above at the junction of the left temporal and parietal lobes. Mild surrounding edema is noted.  Critical Value/emergent results were called by telephone at the time of interpretation on 03/29/2014 at 11:48 PM to the emergency room physician, who verbally acknowledged these results.   Electronically Signed   By: Alcide Clever M.D.   On: 03/29/2014 23:48   Dg Chest Port 1 View  03/30/2014   CLINICAL DATA:  Status post intubation  EXAM: PORTABLE CHEST - 1 VIEW  COMPARISON:  03/29/2014  FINDINGS: Cardiac shadow remains enlarged. Endotracheal tube is now noted 4.6 cm above the carina. Increasing infiltrative changes are noted bilaterally but particularly in the left mid lung when compared with the prior study. No acute bony abnormality is seen.  IMPRESSION: Endotracheal tube as described.  Increasing bilateral infiltrative changes.    Electronically Signed   By: Alcide Clever M.D.   On: 03/30/2014 00:21   Dg Chest Port 1 View  03/29/2014   CLINICAL DATA:  Loss of consciousness.  Dialysis patient.  EXAM: PORTABLE CHEST - 1 VIEW  COMPARISON:  DG CHEST 1V PORT dated 03/08/2014  FINDINGS: Cardiac enlargement without vascular congestion or  edema. No focal consolidation or airspace disease. No blunting of costophrenic angles. No pneumothorax. No significant change since prior study.  IMPRESSION: Cardiac enlargement.  No evidence of active pulmonary disease.   Electronically Signed   By: Burman Nieves M.D.   On: 03/29/2014 22:56     Review of Systems: Unable to obtain, pt sedated on vent. Per family pt still with some sob  Physical Exam: Filed Vitals:   03/30/14 1145 03/30/14 1206 03/30/14 1215 03/30/14 1230  BP: 104/49  104/48 105/47  Pulse: 83  81 80  Temp: 100.3 F (37.9 C) 99.4 F (37.4 C)    TempSrc:  Oral    Resp: 28  0 0  Height:      Weight:      SpO2: 96%  96% 96%     General:  Sedated, in no acute distress.  Head: Normocephalic, atraumatic, sclera non-icteric, mucus membranes are moist Neck: Supple. JVD not elevated. Lungs: Clear bilaterally to auscultation without wheezes, rales, or rhonchi. Breathing is unlabored. On vent Heart: RRR with S1 S2. No murmurs, rubs, or gallops appreciated. Abdomen: Soft, round, non-tender, non-distended with normoactive bowel sounds. No rebound/guarding. No obvious abdominal masses. M-S:  Strength and tone appear normal for age. Lower extremities: +2 LE edema  Neuro: sedated Dialysis Access: L AVF +bruit  Dialysis Orders:  MWF @ [facility] 79kgs  4hr  2K/2.25 Ca+   300/800   8000 Heparin   L aVF  hectorol 4 mcg IV/HD Aranesp 100 Q wends  Venofer 100mg  x10 HD stop 5/20- for Tsat 12%, and Ferritin 334  Assessment/Plan: 1. Cerebral hemorrhage-  r/t HTN. Head CT-  hemorrhage at the junction ofthe left temporal and left parietal lobes. Neuro consulted.  ECHO results pending. No  heparin in HD  2.  ESRD -  MWF @ [facility], last HD 5/8. Noncompliant with tx. HD pending tomorrow. 3. ? Aspiration- cont Primaxin. Sputum culture pending. Per primary 4. ? Sepsis- Tmax 101.9. Blood cultures pending. Zosyn and vanc per pharmacy.   3. Hypertension/volume  -BP as high as 249/144 required cardene gtt. SBP goal 120-160 per neuro. clonidine, coreg, hydralzine, isosorbide and norvasc outpt- has not been taking. No pulm edema on xray. May need lower EDW at DC 5.  Anemia  - hgb 8- cont aranesp and Fe. Watch cbc 6.  Metabolic bone disease -  Ca+ 8.3. Phos 5.6. Cont hectorol. Renvela when diet. 7.  Nutrition - Alb 2.9. NPO. multivit when diet advanced 8. Noncompliance- chronic issue.   Jetty Duhamel, NP [employer] [phone] 03/30/2014, 1:01 PM

## 2014-03-30 NOTE — Progress Notes (Signed)
UR Completed.  Matthew Beard Gotcher [phone] 03/30/2014  

## 2014-03-30 NOTE — ED Provider Notes (Signed)
Medical screening examination/treatment/procedure(s) were conducted as a shared visit with non-physician practitioner(s) or resident and myself. I personally evaluated the patient during the encounter and agree with the findings and plan unless otherwise indicated.  I have personally reviewed any xrays and/ or EKG's with the provider and I agree with interpretation.  Patient with high blood pressure, kidney disease on dialysis, CHF/prior myopathy presents with loss of consciousness and confusion. Patient had shaking episode followed by brief loss of consciousness prior to arrival. Patient has been noncompliant with medications in the past. Patient unable to provide me with details due to confusion. On exam patient has warm skin, abdomen soft nontender, few sparse rales on bases of left lung, moves all extremities equal with 5+ strength, difficult to discern verbal, confused supple neck, mild agitation, pupils equal bilateral. Broad differential including hypertensive encephalopathy, infectious, CNS bleed, other. Patient has a fever the ER and with dialysis history possible source of altered mental status. Blood pressure significantly elevated in ED and CT scan showed new acute bleed likely from uncontrolled high blood pressure.  Patient's mental status continued to worsen and we felt he was no longer protecting his airway especially with the bleed on the CT scan. Patient intubated emergently in the ER with no significant difficulty. I was present for the entire resuscitation with the resident and intubation. Broad abx given for possible sepsis.  INTUBATION Performed by: Enid Skeens  Required items: required blood products, implants, devices, and special equipment available  Patient identity confirmed: provided demographic data and hospital-assigned identification number  Indications: altered, CNS bleed  Intubation method: direct  Preoxygenation: BVM  Sedatives: Etomidate Paralytic: roc Tube Size:  7.5 cuffed  Post-procedure assessment: chest rise and ETCO2 monitor  Breath sounds: equal and absent over the epigastrium  Tube secured with: ETT holder  Chest x-ray interpreted by me.  Chest x-ray findings: endotracheal tube in appropriate position  Patient tolerated the procedure well with no immediate complications.  CRITICAL CARE Performed by: Enid Skeens  Total critical care time: 40 min  Critical care time was exclusive of separately billable procedures and treating other patients.  Critical care was necessary to treat or prevent imminent or life-threatening deterioration.  Critical care was time spent personally by me on the following activities: development of treatment plan with patient and/or surrogate as well as nursing, discussions with consultants, evaluation of patient's response to treatment, examination of patient, obtaining history from patient or surrogate, ordering and performing treatments and interventions, ordering and review of laboratory studies, ordering and review of radiographic studies, pulse oximetry and re-evaluation of patient's condition.  HTN encephalopathy, AMS, HTN emergency, ESRD, CNS bleed, Sepsis   Enid Skeens, MD 03/30/14 [phone]

## 2014-03-30 NOTE — Progress Notes (Signed)
  Echocardiogram 2D Echocardiogram has been performed.  Matthew Beard 03/30/2014, 2:55 PM

## 2014-03-30 NOTE — Progress Notes (Addendum)
38yo male had witnessed sz at home, hypertensive to 240s in ED, CT reveals small ICH, pt now intubated, concern for aspiration PNA given sz, to begin IV ABX.  Of note pt is non-compliant with meds and HD.  Will begin Primaxin 250mg  IV Q12H and monitor CBC and Cx.  Vernard Gambles, PharmD, BCPS 03/30/2014 4:02 AM

## 2014-03-30 NOTE — Progress Notes (Signed)
INITIAL NUTRITION ASSESSMENT  DOCUMENTATION CODES Per approved criteria  -Not Applicable   INTERVENTION: 1.  Enteral nutrition; Day 1 of TF protocol start feed at 25 mL/hr for the remainder of the day.   Day 2 of TF protocol at 0600 start new goal rate of 1680 mL (70 mL/hr) run from 0600-05:59.   Maintain at rate ordered unless TF's are held. If TF's are held, calculate the new volume to be provided and adjust the rate to provide total volume ordered within 24 hours (total volume-volume fed, divide this by remaining hours in the 24 hr period to get new goal rate). Max rate of 150 mL/hr. Each day at 0600 return to ordered rate.  Nutrition provision: TF (goal) + propofol at current rate= 2346 kcal, 126g protein, 1377 mL free water  NUTRITION DIAGNOSIS: Inadequate oral intake related to inability to eat as evidenced by intubated, NPO.   Monitor:  1.  Enteral nutrition; initiation with tolerance.  Pt to meet >/=90% estimated needs with nutrition support.  2.  Wt/wt change; monitor trends  Reason for Assessment: Vent, consult for TF initiation and management  38 y.o. male  Admitting Dx: cerebral hemorrhage  ASSESSMENT: Pt admitted with convulsions at home.  Pt found to have small focal hemorrhage at junction of left parietal and temporal lobes. Pt was intubated due to agitation.  Pt has ESRD on HD at home; family reports he is not compliant.  Patient is currently intubated on ventilator support MV: 14.9 L/min Temp (24hrs), Avg:98.6 F (37 C), Min:86.7 F (30.4 C), Max:101.9 F (38.8 C)  Propofol: 12.5 ml/hr providing 330 kcal/d.   RD notes pt with some abdominal distention and hypoactive bowel sounds.  Monitor for tolerance of nutrition support.   Nutrition Focused Physical Exam: Subcutaneous Fat:  Orbital Region: WNL Upper Arm Region: WNL Thoracic and Lumbar Region: WNL  Muscle:  Temple Region: WNL Clavicle Bone Region: WNL Clavicle and Acromion Bone Region:  WNL Scapular Bone Region: WNL Dorsal Hand: not assessed, restraints Patellar Region: WNL Anterior Thigh Region: WNL Posterior Calf Region: WNL  Edema: none present  No family at beside for additional nutrition hx.   Height: Ht Readings from Last 1 Encounters:  03/30/14 5\' 10"  (1.778 m)    Weight: Wt Readings from Last 1 Encounters:  03/30/14 181 lb 14.1 oz (82.5 kg)    Ideal Body Weight: 75.4 kg  % Ideal Body Weight: 109%  Wt Readings from Last 10 Encounters:  03/30/14 181 lb 14.1 oz (82.5 kg)  03/15/14 178 lb 9.6 oz (81.012 kg)  02/26/14 180 lb 9 oz (81.903 kg)  02/26/14 182 lb (82.555 kg)  02/14/14 171 lb (77.565 kg)  01/04/14 165 lb (74.844 kg)  12/01/13 173 lb (78.472 kg)  11/16/13 165 lb (74.844 kg)  10/06/13 167 lb (75.751 kg)  09/28/13 160 lb (72.576 kg)    Usual Body Weight: 165-180 lbs  % Usual Body Weight: 100%  BMI:  Body mass index is 26.1 kg/(m^2).  Estimated Nutritional Needs: Kcal: 2411 Protein: 110-130g Fluid: per MD discretion  Skin: intact  Diet Order: NPO  EDUCATION NEEDS: -Education not appropriate at this time   Intake/Output Summary (Last 24 hours) at 03/30/14 1301 Last data filed at 03/30/14 1230  Gross per 24 hour  Intake 1480.96 ml  Output     75 ml  Net 1405.96 ml    Last BM: PTA   Labs:   Recent Labs Lab 03/29/14 2255 03/30/14 0637  NA 138 137  K 5.6* 5.4*  CL 99 101  CO2 20 19  BUN 48* 54*  CREATININE 7.21* 7.43*  CALCIUM 8.4 8.3*  MG  --  2.2  PHOS  --  5.6*  GLUCOSE 94 93    CBG (last 3)   Recent Labs  03/30/14 0326 03/30/14 0831 03/30/14 1145  GLUCAP 110* 87 92    Scheduled Meds: . antiseptic oral rinse  15 mL Mouth Rinse QID  . carvedilol  25 mg Per Tube BID WC  . chlorhexidine  15 mL Mouth Rinse BID  . cloNIDine  0.2 mg Per Tube TID  . hydrALAZINE  100 mg Per Tube TID  . imipenem-cilastatin  250 mg Intravenous Q12H  . insulin aspart  0-15 Units Subcutaneous 6 times per day  .  levETIRAcetam  500 mg Intravenous Q12H  . levETIRAcetam  1,000 mg Oral Once    Continuous Infusions: . niCARDipine Stopped (03/30/14 1200)  . propofol 25 mcg/kg/min (03/30/14 1230)    Past Medical History  Diagnosis Date  . Hypertension   . CKD (chronic kidney disease), stage IV   . Normocytic anemia   . Pericardial effusion     Remote hx  . Cerebrovascular disease     Chronic lacunar infarcts  . NICM (nonischemic cardiomyopathy)   . CHF (congestive heart failure)   . Shortness of breath     Past Surgical History  Procedure Laterality Date  . Av fistula placement Left 10/19/2013    Procedure: ARTERIOVENOUS (AV) FISTULA CREATION- LEFT RADIOCEPHALIC;  Surgeon: Larina Earthlyodd F Early, MD;  [facility];  Service: Vascular;  Laterality: Left;    Loyce DysKacie Zia Kanner, MS RD LDN Clinical Inpatient Dietitian Pager: [phone] Weekend/After hours pager: [phone]

## 2014-03-30 NOTE — Procedures (Signed)
Central Venous Catheter Insertion Procedure Note Matthew Beard [id number] Aug 17, 1976  Procedure: Insertion of Central Venous Catheter Indications: Dialysis access  Procedure Details Consent: Risks of procedure as well as the alternatives and risks of each were explained to the (patient/caregiver).  Consent for procedure obtained. Time Out: Verified patient identification, verified procedure, site/side was marked, verified correct patient position, special equipment/implants available, medications/allergies/relevent history reviewed, required imaging and test results available.  Performed  Maximum sterile technique was used including antiseptics, cap, gloves, gown, hand hygiene, mask and sheet. Skin prep: Chlorhexidine; local anesthetic administered A antimicrobial bonded/coated triple lumen catheter was placed in the right internal jugular vein using the Seldinger technique.  Evaluation Blood flow good Complications: No apparent complications Patient did tolerate procedure well. Chest X-ray ordered to verify placement.  CXR: pending.  Trialysis catheter placed for HD access.    U/S used in placement.  Matthew Beard 03/30/2014, 5:39 PM

## 2014-03-30 NOTE — Progress Notes (Signed)
HD cath placement confirmed by MD Byrum. Notified MD Byrum of absent bowel sounds and taught abdomen. Will hold tube feeds and put OG tube to LWS.  [name]

## 2014-03-30 NOTE — Consult Note (Signed)
I have seen and examined this patient and agree with the plan of care , will discuss with ICU and decide about CVVHD [name] 03/30/2014, 3:45 PM

## 2014-03-30 NOTE — Consult Note (Signed)
NEURO HOSPITALIST CONSULT NOTE   Referring Physician: Dr. Elsworth Soho    Chief Complaint: ICH  HPI:                                                                                                                                         Matthew Beard is an 38 y.o. 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 Patient was home with his children when suddenly collapsed to the ground and had a generalized convulsion. EMS called and patient brought to the ED where  He was confused with SBP 249. Urgent CT brain revealed a focus of parenchymal hemorrhage at the junction of the left temporal and left parietal lobes which measures 1.6 x 1.6 cm.Mild surrounding edema but no mass effect or IV extension. PTT 37. INR 1.30. Platelets 198 Patient was not protecting his airway and was intubated in the ED. Loaded with 1 gram IV keppra. Nicardipine infusion started.  Date last known well: 03/29/14  Time last known well: unclear tPA Given: no, ICH   Past Medical History  Diagnosis Date  . Hypertension   . CKD (chronic kidney disease), stage IV   . Normocytic anemia   . Pericardial effusion     Remote hx  . Cerebrovascular disease     Chronic lacunar infarcts  . NICM (nonischemic cardiomyopathy)   . CHF (congestive heart failure)   . Shortness of breath     Past Surgical History  Procedure Laterality Date  . Av fistula placement Left 10/19/2013    Procedure: ARTERIOVENOUS (AV) FISTULA CREATION- LEFT RADIOCEPHALIC;  Surgeon: Rosetta Posner, MD;  Location: [facility] OR;  Service: Vascular;  Laterality: Left;    Family History  Problem Relation Age of Onset  . Hypertension Mother   . Stroke Mother   . Heart disease Mother     Heart Disease before age 79  . Hypertension Father   . Stroke  Father   . Heart disease Father     Heart Disease before age 42  . Hypertension Brother    Social History:  reports that he has never smoked. He has never used smokeless tobacco. He reports that he does not drink alcohol or use illicit drugs.  Allergies:  Allergies  Allergen Reactions  . Amoxicillin Hives    Medications:  I have reviewed the patient's current medications.  ROS: unable to obtain due to mental status, intubation                                                                                                  History obtained from chart review and family.  Physical exam: sedated with propofol, intubated on the vent..Blood pressure 184/86, pulse 108, temperature 100.4 F (38 C), temperature source Rectal, resp. rate 22, height 5' 10"  (1.778 m), weight 81 kg (178 lb 9.2 oz), SpO2 100.00%. Head: normocephalic. Neck: supple, no bruits, no JVD. Cardiac: no murmurs. Lungs: clear. Abdomen: soft, no tender, no mass. Extremities: bilateral LE edema.  Neurologic Examination:                                                                                                      Mental status: on propofol CN 2-12" pupils 3-4 mm bilaterally, reactive. No gaze preference. No nystagmus. Corneal reflex no tested on propofol. Tongue: intubated. Motor: on propofol. Sensory: no reaction to pain but on propofol. DTR's: unable to elicit, on propofol Coordination and gait: unable to test.  Results for orders placed during the hospital encounter of 03/29/14 (from the past 48 hour(s))  URINALYSIS, ROUTINE W REFLEX MICROSCOPIC     Status: Abnormal   Collection Time    03/29/14 10:51 PM      Result Value Ref Range   Color, Urine YELLOW  YELLOW   APPearance CLOUDY (*) CLEAR   Specific Gravity, Urine 1.019  1.005 - 1.030   pH 6.5  5.0 - 8.0   Glucose, UA NEGATIVE   NEGATIVE mg/dL   Hgb urine dipstick LARGE (*) NEGATIVE   Bilirubin Urine NEGATIVE  NEGATIVE   Ketones, ur NEGATIVE  NEGATIVE mg/dL   Protein, ur >300 (*) NEGATIVE mg/dL   Urobilinogen, UA 1.0  0.0 - 1.0 mg/dL   Nitrite NEGATIVE  NEGATIVE   Leukocytes, UA NEGATIVE  NEGATIVE  URINE RAPID DRUG SCREEN (HOSP PERFORMED)     Status: None   Collection Time    03/29/14 10:51 PM      Result Value Ref Range   Opiates NONE DETECTED  NONE DETECTED   Cocaine NONE DETECTED  NONE DETECTED   Benzodiazepines NONE DETECTED  NONE DETECTED   Amphetamines NONE DETECTED  NONE DETECTED   Tetrahydrocannabinol NONE DETECTED  NONE DETECTED   Barbiturates NONE DETECTED  NONE DETECTED   Comment:            DRUG SCREEN FOR MEDICAL PURPOSES     ONLY.  IF CONFIRMATION IS NEEDED     FOR ANY PURPOSE, NOTIFY LAB     WITHIN 5 DAYS.  LOWEST DETECTABLE LIMITS     FOR URINE DRUG SCREEN     Drug Class       Cutoff (ng/mL)     Amphetamine      1000     Barbiturate      200     Benzodiazepine   321     Tricyclics       224     Opiates          300     Cocaine          300     THC              50  URINE MICROSCOPIC-ADD ON     Status: Abnormal   Collection Time    03/29/14 10:51 PM      Result Value Ref Range   Squamous Epithelial / LPF RARE  RARE   WBC, UA 0-2  <3 WBC/hpf   RBC / HPF 11-20  <3 RBC/hpf   Bacteria, UA RARE  RARE   Casts HYALINE CASTS (*) NEGATIVE   Urine-Other AMORPHOUS URATES/PHOSPHATES    CBG MONITORING, ED     Status: Abnormal   Collection Time    03/29/14 10:54 PM      Result Value Ref Range   Glucose-Capillary 101 (*) 70 - 99 mg/dL  CBC WITH DIFFERENTIAL     Status: Abnormal   Collection Time    03/29/14 10:55 PM      Result Value Ref Range   WBC 9.7  4.0 - 10.5 K/uL   RBC 2.83 (*) 4.22 - 5.81 MIL/uL   Hemoglobin 8.4 (*) 13.0 - 17.0 g/dL   HCT 25.9 (*) 39.0 - 52.0 %   MCV 91.5  78.0 - 100.0 fL   MCH 29.7  26.0 - 34.0 pg   MCHC 32.4  30.0 - 36.0 g/dL   RDW 17.2 (*)  11.5 - 15.5 %   Platelets 198  150 - 400 K/uL   Neutrophils Relative % 86 (*) 43 - 77 %   Neutro Abs 8.4 (*) 1.7 - 7.7 K/uL   Lymphocytes Relative 6 (*) 12 - 46 %   Lymphs Abs 0.5 (*) 0.7 - 4.0 K/uL   Monocytes Relative 7  3 - 12 %   Monocytes Absolute 0.7  0.1 - 1.0 K/uL   Eosinophils Relative 0  0 - 5 %   Eosinophils Absolute 0.0  0.0 - 0.7 K/uL   Basophils Relative 1  0 - 1 %   Basophils Absolute 0.1  0.0 - 0.1 K/uL  COMPREHENSIVE METABOLIC PANEL     Status: Abnormal   Collection Time    03/29/14 10:55 PM      Result Value Ref Range   Sodium 138  137 - 147 mEq/L   Potassium 5.6 (*) 3.7 - 5.3 mEq/L   Chloride 99  96 - 112 mEq/L   CO2 20  19 - 32 mEq/L   Glucose, Bld 94  70 - 99 mg/dL   BUN 48 (*) 6 - 23 mg/dL   Creatinine, Ser 7.21 (*) 0.50 - 1.35 mg/dL   Calcium 8.4  8.4 - 10.5 mg/dL   Total Protein 7.8  6.0 - 8.3 g/dL   Albumin 2.9 (*) 3.5 - 5.2 g/dL   AST 20  0 - 37 U/L   ALT 19  0 - 53 U/L   Alkaline Phosphatase 80  39 - 117 U/L   Total Bilirubin 0.5  0.3 -  1.2 mg/dL   GFR calc non Af Amer 9 (*) >90 mL/min   GFR calc Af Amer 10 (*) >90 mL/min   Comment: (NOTE)     The eGFR has been calculated using the CKD EPI equation.     This calculation has not been validated in all clinical situations.     eGFR's persistently <90 mL/min signify possible Chronic Kidney     Disease.  AMMONIA     Status: None   Collection Time    03/29/14 10:55 PM      Result Value Ref Range   Ammonia 16  11 - 60 umol/L  I-STAT ARTERIAL BLOOD GAS, ED     Status: Abnormal   Collection Time    03/29/14 11:07 PM      Result Value Ref Range   pH, Arterial 7.463 (*) 7.350 - 7.450   pCO2 arterial 28.4 (*) 35.0 - 45.0 mmHg   pO2, Arterial 69.0 (*) 80.0 - 100.0 mmHg   Bicarbonate 20.0  20.0 - 24.0 mEq/L   TCO2 21  0 - 100 mmol/L   O2 Saturation 93.0     Acid-base deficit 2.0  0.0 - 2.0 mmol/L   Patient temperature 101.9 F     Collection site RADIAL, ALLEN'S TEST ACCEPTABLE     Drawn by RT      Sample type ARTERIAL    I-STAT CG4 LACTIC ACID, ED     Status: Abnormal   Collection Time    03/29/14 11:14 PM      Result Value Ref Range   Lactic Acid, Venous 2.71 (*) 0.5 - 2.2 mmol/L  I-STAT ARTERIAL BLOOD GAS, ED     Status: Abnormal   Collection Time    03/30/14  1:22 AM      Result Value Ref Range   pH, Arterial 7.424  7.350 - 7.450   pCO2 arterial 33.9 (*) 35.0 - 45.0 mmHg   pO2, Arterial 102.0 (*) 80.0 - 100.0 mmHg   Bicarbonate 22.0  20.0 - 24.0 mEq/L   TCO2 23  0 - 100 mmol/L   O2 Saturation 98.0     Acid-base deficit 2.0  0.0 - 2.0 mmol/L   Patient temperature 100.2 F     Collection site RADIAL, ALLEN'S TEST ACCEPTABLE     Drawn by Operator     Sample type ARTERIAL     Ct Head Wo Contrast  03/29/2014   CLINICAL DATA:  Confusion  EXAM: CT HEAD WITHOUT CONTRAST  TECHNIQUE: Contiguous axial images were obtained from the base of the skull through the vertex without intravenous contrast.  COMPARISON:  None.  FINDINGS: The examination is significantly limited by patient motion artifact. Mild atrophic changes and chronic white matter ischemic changes are seen. There is a focus of parenchymal hemorrhage at the junction of the left temporal and left parietal lobes which measures 1.6 x 1.6 cm. No other focal abnormality is noted.  IMPRESSION: Significantly limited exam by patient motion artifact.  A focus of hemorrhage is noted as described above at the junction of the left temporal and parietal lobes. Mild surrounding edema is noted.  Critical Value/emergent results were called by telephone at the time of interpretation on 03/29/2014 at 11:48 PM to the emergency room physician, who verbally acknowledged these results.   Electronically Signed   By: Inez Catalina M.D.   On: 03/29/2014 23:48   Dg Chest Port 1 View  03/30/2014   CLINICAL DATA:  Status post intubation  EXAM: PORTABLE CHEST -  1 VIEW  COMPARISON:  03/29/2014  FINDINGS: Cardiac shadow remains enlarged. Endotracheal tube is now  noted 4.6 cm above the carina. Increasing infiltrative changes are noted bilaterally but particularly in the left mid lung when compared with the prior study. No acute bony abnormality is seen.  IMPRESSION: Endotracheal tube as described.  Increasing bilateral infiltrative changes.   Electronically Signed   By: Inez Catalina M.D.   On: 03/30/2014 00:21   Dg Chest Port 1 View  03/29/2014   CLINICAL DATA:  Loss of consciousness.  Dialysis patient.  EXAM: PORTABLE CHEST - 1 VIEW  COMPARISON:  DG CHEST 1V PORT dated 03/08/2014  FINDINGS: Cardiac enlargement without vascular congestion or edema. No focal consolidation or airspace disease. No blunting of costophrenic angles. No pneumothorax. No significant change since prior study.  IMPRESSION: Cardiac enlargement.  No evidence of active pulmonary disease.   Electronically Signed   By: Lucienne Capers M.D.   On: 03/29/2014 22:56     Assessment: 38 y.o. male with new onset isolated GTC symptomatic of left temporo-parietal hypertensive ICH. Intubated on the vent, on propofol. Recommend: 1) Nicardipine infusion, goal SBP<160. 2) Continue keppra 500 mg IV BID. 3) Follow up CT brain in 24 hours. 4) No antiplatelets. 5) ICU management as per critical care attending. 6) Stroke team to follow up in am.   Dorian Pod, MD [facility] [phone]  03/30/2014, 1:37 AM

## 2014-03-30 NOTE — Progress Notes (Signed)
Stroke Team Progress Note  HISTORY Matthew Beard is a 38 y.o. 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  Patient was home with his children when suddenly collapsed to the ground and had a generalized convulsion. EMS called and patient brought to the ED where He was confused with SBP 249.  Urgent CT brain revealed a focus of parenchymal hemorrhage at the junction of the left temporal and left parietal lobes which measures 1.6 x 1.6 cm.Mild surrounding edema but no mass effect or IV extension.  PTT 37. INR 1.30. Platelets 198  Patient was not protecting his airway and was intubated in the ED.  Loaded with 1 gram IV keppra. Nicardipine infusion started.   Date last known well: 03/29/14  Time last known well: unclear  tPA Given: no, ICH   SUBJECTIVE Brother and sister-in-law present. The patient is intubated. He withdraws to pain.  OBJECTIVE Most recent Vital Signs: Filed Vitals:   03/30/14 0615 03/30/14 0630 03/30/14 0645 03/30/14 0700  BP: 153/64 158/62 146/59 153/58  Pulse: 106     Temp:    99.1 F (37.3 C)  TempSrc:      Resp: 24 26 25 25   Height:      Weight:      SpO2:       CBG (last 3)   Recent Labs  03/29/14 2254 03/30/14 0203 03/30/14 0326  GLUCAP 101* 71 110*    IV Fluid Intake:   . niCARDipine    . propofol 50 mcg/kg/min (03/30/14 0524)    MEDICATIONS  . antiseptic oral rinse  15 mL Mouth Rinse QID  . carvedilol  25 mg Per Tube BID WC  . chlorhexidine  15 mL Mouth Rinse BID  . cloNIDine  0.2 mg Per Tube TID  . hydrALAZINE  100 mg Per Tube TID  . imipenem-cilastatin  250 mg Intravenous Q12H  . insulin aspart  0-15 Units Subcutaneous 6 times per day  . levETIRAcetam  500 mg Intravenous Q12H  . levETIRAcetam  1,000 mg Oral Once  . lidocaine (cardiac)  100 mg/645ml      . succinylcholine       PRN:  sodium chloride, fentaNYL  Diet:  NPO no liquids Activity:  Bedrest DVT Prophylaxis:  SCDs  CLINICALLY SIGNIFICANT STUDIES Basic Metabolic Panel:  Recent Labs Lab 03/29/14 2255 03/30/14 0637  NA 138 137  K 5.6* 5.4*  CL 99 101  CO2 20 19  GLUCOSE 94 93  BUN 48* 54*  CREATININE 7.21* 7.43*  CALCIUM 8.4 8.3*  MG  --  2.2  PHOS  --  5.6*   Liver Function Tests:  Recent Labs Lab 03/29/14 2255  AST 20  ALT 19  ALKPHOS 80  BILITOT 0.5  PROT 7.8  ALBUMIN 2.9*   CBC:  Recent Labs Lab 03/29/14 2255 03/30/14 0637  WBC 9.7 8.5  NEUTROABS 8.4*  --   HGB 8.4* 8.0*  HCT 25.9* 24.7*  MCV 91.5 90.5  PLT 198 212   Coagulation:  Recent Labs Lab 03/30/14 0052  LABPROT 15.9*  INR 1.30   Cardiac Enzymes:  Recent Labs Lab 03/30/14 0637  TROPONINI 0.45*   Urinalysis:  Recent Labs Lab 03/29/14 2251  COLORURINE YELLOW  LABSPEC 1.019  PHURINE 6.5  GLUCOSEU NEGATIVE  HGBUR LARGE*  BILIRUBINUR NEGATIVE  KETONESUR  NEGATIVE  PROTEINUR >300*  UROBILINOGEN 1.0  NITRITE NEGATIVE  LEUKOCYTESUR NEGATIVE   Lipid Panel    Component Value Date/Time   CHOL 187 01/22/2012 0515   TRIG 135 03/30/2014 0637   HDL 38* 01/22/2012 0515   CHOLHDL 4.9 01/22/2012 0515   VLDL 25 01/22/2012 0515   LDLCALC 124* 01/22/2012 0515   HgbA1C  Lab Results  Component Value Date   HGBA1C 5.3 03/08/2014    Urine Drug Screen:     Component Value Date/Time   LABOPIA NONE DETECTED 03/29/2014 2251   LABOPIA NEGATIVE 03/08/2014 2108   COCAINSCRNUR NONE DETECTED 03/29/2014 2251   COCAINSCRNUR NEGATIVE 03/08/2014 2108   LABBENZ NONE DETECTED 03/29/2014 2251   LABBENZ NEGATIVE 03/08/2014 2108   AMPHETMU NONE DETECTED 03/29/2014 2251   AMPHETMU NEGATIVE 03/08/2014 2108   THCU NONE DETECTED 03/29/2014 2251   LABBARB NONE DETECTED 03/29/2014 2251    Alcohol Level: No results found for this basename: ETH,  in the last 168 hours  Ct Head Wo Contrast 03/29/2014     Significantly limited exam by patient motion artifact.  A focus of hemorrhage is noted as described above at the junction of the left temporal and parietal lobes. Mild surrounding edema is noted.      Dg Chest Port 1 View 03/30/2014 Endotracheal tube as described.  Increasing bilateral infiltrative changes.   Electronically Signed   By: Alcide Clever M.D.   On: 03/30/2014 00:21   Dg Chest Port 1 View 03/29/2014    Cardiac enlargement.  No evidence of active pulmonary disease.    CT Angio of head 03/30/2014 1. No intracranial aneurysm identified. 2. Mild anterior and posterior circulation branches vessel irregularity, suggestive of mild intracranial atherosclerosis. No evidence of significant proximal stenosis. 3. Unchanged, acute left temporal lobe parenchymal hemorrhage.   MRI of the brain    MRA of the brain    2D Echocardiogram  pending  Carotid Doppler    CXR    EKG  Normal sinus rhythm rate 96 beats per minute.  For complete results please see formal report.   Therapy Recommendations pending  Physical Exam   Mental status: on propofol sedated. stuporose and barely arousable CN 2-12" pupils 3-4 mm bilaterally, reactive. No gaze preference. No nystagmus. Corneal reflex present    Tongue: midline.intubated.  Motor: Mild withdrawal in all 4 extremities  Right greater than left to noxious stimuli.  Sensory: mild reaction to pain but on propofol.  DTR's: unable to elicit, on propofol  Coordination and gait: unable to test.   ASSESSMENT Matthew Beard is a 38 y.o. male presenting with a witnessed seizure and collapse. TPA was not initiated secondary to hemorrhage. Head CT - a focus of hemorrhage is noted at the junction of the left temporal and parietal lobes.On no atherothrombotics prior to admission. Now on no atherothrombotics for secondary stroke prevention. Patient with resultant unresponsiveness. Stroke work up underway.   Hypertension  History of  noncompliance with medications  ESRD - HD  Nonischemic cardiomyopathy  History of lacunar infarcts  Anemia   Hospital day # 1  TREATMENT/PLAN  No antithrombotics at this time for secondary stroke prevention.  Await therapy evaluations  Await 2-D echo and carotid Dopplers  On Keppra   Keep systolic blood pressure less than 160  Check CT angiogram to evaluate for aneurysm/AVM given young age and parenchymal hematoma  D/w Dr Marchelle Gearing, and patient`s brother  Delton See PA-C [facility] Pager [phone] 03/30/2014, 8:06  AM This patient is critically ill and at significant risk of neurological worsening, death and care requires constant monitoring of vital signs, hemodynamics,respiratory and cardiac monitoring,review of multiple databases, neurological assessment, discussion with family, other specialists and medical decision making of high complexity. I spent 40 minutes of neurocritical care time  in the care of  this patient. I have personally obtained a history, examined the patient, evaluated imaging results, and formulated the assessment and plan of care. I agree with the above. Delia Heady, MD   To contact Stroke Continuity provider, please refer to [email or web address]. After hours, contact General Neurology

## 2014-03-30 NOTE — Progress Notes (Signed)
Patient intubated by ED Resident Physician with a size 7.5 ETT which was secured at 25@lip . Breath sounds were equal and bilateral, positive color change on ETCO2 detector, equal chest rise.  Chest xray at bedside.  Patient was placed on the ventilator on his 8CC per KG of IBW.  Vent Settings are VT 580, RR 14, Peep +5, FIO2 100%.  RT will draw ABG after one hour on ventilator.

## 2014-03-30 NOTE — H&P (Signed)
PULMONARY / CRITICAL CARE MEDICINE   Name: Matthew Beard MRN: [id number] DOB: 11-26-1975    ADMISSION DATE:  03/29/2014 CONSULTATION DATE:  03/30/14  REFERRING MD :  EDP PRIMARY SERVICE: PCCM  CHIEF COMPLAINT:  Cerebral hemorrhage  BRIEF PATIENT DESCRIPTION: 38 y.o. M on ESRD, non-compliant, presents after questionable syncopal episode and seizures.  Found to have small ICH.  Intubated by EDP for agitation, PCCM consulted for admission.  SIGNIFICANT EVENTS / STUDIES:  5/11 - fell backwards at home and began shaking, questionable loss of consciousness. 5/12 Head CT >>> small hemorrhage at junction of left temporal and parietal lobes.  Intubated by EDP for airway protection.  LINES / TUBES: OETT 5/12 >>> OGT 5/12 >>> Foley 5/12 >>>  CULTURES: Sputum 5/12 >>>  ANTIBIOTICS: Unasyn 5/12 >>>  HISTORY OF PRESENT ILLNESS:  Pt is sedated/altered; therefore, this HPI is obtained from chart review. Matthew Beard is a 38 y.o. M with PMH significant for ESRD (non-compliant with dialysis), HTN, anemia, NICM, CHF, SOB, who presented to ED on 5/11 after he was at home and per ED notes, fell backwards and began to shake.  His fiances children witnessed the event and called her.  He was in his USOH prior to this episode although fiance notes that his health has not been very good of late.   In ED, initial BP was 209/142 and got as high as 249/144.  Head CT was obtained and revealed a small focal hemorrhage at junction of left parietal and temporal lobes. Pt was intubated due to agitation/attempting to pull at lines/confusion/concern for lack of ability to protect airway. Per pt's fiance, he is non-compliant with his home meds as well as dialysis and she believes that he was supposed to have dialysis today, however, missed it. Of note, pt was recently discharged from [facility] on 4/28 after he was admitted and treated for volume overload and found to have ESRD.   PAST MEDICAL HISTORY :  Past Medical History   Diagnosis Date  . Hypertension   . CKD (chronic kidney disease), stage IV   . Normocytic anemia   . Pericardial effusion     Remote hx  . Cerebrovascular disease     Chronic lacunar infarcts  . NICM (nonischemic cardiomyopathy)   . CHF (congestive heart failure)   . Shortness of breath    Past Surgical History  Procedure Laterality Date  . Av fistula placement Left 10/19/2013    Procedure: ARTERIOVENOUS (AV) FISTULA CREATION- LEFT RADIOCEPHALIC;  Surgeon: Larina Earthly, MD;  Location: [facility] OR;  Service: Vascular;  Laterality: Left;   Prior to Admission medications   Medication Sig Start Date End Date Taking? Authorizing Provider  amLODipine (NORVASC) 10 MG tablet Take 1 tablet (10 mg total) by mouth daily. 03/16/14  Yes Leroy Sea, MD  carvedilol (COREG) 25 MG tablet Take 1 tablet (25 mg total) by mouth 2 (two) times daily with a meal. 03/16/14  Yes Leroy Sea, MD  cloNIDine (CATAPRES) 0.2 MG tablet Take 1 tablet (0.2 mg total) by mouth 3 (three) times daily. 03/16/14  Yes Leroy Sea, MD  furosemide (LASIX) 80 MG tablet Take 80 mg by mouth 2 (two) times daily.   Yes Historical Provider, MD  hydrALAZINE (APRESOLINE) 100 MG tablet Take 1 tablet (100 mg total) by mouth 3 (three) times daily. 03/16/14  Yes Leroy Sea, MD  isosorbide mononitrate (IMDUR) 30 MG 24 hr tablet Take 1 tablet (30 mg total) by mouth daily.  03/16/14  Yes Leroy Sea, MD  metolazone (ZAROXOLYN) 5 MG tablet Take 5 mg by mouth daily.   Yes Historical Provider, MD  potassium chloride (K-DUR) 10 MEQ tablet Take 10 mEq by mouth daily.   Yes Historical Provider, MD  sevelamer carbonate (RENVELA) 800 MG tablet Take 2 tablets (1,600 mg total) by mouth 3 (three) times daily with meals. 03/16/14  Yes Leroy Sea, MD   Allergies  Allergen Reactions  . Amoxicillin Hives    FAMILY HISTORY:  Family History  Problem Relation Age of Onset  . Hypertension Mother   . Stroke Mother   . Heart disease  Mother     Heart Disease before age 52  . Hypertension Father   . Stroke Father   . Heart disease Father     Heart Disease before age 26  . Hypertension Brother    SOCIAL HISTORY:  reports that he has never smoked. He has never used smokeless tobacco. He reports that he does not drink alcohol or use illicit drugs.  REVIEW OF SYSTEMS:  Unable to complete as pt is sedated on vent.  SUBJECTIVE: Remains hypertensive on propofol and cardene.  Intubated by EDP.  VITAL SIGNS: Temp:  [98.6 F (37 C)-101.9 F (38.8 C)] 99.7 F (37.6 C) (05/12 0025) Pulse Rate:  [126-227] 216 (05/12 0025) Resp:  [26-30] 27 (05/12 0025) BP: (209-249)/(142-171) 249/144 mmHg (05/12 0025) SpO2:  [94 %-100 %] 100 % (05/12 0025) FiO2 (%):  [100 %] 100 % (05/12 0005) Weight:  [81 kg (178 lb 9.2 oz)] 81 kg (178 lb 9.2 oz) (05/11 2209) HEMODYNAMICS:   VENTILATOR SETTINGS: Vent Mode:  [-] PRVC FiO2 (%):  [100 %] 100 % Set Rate:  [14 bmp] 14 bmp Vt Set:  [580 mL] 580 mL PEEP:  [5 cmH20] 5 cmH20 Plateau Pressure:  [31 cmH20] 31 cmH20 INTAKE / OUTPUT: Intake/Output   None     PHYSICAL EXAMINATION: General: Young AA male, sedated in stretcher, hypertensive. Neuro:  Intubated/sedated on propofol, RASS -3,puils 61mm BERTL  HEENT: /AT. PERRL, sclerae anicteric. Cardiovascular: tachy but regular, no M/R/G.  Lungs: Respirations even and unlabored on full vent support.  CTA bilaterally, No W/R/R.  Abdomen: BS x 4, soft, NT/ND.  Musculoskeletal: No gross deformities, 2+ edema to LE's bilaterally.  Fistula noted LUE. Skin: Intact, warm, no rashes.    LABS:  CBC  Recent Labs Lab 03/29/14 2255  WBC 9.7  HGB 8.4*  HCT 25.9*  PLT 198   Coag's No results found for this basename: APTT, INR,  in the last 168 hours BMET  Recent Labs Lab 03/29/14 2255  NA 138  K 5.6*  CL 99  CO2 20  BUN 48*  CREATININE 7.21*  GLUCOSE 94   Electrolytes  Recent Labs Lab 03/29/14 2255  CALCIUM 8.4   Sepsis  Markers  Recent Labs Lab 03/29/14 2314  LATICACIDVEN 2.71*   ABG  Recent Labs Lab 03/29/14 2307  PHART 7.463*  PCO2ART 28.4*  PO2ART 69.0*   Liver Enzymes  Recent Labs Lab 03/29/14 2255  AST 20  ALT 19  ALKPHOS 80  BILITOT 0.5  ALBUMIN 2.9*   Cardiac Enzymes No results found for this basename: TROPONINI, PROBNP,  in the last 168 hours Glucose  Recent Labs Lab 03/29/14 2254  GLUCAP 101*    Imaging Ct Head Wo Contrast  03/29/2014   CLINICAL DATA:  Confusion  EXAM: CT HEAD WITHOUT CONTRAST  TECHNIQUE: Contiguous axial images were obtained from the  base of the skull through the vertex without intravenous contrast.  COMPARISON:  None.  FINDINGS: The examination is significantly limited by patient motion artifact. Mild atrophic changes and chronic white matter ischemic changes are seen. There is a focus of parenchymal hemorrhage at the junction of the left temporal and left parietal lobes which measures 1.6 x 1.6 cm. No other focal abnormality is noted.  IMPRESSION: Significantly limited exam by patient motion artifact.  A focus of hemorrhage is noted as described above at the junction of the left temporal and parietal lobes. Mild surrounding edema is noted.  Critical Value/emergent results were called by telephone at the time of interpretation on 03/29/2014 at 11:48 PM to the emergency room physician, who verbally acknowledged these results.   Electronically Signed   By: Alcide Clever M.D.   On: 03/29/2014 23:48   Dg Chest Port 1 View  03/30/2014   CLINICAL DATA:  Status post intubation  EXAM: PORTABLE CHEST - 1 VIEW  COMPARISON:  03/29/2014  FINDINGS: Cardiac shadow remains enlarged. Endotracheal tube is now noted 4.6 cm above the carina. Increasing infiltrative changes are noted bilaterally but particularly in the left mid lung when compared with the prior study. No acute bony abnormality is seen.  IMPRESSION: Endotracheal tube as described.  Increasing bilateral infiltrative  changes.   Electronically Signed   By: Alcide Clever M.D.   On: 03/30/2014 00:21   Dg Chest Port 1 View  03/29/2014   CLINICAL DATA:  Loss of consciousness.  Dialysis patient.  EXAM: PORTABLE CHEST - 1 VIEW  COMPARISON:  DG CHEST 1V PORT dated 03/08/2014  FINDINGS: Cardiac enlargement without vascular congestion or edema. No focal consolidation or airspace disease. No blunting of costophrenic angles. No pneumothorax. No significant change since prior study.  IMPRESSION: Cardiac enlargement.  No evidence of active pulmonary disease.   Electronically Signed   By: Burman Nieves M.D.   On: 03/29/2014 22:56     ASSESSMENT / PLAN:  NEUROLOGIC A:  ICH - likely secondary to significantly elevated BP in the setting of hypertensive emergency. Questionable seizure. Urine tox negative. P:   - Neurosurgery and neurology consulted. - Keppra. - Propofol gtt / Fentanyl PRN for sedation. - WUA in AM.  CARDIOVASCULAR A:  Hypertensive Emergency - BP up to as high as 249/144 (MAP 164) Hx of non-ischemic cardiomyopathy. P:  - Gradual BP reduction,goal MAP ~ 120 for now (25% reduction) - Resume home carvedilol, clonidine, hydralazine. - Cardene gtt if needed  PULMONARY A: VDRF - in the setting of ICH. Concern for aspiration. P:   - Full vent support. - VAP bundle. - Empiric abx - Unasyn. - SBT in AM. - CXR in AM.  RENAL A:  ESRD Mild hyperkalemia. P:   - Nephrology consult. - BMP in AM.  GASTROINTESTINAL A:   NPO P:   - SUP: Pantoprazole. - TF if remains intubated > 24 hrs.  HEMATOLOGIC A:   No acute issues. P:  - VTE prophylaxis:  SCD's only. - CBC in AM.  INFECTIOUS A:   Concern for aspiration. P:   - Empiric Unasyn. - Monitor fever curve / WBC's.  ENDOCRINE A:   No acute issues. P:   - CBG's q4. - SSI.  Updated fiance at bedside  Rutherford Guys, PA - [facility] Pgr: [phone]  or [phone]  Summary - ICH -likely  hypertensive bleed in this non compliant man with ESRD with  questionable seizure & aspiration pneumonia  I have personally obtained a history, examined the patient, evaluated laboratory and imaging results, formulated the assessment and plan and placed orders. CRITICAL CARE: The patient is critically ill with multiple organ systems failure and requires high complexity decision making for assessment and support, frequent evaluation and titration of therapies, application of advanced monitoring technologies and extensive interpretation of multiple databases. Critical Care Time devoted to patient care services described in this note is 60 minutes.   Cyril Mourningakesh Alva MD. Tonny BollmanFCCP. [facility] Pager [phone] If no response call [phone]    03/30/2014, 12:56 AM

## 2014-03-30 NOTE — Progress Notes (Signed)
CRITICAL VALUE ALERT  Critical value received: trop 0 .45  Date of notification:  03/30/14  Time of notification:  0750  Critical value read back:yes  Nurse who received alert:  LBass  MD notified (1st page): Dr Marchelle Gearing  Time of first page: 0750  MD notified (2nd page):  Time of second page:  Responding MD: Marchelle Gearing  Time MD responded:  0800

## 2014-03-31 ENCOUNTER — Inpatient Hospital Stay (HOSPITAL_COMMUNITY): Payer: Medicaid Other

## 2014-03-31 DIAGNOSIS — Z9119 Patient's noncompliance with other medical treatment and regimen: Secondary | ICD-10-CM

## 2014-03-31 DIAGNOSIS — Z91199 Patient's noncompliance with other medical treatment and regimen due to unspecified reason: Secondary | ICD-10-CM

## 2014-03-31 DIAGNOSIS — J9600 Acute respiratory failure, unspecified whether with hypoxia or hypercapnia: Secondary | ICD-10-CM

## 2014-03-31 DIAGNOSIS — N189 Chronic kidney disease, unspecified: Secondary | ICD-10-CM

## 2014-03-31 DIAGNOSIS — I10 Essential (primary) hypertension: Secondary | ICD-10-CM

## 2014-03-31 DIAGNOSIS — Z9114 Patient's other noncompliance with medication regimen: Secondary | ICD-10-CM

## 2014-03-31 DIAGNOSIS — I619 Nontraumatic intracerebral hemorrhage, unspecified: Principal | ICD-10-CM

## 2014-03-31 DIAGNOSIS — Z91148 Patient's other noncompliance with medication regimen for other reason: Secondary | ICD-10-CM

## 2014-03-31 DIAGNOSIS — D631 Anemia in chronic kidney disease: Secondary | ICD-10-CM

## 2014-03-31 DIAGNOSIS — G9340 Encephalopathy, unspecified: Secondary | ICD-10-CM

## 2014-03-31 DIAGNOSIS — N039 Chronic nephritic syndrome with unspecified morphologic changes: Secondary | ICD-10-CM

## 2014-03-31 LAB — CBC WITH DIFFERENTIAL/PLATELET
BASOS ABS: 0 10*3/uL (ref 0.0–0.1)
Basophils Relative: 1 % (ref 0–1)
Eosinophils Absolute: 0.1 10*3/uL (ref 0.0–0.7)
Eosinophils Relative: 2 % (ref 0–5)
HCT: 25.2 % — ABNORMAL LOW (ref 39.0–52.0)
Hemoglobin: 8.1 g/dL — ABNORMAL LOW (ref 13.0–17.0)
LYMPHS ABS: 0.9 10*3/uL (ref 0.7–4.0)
Lymphocytes Relative: 14 % (ref 12–46)
MCH: 28.8 pg (ref 26.0–34.0)
MCHC: 32.1 g/dL (ref 30.0–36.0)
MCV: 89.7 fL (ref 78.0–100.0)
Monocytes Absolute: 0.6 10*3/uL (ref 0.1–1.0)
Monocytes Relative: 9 % (ref 3–12)
NEUTROS ABS: 4.9 10*3/uL (ref 1.7–7.7)
NEUTROS PCT: 74 % (ref 43–77)
PLATELETS: 221 10*3/uL (ref 150–400)
RBC: 2.81 MIL/uL — AB (ref 4.22–5.81)
RDW: 16.9 % — AB (ref 11.5–15.5)
WBC: 6.6 10*3/uL (ref 4.0–10.5)

## 2014-03-31 LAB — URINE CULTURE
COLONY COUNT: NO GROWTH
Culture: NO GROWTH

## 2014-03-31 LAB — BASIC METABOLIC PANEL
BUN: 66 mg/dL — ABNORMAL HIGH (ref 6–23)
CO2: 19 meq/L (ref 19–32)
Calcium: 8.1 mg/dL — ABNORMAL LOW (ref 8.4–10.5)
Chloride: 103 mEq/L (ref 96–112)
Creatinine, Ser: 8.49 mg/dL — ABNORMAL HIGH (ref 0.50–1.35)
GFR calc Af Amer: 8 mL/min — ABNORMAL LOW (ref >90)
GFR calc non Af Amer: 7 mL/min — ABNORMAL LOW (ref >90)
GLUCOSE: 70 mg/dL (ref 70–99)
POTASSIUM: 5.6 meq/L — AB (ref 3.7–5.3)
SODIUM: 143 meq/L (ref 137–147)

## 2014-03-31 LAB — GLUCOSE, CAPILLARY
GLUCOSE-CAPILLARY: 72 mg/dL (ref 70–99)
GLUCOSE-CAPILLARY: 79 mg/dL (ref 70–99)
GLUCOSE-CAPILLARY: 85 mg/dL (ref 70–99)
Glucose-Capillary: 82 mg/dL (ref 70–99)
Glucose-Capillary: 85 mg/dL (ref 70–99)
Glucose-Capillary: 86 mg/dL (ref 70–99)

## 2014-03-31 LAB — RENAL FUNCTION PANEL
ALBUMIN: 2.2 g/dL — AB (ref 3.5–5.2)
BUN: 63 mg/dL — ABNORMAL HIGH (ref 6–23)
CO2: 21 mEq/L (ref 19–32)
Calcium: 7.9 mg/dL — ABNORMAL LOW (ref 8.4–10.5)
Chloride: 102 mEq/L (ref 96–112)
Creatinine, Ser: 7.57 mg/dL — ABNORMAL HIGH (ref 0.50–1.35)
GFR calc non Af Amer: 8 mL/min — ABNORMAL LOW (ref >90)
GFR, EST AFRICAN AMERICAN: 9 mL/min — AB (ref >90)
Glucose, Bld: 92 mg/dL (ref 70–99)
PHOSPHORUS: 6.6 mg/dL — AB (ref 2.3–4.6)
Potassium: 4.4 mEq/L (ref 3.7–5.3)
SODIUM: 140 meq/L (ref 137–147)

## 2014-03-31 LAB — MAGNESIUM: MAGNESIUM: 2.3 mg/dL (ref 1.5–2.5)

## 2014-03-31 LAB — CK TOTAL AND CKMB (NOT AT ARMC)
CK TOTAL: 75 U/L (ref 7–232)
CK, MB: 2.1 ng/mL (ref 0.3–4.0)
Relative Index: INVALID (ref 0.0–2.5)

## 2014-03-31 LAB — LACTIC ACID, PLASMA: LACTIC ACID, VENOUS: 0.8 mmol/L (ref 0.5–2.2)

## 2014-03-31 LAB — PHOSPHORUS: Phosphorus: 6.5 mg/dL — ABNORMAL HIGH (ref 2.3–4.6)

## 2014-03-31 LAB — TROPONIN I
Troponin I: 0.71 ng/mL (ref <0.30)
Troponin I: 0.8 ng/mL (ref <0.30)

## 2014-03-31 MED ORDER — PENTAFLUOROPROP-TETRAFLUOROETH EX AERO: 1.0000 "application " | INHALATION_SPRAY | CUTANEOUS | Status: DC | PRN

## 2014-03-31 MED ORDER — ALTEPLASE 2 MG IJ SOLR: 2.0000 mg | Freq: Once | INTRAMUSCULAR | Status: AC | PRN

## 2014-03-31 MED ORDER — HEPARIN SODIUM (PORCINE) 1000 UNIT/ML DIALYSIS: 1000.0000 [IU] | INTRAMUSCULAR | Status: DC | PRN

## 2014-03-31 MED ORDER — SODIUM CHLORIDE 0.9 % IV SOLN: 100.0000 mL | INTRAVENOUS | Status: DC | PRN

## 2014-03-31 MED ORDER — HYDRALAZINE HCL 50 MG PO TABS
50.0000 mg | ORAL_TABLET | Freq: Three times a day (TID) | ORAL | Status: DC
  Administered 2014-03-31 – 2014-04-02 (×6): 50 mg
  Filled 2014-03-31 (×8): qty 1

## 2014-03-31 MED ORDER — PRISMASOL BGK 4/2.5 32-4-2.5 MEQ/L IV SOLN
INTRAVENOUS | Status: DC
  Administered 2014-03-31 – 2014-04-05 (×5): via INTRAVENOUS_CENTRAL
  Filled 2014-03-31 (×9): qty 5000

## 2014-03-31 MED ORDER — LIDOCAINE-PRILOCAINE 2.5-2.5 % EX CREA
1.0000 "application " | TOPICAL_CREAM | CUTANEOUS | Status: DC | PRN
  Filled 2014-03-31: qty 5

## 2014-03-31 MED ORDER — PRISMASOL BGK 4/2.5 32-4-2.5 MEQ/L IV SOLN
INTRAVENOUS | Status: DC
  Administered 2014-03-31 – 2014-04-05 (×11): via INTRAVENOUS_CENTRAL
  Filled 2014-03-31 (×13): qty 5000

## 2014-03-31 MED ORDER — SODIUM CHLORIDE 0.9 % FOR CRRT
INTRAVENOUS_CENTRAL | Status: DC | PRN
  Filled 2014-03-31: qty 1000

## 2014-03-31 MED ORDER — NEPRO/CARBSTEADY PO LIQD
237.0000 mL | ORAL | Status: DC | PRN
  Filled 2014-03-31: qty 237

## 2014-03-31 MED ORDER — LEVOFLOXACIN IN D5W 250 MG/50ML IV SOLN
250.0000 mg | INTRAVENOUS | Status: DC
  Administered 2014-03-31 – 2014-04-04 (×5): 250 mg via INTRAVENOUS
  Filled 2014-03-31 (×6): qty 50

## 2014-03-31 MED ORDER — LIDOCAINE HCL (PF) 1 % IJ SOLN: 5.0000 mL | INTRAMUSCULAR | Status: DC | PRN

## 2014-03-31 MED ORDER — PRISMASOL BGK 4/2.5 32-4-2.5 MEQ/L IV SOLN
INTRAVENOUS | Status: DC
  Administered 2014-03-31 – 2014-04-05 (×39): via INTRAVENOUS_CENTRAL
  Filled 2014-03-31 (×58): qty 5000

## 2014-03-31 NOTE — Progress Notes (Signed)
[facility] ROUNDING NOTE   Subjective:   Interval History: somnolent  Waking from sedation  Objective:  Vital signs in last 24 hours:  Temp:  [86.7 F (30.4 C)-100.9 F (38.3 C)] 98.8 F (37.1 C) (05/13 0852) Pulse Rate:  [66-101] 69 (05/13 0835) Resp:  [0-28] 26 (05/13 0835) BP: (104-184)/(47-93) 136/91 mmHg (05/13 0835) SpO2:  [96 %-100 %] 98 % (05/13 0835) FiO2 (%):  [40 %-60 %] 40 % (05/13 0835) Weight:  [85.7 kg (188 lb 15 oz)] 85.7 kg (188 lb 15 oz) (05/13 0500)  Weight change: 4.7 kg (10 lb 5.8 oz) Filed Weights   03/29/14 2209 03/30/14 0444 03/31/14 0500  Weight: 81 kg (178 lb 9.2 oz) 82.5 kg (181 lb 14.1 oz) 85.7 kg (188 lb 15 oz)    Intake/Output: I/O last 3 completed shifts: In: 2388.1 [I.V.:1773.1; IV Piggyback:615] Out: 1598 [Urine:583; Emesis/NG output:1015]   Intake/Output this shift:     CVS- RRR RS- CTA ABD- BS present soft non-distended EXT- no edema   Basic Metabolic Panel:  Recent Labs Lab 03/29/14 2255 03/30/14 0637 03/31/14 0430  NA 138 137 143  K 5.6* 5.4* 5.6*  CL 99 101 103  CO2 20 19 19   GLUCOSE 94 93 70  BUN 48* 54* 66*  CREATININE 7.21* 7.43* 8.49*  CALCIUM 8.4 8.3* 8.1*  MG  --  2.2 2.3  PHOS  --  5.6* 6.5*    Liver Function Tests:  Recent Labs Lab 03/29/14 2255  AST 20  ALT 19  ALKPHOS 80  BILITOT 0.5  PROT 7.8  ALBUMIN 2.9*   No results found for this basename: LIPASE, AMYLASE,  in the last 168 hours  Recent Labs Lab 03/29/14 2255  AMMONIA 16    CBC:  Recent Labs Lab 03/29/14 2255 03/30/14 0637 03/31/14 0430  WBC 9.7 8.5 6.6  NEUTROABS 8.4*  --  4.9  HGB 8.4* 8.0* 8.1*  HCT 25.9* 24.7* 25.2*  MCV 91.5 90.5 89.7  PLT 198 212 221    Cardiac Enzymes:  Recent Labs Lab 03/30/14 0637 03/30/14 1035 03/30/14 1555 03/30/14 2330 03/31/14 0430  CKTOTAL  --   --   --   --  75  CKMB  --   --   --   --  2.1  TROPONINI 0.45* 0.56* 0.51* 0.80* 0.71*    BNP: No components found  with this basename: POCBNP,   CBG:  Recent Labs Lab 03/30/14 1525 03/30/14 1920 03/31/14 0027 03/31/14 0355 03/31/14 0838  GLUCAP 73 76 85 79 72    Microbiology: Results for orders placed during the hospital encounter of 03/29/14  URINE CULTURE     Status: None   Collection Time    03/29/14 10:51 PM      Result Value Ref Range Status   Specimen Description URINE, CATHETERIZED   Final   Special Requests NONE   Final   Culture  Setup Time     Final   Value: 03/29/2014 23:32     [facility]formed at [facility]   Colony Count     Final   Value: NO GROWTH     [facility]formed [facility]   Culture     Final   Value: NO GROWTH     [facility]formed at [facility]   Report Status 03/31/2014 FINAL   Final  CULTURE, BLOOD (ROUTINE X 2)     Status: None   Collection Time    03/29/14 10:55 PM  Result Value Ref Range Status   Specimen Description BLOOD ARM RIGHT   Final   Special Requests BOTTLES DRAWN AEROBIC AND ANAEROBIC 5CC EA   Final   Culture  Setup Time     Final   Value: 03/30/2014 04:10     Performed at [facility]   Culture     Final   Value:        BLOOD CULTURE RECEIVED NO GROWTH TO DATE CULTURE WILL BE HELD FOR 5 DAYS BEFORE ISSUING A FINAL NEGATIVE REPORT     Performed at [facility]   Report Status PENDING   Incomplete  CULTURE, BLOOD (ROUTINE X 2)     Status: None   Collection Time    03/29/14 11:03 PM      Result Value Ref Range Status   Specimen Description BLOOD HAND LEFT   Final   Special Requests BOTTLES DRAWN AEROBIC ONLY 6CC   Final   Culture  Setup Time     Final   Value: 03/30/2014 04:10     Performed at [facility]   Culture     Final   Value:        BLOOD CULTURE RECEIVED NO GROWTH TO DATE CULTURE WILL BE HELD FOR 5 DAYS BEFORE ISSUING A FINAL NEGATIVE REPORT     Performed at [facility]   Report Status PENDING   Incomplete  MRSA PCR SCREENING     Status: None   Collection Time    03/30/14  4:20  AM      Result Value Ref Range Status   MRSA by PCR NEGATIVE  NEGATIVE Final   Comment:            The GeneXpert MRSA Assay (FDA     approved for NASAL specimens     only), is one component of a     comprehensive MRSA colonization     surveillance program. It is not     intended to diagnose MRSA     infection nor to guide or     monitor treatment for     MRSA infections.  CULTURE, RESPIRATORY (NON-EXPECTORATED)     Status: None   Collection Time    03/30/14  4:45 AM      Result Value Ref Range Status   Specimen Description TRACHEAL ASPIRATE   Final   Special Requests NONE   Final   Gram Stain     Final   Value: MODERATE WBC PRESENT,BOTH PMN AND MONONUCLEAR     NO SQUAMOUS EPITHELIAL CELLS SEEN     ABUNDANT GRAM POSITIVE COCCI     IN PAIRS     Performed at [facility]   Culture     Final   Value: Non-Pathogenic Oropharyngeal-type Flora Isolated.     Performed at [facility]   Report Status PENDING   Incomplete    Coagulation Studies:  Recent Labs  03/30/14 0052  LABPROT 15.9*  INR 1.30    Urinalysis:  Recent Labs  03/29/14 2251  COLORURINE YELLOW  LABSPEC 1.019  PHURINE 6.5  GLUCOSEU NEGATIVE  HGBUR LARGE*  BILIRUBINUR NEGATIVE  KETONESUR NEGATIVE  PROTEINUR >300*  UROBILINOGEN 1.0  NITRITE NEGATIVE  LEUKOCYTESUR NEGATIVE      Imaging: Ct Angio Head W/cm &/or Wo Cm  03/30/2014   CLINICAL DATA:  Intracranial hemorrhage.  Rule out aneurysm.  EXAM: CT ANGIOGRAPHY HEAD  TECHNIQUE: Multidetector CT imaging of the head was performed using the  standard protocol during bolus administration of intravenous contrast. Multiplanar CT image reconstructions and MIPs were obtained to evaluate the vascular anatomy.  CONTRAST:  50mL OMNIPAQUE IOHEXOL 350 MG/ML SOLN  COMPARISON:  Head CT 03/29/2014  FINDINGS: 2.0 x 1.6 cm acute parenchymal hemorrhage in the posterior left temporal lobe with mild surrounding edema does not appear significantly changed. There  is no evidence of new intracranial hemorrhage. Patchy hypoattenuation in the pons is unchanged from 06/29/2013, compatible with chronic ischemic change. Old lacunar infarcts are noted in the thalami. Moderate periventricular white matter chronic small vessel ischemic disease is unchanged. Remote infarct is also noted adjacent to the left frontal MRI. No abnormal enhancement is identified. Ventricles are mildly prominent for age, unchanged. There is no midline shift or extra-axial fluid collection. Orbits are unremarkable. Mild right frontal sinus mucosal thickening is noted. There is a small amount of right maxillary sinus fluid.  The visualized distal vertebral arteries are patent with the left being dominant. PICA origins are patent. AICA and SCA origins are patent. Basilar artery is patent without stenosis. There is a fetal origin of the left PCA with a hypoplastic left P1 segment. Patent right posterior communicating artery is not identified. There is mild bilateral PCA branch vessel irregularity without significant proximal stenosis.  Internal carotid arteries are patent from skull base to carotid termini. There is mild ACA and MCA branch vessel irregularity bilaterally without evidence of significant proximal stenosis. No intracranial aneurysm is identified.  Review of the MIP images confirms the above findings.  IMPRESSION: 1. No intracranial aneurysm identified. 2. Mild anterior and posterior circulation branches vessel irregularity, suggestive of mild intracranial atherosclerosis. No evidence of significant proximal stenosis. 3. Unchanged, acute left temporal lobe parenchymal hemorrhage.   Electronically Signed   By: Sebastian Ache   On: 03/30/2014 11:48   Ct Head Wo Contrast  03/29/2014   CLINICAL DATA:  Confusion  EXAM: CT HEAD WITHOUT CONTRAST  TECHNIQUE: Contiguous axial images were obtained from the base of the skull through the vertex without intravenous contrast.  COMPARISON:  None.  FINDINGS: The  examination is significantly limited by patient motion artifact. Mild atrophic changes and chronic white matter ischemic changes are seen. There is a focus of parenchymal hemorrhage at the junction of the left temporal and left parietal lobes which measures 1.6 x 1.6 cm. No other focal abnormality is noted.  IMPRESSION: Significantly limited exam by patient motion artifact.  A focus of hemorrhage is noted as described above at the junction of the left temporal and parietal lobes. Mild surrounding edema is noted.  Critical Value/emergent results were called by telephone at the time of interpretation on 03/29/2014 at 11:48 PM to the emergency room physician, who verbally acknowledged these results.   Electronically Signed   By: Alcide Clever M.D.   On: 03/29/2014 23:48   Dg Chest Port 1 View  03/31/2014   CLINICAL DATA:  Evaluate endotracheal tube position.  EXAM: PORTABLE CHEST - 1 VIEW  COMPARISON:  DG CHEST 1V PORT dated 03/30/2014; DG CHEST 1V PORT dated 03/30/2014; DG CHEST 1V PORT dated 03/29/2014; DG CHEST 1V PORT dated 03/08/2014  FINDINGS: Cardiopericardial silhouette is enlarged. Endotracheal tube tip is 48 mm from the carina. Right IJ vas cath present, unchanged. Enteric tube is present with the tip not visualized. Diffuse bilateral basilar predominant airspace disease, most compatible with pulmonary edema. No large pleural effusions are identified. Monitoring leads project over the chest.  IMPRESSION: 1. Support apparatus in good position, detailed  above. 2. Slight worsening pulmonary aeration with lower volumes and more prominent basilar opacity, most consistent with pulmonary edema based on symmetry.   Electronically Signed   By: Andreas Newport M.D.   On: 03/31/2014 07:57   Dg Chest Port 1 View  03/30/2014   CLINICAL DATA:  Placement of endotracheal tube, central line, and NG tube  EXAM: PORTABLE CHEST - 1 VIEW  COMPARISON:  DG CHEST 1V PORT dated 03/30/2014; DG CHEST 1V PORT dated 03/29/2014; DG CHEST 1V  PORT dated 03/08/2014  FINDINGS: Endotracheal tube is identified with tip 3.5 cm above the carinal. There is a right internal jugular central line with tip projecting over the right mainstem bronchus. An NG tube is identified crossing the gastroesophageal junction, with its tip not identified.  Mild cardiac enlargement. Hazy right lower lobe and left lower lobe opacities.  IMPRESSION: 1. Stable right lower lung zone and left lower lung zone infiltrate with improved aeration in the left upper lobe when compared to prior study. 2. Lines and tubes as described. Position of right internal jugular central line tip indeterminate. It projects over an area that could be associated with the superior vena cava. Distinction between potential arterial or venous location cannot be made on this single projection radiograph. Correlate clinically.   Electronically Signed   By: Esperanza Heir M.D.   On: 03/30/2014 19:40   Dg Chest Port 1 View  03/30/2014   CLINICAL DATA:  Status post intubation  EXAM: PORTABLE CHEST - 1 VIEW  COMPARISON:  03/29/2014  FINDINGS: Cardiac shadow remains enlarged. Endotracheal tube is now noted 4.6 cm above the carina. Increasing infiltrative changes are noted bilaterally but particularly in the left mid lung when compared with the prior study. No acute bony abnormality is seen.  IMPRESSION: Endotracheal tube as described.  Increasing bilateral infiltrative changes.   Electronically Signed   By: Alcide Clever M.D.   On: 03/30/2014 00:21   Dg Chest Port 1 View  03/29/2014   CLINICAL DATA:  Loss of consciousness.  Dialysis patient.  EXAM: PORTABLE CHEST - 1 VIEW  COMPARISON:  DG CHEST 1V PORT dated 03/08/2014  FINDINGS: Cardiac enlargement without vascular congestion or edema. No focal consolidation or airspace disease. No blunting of costophrenic angles. No pneumothorax. No significant change since prior study.  IMPRESSION: Cardiac enlargement.  No evidence of active pulmonary disease.    Electronically Signed   By: Burman Nieves M.D.   On: 03/29/2014 22:56     Medications:   . niCARDipine Stopped (03/30/14 1900)  . dialysis replacement fluid (prismasate)    . dialysis replacement fluid (prismasate)    . dialysate (PRISMASATE)    . propofol 10 mcg/kg/min (03/31/14 0847)   . antiseptic oral rinse  15 mL Mouth Rinse QID  . carvedilol  25 mg Per Tube BID WC  . chlorhexidine  15 mL Mouth Rinse BID  . cloNIDine  0.2 mg Per Tube TID  . darbepoetin (ARANESP) injection - DIALYSIS  100 mcg Intravenous Q Wed-HD  . doxercalciferol  4 mcg Intravenous Q M,W,F-HD  . ferric gluconate (FERRLECIT/NULECIT) IV  125 mg Intravenous Q M,W,F-HD  . hydrALAZINE  100 mg Per Tube TID  . imipenem-cilastatin  250 mg Intravenous Q12H  . insulin aspart  0-15 Units Subcutaneous 6 times per day  . levETIRAcetam  500 mg Intravenous Q12H  . pantoprazole sodium  40 mg Per Tube Daily   sodium chloride, fentaNYL, heparin, sodium chloride  Assessment/ Plan:  focus of parenchymal  hemorrhage at the junction of the left temporal and left parietal lobes which measures 1.6 x 1.6 cm.Mild surrounding edema but no mass effect or IV extension.    1. ESRD (non-compliant with dialysis) will place patient on CVVHDF  2. Electrolytes  Hyperkalemia mild noted  3. Metabolic acidosis mild  4. Anemia  Will follow consider transfusion if less than 8  5. Bone mineral  Will assess calcium and phosphorus  6. HTN  Improved control  Plan CRRT for several days prior to transitioning to intermittent dialysis    LOS: 2 [name] @TODAY @9 :10 AM

## 2014-03-31 NOTE — Consult Note (Signed)
Reason for Consult: Cardiomyopathy  Requesting Physician: Dr Leonie Man Primary Cardiologist: Meda Coffee  HPI: This is a 38 y.o. male seen by Dr Percival Spanish initially in 2009 and most recently by Dr. Ena Dawley 02/26/14 with a past medical history significant for chronic medical non compliance, uncontrolled HTN, previous chronic renal insuffiency- now on HD, admitted through the ER 03/29/14 with seizures and respiratory failure. He was found to have [place]. His B/P on admission was 237/151. He apparently had been non compliant with HD and medications per his fiance. An echo done 03/30/14 showed an EF of 25-30%. Previous echo reports indicate wide range of EF readings. He has global HK. I could find no prior ischemic work up.   PMHx:  Past Medical History  Diagnosis Date  . Hypertension   . CKD (chronic kidney disease), stage IV   . Normocytic anemia   . Pericardial effusion     Remote hx  . Cerebrovascular disease     Chronic lacunar infarcts  . NICM (nonischemic cardiomyopathy)   . CHF (congestive heart failure)   . Shortness of breath    Past Surgical History  Procedure Laterality Date  . Av fistula placement Left 10/19/2013    Procedure: ARTERIOVENOUS (AV) FISTULA CREATION- LEFT RADIOCEPHALIC;  Surgeon: Rosetta Posner, MD;  Location: [facility] OR;  Service: Vascular;  Laterality: Left;    FAMHx: HTN, stroke   SOCHx:  reports that he has never smoked. He has never used smokeless tobacco. He reports that he does not drink alcohol or use illicit drugs.  ALLERGIES: Allergies  Allergen Reactions  . Amoxicillin Hives    ROS: Review of systems not obtained due to patient factors. Pt intubated  HOME MEDICATIONS: Prescriptions prior to admission  Medication Sig Dispense Refill  . amLODipine (NORVASC) 10 MG tablet Take 1 tablet (10 mg total) by mouth daily.  30 tablet  0  . carvedilol (COREG) 25 MG tablet Take 1 tablet (25 mg total) by mouth 2 (two) times daily with a meal.  60 tablet  0  .  cloNIDine (CATAPRES) 0.2 MG tablet Take 1 tablet (0.2 mg total) by mouth 3 (three) times daily.  90 tablet  0  . furosemide (LASIX) 80 MG tablet Take 80 mg by mouth 2 (two) times daily.      . hydrALAZINE (APRESOLINE) 100 MG tablet Take 1 tablet (100 mg total) by mouth 3 (three) times daily.  90 tablet  0  . isosorbide mononitrate (IMDUR) 30 MG 24 hr tablet Take 1 tablet (30 mg total) by mouth daily.  30 tablet  0  . metolazone (ZAROXOLYN) 5 MG tablet Take 5 mg by mouth daily.      . potassium chloride (K-DUR) 10 MEQ tablet Take 10 mEq by mouth daily.      . sevelamer carbonate (RENVELA) 800 MG tablet Take 2 tablets (1,600 mg total) by mouth 3 (three) times daily with meals.  90 tablet  0    HOSPITAL MEDICATIONS: I have reviewed the patient's current medications.  VITALS: Blood pressure 117/74, pulse 68, temperature 98.3 F (36.8 C), temperature source Oral, resp. rate 24, height _0  (1.778 m), weight 188 lb 15 oz (85.7 kg), SpO2 100.00%.  PHYSICAL EXAM: General appearance: intubated Neck: no adenopathy and no carotid bruit Lungs: decrease Heart: regular rate and rhythm Abdomen: mild distension Extremities: no edema Pulses: diminnished Skin: cool and dry Neurologic: Intubated, not responsive but spontaneous lt arm movement noted  LABS: URINE RAPID DRUG SCREEN (HOSP PERFORMED)  Status: None   Collection Time    03/29/14 10:51 PM      Result Value Ref Range   Opiates NONE DETECTED  NONE DETECTED   Cocaine NONE DETECTED  NONE DETECTED   Benzodiazepines NONE DETECTED  NONE DETECTED   Amphetamines NONE DETECTED  NONE DETECTED   Tetrahydrocannabinol NONE DETECTED  NONE DETECTED   Barbiturates NONE DETECTED  NONE DETECTED   Comment:                                                                 Opiates          300     Cocaine          300      URINE MICROSCOPIC-ADD ON     Status: Abnormal            Result                                  CBG  MONITORING, ED     Status: Abnormal   Collection Time    03/29/14 10:54 PM      Result Value Ref Range   Glucose-Capillary 101 (*) 70 - 99 mg/dL  CBC WITH DIFFERENTIAL     Status: Abnormal   Collection Time    03/29/14 10:55 PM      Result Value Ref Range   WBC 9.7  4.0 - 10.5 K/uL   RBC 2.83 (*) 4.22 - 5.81 MIL/uL   Hemoglobin 8.4 (*) 13.0 - 17.0 g/dL   HCT 25.9 (*) 39.0 - 52.0 %   MCV 91.5  78.0 - 100.0 fL   MCH 29.7  26.0 - 34.0 pg   MCHC 32.4  30.0 - 36.0 g/dL   RDW 17.2 (*) 11.5 - 15.5 %   Platelets 198  150 - 400 K/uL   Neutrophils Relative % 86 (*) 43 - 77 %   Neutro Abs 8.4 (*) 1.7 - 7.7 K/uL   Lymphocytes Relative 6 (*) 12 - 46 %   Lymphs Abs 0.5 (*) 0.7 - 4.0 K/uL   Monocytes Relative 7  3 - 12 %   Monocytes Absolute 0.7  0.1 - 1.0 K/uL   Eosinophils Relative 0  0 - 5 %   Eosinophils Absolute 0.0  0.0 - 0.7 K/uL   Basophils Relative 1  0 - 1 %   Basophils Absolute 0.1  0.0 - 0.1 K/uL  COMPREHENSIVE METABOLIC PANEL     Status: Abnormal   Collection Time    03/29/14 10:55 PM      Result Value Ref Range   Sodium 138  137 - 147 mEq/L   Potassium 5.6 (*) 3.7 - 5.3 mEq/L   Chloride 99  96 - 112 mEq/L   CO2 20  19 - 32 mEq/L   Glucose, Bld 94  70 - 99 mg/dL   BUN 48 (*) 6 - 23 mg/dL   Creatinine, Ser 7.21 (*) 0.50 - 1.35 mg/dL   Calcium 8.4  8.4 - 10.5 mg/dL   Total Protein 7.8  6.0 - 8.3 g/dL   Albumin 2.9 (*) 3.5 - 5.2 g/dL   AST 20  0 - 37  U/L   ALT 19  0 - 53 U/L   Alkaline Phosphatase 80  39 - 117 U/L   Total Bilirubin 0.5  0.3 - 1.2 mg/dL   GFR calc non Af Amer 9 (*) >90 mL/min   GFR calc Af Amer 10 (*) >90 mL/min   Comment: (NOTE)     The eGFR has been calculated using the CKD EPI equation.     This calculation has not been validated in all clinical situations.     eGFR's persistently <90 mL/min signify possible Chronic Kidney     Disease.  CULTURE, BLOOD (ROUTINE X 2)     Status: None   Collection Time    03/29/14 10:55 PM      Result Value Ref Range    Specimen Description BLOOD ARM RIGHT     Special Requests BOTTLES DRAWN AEROBIC AND ANAEROBIC 5CC EA     Culture  Setup Time       Value: 03/30/2014 04:10     Performed at [facility]   Culture       Value:        BLOOD CULTURE RECEIVED NO GROWTH TO DATE CULTURE WILL BE HELD FOR 5 DAYS BEFORE ISSUING A FINAL NEGATIVE REPORT     Performed at [facility]   Report Status PENDING    AMMONIA     Status: None   Collection Time    03/29/14 10:55 PM      Result Value Ref Range   Ammonia 16  11 - 60 umol/L  CULTURE, BLOOD (ROUTINE X 2)     Status: None   Collection Time    03/29/14 11:03 PM      Result Value Ref Range   Specimen Description BLOOD HAND LEFT     Special Requests BOTTLES DRAWN AEROBIC ONLY 6CC     Culture  Setup Time       Value: 03/30/2014 04:10     Performed at [facility]   Culture       Value:        BLOOD CULTURE RECEIVED NO GROWTH TO DATE CULTURE WILL BE HELD FOR 5 DAYS BEFORE ISSUING A FINAL NEGATIVE REPORT     Performed at [facility]   Report Status PENDING    I-STAT ARTERIAL BLOOD GAS, ED     Status: Abnormal   Collection Time    03/29/14 11:07 PM      Result Value Ref Range   pH, Arterial 7.463 (*) 7.350 - 7.450   pCO2 arterial 28.4 (*) 35.0 - 45.0 mmHg   pO2, Arterial 69.0 (*) 80.0 - 100.0 mmHg   Bicarbonate 20.0  20.0 - 24.0 mEq/L   TCO2 21  0 - 100 mmol/L   O2 Saturation 93.0     Acid-base deficit 2.0  0.0 - 2.0 mmol/L   Patient temperature 101.9 F     Collection site RADIAL, ALLEN'S TEST ACCEPTABLE     Drawn by RT     Sample type ARTERIAL    I-STAT CG4 LACTIC ACID, ED     Status: Abnormal   Collection Time    03/29/14 11:14 PM      Result Value Ref Range   Lactic Acid, Venous 2.71 (*) 0.5 - 2.2 mmol/L  APTT     Status: None   Collection Time    03/30/14 12:52 AM      Result Value Ref Range   aPTT 37  24 - 37 seconds  Comment:            IF BASELINE aPTT IS ELEVATED,     SUGGEST PATIENT RISK ASSESSMENT      BE USED TO DETERMINE APPROPRIATE     ANTICOAGULANT THERAPY.  PROTIME-INR     Status: Abnormal   Collection Time    03/30/14 12:52 AM      Result Value Ref Range   Prothrombin Time 15.9 (*) 11.6 - 15.2 seconds   INR 1.30  0.00 - 1.49  I-STAT ARTERIAL BLOOD GAS, ED     Status: Abnormal   Collection Time    03/30/14  1:22 AM      Result Value Ref Range   pH, Arterial 7.424  7.350 - 7.450   pCO2 arterial 33.9 (*) 35.0 - 45.0 mmHg   pO2, Arterial 102.0 (*) 80.0 - 100.0 mmHg   Bicarbonate 22.0  20.0 - 24.0 mEq/L   TCO2 23  0 - 100 mmol/L   O2 Saturation 98.0     Acid-base deficit 2.0  0.0 - 2.0 mmol/L   Patient temperature 100.2 F     Collection site RADIAL, ALLEN'S TEST ACCEPTABLE     Drawn by Operator     Sample type ARTERIAL    CBG MONITORING, ED     Status: None   Collection Time    03/30/14  2:03 AM      Result Value Ref Range   Glucose-Capillary 71  70 - 99 mg/dL  TRIGLYCERIDES     Status: None   Collection Time    03/30/14  2:05 AM      Result Value Ref Range   Triglycerides 130  <150 mg/dL  GLUCOSE, CAPILLARY     Status: Abnormal   Collection Time    03/30/14  3:26 AM      Result Value Ref Range   Glucose-Capillary 110 (*) 70 - 99 mg/dL  MRSA PCR SCREENING     Status: None   Collection Time    03/30/14  4:20 AM      Result Value Ref Range   MRSA by PCR NEGATIVE  NEGATIVE   Comment:            The GeneXpert MRSA Assay (FDA     approved for NASAL specimens     only), is one component of a     comprehensive MRSA colonization     surveillance program. It is not     intended to diagnose MRSA     infection nor to guide or     monitor treatment for     MRSA infections.  BLOOD GAS, ARTERIAL     Status: Abnormal   Collection Time    03/30/14  4:22 AM      Result Value Ref Range   FIO2 1.00     O2 Content 1.0     Delivery systems VENTILATOR     Mode PRESSURE REGULATED VOLUME CONTROL     VT 580     Rate 14     Peep/cpap 5.0     pH, Arterial 7.431  7.350 -  7.450   pCO2 arterial 31.4 (*) 35.0 - 45.0 mmHg   pO2, Arterial 46.6 (*) 80.0 - 100.0 mmHg   Bicarbonate 20.5  20.0 - 24.0 mEq/L   TCO2 21.5  0 - 100 mmol/L   Acid-base deficit 3.1 (*) 0.0 - 2.0 mmol/L   O2 Saturation 77.2     Patient temperature 98.6     Collection site  RIGHT RADIAL     Drawn by [id number]     Sample type ARTERIAL DRAW     Allens test (pass/fail) PASS  PASS  CULTURE, RESPIRATORY (NON-EXPECTORATED)     Status: None   Collection Time    03/30/14  4:45 AM      Result Value Ref Range   Specimen Description TRACHEAL ASPIRATE     Special Requests NONE     Gram Stain       Value: MODERATE WBC PRESENT,BOTH PMN AND MONONUCLEAR     NO SQUAMOUS EPITHELIAL CELLS SEEN     ABUNDANT GRAM POSITIVE COCCI     IN PAIRS     Performed at [facility]   Culture       Value: Non-Pathogenic Oropharyngeal-type Flora Isolated.     Performed at [facility]   Report Status PENDING    BLOOD GAS, ARTERIAL     Status: Abnormal   Collection Time    03/30/14  5:59 AM      Result Value Ref Range   FIO2 1.00     Delivery systems VENTILATOR     Mode PRESSURE REGULATED VOLUME CONTROL     VT 580     Rate 14     Peep/cpap 10.0     pH, Arterial 7.429  7.350 - 7.450   pCO2 arterial 31.8 (*) 35.0 - 45.0 mmHg   pO2, Arterial 329.0 (*) 80.0 - 100.0 mmHg   Bicarbonate 20.7  20.0 - 24.0 mEq/L   TCO2 21.7  0 - 100 mmol/L   Acid-base deficit 2.9 (*) 0.0 - 2.0 mmol/L   O2 Saturation 100.0     Patient temperature 98.6     Collection site RIGHT RADIAL     Drawn by [id number]     Sample type ARTERIAL DRAW     Allens test (pass/fail) PASS  PASS  CBC     Status: Abnormal   Collection Time    03/30/14  6:37 AM      Result Value Ref Range   WBC 8.5  4.0 - 10.5 K/uL   RBC 2.73 (*) 4.22 - 5.81 MIL/uL   Hemoglobin 8.0 (*) 13.0 - 17.0 g/dL   HCT 24.7 (*) 39.0 - 52.0 %   MCV 90.5  78.0 - 100.0 fL   MCH 29.3  26.0 - 34.0 pg   MCHC 32.4  30.0 - 36.0 g/dL   RDW 17.2 (*) 11.5 - 15.5 %    Platelets 212  150 - 400 K/uL  BASIC METABOLIC PANEL     Status: Abnormal   Collection Time    03/30/14  6:37 AM      Result Value Ref Range   Sodium 137  137 - 147 mEq/L   Potassium 5.4 (*) 3.7 - 5.3 mEq/L   Chloride 101  96 - 112 mEq/L   CO2 19  19 - 32 mEq/L   Glucose, Bld 93  70 - 99 mg/dL   BUN 54 (*) 6 - 23 mg/dL   Creatinine, Ser 7.43 (*) 0.50 - 1.35 mg/dL   Calcium 8.3 (*) 8.4 - 10.5 mg/dL   GFR calc non Af Amer 8 (*) >90 mL/min   GFR calc Af Amer 10 (*) >90 mL/min   Comment: (NOTE)     The eGFR has been calculated using the CKD EPI equation.     This calculation has not been validated in all clinical situations.     eGFR's persistently <90 mL/min signify possible  Chronic Kidney     Disease.  MAGNESIUM     Status: None   Collection Time    03/30/14  6:37 AM      Result Value Ref Range   Magnesium 2.2  1.5 - 2.5 mg/dL  PHOSPHORUS     Status: Abnormal   Collection Time    03/30/14  6:37 AM      Result Value Ref Range   Phosphorus 5.6 (*) 2.3 - 4.6 mg/dL  TROPONIN I     Status: Abnormal   Collection Time    03/30/14  6:37 AM      Result Value Ref Range   Troponin I 0.45 (*) <0.30 ng/mL   Comment:            Due to the release kinetics of cTnI,     a negative result within the first hours     of the onset of symptoms does not rule out     myocardial infarction with certainty.     If myocardial infarction is still suspected,     repeat the test at appropriate intervals.     CRITICAL RESULT CALLED TO, READ BACK BY AND VERIFIED WITH:     L.BASS,RN [id number] 03/30/14 CLARK,S  TRIGLYCERIDES     Status: None   Collection Time    03/30/14  6:37 AM      Result Value Ref Range   Triglycerides 135  <150 mg/dL  GLUCOSE, CAPILLARY     Status: None   Collection Time    03/30/14  8:31 AM      Result Value Ref Range   Glucose-Capillary 87  70 - 99 mg/dL  TROPONIN I     Status: Abnormal   Collection Time    03/30/14 10:35 AM      Result Value Ref Range   Troponin I 0.56 (*)  <0.30 ng/mL   Comment:            Due to the release kinetics of cTnI,     a negative result within the first hours     of the onset of symptoms does not rule out     myocardial infarction with certainty.     If myocardial infarction is still suspected,     repeat the test at appropriate intervals.     CRITICAL VALUE NOTED.  VALUE IS CONSISTENT WITH PREVIOUSLY REPORTED AND CALLED VALUE.  GLUCOSE, CAPILLARY     Status: None   Collection Time    03/30/14 11:45 AM      Result Value Ref Range   Glucose-Capillary 92  70 - 99 mg/dL  GLUCOSE, CAPILLARY     Status: None   Collection Time    03/30/14  3:25 PM      Result Value Ref Range   Glucose-Capillary 73  70 - 99 mg/dL  TROPONIN I     Status: Abnormal   Collection Time    03/30/14  3:55 PM      Result Value Ref Range   Troponin I 0.51 (*) <0.30 ng/mL   Comment:            Due to the release kinetics of cTnI,     a negative result within the first hours     of the onset of symptoms does not rule out     myocardial infarction with certainty.     If myocardial infarction is still suspected,     repeat the test at appropriate intervals.  CRITICAL VALUE NOTED.  VALUE IS CONSISTENT WITH PREVIOUSLY REPORTED AND CALLED VALUE.  GLUCOSE, CAPILLARY     Status: None   Collection Time    03/30/14  7:20 PM      Result Value Ref Range   Glucose-Capillary 76  70 - 99 mg/dL  TROPONIN I     Status: Abnormal   Collection Time    03/30/14 11:30 PM      Result Value Ref Range   Troponin I 0.80 (*) <0.30 ng/mL   Comment:            Due to the release kinetics of cTnI,     a negative result within the first hours     of the onset of symptoms does not rule out     myocardial infarction with certainty.     If myocardial infarction is still suspected,     repeat the test at appropriate intervals.     CRITICAL VALUE NOTED.  VALUE IS CONSISTENT WITH PREVIOUSLY REPORTED AND CALLED VALUE.  GLUCOSE, CAPILLARY     Status: None   Collection Time     03/31/14 12:27 AM      Result Value Ref Range   Glucose-Capillary 85  70 - 99 mg/dL   Comment 1 Documented in Chart     Comment 2 Notify RN    GLUCOSE, CAPILLARY     Status: None   Collection Time    03/31/14  3:55 AM      Result Value Ref Range   Glucose-Capillary 79  70 - 99 mg/dL   Comment 1 Documented in Chart     Comment 2 Notify RN    TROPONIN I     Status: Abnormal   Collection Time    03/31/14  4:30 AM      Result Value Ref Range   Troponin I 0.71 (*) <0.30 ng/mL   Comment:            Due to the release kinetics of cTnI,     a negative result within the first hours     of the onset of symptoms does not rule out     myocardial infarction with certainty.     If myocardial infarction is still suspected,     repeat the test at appropriate intervals.     CRITICAL VALUE NOTED.  VALUE IS CONSISTENT WITH PREVIOUSLY REPORTED AND CALLED VALUE.  CBC WITH DIFFERENTIAL     Status: Abnormal   Collection Time    03/31/14  4:30 AM      Result Value Ref Range   WBC 6.6  4.0 - 10.5 K/uL   RBC 2.81 (*) 4.22 - 5.81 MIL/uL   Hemoglobin 8.1 (*) 13.0 - 17.0 g/dL   HCT 25.2 (*) 39.0 - 52.0 %   MCV 89.7  78.0 - 100.0 fL   MCH 28.8  26.0 - 34.0 pg   MCHC 32.1  30.0 - 36.0 g/dL   RDW 16.9 (*) 11.5 - 15.5 %   Platelets 221  150 - 400 K/uL   Neutrophils Relative % 74  43 - 77 %   Neutro Abs 4.9  1.7 - 7.7 K/uL   Lymphocytes Relative 14  12 - 46 %   Lymphs Abs 0.9  0.7 - 4.0 K/uL   Monocytes Relative 9  3 - 12 %   Monocytes Absolute 0.6  0.1 - 1.0 K/uL   Eosinophils Relative 2  0 - 5 %   Eosinophils Absolute  0.1  0.0 - 0.7 K/uL   Basophils Relative 1  0 - 1 %   Basophils Absolute 0.0  0.0 - 0.1 K/uL  BASIC METABOLIC PANEL     Status: Abnormal   Collection Time    03/31/14  4:30 AM      Result Value Ref Range   Sodium 143  137 - 147 mEq/L   Potassium 5.6 (*) 3.7 - 5.3 mEq/L   Chloride 103  96 - 112 mEq/L   CO2 19  19 - 32 mEq/L   Glucose, Bld 70  70 - 99 mg/dL   BUN 66 (*) 6 - 23  mg/dL   Creatinine, Ser 8.49 (*) 0.50 - 1.35 mg/dL   Calcium 8.1 (*) 8.4 - 10.5 mg/dL   GFR calc non Af Amer 7 (*) >90 mL/min   GFR calc Af Amer 8 (*) >90 mL/min   Comment: (NOTE)     The eGFR has been calculated using the CKD EPI equation.     This calculation has not been validated in all clinical situations.     eGFR's persistently <90 mL/min signify possible Chronic Kidney     Disease.  MAGNESIUM     Status: None   Collection Time    03/31/14  4:30 AM      Result Value Ref Range   Magnesium 2.3  1.5 - 2.5 mg/dL  PHOSPHORUS     Status: Abnormal   Collection Time    03/31/14  4:30 AM      Result Value Ref Range   Phosphorus 6.5 (*) 2.3 - 4.6 mg/dL  CK TOTAL AND CKMB     Status: None   Collection Time    03/31/14  4:30 AM      Result Value Ref Range   Total CK 75  7 - 232 U/L   CK, MB 2.1  0.3 - 4.0 ng/mL   Relative Index RELATIVE INDEX IS INVALID  0.0 - 2.5   Comment: WHEN CK < 100 U/L             LACTIC ACID, PLASMA     Status: None   Collection Time    03/31/14  4:50 AM      Result Value Ref Range   Lactic Acid, Venous 0.8  0.5 - 2.2 mmol/L  GLUCOSE, CAPILLARY     Status: None   Collection Time    03/31/14  8:38 AM      Result Value Ref Range   Glucose-Capillary 72  70 - 99 mg/dL  GLUCOSE, CAPILLARY     Status: None   Collection Time    03/31/14 12:20 PM      Result Value Ref Range   Glucose-Capillary 82  70 - 99 mg/dL    EKG: NSR. ST, marked LVH  IMAGING: Ct Angio Head W/cm &/or Wo Cm  03/30/2014   CLINICAL DATA:  Intracranial hemorrhage.  Rule out aneurysm.  EXAM: CT ANGIOGRAPHY HEAD  TECHNIQUE: Multidetector CT imaging of the head was performed using the standard protocol during bolus administration of intravenous contrast. Multiplanar CT image reconstructions and MIPs were obtained to evaluate the vascular anatomy.  CONTRAST:  64m OMNIPAQUE IOHEXOL 350 MG/ML SOLN  COMPARISON:  Head CT 03/29/2014  FINDINGS: 2.0 x 1.6 cm acute parenchymal hemorrhage in the  posterior left temporal lobe with mild surrounding edema does not appear significantly changed. There is no evidence of new intracranial hemorrhage. Patchy hypoattenuation in the pons is unchanged from 06/29/2013, compatible with  chronic ischemic change. Old lacunar infarcts are noted in the thalami. Moderate periventricular white matter chronic small vessel ischemic disease is unchanged. Remote infarct is also noted adjacent to the left frontal MRI. No abnormal enhancement is identified. Ventricles are mildly prominent for age, unchanged. There is no midline shift or extra-axial fluid collection. Orbits are unremarkable. Mild right frontal sinus mucosal thickening is noted. There is a small amount of right maxillary sinus fluid.  The visualized distal vertebral arteries are patent with the left being dominant. PICA origins are patent. AICA and SCA origins are patent. Basilar artery is patent without stenosis. There is a fetal origin of the left PCA with a hypoplastic left P1 segment. Patent right posterior communicating artery is not identified. There is mild bilateral PCA branch vessel irregularity without significant proximal stenosis.  Internal carotid arteries are patent from skull base to carotid termini. There is mild ACA and MCA branch vessel irregularity bilaterally without evidence of significant proximal stenosis. No intracranial aneurysm is identified.  Review of the MIP images confirms the above findings.  IMPRESSION: 1. No intracranial aneurysm identified. 2. Mild anterior and posterior circulation branches vessel irregularity, suggestive of mild intracranial atherosclerosis. No evidence of significant proximal stenosis. 3. Unchanged, acute left temporal lobe parenchymal hemorrhage.   Electronically Signed   By: Logan Bores   On: 03/30/2014 11:48   Ct Head Wo Contrast  03/29/2014   CLINICAL DATA:  Confusion  EXAM: CT HEAD WITHOUT CONTRAST  TECHNIQUE: Contiguous axial images were obtained from the  base of the skull through the vertex without intravenous contrast.  COMPARISON:  None.  FINDINGS: The examination is significantly limited by patient motion artifact. Mild atrophic changes and chronic white matter ischemic changes are seen. There is a focus of parenchymal hemorrhage at the junction of the left temporal and left parietal lobes which measures 1.6 x 1.6 cm. No other focal abnormality is noted.  IMPRESSION: Significantly limited exam by patient motion artifact.  A focus of hemorrhage is noted as described above at the junction of the left temporal and parietal lobes. Mild surrounding edema is noted.  Critical Value/emergent results were called by telephone at the time of interpretation on 03/29/2014 at 11:48 PM to the emergency room physician, who verbally acknowledged these results.   Electronically Signed   By: Inez Catalina M.D.   On: 03/29/2014 23:48   Mr Brain Wo Contrast  03/31/2014   CLINICAL DATA:  Intracranial hemorrhage. End-stage renal disease, non compliant. Blood pressure elevated on initial presentation.  EXAM: MRI HEAD WITHOUT CONTRAST  TECHNIQUE: Multiplanar, multiecho pulse sequences of the brain and surrounding structures were obtained without intravenous contrast.  COMPARISON:  CT head 03/29/2014.  CTA 03/30/2014.  FINDINGS: Multifocal subcentimeter areas of acute infarction affect the bilateral frontal, parietal, temporal, and occipital regions, as well as the right cerebellum. Their distribution, in the junction zones between the ACA/ MCA, and MCA/PCA vascular territories, suggests a watershed type event, possible arrhythmia or hypotensive episode. No features suggestive of PRES.  Re-demonstrated is a roughly spherical 18 mm acute hemorrhage located in the left temporal lobe with moderate surrounding edema consistent with an acute intracerebral hematoma. There is no midline shift or uncal herniation. No features suggestive of hemorrhagic metastasis. No abnormalities to suggest of  vascular lesion such as aneurysm or AVM. There is an old chronic hemorrhage involving the genu of the corpus callosum. Too numerous to count microbleeds, seen best on gradient sequence throughout the cerebral hemispheres, brainstem, and cerebellum suggests  that this lesion represents a complication of longstanding hypertensive cerebrovascular disease. Cerebral amyloid angiopathy is not excluded, but less favored.  Premature for age cerebral and cerebellar atrophy. There is extensive T2 and FLAIR hyperintensities throughout the periventricular and subcortical white matter, also involving the brainstem, consistent with chronic microvascular ischemic change. Flow voids are maintained in the major intracranial vessels. Large remote lacunar infarcts are seen in the brainstem, thalamus, and subcortical white matter.  Partial empty sella. No tonsillar herniation. No worrisome osseous lesions. Marrow changes consistent with end-stage renal disease with anemia. Negative orbits. No acute sinus or mastoid disease.  IMPRESSION: Findings most consistent with an acute 1.8 cm left temporal lobe intracerebral hematoma as a complication of hypertensive cerebrovascular disease. Widespread microbleeds, chronic corpus callosum hemorrhagic infarction, multiple lacunes, and microvascular ischemic change throughout the white matter are also noted. Lobar hemorrhage from cerebral amyloid angiopathy is not excluded but less favored.  Multiple subcentimeter acute infarcts throughout the supratentorial region also affecting the right cerebellum; watershed type event is favored; correlate clinically with arrhythmia or hypotensive episode.   Electronically Signed   By: Rolla Flatten M.D.   On: 03/31/2014 12:29   Dg Chest Port 1 View  03/31/2014   CLINICAL DATA:  Evaluate endotracheal tube position.  EXAM: PORTABLE CHEST - 1 VIEW  COMPARISON:  DG CHEST 1V PORT dated 03/30/2014; DG CHEST 1V PORT dated 03/30/2014; DG CHEST 1V PORT dated 03/29/2014;  DG CHEST 1V PORT dated 03/08/2014  FINDINGS: Cardiopericardial silhouette is enlarged. Endotracheal tube tip is 48 mm from the carina. Right IJ vas cath present, unchanged. Enteric tube is present with the tip not visualized. Diffuse bilateral basilar predominant airspace disease, most compatible with pulmonary edema. No large pleural effusions are identified. Monitoring leads project over the chest.  IMPRESSION: 1. Support apparatus in good position, detailed above. 2. Slight worsening pulmonary aeration with lower volumes and more prominent basilar opacity, most consistent with pulmonary edema based on symmetry.   Electronically Signed   By: Dereck Ligas M.D.   On: 03/31/2014 07:57   Dg Chest Port 1 View  03/30/2014   CLINICAL DATA:  Placement of endotracheal tube, central line, and NG tube  EXAM: PORTABLE CHEST - 1 VIEW  COMPARISON:  DG CHEST 1V PORT dated 03/30/2014; DG CHEST 1V PORT dated 03/29/2014; DG CHEST 1V PORT dated 03/08/2014  FINDINGS: Endotracheal tube is identified with tip 3.5 cm above the carinal. There is a right internal jugular central line with tip projecting over the right mainstem bronchus. An NG tube is identified crossing the gastroesophageal junction, with its tip not identified.  Mild cardiac enlargement. Hazy right lower lobe and left lower lobe opacities.  IMPRESSION: 1. Stable right lower lung zone and left lower lung zone infiltrate with improved aeration in the left upper lobe when compared to prior study. 2. Lines and tubes as described. Position of right internal jugular central line tip indeterminate. It projects over an area that could be associated with the superior vena cava. Distinction between potential arterial or venous location cannot be made on this single projection radiograph. Correlate clinically.   Electronically Signed   By: Skipper Cliche M.D.   On: 03/30/2014 19:40   Dg Chest Port 1 View  03/30/2014   CLINICAL DATA:  Status post intubation  EXAM: PORTABLE  CHEST - 1 VIEW  COMPARISON:  03/29/2014  FINDINGS: Cardiac shadow remains enlarged. Endotracheal tube is now noted 4.6 cm above the carina. Increasing infiltrative changes are noted bilaterally but  particularly in the left mid lung when compared with the prior study. No acute bony abnormality is seen.  IMPRESSION: Endotracheal tube as described.  Increasing bilateral infiltrative changes.   Electronically Signed   By: Inez Catalina M.D.   On: 03/30/2014 00:21   Dg Chest Port 1 View  03/29/2014   CLINICAL DATA:  Loss of consciousness.  Dialysis patient.  EXAM: PORTABLE CHEST - 1 VIEW  COMPARISON:  DG CHEST 1V PORT dated 03/08/2014  FINDINGS: Cardiac enlargement without vascular congestion or edema. No focal consolidation or airspace disease. No blunting of costophrenic angles. No pneumothorax. No significant change since prior study.  IMPRESSION: Cardiac enlargement.  No evidence of active pulmonary disease.   Electronically Signed   By: Lucienne Capers M.D.   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 MD to review. Will follow.   Erlene Quan [phone] beeper 03/31/2014, 3:07 PM   I have personally seen and examined this patient with Beatris Si, PA-c. I agree with the assessment and plan as outlined above. Pt with long standing cardiomyopathy presumed to be non-ischemic. He has been non-compliant with medications and dialysis. SBP severely elevated on admission. Pt found to have intracranial hemorrhage. LVEF is down from most recent study but studies over the  years with this degree of LV dysfunction. At this time, his cardiomyopathy is a secondary issue and not contributing to his current presentation. His cardiomyopathy is likely due to uncontrolled HTN. If he recovers from this acute event, can consider ischemic evaluation but his overall prognosis is very poor with the non-compliance he has demonstrated recently. We will follow with you.   Burnell Blanks 03/31/2014 3:26 PM

## 2014-03-31 NOTE — Progress Notes (Signed)
Stroke Team Progress Note  HISTORY Rondle DEMETRY PARMETER is a 38 y.o. 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  Patient was home with his children when suddenly collapsed to the ground and had a generalized convulsion. EMS called and patient brought to the ED where He was confused with SBP 249.  Urgent CT brain revealed a focus of parenchymal hemorrhage at the junction of the left temporal and left parietal lobes which measures 1.6 x 1.6 cm.Mild surrounding edema but no mass effect or IV extension.  PTT 37. INR 1.30. Platelets 198  Patient was not protecting his airway and was intubated in the ED.  Loaded with 1 gram IV keppra. Nicardipine infusion started.   Date last known well: 03/29/14  Time last known well: unclear  tPA Given: no, ICH   SUBJECTIVE No family present. The patient is intubated and sedated on propofol. He withdraws to pain.  OBJECTIVE Most recent Vital Signs: Filed Vitals:   03/31/14 0900 03/31/14 0946 03/31/14 1019 03/31/14 1020  BP: 140/91 137/91 133/87 133/87  Pulse: 77 75    Temp:      TempSrc:      Resp: 24 19    Height:      Weight:      SpO2: 98% 98%     CBG (last 3)   Recent Labs  03/31/14 0027 03/31/14 0355 03/31/14 0838  GLUCAP 85 79 72    IV Fluid Intake:   . niCARDipine Stopped (03/30/14 1900)  . dialysis replacement fluid (prismasate)    . dialysis replacement fluid (prismasate)    . dialysate (PRISMASATE)    . propofol 50 mcg/kg/min (03/31/14 1022)    MEDICATIONS  . antiseptic oral rinse  15 mL Mouth Rinse QID  . carvedilol  25 mg Per Tube BID WC  . chlorhexidine  15 mL Mouth Rinse BID  . cloNIDine  0.2 mg Per Tube TID  . darbepoetin (ARANESP) injection - DIALYSIS  100 mcg Intravenous Q Wed-HD  . doxercalciferol  4 mcg Intravenous Q  M,W,F-HD  . ferric gluconate (FERRLECIT/NULECIT) IV  125 mg Intravenous Q M,W,F-HD  . hydrALAZINE  100 mg Per Tube TID  . imipenem-cilastatin  250 mg Intravenous Q12H  . insulin aspart  0-15 Units Subcutaneous 6 times per day  . levETIRAcetam  500 mg Intravenous Q12H  . pantoprazole sodium  40 mg Per Tube Daily   PRN:  sodium chloride, fentaNYL, heparin, sodium chloride  Diet:  NPO no liquids Activity:  Bedrest DVT Prophylaxis:  SCDs  CLINICALLY SIGNIFICANT STUDIES Basic Metabolic Panel:   Recent Labs Lab 03/30/14 0637 03/31/14 0430  NA 137 143  K 5.4* 5.6*  CL 101 103  CO2 19 19  GLUCOSE 93 70  BUN 54* 66*  CREATININE 7.43* 8.49*  CALCIUM 8.3* 8.1*  MG 2.2 2.3  PHOS 5.6* 6.5*   Liver Function Tests:   Recent Labs Lab 03/29/14 2255  AST 20  ALT 19  ALKPHOS 80  BILITOT 0.5  PROT 7.8  ALBUMIN 2.9*   CBC:   Recent Labs Lab 03/29/14 2255 03/30/14 0637 03/31/14 0430  WBC 9.7 8.5 6.6  NEUTROABS 8.4*  --  4.9  HGB 8.4* 8.0* 8.1*  HCT 25.9* 24.7* 25.2*  MCV 91.5 90.5 89.7  PLT 198 212 221   Coagulation:  Recent Labs Lab 03/30/14 0052  LABPROT 15.9*  INR 1.30   Cardiac Enzymes:   Recent Labs Lab 03/30/14 1555 03/30/14 2330 03/31/14 0430  CKTOTAL  --   --  75  CKMB  --   --  2.1  TROPONINI 0.51* 0.80* 0.71*   Urinalysis:   Recent Labs Lab 03/29/14 2251  COLORURINE YELLOW  LABSPEC 1.019  PHURINE 6.5  GLUCOSEU NEGATIVE  HGBUR LARGE*  BILIRUBINUR NEGATIVE  KETONESUR NEGATIVE  PROTEINUR >300*  UROBILINOGEN 1.0  NITRITE NEGATIVE  LEUKOCYTESUR NEGATIVE   Lipid Panel    Component Value Date/Time   CHOL 187 01/22/2012 0515   TRIG 135 03/30/2014 0637   HDL 38* 01/22/2012 0515   CHOLHDL 4.9 01/22/2012 0515   VLDL 25 01/22/2012 0515   LDLCALC 124* 01/22/2012 0515   HgbA1C  Lab Results  Component Value Date   HGBA1C 5.3 03/08/2014    Urine Drug Screen:     Component Value Date/Time   LABOPIA NONE DETECTED 03/29/2014 2251   LABOPIA NEGATIVE  03/08/2014 2108   COCAINSCRNUR NONE DETECTED 03/29/2014 2251   COCAINSCRNUR NEGATIVE 03/08/2014 2108   LABBENZ NONE DETECTED 03/29/2014 2251   LABBENZ NEGATIVE 03/08/2014 2108   AMPHETMU NONE DETECTED 03/29/2014 2251   AMPHETMU NEGATIVE 03/08/2014 2108   THCU NONE DETECTED 03/29/2014 2251   LABBARB NONE DETECTED 03/29/2014 2251    Alcohol Level: No results found for this basename: ETH,  in the last 168 hours  Ct Head Wo Contrast 03/29/2014    Significantly limited exam by patient motion artifact.  A focus of hemorrhage is noted as described above at the junction of the left temporal and parietal lobes. Mild surrounding edema is noted.      Dg Chest Port 1 View 03/30/2014 Endotracheal tube as described.  Increasing bilateral infiltrative changes.   Electronically Signed   By: Alcide CleverMark  Lukens M.D.   On: 03/30/2014 00:21   Dg Chest Port 1 View 03/29/2014    Cardiac enlargement.  No evidence of active pulmonary disease.    CT Angio of head 03/30/2014 1. No intracranial aneurysm identified. 2. Mild anterior and posterior circulation branches vessel irregularity, suggestive of mild intracranial atherosclerosis. No evidence of significant proximal stenosis. 3. Unchanged, acute left temporal lobe parenchymal hemorrhage.       2D Echocardiogram  Left ventricle: The cavity size was normal. Wall thickness was increased in a pattern of severe LVH. Systolic function was severely reduced. The estimated ejection fraction was in the range of 25% to 30%. Severe hypokinesis of the entire myocardium.    Carotid Doppler  pending  CXR    EKG  Normal sinus rhythm rate 96 beats per minute.  For complete results please see formal report.   Therapy Recommendations pending  Physical Exam   Mental status: on propofol sedated. stuporose and barely arousable CN 2-12" pupils 3-4 mm bilaterally, reactive. No gaze preference. No nystagmus. Corneal reflex present    Tongue: midline.intubated.  Motor: Mild  withdrawal in all 4 extremities  Right greater than left to noxious stimuli.  Sensory: mild reaction to pain but on propofol.  DTR's: unable to elicit, on propofol  Coordination and gait: unable to test.   ASSESSMENT [name] is a 38 y.o. male presenting with a witnessed seizure and collapse. TPA was not initiated secondary to hemorrhage. Head CT - a focus of hemorrhage is noted at the junction of the left temporal and parietal lobes.On no atherothrombotics prior to admission. Now on no  atherothrombotics for secondary stroke prevention. Patient with resultant unresponsiveness. Stroke work up underway.   Hypertension  History of noncompliance with medications  ESRD - HD  Nonischemic cardiomyopathy  History of lacunar infarcts  Anemia   Hospital day # 2  TREATMENT/PLAN  No antithrombotics at this time for secondary stroke prevention.  Await therapy evaluations  Await 2  carotid Dopplers  On Keppra   Keep systolic blood pressure less than 160  Minimize sedation and ventilator wean if possible  Check CT angiogram to evaluate for aneurysm/AVM given young age and parenchymal hematoma D/w Dr Marchelle Gearing,    Check EEG for silent seizures and cardiology consult for low EF 25 %    This patient is critically ill and at significant risk of neurological worsening, death and care requires constant monitoring of vital signs, hemodynamics,respiratory and cardiac monitoring,review of multiple databases, neurological assessment, discussion with family, other specialists and medical decision making of high complexity. I spent 30 minutes of neurocritical care time  in the care of  this patient.    Delia Heady, MD 03/31/2014, 12:16 PM     To contact Stroke Continuity provider, please refer to [email or web address]. After hours, contact General Neurology

## 2014-03-31 NOTE — Progress Notes (Signed)
EEG completed; results pending.    

## 2014-03-31 NOTE — Procedures (Signed)
History: 38 yo M with AMS following multiple embolic strokes with temporal hemorrhage.   Sedation: Propofol  Technique: This is a 17 channel routine scalp EEG performed at the bedside with bipolar and monopolar montages arranged in accordance to the international 10/20 system of electrode placement. One channel was dedicated to EKG recording.    Background: The background is dominated by generalized irregular slow activity. There are bifrontal periodic discharges with a frequency of approximately 1 Hz. These had a shifting bifrontal distribution with triphasic morphology. There is a markedly decreased with sleep and increase with arousal with these discharges.    Photic stimulation: Physiologic driving is not performed  EEG Abnormalities: 1) Triphasic waves 2) generalized slow activity  Clinical Interpretation: This EEG is consistent with a generalized nonspecific cerebral dysfunction. Triphasic waves, nonspecific, can be associated with metabolic encephalopathy such as renal failure.  Ritta Slot, MD [facility] [phone]  If 7pm- 7am, please page neurology on call as listed in AMION.

## 2014-03-31 NOTE — Progress Notes (Signed)
PULMONARY / CRITICAL CARE MEDICINE   Name: Matthew Beard MRN: [id number] DOB: 04/24/1976   PCP [name],[name], MD   ADMISSION DATE:  03/29/2014 CONSULTATION DATE:  03/30/14  REFERRING MD :  EDP PRIMARY SERVICE: PCCM  CHIEF COMPLAINT:  Cerebral hemorrhage  BRIEF PATIENT DESCRIPTION:   . Mr. Matthew Fasterittard is a 38 y.o. M with PMH significant for ESRD (non-compliant with dialysis), HTN, anemia, NICM, CHF, SOB, who presented to ED on 5/11 after he was at home and per ED notes, fell backwards and began to shake.  His fiances children witnessed the event and called her.  He was in his USOH prior to this episode although fiance notes that his health has not been very good of late.   In ED, initial BP was 209/142 and got as high as 249/144.  Head CT was obtained and revealed a small focal hemorrhage at junction of left parietal and temporal lobes. Pt was intubated due to agitation/attempting to pull at lines/confusion/concern for lack of ability to protect airway. Per pt's fiance, he is non-compliant with his home meds as well as dialysis and she believes that he was supposed to have dialysis today, however, missed it. Of note, pt was recently discharged from [facility] on 4/28 after he was admitted and treated for volume overload and found to have ESRD.   SIGNIFICANT EVENTS / STUDIES:  5/11 - fell backwards at home and began shaking, questionable loss of consciousness. 5/12 Head CT >>> small hemorrhage at junction of left temporal and parietal lobes.  Intubated by EDP for airway protection.  LINES / TUBES: OETT 5/12 >>> OGT 5/12 >>> Foley 5/12 >>> Rt IJ Trialysis cath 03/30/14 >>  CULTURES: Sputum 5/12 >>>  ANTIBIOTICS: Vanc 5/11 >>5/11 Maxipime 5/11 >>>5/12 Imipenem  5/12 >>5/13 Levaquin 5/13 (aspn) >>       EVENTS   03/30/14: Dr Pearlean BrownieSethi recommends SBP 120-160 as goal. Patient very hypertensive and agitated. CT angio head shows no IC aneurysm but has acute left temporal lobe hge. : Remains  hypertensive on propofol and cardene.  Intubated by EDP.    SUBJECTIVE/OVERNIGHT/INTERVAL HX  03/31/14: MRI with 1.8cm acute left temporal lobe bleed with multiple micro infarcts and bleed. On diprivan: RASS -3 but doing sbt.   Off cardene. On serveral home bp meds - sbp 111 . ECHO shows sudden drop in EF from 50% in march 2015 tor 25% now. Global hypokinesis  VITAL SIGNS: Temp:  [98.3 F (36.8 C)-99.2 F (37.3 C)] 98.3 F (36.8 C) (05/13 1100) Pulse Rate:  [66-80] 68 (05/13 1202) Resp:  [15-27] 24 (05/13 1202) BP: (109-166)/(50-93) 117/74 mmHg (05/13 1202) SpO2:  [96 %-100 %] 100 % (05/13 1202) FiO2 (%):  [40 %] 40 % (05/13 1202) Weight:  [85.7 kg (188 lb 15 oz)] 85.7 kg (188 lb 15 oz) (05/13 0500) HEMODYNAMICS:   VENTILATOR SETTINGS: Vent Mode:  [-] CPAP;PSV FiO2 (%):  [40 %] 40 % Set Rate:  [14 bmp] 14 bmp Vt Set:  [580 mL] 580 mL PEEP:  [5 cmH20] 5 cmH20 Pressure Support:  [10 cmH20] 10 cmH20 Plateau Pressure:  [24 cmH20-25 cmH20] 24 cmH20 INTAKE / OUTPUT: Intake/Output     05/12 0701 - 05/13 0700 05/13 0701 - 05/14 0700   I.V. (mL/kg) 927.1 (10.8) 38.4 (0.4)   IV Piggyback 515    Total Intake(mL/kg) 1442.1 (16.8) 38.4 (0.4)   Urine (mL/kg/hr) 583 (0.3)    Emesis/NG output 1015 (0.5)    Total Output 1598  Net -155.9 +38.4        Stool Occurrence 1 x      PHYSICAL EXAMINATION: General: Young AA male, , hypertensive. Neuro:  Intubated/sedated on propofol, RASS -3 . Off diprivan earlier: RASS +3 HEENT: [place]/AT. PERRL, sclerae anicteric. Cardiovascular: tachy but regular, no M/R/G.  Lungs: Respirations even and unlabored on full vent support.  CTA bilaterally, No W/R/R.  Abdomen: BS x 4, soft, NT/ND.  Musculoskeletal: No gross deformities, 2+ edema to LE's bilaterally.  Fistula noted LUE. Skin: Intact, warm, no rashes.    LABS: PULMONARY  Recent Labs Lab 03/29/14 2307 03/30/14 0122 03/30/14 0422 03/30/14 0559  PHART 7.463* 7.424 7.431 7.429  PCO2ART  28.4* 33.9* 31.4* 31.8*  PO2ART 69.0* 102.0* 46.6* 329.0*  HCO3 20.0 22.0 20.5 20.7  TCO2 21 23 21.5 21.7  O2SAT 93.0 98.0 77.2 100.0    CBC  Recent Labs Lab 03/29/14 2255 03/30/14 0637 03/31/14 0430  HGB 8.4* 8.0* 8.1*  HCT 25.9* 24.7* 25.2*  WBC 9.7 8.5 6.6  PLT 198 212 221    COAGULATION  Recent Labs Lab 03/30/14 0052  INR 1.30    CARDIAC    Recent Labs Lab 03/30/14 0637 03/30/14 1035 03/30/14 1555 03/30/14 2330 03/31/14 0430  TROPONINI 0.45* 0.56* 0.51* 0.80* 0.71*   No results found for this basename: PROBNP,  in the last 168 hours   CHEMISTRY  Recent Labs Lab 03/29/14 2255 03/30/14 0637 03/31/14 0430  NA 138 137 143  K 5.6* 5.4* 5.6*  CL 99 101 103  CO2 20 19 19   GLUCOSE 94 93 70  BUN 48* 54* 66*  CREATININE 7.21* 7.43* 8.49*  CALCIUM 8.4 8.3* 8.1*  MG  --  2.2 2.3  PHOS  --  5.6* 6.5*   Estimated Creatinine Clearance: 12.2 ml/min (by C-G formula based on Cr of 8.49).   LIVER  Recent Labs Lab 03/29/14 2255 03/30/14 0052  AST 20  --   ALT 19  --   ALKPHOS 80  --   BILITOT 0.5  --   PROT 7.8  --   ALBUMIN 2.9*  --   INR  --  1.30     INFECTIOUS  Recent Labs Lab 03/29/14 2314 03/31/14 0450  LATICACIDVEN 2.71* 0.8     ENDOCRINE CBG (last 3)   Recent Labs  03/31/14 0355 03/31/14 0838 03/31/14 1220  GLUCAP 79 72 82         IMAGING x48h  Ct Angio Head W/cm &/or Wo Cm  03/30/2014   CLINICAL DATA:  Intracranial hemorrhage.  Rule out aneurysm.  EXAM: CT ANGIOGRAPHY HEAD  TECHNIQUE: Multidetector CT imaging of the head was performed using the standard protocol during bolus administration of intravenous contrast. Multiplanar CT image reconstructions and MIPs were obtained to evaluate the vascular anatomy.  CONTRAST:  2mL OMNIPAQUE IOHEXOL 350 MG/ML SOLN  COMPARISON:  Head CT 03/29/2014  FINDINGS: 2.0 x 1.6 cm acute parenchymal hemorrhage in the posterior left temporal lobe with mild surrounding edema does not  appear significantly changed. There is no evidence of new intracranial hemorrhage. Patchy hypoattenuation in the pons is unchanged from 06/29/2013, compatible with chronic ischemic change. Old lacunar infarcts are noted in the thalami. Moderate periventricular white matter chronic small vessel ischemic disease is unchanged. Remote infarct is also noted adjacent to the left frontal MRI. No abnormal enhancement is identified. Ventricles are mildly prominent for age, unchanged. There is no midline shift or extra-axial fluid collection. Orbits are unremarkable. Mild right frontal sinus  mucosal thickening is noted. There is a small amount of right maxillary sinus fluid.  The visualized distal vertebral arteries are patent with the left being dominant. PICA origins are patent. AICA and SCA origins are patent. Basilar artery is patent without stenosis. There is a fetal origin of the left PCA with a hypoplastic left P1 segment. Patent right posterior communicating artery is not identified. There is mild bilateral PCA branch vessel irregularity without significant proximal stenosis.  Internal carotid arteries are patent from skull base to carotid termini. There is mild ACA and MCA branch vessel irregularity bilaterally without evidence of significant proximal stenosis. No intracranial aneurysm is identified.  Review of the MIP images confirms the above findings.  IMPRESSION: 1. No intracranial aneurysm identified. 2. Mild anterior and posterior circulation branches vessel irregularity, suggestive of mild intracranial atherosclerosis. No evidence of significant proximal stenosis. 3. Unchanged, acute left temporal lobe parenchymal hemorrhage.   Electronically Signed   By: Sebastian Ache   On: 03/30/2014 11:48   Ct Head Wo Contrast  03/29/2014   CLINICAL DATA:  Confusion  EXAM: CT HEAD WITHOUT CONTRAST  TECHNIQUE: Contiguous axial images were obtained from the base of the skull through the vertex without intravenous contrast.   COMPARISON:  None.  FINDINGS: The examination is significantly limited by patient motion artifact. Mild atrophic changes and chronic white matter ischemic changes are seen. There is a focus of parenchymal hemorrhage at the junction of the left temporal and left parietal lobes which measures 1.6 x 1.6 cm. No other focal abnormality is noted.  IMPRESSION: Significantly limited exam by patient motion artifact.  A focus of hemorrhage is noted as described above at the junction of the left temporal and parietal lobes. Mild surrounding edema is noted.  Critical Value/emergent results were called by telephone at the time of interpretation on 03/29/2014 at 11:48 PM to the emergency room physician, who verbally acknowledged these results.   Electronically Signed   By: Alcide Clever M.D.   On: 03/29/2014 23:48   Mr Brain Wo Contrast  03/31/2014   CLINICAL DATA:  Intracranial hemorrhage. End-stage renal disease, non compliant. Blood pressure elevated on initial presentation.  EXAM: MRI HEAD WITHOUT CONTRAST  TECHNIQUE: Multiplanar, multiecho pulse sequences of the brain and surrounding structures were obtained without intravenous contrast.  COMPARISON:  CT head 03/29/2014.  CTA 03/30/2014.  FINDINGS: Multifocal subcentimeter areas of acute infarction affect the bilateral frontal, parietal, temporal, and occipital regions, as well as the right cerebellum. Their distribution, in the junction zones between the ACA/ MCA, and MCA/PCA vascular territories, suggests a watershed type event, possible arrhythmia or hypotensive episode. No features suggestive of PRES.  Re-demonstrated is a roughly spherical 18 mm acute hemorrhage located in the left temporal lobe with moderate surrounding edema consistent with an acute intracerebral hematoma. There is no midline shift or uncal herniation. No features suggestive of hemorrhagic metastasis. No abnormalities to suggest of vascular lesion such as aneurysm or AVM. There is an old chronic  hemorrhage involving the genu of the corpus callosum. Too numerous to count microbleeds, seen best on gradient sequence throughout the cerebral hemispheres, brainstem, and cerebellum suggests that this lesion represents a complication of longstanding hypertensive cerebrovascular disease. Cerebral amyloid angiopathy is not excluded, but less favored.  Premature for age cerebral and cerebellar atrophy. There is extensive T2 and FLAIR hyperintensities throughout the periventricular and subcortical white matter, also involving the brainstem, consistent with chronic microvascular ischemic change. Flow voids are maintained in the major intracranial vessels.  Large remote lacunar infarcts are seen in the brainstem, thalamus, and subcortical white matter.  Partial empty sella. No tonsillar herniation. No worrisome osseous lesions. Marrow changes consistent with end-stage renal disease with anemia. Negative orbits. No acute sinus or mastoid disease.  IMPRESSION: Findings most consistent with an acute 1.8 cm left temporal lobe intracerebral hematoma as a complication of hypertensive cerebrovascular disease. Widespread microbleeds, chronic corpus callosum hemorrhagic infarction, multiple lacunes, and microvascular ischemic change throughout the white matter are also noted. Lobar hemorrhage from cerebral amyloid angiopathy is not excluded but less favored.  Multiple subcentimeter acute infarcts throughout the supratentorial region also affecting the right cerebellum; watershed type event is favored; correlate clinically with arrhythmia or hypotensive episode.   Electronically Signed   By: Davonna Belling M.D.   On: 03/31/2014 12:29   Dg Chest Port 1 View  03/31/2014   CLINICAL DATA:  Evaluate endotracheal tube position.  EXAM: PORTABLE CHEST - 1 VIEW  COMPARISON:  DG CHEST 1V PORT dated 03/30/2014; DG CHEST 1V PORT dated 03/30/2014; DG CHEST 1V PORT dated 03/29/2014; DG CHEST 1V PORT dated 03/08/2014  FINDINGS: Cardiopericardial  silhouette is enlarged. Endotracheal tube tip is 48 mm from the carina. Right IJ vas cath present, unchanged. Enteric tube is present with the tip not visualized. Diffuse bilateral basilar predominant airspace disease, most compatible with pulmonary edema. No large pleural effusions are identified. Monitoring leads project over the chest.  IMPRESSION: 1. Support apparatus in good position, detailed above. 2. Slight worsening pulmonary aeration with lower volumes and more prominent basilar opacity, most consistent with pulmonary edema based on symmetry.   Electronically Signed   By: Andreas Newport M.D.   On: 03/31/2014 07:57   Dg Chest Port 1 View  03/30/2014   CLINICAL DATA:  Placement of endotracheal tube, central line, and NG tube  EXAM: PORTABLE CHEST - 1 VIEW  COMPARISON:  DG CHEST 1V PORT dated 03/30/2014; DG CHEST 1V PORT dated 03/29/2014; DG CHEST 1V PORT dated 03/08/2014  FINDINGS: Endotracheal tube is identified with tip 3.5 cm above the carinal. There is a right internal jugular central line with tip projecting over the right mainstem bronchus. An NG tube is identified crossing the gastroesophageal junction, with its tip not identified.  Mild cardiac enlargement. Hazy right lower lobe and left lower lobe opacities.  IMPRESSION: 1. Stable right lower lung zone and left lower lung zone infiltrate with improved aeration in the left upper lobe when compared to prior study. 2. Lines and tubes as described. Position of right internal jugular central line tip indeterminate. It projects over an area that could be associated with the superior vena cava. Distinction between potential arterial or venous location cannot be made on this single projection radiograph. Correlate clinically.   Electronically Signed   By: Esperanza Heir M.D.   On: 03/30/2014 19:40   Dg Chest Port 1 View  03/30/2014   CLINICAL DATA:  Status post intubation  EXAM: PORTABLE CHEST - 1 VIEW  COMPARISON:  03/29/2014  FINDINGS: Cardiac shadow  remains enlarged. Endotracheal tube is now noted 4.6 cm above the carina. Increasing infiltrative changes are noted bilaterally but particularly in the left mid lung when compared with the prior study. No acute bony abnormality is seen.  IMPRESSION: Endotracheal tube as described.  Increasing bilateral infiltrative changes.   Electronically Signed   By: Alcide Clever M.D.   On: 03/30/2014 00:21   Dg Chest Port 1 View  03/29/2014   CLINICAL DATA:  Loss of  consciousness.  Dialysis patient.  EXAM: PORTABLE CHEST - 1 VIEW  COMPARISON:  DG CHEST 1V PORT dated 03/08/2014  FINDINGS: Cardiac enlargement without vascular congestion or edema. No focal consolidation or airspace disease. No blunting of costophrenic angles. No pneumothorax. No significant change since prior study.  IMPRESSION: Cardiac enlargement.  No evidence of active pulmonary disease.   Electronically Signed   By: Burman Nieves M.D.   On: 03/29/2014 22:56      ASSESSMENT / PLAN:  NEUROLOGIC A:  ICH - likely secondary to significantly elevated BP in the setting of hypertensive emergency. Questionable seizure. Urine tox negative.   - intermittently agitated. MRI findings noted  P:   - Neurosurgery and neurology consulted. - Keppra. - Propofol gtt / Fentanyl PRN for sedation.; track lactic and ck - WUA again in PM  CARDIOVASCULAR A:  Hypertensive Emergency - BP up to as high as 249/144 (MAP 164) Hx of non-ischemic cardiomyopathy.     - new drop in EF to 25%. BP now below goal of 120-160 sbp  P:  - SBP goal 120-160 per neuro - stop home clonidine -  home carvedilol,  Hydralazine to contiue   PULMONARY A: VDRF - in the setting of ICH. Concern for aspiration.   - on 40% fio2. Doing SBT but mental status might preclude extubation P:   - SBT and do WUA at 13.20h. If does well, can extubate. Otherwise, full vent support - VAP bundle. - Empiric abx  .  RENAL A:  ESRD   - renal doing cvvh in setting of IC Hge and  VDRF   P:   - Nephrology consult.   GASTROINTESTINAL A:   NPO P:   - SUP: Pantoprazole. - TF start  HEMATOLOGIC A:   No acute issues. P:  - VTE prophylaxis:  SCD's only. - CBC in AM.  INFECTIOUS A:   Concern for aspiration. P:   -dc imipenem due to seizure hx - start levaquin (total 7d abx) - Monitor fever curve / WBC's.  ENDOCRINE A:   No acute issues. P:   - CBG's q4. - SSI.  03/30/14: Updated brothers and sisters at bedside  03/31/14: d/w Dr Hyman Hopes of renal and Dr Pearlean Brownie of neuro at bedside. No family today  The patient is critically ill with multiple organ systems failure and requires high complexity decision making for assessment and support, frequent evaluation and titration of therapies, application of advanced monitoring technologies and extensive interpretation of multiple databases.   Critical Care Time devoted to patient care services described in this note is  35 additional  Minutes.  Dr. Kalman Shan, M.D., [facility]C.P Pulmonary and Critical Care Medicine Staff Physician [facility] [place] Pulmonary and Critical Care Pager: [phone], If no answer or between  15:00h - 7:00h: call [phone]  03/31/2014 1:08 PM

## 2014-04-01 DIAGNOSIS — R4182 Altered mental status, unspecified: Secondary | ICD-10-CM

## 2014-04-01 DIAGNOSIS — I428 Other cardiomyopathies: Secondary | ICD-10-CM

## 2014-04-01 DIAGNOSIS — N186 End stage renal disease: Secondary | ICD-10-CM

## 2014-04-01 LAB — RENAL FUNCTION PANEL
ALBUMIN: 2.1 g/dL — AB (ref 3.5–5.2)
Albumin: 1.8 g/dL — ABNORMAL LOW (ref 3.5–5.2)
BUN: 45 mg/dL — AB (ref 6–23)
BUN: 35 mg/dL — AB (ref 6–23)
CHLORIDE: 100 meq/L (ref 96–112)
CO2: 21 meq/L (ref 19–32)
CO2: 22 mEq/L (ref 19–32)
Calcium: 7.5 mg/dL — ABNORMAL LOW (ref 8.4–10.5)
Calcium: 7.8 mg/dL — ABNORMAL LOW (ref 8.4–10.5)
Chloride: 104 mEq/L (ref 96–112)
Creatinine, Ser: 5.67 mg/dL — ABNORMAL HIGH (ref 0.50–1.35)
Creatinine, Ser: 4.54 mg/dL — ABNORMAL HIGH (ref 0.50–1.35)
GFR calc Af Amer: 17 mL/min — ABNORMAL LOW (ref >90)
GFR calc non Af Amer: 11 mL/min — ABNORMAL LOW (ref >90)
GFR calc non Af Amer: 15 mL/min — ABNORMAL LOW (ref >90)
GFR, EST AFRICAN AMERICAN: 13 mL/min — AB (ref >90)
GLUCOSE: 76 mg/dL (ref 70–99)
GLUCOSE: 82 mg/dL (ref 70–99)
POTASSIUM: 4.3 meq/L (ref 3.7–5.3)
Phosphorus: 5.1 mg/dL — ABNORMAL HIGH (ref 2.3–4.6)
Phosphorus: 4.6 mg/dL (ref 2.3–4.6)
Potassium: 4.3 mEq/L (ref 3.7–5.3)
Sodium: 140 mEq/L (ref 137–147)
Sodium: 136 mEq/L — ABNORMAL LOW (ref 137–147)

## 2014-04-01 LAB — CBC WITH DIFFERENTIAL/PLATELET
Basophils Absolute: 0 10*3/uL (ref 0.0–0.1)
Basophils Relative: 1 % (ref 0–1)
EOS ABS: 0.2 10*3/uL (ref 0.0–0.7)
EOS PCT: 3 % (ref 0–5)
HCT: 26.8 % — ABNORMAL LOW (ref 39.0–52.0)
HEMOGLOBIN: 8.9 g/dL — AB (ref 13.0–17.0)
LYMPHS ABS: 0.6 10*3/uL — AB (ref 0.7–4.0)
Lymphocytes Relative: 12 % (ref 12–46)
MCH: 29.6 pg (ref 26.0–34.0)
MCHC: 33.2 g/dL (ref 30.0–36.0)
MCV: 89 fL (ref 78.0–100.0)
Monocytes Absolute: 0.7 10*3/uL (ref 0.1–1.0)
Monocytes Relative: 13 % — ABNORMAL HIGH (ref 3–12)
Neutro Abs: 3.8 10*3/uL (ref 1.7–7.7)
Neutrophils Relative %: 71 % (ref 43–77)
Platelets: 194 10*3/uL (ref 150–400)
RBC: 3.01 MIL/uL — ABNORMAL LOW (ref 4.22–5.81)
RDW: 17.1 % — ABNORMAL HIGH (ref 11.5–15.5)
WBC: 5.3 10*3/uL (ref 4.0–10.5)

## 2014-04-01 LAB — BASIC METABOLIC PANEL
BUN: 46 mg/dL — ABNORMAL HIGH (ref 6–23)
CALCIUM: 7.5 mg/dL — AB (ref 8.4–10.5)
CO2: 21 mEq/L (ref 19–32)
CREATININE: 5.61 mg/dL — AB (ref 0.50–1.35)
Chloride: 105 mEq/L (ref 96–112)
GFR calc Af Amer: 13 mL/min — ABNORMAL LOW (ref >90)
GFR calc non Af Amer: 12 mL/min — ABNORMAL LOW (ref >90)
GLUCOSE: 77 mg/dL (ref 70–99)
Potassium: 4.3 mEq/L (ref 3.7–5.3)
Sodium: 141 mEq/L (ref 137–147)

## 2014-04-01 LAB — GLUCOSE, CAPILLARY
GLUCOSE-CAPILLARY: 80 mg/dL (ref 70–99)
GLUCOSE-CAPILLARY: 81 mg/dL (ref 70–99)
GLUCOSE-CAPILLARY: 83 mg/dL (ref 70–99)
Glucose-Capillary: 72 mg/dL (ref 70–99)
Glucose-Capillary: 86 mg/dL (ref 70–99)
Glucose-Capillary: 79 mg/dL (ref 70–99)

## 2014-04-01 LAB — CULTURE, RESPIRATORY W GRAM STAIN

## 2014-04-01 LAB — PHOSPHORUS: Phosphorus: 5.1 mg/dL — ABNORMAL HIGH (ref 2.3–4.6)

## 2014-04-01 LAB — MAGNESIUM: Magnesium: 2.3 mg/dL (ref 1.5–2.5)

## 2014-04-01 NOTE — Progress Notes (Signed)
[facility] ROUNDING NOTE   Subjective:   Interval History: somnolent,  Cerebral hemorrhage   Objective:  Vital signs in last 24 hours:  Temp:  [94.3 F (34.6 C)-98.6 F (37 C)] 98.3 F (36.8 C) (05/14 0855) Pulse Rate:  [32-128] 71 (05/14 0732) Resp:  [12-29] 19 (05/14 0732) BP: (100-171)/(61-109) 128/82 mmHg (05/14 0732) SpO2:  [77 %-100 %] 100 % (05/14 0732) FiO2 (%):  [40 %] 40 % (05/14 0732) Weight:  [83.3 kg (183 lb 10.3 oz)] 83.3 kg (183 lb 10.3 oz) (05/14 0437)  Weight change: -2.4 kg (-5 lb 4.7 oz) Filed Weights   03/30/14 0444 03/31/14 0500 04/01/14 0437  Weight: 82.5 kg (181 lb 14.1 oz) 85.7 kg (188 lb 15 oz) 83.3 kg (183 lb 10.3 oz)    Intake/Output: I/O last 3 completed shifts: In: 1484.4 [I.V.:694.4; NG/GT:120; IV Piggyback:670] Out: 1776 [Urine:800; Emesis/NG output:440; Other:536]   Intake/Output this shift:  Total I/O In: -  Out: 16 [Other:16]  CVS- RRR RS- CTA ABD- BS present soft non-distended EXT- no edema   Basic Metabolic Panel:  Recent Labs Lab 03/29/14 2255 03/30/14 0637 03/31/14 0430 03/31/14 1500 04/01/14 0400  NA 138 137 143 140 141  140  K 5.6* 5.4* 5.6* 4.4 4.3  4.3  CL 99 101 103 102 105  104  CO2 20 19 19 21 21  21   GLUCOSE 94 93 70 92 77  76  BUN 48* 54* 66* 63* 46*  45*  CREATININE 7.21* 7.43* 8.49* 7.57* 5.61*  5.67*  CALCIUM 8.4 8.3* 8.1* 7.9* 7.5*  7.5*  MG  --  2.2 2.3  --  2.3  PHOS  --  5.6* 6.5* 6.6* 5.1*  5.1*    Liver Function Tests:  Recent Labs Lab 03/29/14 2255 03/31/14 1500 04/01/14 0400  AST 20  --   --   ALT 19  --   --   ALKPHOS 80  --   --   BILITOT 0.5  --   --   PROT 7.8  --   --   ALBUMIN 2.9* 2.2* 1.8*   No results found for this basename: LIPASE, AMYLASE,  in the last 168 hours  Recent Labs Lab 03/29/14 2255  AMMONIA 16    CBC:  Recent Labs Lab 03/29/14 2255 03/30/14 0637 03/31/14 0430 04/01/14 0400  WBC 9.7 8.5 6.6 5.3  NEUTROABS 8.4*  --  4.9  3.8  HGB 8.4* 8.0* 8.1* 8.9*  HCT 25.9* 24.7* 25.2* 26.8*  MCV 91.5 90.5 89.7 89.0  PLT 198 212 221 194    Cardiac Enzymes:  Recent Labs Lab 03/30/14 0637 03/30/14 1035 03/30/14 1555 03/30/14 2330 03/31/14 0430  CKTOTAL  --   --   --   --  75  CKMB  --   --   --   --  2.1  TROPONINI 0.45* 0.56* 0.51* 0.80* 0.71*    BNP: No components found with this basename: POCBNP,   CBG:  Recent Labs Lab 03/31/14 1546 03/31/14 1912 04/01/14 0041 04/01/14 0331 04/01/14 0758  GLUCAP 85 86 80 81 72    Microbiology: Results for orders placed during the hospital encounter of 03/29/14  URINE CULTURE     Status: None   Collection Time    03/29/14 10:51 PM      Result Value Ref Range Status   Specimen Description URINE, CATHETERIZED   Final   Special Requests NONE   Final   Culture  Setup Time  Final   Value: 03/29/2014 23:32     Performed at [facility] Count     Final   Value: NO GROWTH     Performed at [facility]   Culture     Final   Value: NO GROWTH     Performed at [facility]   Report Status 03/31/2014 FINAL   Final  CULTURE, BLOOD (ROUTINE X 2)     Status: None   Collection Time    03/29/14 10:55 PM      Result Value Ref Range Status   Specimen Description BLOOD ARM RIGHT   Final   Special Requests BOTTLES DRAWN AEROBIC AND ANAEROBIC 5CC EA   Final   Culture  Setup Time     Final   Value: 03/30/2014 04:10     Performed at [facility]   Culture     Final   Value:        BLOOD CULTURE RECEIVED NO GROWTH TO DATE CULTURE WILL BE HELD FOR 5 DAYS BEFORE ISSUING A FINAL NEGATIVE REPORT     Performed at [facility]   Report Status PENDING   Incomplete  CULTURE, BLOOD (ROUTINE X 2)     Status: None   Collection Time    03/29/14 11:03 PM      Result Value Ref Range Status   Specimen Description BLOOD HAND LEFT   Final   Special Requests BOTTLES DRAWN AEROBIC ONLY 6CC   Final   Culture  Setup Time     Final    Value: 03/30/2014 04:10     Performed at [facility]   Culture     Final   Value:        BLOOD CULTURE RECEIVED NO GROWTH TO DATE CULTURE WILL BE HELD FOR 5 DAYS BEFORE ISSUING A FINAL NEGATIVE REPORT     Performed at [facility]   Report Status PENDING   Incomplete  MRSA PCR SCREENING     Status: None   Collection Time    03/30/14  4:20 AM      Result Value Ref Range Status   MRSA by PCR NEGATIVE  NEGATIVE Final   Comment:            The GeneXpert MRSA Assay (FDA     approved for NASAL specimens     only), is one component of a     comprehensive MRSA colonization     surveillance program. It is not     intended to diagnose MRSA     infection nor to guide or     monitor treatment for     MRSA infections.  CULTURE, RESPIRATORY (NON-EXPECTORATED)     Status: None   Collection Time    03/30/14  4:45 AM      Result Value Ref Range Status   Specimen Description TRACHEAL ASPIRATE   Final   Special Requests NONE   Final   Gram Stain     Final   Value: MODERATE WBC PRESENT,BOTH PMN AND MONONUCLEAR     NO SQUAMOUS EPITHELIAL CELLS SEEN     ABUNDANT GRAM POSITIVE COCCI     IN PAIRS     Performed at [facility]   Culture     Final   Value: Non-Pathogenic Oropharyngeal-type Flora Isolated.     Performed at [facility]   Report Status 04/01/2014 FINAL   Final    Coagulation Studies:  Recent Labs  03/30/14 0052  LABPROT 15.9*  INR 1.30    Urinalysis:  Recent Labs  03/29/14 2251  COLORURINE YELLOW  LABSPEC 1.019  PHURINE 6.5  GLUCOSEU NEGATIVE  HGBUR LARGE*  BILIRUBINUR NEGATIVE  KETONESUR NEGATIVE  PROTEINUR >300*  UROBILINOGEN 1.0  NITRITE NEGATIVE  LEUKOCYTESUR NEGATIVE      Imaging: Ct Angio Head W/cm &/or Wo Cm  03/30/2014   CLINICAL DATA:  Intracranial hemorrhage.  Rule out aneurysm.  EXAM: CT ANGIOGRAPHY HEAD  TECHNIQUE: Multidetector CT imaging of the head was performed using the standard protocol during bolus  administration of intravenous contrast. Multiplanar CT image reconstructions and MIPs were obtained to evaluate the vascular anatomy.  CONTRAST:  50mL OMNIPAQUE IOHEXOL 350 MG/ML SOLN  COMPARISON:  Head CT 03/29/2014  FINDINGS: 2.0 x 1.6 cm acute parenchymal hemorrhage in the posterior left temporal lobe with mild surrounding edema does not appear significantly changed. There is no evidence of new intracranial hemorrhage. Patchy hypoattenuation in the pons is unchanged from 06/29/2013, compatible with chronic ischemic change. Old lacunar infarcts are noted in the thalami. Moderate periventricular white matter chronic small vessel ischemic disease is unchanged. Remote infarct is also noted adjacent to the left frontal MRI. No abnormal enhancement is identified. Ventricles are mildly prominent for age, unchanged. There is no midline shift or extra-axial fluid collection. Orbits are unremarkable. Mild right frontal sinus mucosal thickening is noted. There is a small amount of right maxillary sinus fluid.  The visualized distal vertebral arteries are patent with the left being dominant. PICA origins are patent. AICA and SCA origins are patent. Basilar artery is patent without stenosis. There is a fetal origin of the left PCA with a hypoplastic left P1 segment. Patent right posterior communicating artery is not identified. There is mild bilateral PCA branch vessel irregularity without significant proximal stenosis.  Internal carotid arteries are patent from skull base to carotid termini. There is mild ACA and MCA branch vessel irregularity bilaterally without evidence of significant proximal stenosis. No intracranial aneurysm is identified.  Review of the MIP images confirms the above findings.  IMPRESSION: 1. No intracranial aneurysm identified. 2. Mild anterior and posterior circulation branches vessel irregularity, suggestive of mild intracranial atherosclerosis. No evidence of significant proximal stenosis. 3.  Unchanged, acute left temporal lobe parenchymal hemorrhage.   Electronically Signed   By: Sebastian Ache   On: 03/30/2014 11:48   Mr Brain Wo Contrast  03/31/2014   CLINICAL DATA:  Intracranial hemorrhage. End-stage renal disease, non compliant. Blood pressure elevated on initial presentation.  EXAM: MRI HEAD WITHOUT CONTRAST  TECHNIQUE: Multiplanar, multiecho pulse sequences of the brain and surrounding structures were obtained without intravenous contrast.  COMPARISON:  CT head 03/29/2014.  CTA 03/30/2014.  FINDINGS: Multifocal subcentimeter areas of acute infarction affect the bilateral frontal, parietal, temporal, and occipital regions, as well as the right cerebellum. Their distribution, in the junction zones between the ACA/ MCA, and MCA/PCA vascular territories, suggests a watershed type event, possible arrhythmia or hypotensive episode. No features suggestive of PRES.  Re-demonstrated is a roughly spherical 18 mm acute hemorrhage located in the left temporal lobe with moderate surrounding edema consistent with an acute intracerebral hematoma. There is no midline shift or uncal herniation. No features suggestive of hemorrhagic metastasis. No abnormalities to suggest of vascular lesion such as aneurysm or AVM. There is an old chronic hemorrhage involving the genu of the corpus callosum. Too numerous to count microbleeds, seen best on gradient sequence throughout the cerebral hemispheres, brainstem,  and cerebellum suggests that this lesion represents a complication of longstanding hypertensive cerebrovascular disease. Cerebral amyloid angiopathy is not excluded, but less favored.  Premature for age cerebral and cerebellar atrophy. There is extensive T2 and FLAIR hyperintensities throughout the periventricular and subcortical white matter, also involving the brainstem, consistent with chronic microvascular ischemic change. Flow voids are maintained in the major intracranial vessels. Large remote lacunar  infarcts are seen in the brainstem, thalamus, and subcortical white matter.  Partial empty sella. No tonsillar herniation. No worrisome osseous lesions. Marrow changes consistent with end-stage renal disease with anemia. Negative orbits. No acute sinus or mastoid disease.  IMPRESSION: Findings most consistent with an acute 1.8 cm left temporal lobe intracerebral hematoma as a complication of hypertensive cerebrovascular disease. Widespread microbleeds, chronic corpus callosum hemorrhagic infarction, multiple lacunes, and microvascular ischemic change throughout the white matter are also noted. Lobar hemorrhage from cerebral amyloid angiopathy is not excluded but less favored.  Multiple subcentimeter acute infarcts throughout the supratentorial region also affecting the right cerebellum; watershed type event is favored; correlate clinically with arrhythmia or hypotensive episode.   Electronically Signed   By: Davonna BellingJohn  Curnes M.D.   On: 03/31/2014 12:29   Dg Chest Port 1 View  03/31/2014   CLINICAL DATA:  Evaluate endotracheal tube position.  EXAM: PORTABLE CHEST - 1 VIEW  COMPARISON:  DG CHEST 1V PORT dated 03/30/2014; DG CHEST 1V PORT dated 03/30/2014; DG CHEST 1V PORT dated 03/29/2014; DG CHEST 1V PORT dated 03/08/2014  FINDINGS: Cardiopericardial silhouette is enlarged. Endotracheal tube tip is 48 mm from the carina. Right IJ vas cath present, unchanged. Enteric tube is present with the tip not visualized. Diffuse bilateral basilar predominant airspace disease, most compatible with pulmonary edema. No large pleural effusions are identified. Monitoring leads project over the chest.  IMPRESSION: 1. Support apparatus in good position, detailed above. 2. Slight worsening pulmonary aeration with lower volumes and more prominent basilar opacity, most consistent with pulmonary edema based on symmetry.   Electronically Signed   By: Andreas NewportGeoffrey  Lamke M.D.   On: 03/31/2014 07:57   Dg Chest Port 1 View  03/30/2014   CLINICAL  DATA:  Placement of endotracheal tube, central line, and NG tube  EXAM: PORTABLE CHEST - 1 VIEW  COMPARISON:  DG CHEST 1V PORT dated 03/30/2014; DG CHEST 1V PORT dated 03/29/2014; DG CHEST 1V PORT dated 03/08/2014  FINDINGS: Endotracheal tube is identified with tip 3.5 cm above the carinal. There is a right internal jugular central line with tip projecting over the right mainstem bronchus. An NG tube is identified crossing the gastroesophageal junction, with its tip not identified.  Mild cardiac enlargement. Hazy right lower lobe and left lower lobe opacities.  IMPRESSION: 1. Stable right lower lung zone and left lower lung zone infiltrate with improved aeration in the left upper lobe when compared to prior study. 2. Lines and tubes as described. Position of right internal jugular central line tip indeterminate. It projects over an area that could be associated with the superior vena cava. Distinction between potential arterial or venous location cannot be made on this single projection radiograph. Correlate clinically.   Electronically Signed   By: Esperanza Heiraymond  Rubner M.D.   On: 03/30/2014 19:40     Medications:   . dialysis replacement fluid (prismasate) 200 mL/hr at 03/31/14 1225  . dialysis replacement fluid (prismasate) 300 mL/hr at 03/31/14 1225  . dialysate (PRISMASATE) 2,000 mL/hr at 04/01/14 0658  . propofol Stopped (04/01/14 0723)   . antiseptic oral  rinse  15 mL Mouth Rinse QID  . carvedilol  25 mg Per Tube BID WC  . chlorhexidine  15 mL Mouth Rinse BID  . darbepoetin (ARANESP) injection - DIALYSIS  100 mcg Intravenous Q Wed-HD  . doxercalciferol  4 mcg Intravenous Q M,W,F-HD  . ferric gluconate (FERRLECIT/NULECIT) IV  125 mg Intravenous Q M,W,F-HD  . hydrALAZINE  50 mg Per Tube TID  . insulin aspart  0-15 Units Subcutaneous 6 times per day  . levETIRAcetam  500 mg Intravenous Q12H  . levofloxacin (LEVAQUIN) IV  250 mg Intravenous Q24H  . pantoprazole sodium  40 mg Per Tube Daily   sodium  chloride, sodium chloride, sodium chloride, feeding supplement (NEPRO CARB STEADY), fentaNYL, heparin, heparin, lidocaine (PF), lidocaine-prilocaine, pentafluoroprop-tetrafluoroeth, sodium chloride  Assessment/ Plan:  focus of parenchymal hemorrhage at the junction of the left temporal and left parietal lobes which measures 1.6 x 1.6 cm.Mild surrounding edema but no mass effect or IV extension.  1. ESRD (non-compliant with dialysis) will place patient on CVVHDF  2. Electrolytes Hyperkalemia resolved 3. Metabolic acidosis resolved 4. Anemia Will follow consider transfusion if less than 8  5. Bone mineral Will assess calcium 7.5 and phosphorus 5.1  Albumin 1.8 6. HTN Improved control   Plan CRRT for several days prior to transitioning to intermittent dialysis     LOS: 3 Matthew Beard @TODAY @10 :35 AM

## 2014-04-01 NOTE — Progress Notes (Signed)
SUBJECTIVE:[name] was seen and examined at bedside this morning. BP improved since admission. Will start CRRT today. Patient non-responsive with no sedation.   Filed Vitals:   04/01/14 0530 04/01/14 0545 04/01/14 0732 04/01/14 0855  BP: 134/89 136/91 128/82   Pulse: 71 71 71   Temp:    98.3 F (36.8 C)  TempSrc:    Oral  Resp: 20 17 19    Height:      Weight:      SpO2: 100% 100% 100%     Intake/Output Summary (Last 24 hours) at 04/01/14 1056 Last data filed at 04/01/14 0900  Gross per 24 hour  Intake 913.57 ml  Output   1062 ml  Net -148.43 ml    LABS: Basic Metabolic Panel:  Recent Labs  16/10/96 0430 03/31/14 1500 04/01/14 0400  NA 143 140 141  140  K 5.6* 4.4 4.3  4.3  CL 103 102 105  104  CO2 19 21 21  21   GLUCOSE 70 92 77  76  BUN 66* 63* 46*  45*  CREATININE 8.49* 7.57* 5.61*  5.67*  CALCIUM 8.1* 7.9* 7.5*  7.5*  MG 2.3  --  2.3  PHOS 6.5* 6.6* 5.1*  5.1*   Liver Function Tests:  Recent Labs  03/29/14 2255 03/31/14 1500 04/01/14 0400  AST 20  --   --   ALT 19  --   --   ALKPHOS 80  --   --   BILITOT 0.5  --   --   PROT 7.8  --   --   ALBUMIN 2.9* 2.2* 1.8*   CBC:  Recent Labs  03/31/14 0430 04/01/14 0400  WBC 6.6 5.3  NEUTROABS 4.9 3.8  HGB 8.1* 8.9*  HCT 25.2* 26.8*  MCV 89.7 89.0  PLT 221 194   Cardiac Enzymes:  Recent Labs  03/30/14 1555 03/30/14 2330 03/31/14 0430  CKTOTAL  --   --  75  CKMB  --   --  2.1  TROPONINI 0.51* 0.80* 0.71*   Fasting Lipid Panel:  Recent Labs  03/30/14 0637  TRIG 135   RADIOLOGY: Ct Angio Head W/cm &/or Wo Cm  03/30/2014   CLINICAL DATA:  Intracranial hemorrhage.  Rule out aneurysm.  EXAM: CT ANGIOGRAPHY HEAD  TECHNIQUE: Multidetector CT imaging of the head was performed using the standard protocol during bolus administration of intravenous contrast. Multiplanar CT image reconstructions and MIPs were obtained to evaluate the vascular anatomy.  CONTRAST:  50mL OMNIPAQUE  IOHEXOL 350 MG/ML SOLN  COMPARISON:  Head CT 03/29/2014  FINDINGS: 2.0 x 1.6 cm acute parenchymal hemorrhage in the posterior left temporal lobe with mild surrounding edema does not appear significantly changed. There is no evidence of new intracranial hemorrhage. Patchy hypoattenuation in the pons is unchanged from 06/29/2013, compatible with chronic ischemic change. Old lacunar infarcts are noted in the thalami. Moderate periventricular white matter chronic small vessel ischemic disease is unchanged. Remote infarct is also noted adjacent to the left frontal MRI. No abnormal enhancement is identified. Ventricles are mildly prominent for age, unchanged. There is no midline shift or extra-axial fluid collection. Orbits are unremarkable. Mild right frontal sinus mucosal thickening is noted. There is a small amount of right maxillary sinus fluid.  The visualized distal vertebral arteries are patent with the left being dominant. PICA origins are patent. AICA and SCA origins are patent. Basilar artery is patent without stenosis. There is a fetal origin of the left PCA with a hypoplastic  left P1 segment. Patent right posterior communicating artery is not identified. There is mild bilateral PCA branch vessel irregularity without significant proximal stenosis.  Internal carotid arteries are patent from skull base to carotid termini. There is mild ACA and MCA branch vessel irregularity bilaterally without evidence of significant proximal stenosis. No intracranial aneurysm is identified.  Review of the MIP images confirms the above findings.  IMPRESSION: 1. No intracranial aneurysm identified. 2. Mild anterior and posterior circulation branches vessel irregularity, suggestive of mild intracranial atherosclerosis. No evidence of significant proximal stenosis. 3. Unchanged, acute left temporal lobe parenchymal hemorrhage.   Electronically Signed   By: Sebastian AcheAllen  Grady   On: 03/30/2014 11:48   Ct Head Wo Contrast  03/29/2014    CLINICAL DATA:  Confusion  EXAM: CT HEAD WITHOUT CONTRAST  TECHNIQUE: Contiguous axial images were obtained from the base of the skull through the vertex without intravenous contrast.  COMPARISON:  None.  FINDINGS: The examination is significantly limited by patient motion artifact. Mild atrophic changes and chronic white matter ischemic changes are seen. There is a focus of parenchymal hemorrhage at the junction of the left temporal and left parietal lobes which measures 1.6 x 1.6 cm. No other focal abnormality is noted.  IMPRESSION: Significantly limited exam by patient motion artifact.  A focus of hemorrhage is noted as described above at the junction of the left temporal and parietal lobes. Mild surrounding edema is noted.  Critical Value/emergent results were called by telephone at the time of interpretation on 03/29/2014 at 11:48 PM to the emergency room physician, who verbally acknowledged these results.   Electronically Signed   By: Alcide CleverMark  Lukens M.D.   On: 03/29/2014 23:48   Mr Brain Wo Contrast  03/31/2014   CLINICAL DATA:  Intracranial hemorrhage. End-stage renal disease, non compliant. Blood pressure elevated on initial presentation.  EXAM: MRI HEAD WITHOUT CONTRAST  TECHNIQUE: Multiplanar, multiecho pulse sequences of the brain and surrounding structures were obtained without intravenous contrast.  COMPARISON:  CT head 03/29/2014.  CTA 03/30/2014.  FINDINGS: Multifocal subcentimeter areas of acute infarction affect the bilateral frontal, parietal, temporal, and occipital regions, as well as the right cerebellum. Their distribution, in the junction zones between the ACA/ MCA, and MCA/PCA vascular territories, suggests a watershed type event, possible arrhythmia or hypotensive episode. No features suggestive of PRES.  Re-demonstrated is a roughly spherical 18 mm acute hemorrhage located in the left temporal lobe with moderate surrounding edema consistent with an acute intracerebral hematoma. There is no  midline shift or uncal herniation. No features suggestive of hemorrhagic metastasis. No abnormalities to suggest of vascular lesion such as aneurysm or AVM. There is an old chronic hemorrhage involving the genu of the corpus callosum. Too numerous to count microbleeds, seen best on gradient sequence throughout the cerebral hemispheres, brainstem, and cerebellum suggests that this lesion represents a complication of longstanding hypertensive cerebrovascular disease. Cerebral amyloid angiopathy is not excluded, but less favored.  Premature for age cerebral and cerebellar atrophy. There is extensive T2 and FLAIR hyperintensities throughout the periventricular and subcortical white matter, also involving the brainstem, consistent with chronic microvascular ischemic change. Flow voids are maintained in the major intracranial vessels. Large remote lacunar infarcts are seen in the brainstem, thalamus, and subcortical white matter.  Partial empty sella. No tonsillar herniation. No worrisome osseous lesions. Marrow changes consistent with end-stage renal disease with anemia. Negative orbits. No acute sinus or mastoid disease.  IMPRESSION: Findings most consistent with an acute 1.8 cm left temporal lobe  intracerebral hematoma as a complication of hypertensive cerebrovascular disease. Widespread microbleeds, chronic corpus callosum hemorrhagic infarction, multiple lacunes, and microvascular ischemic change throughout the white matter are also noted. Lobar hemorrhage from cerebral amyloid angiopathy is not excluded but less favored.  Multiple subcentimeter acute infarcts throughout the supratentorial region also affecting the right cerebellum; watershed type event is favored; correlate clinically with arrhythmia or hypotensive episode.   Electronically Signed   By: Davonna Belling M.D.   On: 03/31/2014 12:29   Dg Chest Port 1 View  03/31/2014   CLINICAL DATA:  Evaluate endotracheal tube position.  EXAM: PORTABLE CHEST - 1 VIEW   COMPARISON:  DG CHEST 1V PORT dated 03/30/2014; DG CHEST 1V PORT dated 03/30/2014; DG CHEST 1V PORT dated 03/29/2014; DG CHEST 1V PORT dated 03/08/2014  FINDINGS: Cardiopericardial silhouette is enlarged. Endotracheal tube tip is 48 mm from the carina. Right IJ vas cath present, unchanged. Enteric tube is present with the tip not visualized. Diffuse bilateral basilar predominant airspace disease, most compatible with pulmonary edema. No large pleural effusions are identified. Monitoring leads project over the chest.  IMPRESSION: 1. Support apparatus in good position, detailed above. 2. Slight worsening pulmonary aeration with lower volumes and more prominent basilar opacity, most consistent with pulmonary edema based on symmetry.   Electronically Signed   By: Andreas Newport M.D.   On: 03/31/2014 07:57   Dg Chest Port 1 View  03/30/2014   CLINICAL DATA:  Placement of endotracheal tube, central line, and NG tube  EXAM: PORTABLE CHEST - 1 VIEW  COMPARISON:  DG CHEST 1V PORT dated 03/30/2014; DG CHEST 1V PORT dated 03/29/2014; DG CHEST 1V PORT dated 03/08/2014  FINDINGS: Endotracheal tube is identified with tip 3.5 cm above the carinal. There is a right internal jugular central line with tip projecting over the right mainstem bronchus. An NG tube is identified crossing the gastroesophageal junction, with its tip not identified.  Mild cardiac enlargement. Hazy right lower lobe and left lower lobe opacities.  IMPRESSION: 1. Stable right lower lung zone and left lower lung zone infiltrate with improved aeration in the left upper lobe when compared to prior study. 2. Lines and tubes as described. Position of right internal jugular central line tip indeterminate. It projects over an area that could be associated with the superior vena cava. Distinction between potential arterial or venous location cannot be made on this single projection radiograph. Correlate clinically.   Electronically Signed   By: Esperanza Heir M.D.   On:  03/30/2014 19:40   Dg Chest Port 1 View  03/30/2014   CLINICAL DATA:  Status post intubation  EXAM: PORTABLE CHEST - 1 VIEW  COMPARISON:  03/29/2014  FINDINGS: Cardiac shadow remains enlarged. Endotracheal tube is now noted 4.6 cm above the carina. Increasing infiltrative changes are noted bilaterally but particularly in the left mid lung when compared with the prior study. No acute bony abnormality is seen.  IMPRESSION: Endotracheal tube as described.  Increasing bilateral infiltrative changes.   Electronically Signed   By: Alcide Clever M.D.   On: 03/30/2014 00:21   Dg Chest Port 1 View  03/29/2014   CLINICAL DATA:  Loss of consciousness.  Dialysis patient.  EXAM: PORTABLE CHEST - 1 VIEW  COMPARISON:  DG CHEST 1V PORT dated 03/08/2014  FINDINGS: Cardiac enlargement without vascular congestion or edema. No focal consolidation or airspace disease. No blunting of costophrenic angles. No pneumothorax. No significant change since prior study.  IMPRESSION: Cardiac enlargement.  No evidence  of active pulmonary disease.   Electronically Signed   By: Burman Nieves M.D.   On: 03/29/2014 22:56   Dg Chest Port 1 View  03/08/2014   CLINICAL DATA:  Chest pain  EXAM: PORTABLE CHEST - 1 VIEW  COMPARISON:  02/11/2014  FINDINGS: Cardiomegaly is noted. No acute infiltrate or pleural effusion. No pulmonary edema. Stable mild elevation of the right hemidiaphragm.  IMPRESSION: No active disease.  Cardiomegaly again noted.   Electronically Signed   By: Natasha Mead M.D.   On: 03/08/2014 13:12   PHYSICAL EXAM General: lying in bed, non-responsive, on mechanical ventilation HEENT: ~61mm dilated b/l pupils but reactive to light. R HD catheter Lungs: coarse breath sounds CV: RRR Abdomen: Soft, +bs Neurologic: blinks eyes but not responsive, not following comands Psych: Normal affect. 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  Variable EF's on prior echo's, but current echo of 03/2014 with EF 25-30%, severely reduced systolic function, severe hypokinesis, severe LVH, mod dilated RV, PA pressure peak . Does not appear to have had ischemic work up yet. CE peaked at 0.8 and trended down to 0.71 yesterday. EKG with t wave inversions in lateral leads that do not appear new.  -EKG  -likely will need ischemic evaluation in the future if surviving hospital admission, however poor prognosis at this time -primary team will consult palliative today and family discussions  Uncontrolled HTN--admitted for hypertensive emergency with ICH. Home regimen includes coreg 25mg  bid, clonidine 0.2mg  TID, hydralazine 100mg  TID, IMDUR 30mg  qd, lasix 80mg  bid, and metolazone 5mg  qd; however noted to have medication non-compliance.  BP improved since admission. -continue coreg 25mg  bid, hydralazine 50mg  TID -may need simplified regimen at time of discharge for possible improved compliance -CRRT to start today, apparently noted to have non-compliance with HD as outpatient   VDRF with ?aspiration--remains intubated on mechanical ventilation -PCCM managing, mental status limiting extubation -on Levaquin  ICH in setting of hypertensive emergency along with multiple subcentimeter acute infarcts through supratentorial region--watershed type event? EEG with generalized non-specific cerebral dysfunction.   -Neurology following -improved BP control since admission -carotid dopplers pending  ESRD on HD--noted to be non-compliant. -Renal following -will start CRRT today  Case discussed and patient seen with Dr. Shirlee Latch  Signed: Darden Palmer, MD PGY-2, Internal Medicine Resident Pager: [phone]  04/01/2014,11:09 AM  Patient seen with resident, agree with the above note.  He has had intracerebral hemorrhage along with likely watershed infarcts in the setting of uncontrolled HTN.  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  BP is now reasonably controlled.  His eyes are open but he is not following commands for me.    We will sign off at this point.  Please call us if he has neurologic recovery and we can be of further assistance.   Laurey Morale 04/01/2014 11:56 AM

## 2014-04-01 NOTE — Progress Notes (Signed)
Thank you for consulting the Palliative Medicine Team at [facility] to meet your patient's and family's needs.   The reason that you asked [place] to see your patient is for GOC and options.   We have scheduled your patient for a meeting: 5/15 1000  The Surrogate decision make is: Ethelene Browns - brother Contact information: [phone]  Other family members that need to be present: at Anthony's discretion.    Your patient is able/unable to participate: unable  Additional Info: Has a 28 yo son that may be present for meeting.   Yong Channel, NP Palliative Medicine Team Pager # [phone] (M-F 8a-5p) Team Phone # [phone] (Nights/Weekends)

## 2014-04-01 NOTE — Progress Notes (Addendum)
NUTRITION FOLLOW UP  Intervention:   - If patient not extubated and appropriate per GOC, recommend reinitiate TF per PEPuP Protocol as tolerated. Day 1 initiate Vital AF 1.2 at 25 mL/hr for the remainder of the day.  Day 2 start new goal rate of 1680 mL (70 mL/hr), provides 2016 kcal, 126 g protein, 1377 ml free water.    Nutrition Dx:   Inadequate oral intake related to inability to eat as evidenced by intubated, NPO, ongoing  Goal:   Patient will meet >/=90% of estimated nutrition needs, not meeting  Monitor:   TF tolerance, weight trends, labs, I/Os, vent status  Assessment:   Pt admitted with convulsions at home. Pt found to have small focal hemorrhage at junction of left parietal and temporal lobes. Pt was intubated due to agitation.  Pt has ESRD on HD at home; family reports he is not compliant.  Patient remains intubated on ventilator support. MV: 10.2 L/min Max temp: 37 Propofol stopped.   TF currently held due to high residuals (~500 ml). No plans to restart currently.   Palliative consult noted. GOC meeting scheduled 5/15. Currently on CVVD for several days prior to transitioning to intermittent dialysis.   Height: Ht Readings from Last 1 Encounters:  03/30/14 5\' 10"  (1.778 m)    Weight Status:   Wt Readings from Last 1 Encounters:  04/01/14 183 lb 10.3 oz (83.3 kg)    Re-estimated needs:  Kcal: 1965 kcal Protein: 110-130 g Fluid: per MD  Skin: 2+ edema  Diet Order: NPO   Intake/Output Summary (Last 24 hours) at 04/01/14 1458 Last data filed at 04/01/14 1400  Gross per 24 hour  Intake 819.94 ml  Output   1049 ml  Net -229.06 ml    Last BM: 5/13   Labs:   Recent Labs Lab 03/30/14 0637 03/31/14 0430 03/31/14 1500 04/01/14 0400  NA 137 143 140 141  140  K 5.4* 5.6* 4.4 4.3  4.3  CL 101 103 102 105  104  CO2 19 19 21 21  21   BUN 54* 66* 63* 46*  45*  CREATININE 7.43* 8.49* 7.57* 5.61*  5.67*  CALCIUM 8.3* 8.1* 7.9* 7.5*  7.5*   MG 2.2 2.3  --  2.3  PHOS 5.6* 6.5* 6.6* 5.1*  5.1*  GLUCOSE 93 70 92 77  76    CBG (last 3)   Recent Labs  04/01/14 0331 04/01/14 0758 04/01/14 1212  GLUCAP 81 72 86    Scheduled Meds: . antiseptic oral rinse  15 mL Mouth Rinse QID  . carvedilol  25 mg Per Tube BID WC  . chlorhexidine  15 mL Mouth Rinse BID  . darbepoetin (ARANESP) injection - DIALYSIS  100 mcg Intravenous Q Wed-HD  . doxercalciferol  4 mcg Intravenous Q M,W,F-HD  . ferric gluconate (FERRLECIT/NULECIT) IV  125 mg Intravenous Q M,W,F-HD  . hydrALAZINE  50 mg Per Tube TID  . insulin aspart  0-15 Units Subcutaneous 6 times per day  . levETIRAcetam  500 mg Intravenous Q12H  . levofloxacin (LEVAQUIN) IV  250 mg Intravenous Q24H  . pantoprazole sodium  40 mg Per Tube Daily    Continuous Infusions: . dialysis replacement fluid (prismasate) 200 mL/hr at 03/31/14 1225  . dialysis replacement fluid (prismasate) 300 mL/hr at 03/31/14 1225  . dialysate (PRISMASATE) 2,000 mL/hr at 04/01/14 0658  . propofol Stopped (04/01/14 0723)    Linnell Fulling, RD, LDN Pager #: [phone] After-Hours Pager #: [phone]

## 2014-04-01 NOTE — Progress Notes (Signed)
PULMONARY / CRITICAL CARE MEDICINE   Name: Matthew Beard MRN: [id number] DOB: 1976-02-27   PCP [name], MD   ADMISSION DATE:  03/29/2014 CONSULTATION DATE:  03/30/14  REFERRING MD :  EDP PRIMARY SERVICE: PCCM  CHIEF COMPLAINT:  Cerebral hemorrhage  BRIEF PATIENT DESCRIPTION:   . Mr. Eastridge is a 38 y.o. M with PMH significant for ESRD (non-compliant with dialysis), HTN, anemia, NICM, CHF, SOB, who presented to ED on 5/11 after he was at home and per ED notes, fell backwards and began to shake.  His fiances children witnessed the event and called her.  He was in his USOH prior to this episode although fiance notes that his health has not been very good of late.   In ED, initial BP was 209/142 and got as high as 249/144.  Head CT was obtained and revealed a small focal hemorrhage at junction of left parietal and temporal lobes. Pt was intubated due to agitation/attempting to pull at lines/confusion/concern for lack of ability to protect airway. Per pt's fiance, he is non-compliant with his home meds as well as dialysis and she believes that he was supposed to have dialysis today, however, missed it. Of note, pt was recently discharged from [facility] on 4/28 after he was admitted and treated for volume overload and found to have ESRD.   SIGNIFICANT EVENTS / STUDIES:  5/11 - fell backwards at home and began shaking, questionable loss of consciousness. 5/12 Head CT >>> small hemorrhage at junction of left temporal and parietal lobes.  Intubated by EDP for airway protection.  LINES / TUBES: OETT 5/12 >>> OGT 5/12 >>> Foley 5/12 >>> Rt IJ Trialysis cath 03/30/14 >>  CULTURES: Sputum 5/12 >>>   ANTIBIOTICS: Vanc 5/11 >>5/11 Maxipime 5/11 >>>5/12 Imipenem  5/12 >>5/13 Levaquin 5/13 (aspn) >>     EVENTS   03/30/14: Dr Pearlean Brownie recommends SBP 120-160 as goal. Patient very hypertensive and agitated. CT angio head shows no IC aneurysm but has acute left temporal lobe hge. : Remains  hypertensive on propofol and cardene.  Intubated by EDP.    03/31/14: MRI with 1.8cm acute left temporal lobe bleed with multiple micro infarcts and bleed. On diprivan: RASS -3 but doing sbt.   Off cardene. On serveral home bp meds - sbp 111 . ECHO shows sudden drop in EF from 50% in march 2015 tor 25% now. Global hypokinesis    SUBJECTIVE/OVERNIGHT/INTERVAL HX  04/01/14: Cards reckons hypertensive "blown out" cardiomyopathy. On CVVH ongoing. Off sedation: not awake enough for extubation though doing SBT  VITAL SIGNS: Temp:  [94.3 F (34.6 C)-98.6 F (37 C)] 98.3 F (36.8 C) (05/14 0855) Pulse Rate:  [32-128] 71 (05/14 0732) Resp:  [12-29] 19 (05/14 0732) BP: (100-171)/(61-109) 128/82 mmHg (05/14 0732) SpO2:  [77 %-100 %] 100 % (05/14 0732) FiO2 (%):  [40 %] 40 % (05/14 0732) Weight:  [83.3 kg (183 lb 10.3 oz)] 83.3 kg (183 lb 10.3 oz) (05/14 0437) HEMODYNAMICS:   VENTILATOR SETTINGS: Vent Mode:  [-] CPAP;PSV FiO2 (%):  [40 %] 40 % Set Rate:  [14 bmp] 14 bmp Vt Set:  [580 mL] 580 mL PEEP:  [5 cmH20] 5 cmH20 Pressure Support:  [10 cmH20-15 cmH20] 10 cmH20 Plateau Pressure:  [17 cmH20-21 cmH20] 21 cmH20 INTAKE / OUTPUT: Intake/Output     05/13 0701 - 05/14 0700 05/14 0701 - 05/15 0700   I.V. (mL/kg) 483.3 (5.8)    NG/GT 120    IV Piggyback 365    Total  Intake(mL/kg) 968.3 (11.6)    Urine (mL/kg/hr) 370 (0.2)    Emesis/NG output 140 (0.1)    Other 536 (0.3) 32 (0.1)   Total Output 1046 32   Net -77.7 -32        Stool Occurrence 1 x      PHYSICAL EXAMINATION: General: Young AA male, , hypertensive. Neuro:  Intubated/sedated off propofol, RASS -2, blinks and not tracking, not following commands HEENT: Echo/AT. PERRL, sclerae anicteric. Cardiovascular: tachy but regular, no M/R/G.  Lungs: Respirations even and unlabored on full vent support.  CTA bilaterally, No W/R/R.  Abdomen: BS x 4, soft, NT/ND.  Musculoskeletal: No gross deformities, 2+ edema to LE's bilaterally.   Fistula noted LUE. Skin: Intact, warm, no rashes.    LABS: PULMONARY  Recent Labs Lab 03/29/14 2307 03/30/14 0122 03/30/14 0422 03/30/14 0559  PHART 7.463* 7.424 7.431 7.429  PCO2ART 28.4* 33.9* 31.4* 31.8*  PO2ART 69.0* 102.0* 46.6* 329.0*  HCO3 20.0 22.0 20.5 20.7  TCO2 21 23 21.5 21.7  O2SAT 93.0 98.0 77.2 100.0    CBC  Recent Labs Lab 03/30/14 0637 03/31/14 0430 04/01/14 0400  HGB 8.0* 8.1* 8.9*  HCT 24.7* 25.2* 26.8*  WBC 8.5 6.6 5.3  PLT 212 221 194    COAGULATION  Recent Labs Lab 03/30/14 0052  INR 1.30    CARDIAC    Recent Labs Lab 03/30/14 0637 03/30/14 1035 03/30/14 1555 03/30/14 2330 03/31/14 0430  TROPONINI 0.45* 0.56* 0.51* 0.80* 0.71*   No results found for this basename: PROBNP,  in the last 168 hours   CHEMISTRY  Recent Labs Lab 03/29/14 2255 03/30/14 0637 03/31/14 0430 03/31/14 1500 04/01/14 0400  NA 138 137 143 140 141  140  K 5.6* 5.4* 5.6* 4.4 4.3  4.3  CL 99 101 103 102 105  104  CO2 20 19 19 21 21  21   GLUCOSE 94 93 70 92 77  76  BUN 48* 54* 66* 63* 46*  45*  CREATININE 7.21* 7.43* 8.49* 7.57* 5.61*  5.67*  CALCIUM 8.4 8.3* 8.1* 7.9* 7.5*  7.5*  MG  --  2.2 2.3  --  2.3  PHOS  --  5.6* 6.5* 6.6* 5.1*  5.1*   Estimated Creatinine Clearance: 18.3 ml/min (by C-G formula based on Cr of 5.61).   LIVER  Recent Labs Lab 03/29/14 2255 03/30/14 0052 03/31/14 1500 04/01/14 0400  AST 20  --   --   --   ALT 19  --   --   --   ALKPHOS 80  --   --   --   BILITOT 0.5  --   --   --   PROT 7.8  --   --   --   ALBUMIN 2.9*  --  2.2* 1.8*  INR  --  1.30  --   --      INFECTIOUS  Recent Labs Lab 03/29/14 2314 03/31/14 0450  LATICACIDVEN 2.71* 0.8     ENDOCRINE CBG (last 3)   Recent Labs  04/01/14 0041 04/01/14 0331 04/01/14 0758  GLUCAP 80 81 72         IMAGING x48h  Ct Angio Head W/cm &/or Wo Cm  03/30/2014   CLINICAL DATA:  Intracranial hemorrhage.  Rule out aneurysm.  EXAM: CT  ANGIOGRAPHY HEAD  TECHNIQUE: Multidetector CT imaging of the head was performed using the standard protocol during bolus administration of intravenous contrast. Multiplanar CT image reconstructions and MIPs were obtained to evaluate the vascular  anatomy.  CONTRAST:  50mL OMNIPAQUE IOHEXOL 350 MG/ML SOLN  COMPARISON:  Head CT 03/29/2014  FINDINGS: 2.0 x 1.6 cm acute parenchymal hemorrhage in the posterior left temporal lobe with mild surrounding edema does not appear significantly changed. There is no evidence of new intracranial hemorrhage. Patchy hypoattenuation in the pons is unchanged from 06/29/2013, compatible with chronic ischemic change. Old lacunar infarcts are noted in the thalami. Moderate periventricular white matter chronic small vessel ischemic disease is unchanged. Remote infarct is also noted adjacent to the left frontal MRI. No abnormal enhancement is identified. Ventricles are mildly prominent for age, unchanged. There is no midline shift or extra-axial fluid collection. Orbits are unremarkable. Mild right frontal sinus mucosal thickening is noted. There is a small amount of right maxillary sinus fluid.  The visualized distal vertebral arteries are patent with the left being dominant. PICA origins are patent. AICA and SCA origins are patent. Basilar artery is patent without stenosis. There is a fetal origin of the left PCA with a hypoplastic left P1 segment. Patent right posterior communicating artery is not identified. There is mild bilateral PCA branch vessel irregularity without significant proximal stenosis.  Internal carotid arteries are patent from skull base to carotid termini. There is mild ACA and MCA branch vessel irregularity bilaterally without evidence of significant proximal stenosis. No intracranial aneurysm is identified.  Review of the MIP images confirms the above findings.  IMPRESSION: 1. No intracranial aneurysm identified. 2. Mild anterior and posterior circulation branches  vessel irregularity, suggestive of mild intracranial atherosclerosis. No evidence of significant proximal stenosis. 3. Unchanged, acute left temporal lobe parenchymal hemorrhage.   Electronically Signed   By: Sebastian Ache   On: 03/30/2014 11:48   Mr Brain Wo Contrast  03/31/2014   CLINICAL DATA:  Intracranial hemorrhage. End-stage renal disease, non compliant. Blood pressure elevated on initial presentation.  EXAM: MRI HEAD WITHOUT CONTRAST  TECHNIQUE: Multiplanar, multiecho pulse sequences of the brain and surrounding structures were obtained without intravenous contrast.  COMPARISON:  CT head 03/29/2014.  CTA 03/30/2014.  FINDINGS: Multifocal subcentimeter areas of acute infarction affect the bilateral frontal, parietal, temporal, and occipital regions, as well as the right cerebellum. Their distribution, in the junction zones between the ACA/ MCA, and MCA/PCA vascular territories, suggests a watershed type event, possible arrhythmia or hypotensive episode. No features suggestive of PRES.  Re-demonstrated is a roughly spherical 18 mm acute hemorrhage located in the left temporal lobe with moderate surrounding edema consistent with an acute intracerebral hematoma. There is no midline shift or uncal herniation. No features suggestive of hemorrhagic metastasis. No abnormalities to suggest of vascular lesion such as aneurysm or AVM. There is an old chronic hemorrhage involving the genu of the corpus callosum. Too numerous to count microbleeds, seen best on gradient sequence throughout the cerebral hemispheres, brainstem, and cerebellum suggests that this lesion represents a complication of longstanding hypertensive cerebrovascular disease. Cerebral amyloid angiopathy is not excluded, but less favored.  Premature for age cerebral and cerebellar atrophy. There is extensive T2 and FLAIR hyperintensities throughout the periventricular and subcortical white matter, also involving the brainstem, consistent with chronic  microvascular ischemic change. Flow voids are maintained in the major intracranial vessels. Large remote lacunar infarcts are seen in the brainstem, thalamus, and subcortical white matter.  Partial empty sella. No tonsillar herniation. No worrisome osseous lesions. Marrow changes consistent with end-stage renal disease with anemia. Negative orbits. No acute sinus or mastoid disease.  IMPRESSION: Findings most consistent with an acute 1.8 cm left  temporal lobe intracerebral hematoma as a complication of hypertensive cerebrovascular disease. Widespread microbleeds, chronic corpus callosum hemorrhagic infarction, multiple lacunes, and microvascular ischemic change throughout the white matter are also noted. Lobar hemorrhage from cerebral amyloid angiopathy is not excluded but less favored.  Multiple subcentimeter acute infarcts throughout the supratentorial region also affecting the right cerebellum; watershed type event is favored; correlate clinically with arrhythmia or hypotensive episode.   Electronically Signed   By: Davonna Belling M.D.   On: 03/31/2014 12:29   Dg Chest Port 1 View  03/31/2014   CLINICAL DATA:  Evaluate endotracheal tube position.  EXAM: PORTABLE CHEST - 1 VIEW  COMPARISON:  DG CHEST 1V PORT dated 03/30/2014; DG CHEST 1V PORT dated 03/30/2014; DG CHEST 1V PORT dated 03/29/2014; DG CHEST 1V PORT dated 03/08/2014  FINDINGS: Cardiopericardial silhouette is enlarged. Endotracheal tube tip is 48 mm from the carina. Right IJ vas cath present, unchanged. Enteric tube is present with the tip not visualized. Diffuse bilateral basilar predominant airspace disease, most compatible with pulmonary edema. No large pleural effusions are identified. Monitoring leads project over the chest.  IMPRESSION: 1. Support apparatus in good position, detailed above. 2. Slight worsening pulmonary aeration with lower volumes and more prominent basilar opacity, most consistent with pulmonary edema based on symmetry.    Electronically Signed   By: Andreas Newport M.D.   On: 03/31/2014 07:57   Dg Chest Port 1 View  03/30/2014   CLINICAL DATA:  Placement of endotracheal tube, central line, and NG tube  EXAM: PORTABLE CHEST - 1 VIEW  COMPARISON:  DG CHEST 1V PORT dated 03/30/2014; DG CHEST 1V PORT dated 03/29/2014; DG CHEST 1V PORT dated 03/08/2014  FINDINGS: Endotracheal tube is identified with tip 3.5 cm above the carinal. There is a right internal jugular central line with tip projecting over the right mainstem bronchus. An NG tube is identified crossing the gastroesophageal junction, with its tip not identified.  Mild cardiac enlargement. Hazy right lower lobe and left lower lobe opacities.  IMPRESSION: 1. Stable right lower lung zone and left lower lung zone infiltrate with improved aeration in the left upper lobe when compared to prior study. 2. Lines and tubes as described. Position of right internal jugular central line tip indeterminate. It projects over an area that could be associated with the superior vena cava. Distinction between potential arterial or venous location cannot be made on this single projection radiograph. Correlate clinically.   Electronically Signed   By: Esperanza Heir M.D.   On: 03/30/2014 19:40      ASSESSMENT / PLAN:  NEUROLOGIC A:  ICH - likely secondary to significantly elevated BP in the setting of hypertensive emergency. Questionable seizure. Urine tox negative.   - intermittently agitated. MRI findings noted. Not ready for extubation. Lactate and CK  normal on diprivan  P:   - Neurosurgery and neurology consult appreciated - Keppra. - Propofol gtt / Fentanyl PRN for sedation.; track lactic and ck - recheck 04/02/14 - WUA again in PM  CARDIOVASCULAR A:  Hypertensive Emergency - BP up to as high as 249/144 (MAP 164) Hx of non-ischemic cardiomyopathy due to hypertensive heart disease     - new drop in EF to 25%. BP now below goal of 120-160 sbp  P:  - SBP goal 120-160 per  neuro - cards mgmt appreciated   PULMONARY A: VDRF - in the setting of ICH. Concern for aspiration.   - on 40% fio2. Doing SBT but mental status might preclude  extubation P:   - SBT and do WUA at 13.20h. If does well, can extubate. Otherwise, full vent support - VAP bundle. - Empiric abx  .  RENAL A:  ESRD   - renal doing cvvh in setting of IC Hge and VDRF   P:   - Nephrology consult.   GASTROINTESTINAL A:   NPO P:   - SUP: Pantoprazole. - TF start  HEMATOLOGIC A:   No acute issues. P:  - VTE prophylaxis:  SCD's only. - CBC in AM.  INFECTIOUS A:   Concern for aspiration. P:   -dc imipenem due to seizure hx - RXt levaquin (total 7d abx) - Monitor fever curve / WBC's.  ENDOCRINE A:   No acute issues. P:   - CBG's q4. - SSI.  03/30/14: Updated brothers and sisters at bedside  03/31/14: d/w Dr Hyman HopesWebb of renal and Dr Pearlean BrownieSethi of neuro at bedside. No family today  04/01/14: Not awake enough for extubation. Prognosis very poor. D/w Ethelene BrownsAnthony his brother: he has agreed for palliative care goals of care meeting  The patient is critically ill with multiple organ systems failure and requires high complexity decision making for assessment and support, frequent evaluation and titration of therapies, application of advanced monitoring technologies and extensive interpretation of multiple databases.   Critical Care Time devoted to patient care services described in this note is  35 additional  Minutes.  Dr. Kalman ShanMurali Hani Campusano, M.D., [facility] LL[facility].C.C.P Pulmonary and Critical Care Medicine Staff Physician [facility] Pager: [phone], If no answer or between  15:00h - 7:00h: call [phone]  04/01/2014 11:18 AM

## 2014-04-01 NOTE — Progress Notes (Signed)
Stroke Team Progress Note  HISTORY Matthew Beard is a 38 y.o. 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  Patient was home with his children when suddenly collapsed to the ground and had a generalized convulsion. EMS called and patient brought to the ED where He was confused with SBP 249.  Urgent CT brain revealed a focus of parenchymal hemorrhage at the junction of the left temporal and left parietal lobes which measures 1.6 x 1.6 cm.Mild surrounding edema but no mass effect or IV extension.  PTT 37. INR 1.30. Platelets 198  Patient was not protecting his airway and was intubated in the ED.  Loaded with 1 gram IV keppra. Nicardipine infusion started.   Date last known well: 03/29/14  Time last known well: unclear  tPA Given: no, ICH   SUBJECTIVE No family present. The patient is intubated and today off propofol since am. He withdraws to pain.  OBJECTIVE Most recent Vital Signs: Filed Vitals:   04/01/14 0530 04/01/14 0545 04/01/14 0732 04/01/14 0855  BP: 134/89 136/91 128/82   Pulse: 71 71 71   Temp:    98.3 F (36.8 C)  TempSrc:    Oral  Resp: 20 17 19    Height:      Weight:      SpO2: 100% 100% 100%    CBG (last 3)   Recent Labs  04/01/14 0041 04/01/14 0331 04/01/14 0758  GLUCAP 80 81 72    IV Fluid Intake:   . dialysis replacement fluid (prismasate) 200 mL/hr at 03/31/14 1225  . dialysis replacement fluid (prismasate) 300 mL/hr at 03/31/14 1225  . dialysate (PRISMASATE) 2,000 mL/hr at 04/01/14 0658  . propofol Stopped (04/01/14 0723)    MEDICATIONS  . antiseptic oral rinse  15 mL Mouth Rinse QID  . carvedilol  25 mg Per Tube BID WC  . chlorhexidine  15 mL Mouth Rinse BID  . darbepoetin (ARANESP) injection - DIALYSIS  100 mcg Intravenous Q Wed-HD  .  doxercalciferol  4 mcg Intravenous Q M,W,F-HD  . ferric gluconate (FERRLECIT/NULECIT) IV  125 mg Intravenous Q M,W,F-HD  . hydrALAZINE  50 mg Per Tube TID  . insulin aspart  0-15 Units Subcutaneous 6 times per day  . levETIRAcetam  500 mg Intravenous Q12H  . levofloxacin (LEVAQUIN) IV  250 mg Intravenous Q24H  . pantoprazole sodium  40 mg Per Tube Daily   PRN:  sodium chloride, sodium chloride, sodium chloride, feeding supplement (NEPRO CARB STEADY), fentaNYL, heparin, heparin, lidocaine (PF), lidocaine-prilocaine, pentafluoroprop-tetrafluoroeth, sodium chloride  Diet:  NPO no liquids Activity:  Bedrest DVT Prophylaxis:  SCDs  CLINICALLY SIGNIFICANT STUDIES Basic Metabolic Panel:   Recent Labs Lab 03/31/14 0430 03/31/14 1500 04/01/14 0400  NA 143 140 141  140  K 5.6* 4.4 4.3  4.3  CL 103 102 105  104  CO2 19 21 21  21   GLUCOSE 70 92 77  76  BUN 66* 63* 46*  45*  CREATININE 8.49* 7.57* 5.61*  5.67*  CALCIUM 8.1* 7.9* 7.5*  7.5*  MG 2.3  --  2.3  PHOS 6.5* 6.6* 5.1*  5.1*   Liver Function Tests:   Recent Labs Lab 03/29/14 2255 03/31/14 1500 04/01/14 0400  AST 20  --   --   ALT 19  --   --  ALKPHOS 80  --   --   BILITOT 0.5  --   --   PROT 7.8  --   --   ALBUMIN 2.9* 2.2* 1.8*   CBC:   Recent Labs Lab 03/31/14 0430 04/01/14 0400  WBC 6.6 5.3  NEUTROABS 4.9 3.8  HGB 8.1* 8.9*  HCT 25.2* 26.8*  MCV 89.7 89.0  PLT 221 194   Coagulation:   Recent Labs Lab 03/30/14 0052  LABPROT 15.9*  INR 1.30   Cardiac Enzymes:   Recent Labs Lab 03/30/14 1555 03/30/14 2330 03/31/14 0430  CKTOTAL  --   --  75  CKMB  --   --  2.1  TROPONINI 0.51* 0.80* 0.71*   Urinalysis:   Recent Labs Lab 03/29/14 2251  COLORURINE YELLOW  LABSPEC 1.019  PHURINE 6.5  GLUCOSEU NEGATIVE  HGBUR LARGE*  BILIRUBINUR NEGATIVE  KETONESUR NEGATIVE  PROTEINUR >300*  UROBILINOGEN 1.0  NITRITE NEGATIVE  LEUKOCYTESUR NEGATIVE   Lipid Panel    Component Value  Date/Time   CHOL 187 01/22/2012 0515   TRIG 135 03/30/2014 0637   HDL 38* 01/22/2012 0515   CHOLHDL 4.9 01/22/2012 0515   VLDL 25 01/22/2012 0515   LDLCALC 124* 01/22/2012 0515   HgbA1C  Lab Results  Component Value Date   HGBA1C 5.3 03/08/2014    Urine Drug Screen:     Component Value Date/Time   LABOPIA NONE DETECTED 03/29/2014 2251   LABOPIA NEGATIVE 03/08/2014 2108   COCAINSCRNUR NONE DETECTED 03/29/2014 2251   COCAINSCRNUR NEGATIVE 03/08/2014 2108   LABBENZ NONE DETECTED 03/29/2014 2251   LABBENZ NEGATIVE 03/08/2014 2108   AMPHETMU NONE DETECTED 03/29/2014 2251   AMPHETMU NEGATIVE 03/08/2014 2108   THCU NONE DETECTED 03/29/2014 2251   LABBARB NONE DETECTED 03/29/2014 2251    Alcohol Level: No results found for this basename: ETH,  in the last 168 hours  Ct Head Wo Contrast 03/29/2014    Significantly limited exam by patient motion artifact.  A focus of hemorrhage is noted as described above at the junction of the left temporal and parietal lobes. Mild surrounding edema is noted.      Dg Chest Port 1 View 03/30/2014 Endotracheal tube as described.  Increasing bilateral infiltrative changes.   Electronically Signed   By: Alcide Clever M.D.   On: 03/30/2014 00:21   Dg Chest Port 1 View 03/29/2014    Cardiac enlargement.  No evidence of active pulmonary disease.    CT Angio of head 03/30/2014 1. No intracranial aneurysm identified. 2. Mild anterior and posterior circulation branches vessel irregularity, suggestive of mild intracranial atherosclerosis. No evidence of significant proximal stenosis. 3. Unchanged, acute left temporal lobe parenchymal hemorrhage.       2D Echocardiogram  Left ventricle: The cavity size was normal. Wall thickness was increased in a pattern of severe LVH. Systolic function was severely reduced. The estimated ejection fraction was in the range of 25% to 30%. Severe hypokinesis of the entire myocardium.    Carotid Doppler  pending  CXR    EKG  Normal  sinus rhythm rate 96 beats per minute.  For complete results please see formal report.   Therapy Recommendations pending  Physical Exam   Mental status: off propofol   stuporose and barely arousable CN 2-12" pupils 3-4 mm bilaterally, reactive. No gaze preference.mild vertical skew with LE hypertropia No nystagmus. Corneal reflex present    Tongue: midline.intubated.  Motor: Mild withdrawal in all 4 extremities  Right greater than left to  noxious stimuli. Spontaneous left arm and leg movements noted. Sensory: mild reaction to pain but on propofol.  DTR's: unable to elicit, on propofol  Coordination and gait: unable to test.   ASSESSMENT [name] is a 38 y.o. male presenting with a witnessed seizure and collapse. TPA was not initiated secondary to hemorrhage. Head CT - a focus of hemorrhage is noted at the junction of the left temporal and parietal lobes.On no atherothrombotics prior to admission. Now on no atherothrombotics for secondary stroke prevention. Patient with resultant unresponsiveness. Stroke work up underway.   Hypertension  History of noncompliance with medications  ESRD - HD  Nonischemic cardiomyopathy  History of lacunar infarcts  Anemia   Hospital day # 3  TREATMENT/PLAN  No antithrombotics at this time for secondary stroke prevention.  Await EEG results  Await 2  carotid Dopplers  On Keppra   Keep systolic blood pressure less than 160  Minimize sedation and ventilator wean if possible  D/w Dr Marchelle Gearingamaswamy,    Check EEG for silent seizures and cardiology consult for low EF 25 %    This patient is critically ill and at significant risk of neurological worsening, death and care requires constant monitoring of vital signs, hemodynamics,respiratory and cardiac monitoring,review of multiple databases, neurological assessment, discussion with family, other specialists and medical decision making of high complexity. I spent 30 minutes of neurocritical  care time  in the care of  this patient.    Delia HeadyPramod Oluwasemilore Bahl, MD 04/01/2014, 11:06 AM     To contact Stroke Continuity provider, please refer to [email or web address]. After hours, contact General Neurology

## 2014-04-02 ENCOUNTER — Inpatient Hospital Stay (HOSPITAL_COMMUNITY): Payer: Medicaid Other

## 2014-04-02 DIAGNOSIS — Z515 Encounter for palliative care: Secondary | ICD-10-CM

## 2014-04-02 LAB — RENAL FUNCTION PANEL
ALBUMIN: 2.1 g/dL — AB (ref 3.5–5.2)
Albumin: 2.1 g/dL — ABNORMAL LOW (ref 3.5–5.2)
BUN: 26 mg/dL — ABNORMAL HIGH (ref 6–23)
BUN: 18 mg/dL (ref 6–23)
CALCIUM: 7.8 mg/dL — AB (ref 8.4–10.5)
CALCIUM: 7.9 mg/dL — AB (ref 8.4–10.5)
CHLORIDE: 102 meq/L (ref 96–112)
CO2: 23 mEq/L (ref 19–32)
CO2: 25 mEq/L (ref 19–32)
CREATININE: 2.86 mg/dL — AB (ref 0.50–1.35)
Chloride: 102 mEq/L (ref 96–112)
Creatinine, Ser: 3.64 mg/dL — ABNORMAL HIGH (ref 0.50–1.35)
GFR calc Af Amer: 30 mL/min — ABNORMAL LOW (ref >90)
GFR calc non Af Amer: 20 mL/min — ABNORMAL LOW (ref >90)
GFR calc non Af Amer: 26 mL/min — ABNORMAL LOW (ref >90)
GFR, EST AFRICAN AMERICAN: 23 mL/min — AB (ref >90)
GLUCOSE: 74 mg/dL (ref 70–99)
Glucose, Bld: 210 mg/dL — ABNORMAL HIGH (ref 70–99)
Phosphorus: 3.5 mg/dL (ref 2.3–4.6)
Phosphorus: 3 mg/dL (ref 2.3–4.6)
Potassium: 4.1 mEq/L (ref 3.7–5.3)
Potassium: 4.3 mEq/L (ref 3.7–5.3)
SODIUM: 137 meq/L (ref 137–147)
SODIUM: 139 meq/L (ref 137–147)

## 2014-04-02 LAB — GLUCOSE, CAPILLARY
GLUCOSE-CAPILLARY: 64 mg/dL — AB (ref 70–99)
GLUCOSE-CAPILLARY: 129 mg/dL — AB (ref 70–99)
GLUCOSE-CAPILLARY: 48 mg/dL — AB (ref 70–99)
GLUCOSE-CAPILLARY: 49 mg/dL — AB (ref 70–99)
GLUCOSE-CAPILLARY: 52 mg/dL — AB (ref 70–99)
GLUCOSE-CAPILLARY: 81 mg/dL (ref 70–99)
Glucose-Capillary: 117 mg/dL — ABNORMAL HIGH (ref 70–99)
Glucose-Capillary: 131 mg/dL — ABNORMAL HIGH (ref 70–99)
Glucose-Capillary: 46 mg/dL — ABNORMAL LOW (ref 70–99)
Glucose-Capillary: 58 mg/dL — ABNORMAL LOW (ref 70–99)
Glucose-Capillary: 66 mg/dL — ABNORMAL LOW (ref 70–99)
Glucose-Capillary: 79 mg/dL (ref 70–99)
Glucose-Capillary: 95 mg/dL (ref 70–99)

## 2014-04-02 LAB — CBC WITH DIFFERENTIAL/PLATELET
Basophils Absolute: 0 10*3/uL (ref 0.0–0.1)
Basophils Relative: 0 % (ref 0–1)
EOS ABS: 0.2 10*3/uL (ref 0.0–0.7)
Eosinophils Relative: 3 % (ref 0–5)
HCT: 25.1 % — ABNORMAL LOW (ref 39.0–52.0)
HEMOGLOBIN: 8 g/dL — AB (ref 13.0–17.0)
Lymphocytes Relative: 16 % (ref 12–46)
Lymphs Abs: 1.3 10*3/uL (ref 0.7–4.0)
MCH: 28.9 pg (ref 26.0–34.0)
MCHC: 31.9 g/dL (ref 30.0–36.0)
MCV: 90.6 fL (ref 78.0–100.0)
MONOS PCT: 11 % (ref 3–12)
Monocytes Absolute: 0.9 10*3/uL (ref 0.1–1.0)
NEUTROS PCT: 70 % (ref 43–77)
Neutro Abs: 5.8 10*3/uL (ref 1.7–7.7)
PLATELETS: 176 10*3/uL (ref 150–400)
RBC: 2.77 MIL/uL — AB (ref 4.22–5.81)
RDW: 17 % — ABNORMAL HIGH (ref 11.5–15.5)
WBC: 8.1 10*3/uL (ref 4.0–10.5)

## 2014-04-02 LAB — MAGNESIUM: MAGNESIUM: 2.3 mg/dL (ref 1.5–2.5)

## 2014-04-02 LAB — CK TOTAL AND CKMB (NOT AT ARMC)
CK, MB: 3.5 ng/mL (ref 0.3–4.0)
RELATIVE INDEX: 2 (ref 0.0–2.5)
Total CK: 176 U/L (ref 7–232)

## 2014-04-02 LAB — TRIGLYCERIDES: TRIGLYCERIDES: 236 mg/dL — AB (ref <150)

## 2014-04-02 LAB — LACTIC ACID, PLASMA: Lactic Acid, Venous: 0.8 mmol/L (ref 0.5–2.2)

## 2014-04-02 MED ORDER — BIOTENE DRY MOUTH MT LIQD: 15.0000 mL | Freq: Two times a day (BID) | OROMUCOSAL | Status: DC

## 2014-04-02 MED ORDER — DEXTROSE 50 % IV SOLN
INTRAVENOUS | Status: AC
  Administered 2014-04-02: 50 mL
  Filled 2014-04-02: qty 100

## 2014-04-02 MED ORDER — AMLODIPINE BESYLATE 10 MG PO TABS
10.0000 mg | ORAL_TABLET | Freq: Every day | ORAL | Status: DC
  Administered 2014-04-02 – 2014-04-05 (×4): 10 mg
  Filled 2014-04-02 (×4): qty 1

## 2014-04-02 MED ORDER — DEXTROSE 50 % IV SOLN
INTRAVENOUS | Status: AC
  Filled 2014-04-02: qty 50

## 2014-04-02 MED ORDER — DEXTROSE 50 % IV SOLN
50.0000 mL | INTRAVENOUS | Status: DC | PRN
  Administered 2014-04-02 (×2): 50 mL via INTRAVENOUS
  Filled 2014-04-02 (×2): qty 50

## 2014-04-02 MED ORDER — DEXTROSE 50 % IV SOLN
50.0000 mL | Freq: Once | INTRAVENOUS | Status: AC
  Administered 2014-04-02: 50 mL via INTRAVENOUS

## 2014-04-02 MED ORDER — DEXTROSE 50 % IV SOLN
INTRAVENOUS | Status: AC
  Administered 2014-04-02: 50 mL via INTRAVENOUS
  Filled 2014-04-02: qty 50

## 2014-04-02 MED ORDER — BIOTENE DRY MOUTH MT LIQD
15.0000 mL | Freq: Two times a day (BID) | OROMUCOSAL | Status: DC
  Administered 2014-04-02 – 2014-04-04 (×5): 15 mL via OROMUCOSAL

## 2014-04-02 MED ORDER — VITAL HIGH PROTEIN PO LIQD
1000.0000 mL | ORAL | Status: DC
  Filled 2014-04-02: qty 1000

## 2014-04-02 MED ORDER — PRO-STAT SUGAR FREE PO LIQD
30.0000 mL | Freq: Two times a day (BID) | ORAL | Status: DC
  Administered 2014-04-02: 30 mL
  Filled 2014-04-02 (×3): qty 30

## 2014-04-02 MED ORDER — DEXTROSE 50 % IV SOLN
50.0000 mL | Freq: Once | INTRAVENOUS | Status: AC | PRN
  Administered 2014-04-02: 50 mL via INTRAVENOUS

## 2014-04-02 MED ORDER — HYDRALAZINE HCL 20 MG/ML IJ SOLN
10.0000 mg | Freq: Four times a day (QID) | INTRAMUSCULAR | Status: DC
  Administered 2014-04-02: 10 mg via INTRAVENOUS
  Filled 2014-04-02: qty 1

## 2014-04-02 MED ORDER — DEXTROSE 10 % IV SOLN
INTRAVENOUS | Status: DC
  Administered 2014-04-02 – 2014-04-05 (×4): via INTRAVENOUS

## 2014-04-02 MED ORDER — CHLORHEXIDINE GLUCONATE 0.12 % MT SOLN
15.0000 mL | Freq: Two times a day (BID) | OROMUCOSAL | Status: DC
  Administered 2014-04-03 – 2014-04-04 (×3): 15 mL via OROMUCOSAL
  Filled 2014-04-02: qty 15

## 2014-04-02 MED ORDER — AMLODIPINE BESYLATE 10 MG PO TABS: 10.0000 mg | ORAL_TABLET | Freq: Every day | ORAL | Status: DC

## 2014-04-02 MED ORDER — PROPOFOL 10 MG/ML IV EMUL: 5.0000 ug/kg/min | INTRAVENOUS | Status: DC

## 2014-04-02 MED ORDER — DEXTROSE 5 % IV SOLN
INTRAVENOUS | Status: DC
  Administered 2014-04-02: 11:00:00 via INTRAVENOUS

## 2014-04-02 MED ORDER — HYDRALAZINE HCL 20 MG/ML IJ SOLN
10.0000 mg | Freq: Four times a day (QID) | INTRAMUSCULAR | Status: DC
  Administered 2014-04-02 – 2014-04-03 (×3): 20 mg via INTRAVENOUS
  Filled 2014-04-02 (×6): qty 1

## 2014-04-02 NOTE — Consult Note (Signed)
Patient WS:Matthew Beard      DOB: Mar 23, 1976      XNT:[id number]     Consult Note from the Palliative Medicine Team at [facility] Requested by: Dr. Earnest Conroy    PCP: [name], MD Reason for Consultation:GoC     Phone Number:[phone]  Assessment of patients Current state: 38 yr old african [place] male with a known history of end stage renal disease, htn, medical noncompliance.  Patient presented to hospital with syncopal episode.  He was found to have had a stroke and hypertensive emergency.  I met with the patients' brother Elberta Fortis, young sun Dezman and his mother, his current significant other and multiple aunts and uncles.  We updated each other on Giani's current medical condition.  We subsequently discussed options for caring for Ruffin including comfort care, limited code status.  Family agreed that Burns had been having trouble with making good choices regarding his medical care and had been noncompliant on multiple occasions.  Family decided after slightly heated conversation that they would try to optimize his ability to be extubated and see what function could be maintained.  They were hopeful for recovery.   An aunt was very clear that if Quaran was not going to recovery to a reasonable level she challenged the family to consider making decisions for comfort care. All other family members were upset with this and idea and the decision was to take it a day at a time.   Goals of Care: 1.  Code Status: Full code   2. Scope of Treatment: Continue curative care with the hope to optimizing extubation and moving on to probably LTAC.   4. Disposition: to be determined   3. Symptom Management:   Continue with curative care   4. Psychosocial: Patient is described as   5. Spiritual: Family aware of spiritual care availability       Patient Documents Completed or Given: Document Given Completed  Advanced Directives Pkt    MOST    DNR    Gone from My Sight     Hard Choices      Brief HPI: 38 yr old african [place] male with known history of ESRD, not compliant with treatment, fell out at home admitted with altered mental status, hypertension, found to have stroke.  We were asked to assist with Goals of care.   ROS: unable to assess due to altered mentation.    PMH:  Past Medical History  Diagnosis Date  . Hypertension   . CKD (chronic kidney disease), stage IV   . Normocytic anemia   . Pericardial effusion     Remote hx  . Cerebrovascular disease     Chronic lacunar infarcts  . NICM (nonischemic cardiomyopathy)   . CHF (congestive heart failure)   . Shortness of breath      PSH: Past Surgical History  Procedure Laterality Date  . Av fistula placement Left 10/19/2013    Procedure: ARTERIOVENOUS (AV) FISTULA CREATION- LEFT RADIOCEPHALIC;  Surgeon: Rosetta Posner, MD;  Location: [facility] OR;  Service: Vascular;  Laterality: Left;   I have reviewed the [place] and SH and  If appropriate update it with new information. Allergies  Allergen Reactions  . Amoxicillin Hives   Scheduled Meds: . antiseptic oral rinse  15 mL Mouth Rinse QID  . carvedilol  25 mg Per Tube BID WC  . chlorhexidine  15 mL Mouth Rinse BID  . darbepoetin (ARANESP) injection - DIALYSIS  100 mcg  Intravenous Q Wed-HD  . doxercalciferol  4 mcg Intravenous Q M,W,F-HD  . ferric gluconate (FERRLECIT/NULECIT) IV  125 mg Intravenous Q M,W,F-HD  . hydrALAZINE  50 mg Per Tube TID  . levETIRAcetam  500 mg Intravenous Q12H  . levofloxacin (LEVAQUIN) IV  250 mg Intravenous Q24H  . pantoprazole sodium  40 mg Per Tube Daily   Continuous Infusions: . dextrose 50 mL (04/02/14 1055)  . dextrose    . dialysis replacement fluid (prismasate) 500 mL/hr at 04/02/14 0339  . dialysis replacement fluid (prismasate) 300 mL/hr at 04/01/14 1500  . dialysate (PRISMASATE) 2,000 mL/hr at 04/02/14 0339  . propofol 20 mcg/kg/min (04/02/14 1045)   PRN Meds:.sodium chloride, dextrose, fentaNYL,  heparin, sodium chloride    BP 188/109  Pulse 54  Temp(Src) 97 F (36.1 C) (Oral)  Resp 8  Ht 5' 10" (1.778 m)  Wt 80.5 kg (177 lb 7.5 oz)  BMI 25.46 kg/m2  SpO2 98%   PPS:30%   Intake/Output Summary (Last 24 hours) at 04/02/14 1137 Last data filed at 04/02/14 1100  Gross per 24 hour  Intake    440 ml  Output    851 ml  Net   -411 ml    Physical Exam:  General: will open eyes to tactile and verbal stimuli but unable to follow commands HEENT:  PERRL, EOMI, intubated, mmm, currently dialyzing through right IJ approach Chest:   Decreased with some anterior rhonchi CVS: brady, S1, S2 no S3 or S4 Abdomen:soft , no grimace, positive bowel sounds Ext: warm, trace edema Neuro:altered, not following commands but is no opening eyes briefly.  Labs: CBC    Component Value Date/Time   WBC 8.1 04/02/2014 0309   RBC 2.77* 04/02/2014 0309   RBC 3.22* 08/19/2009 0430   HGB 8.0* 04/02/2014 0309   HCT 25.1* 04/02/2014 0309   PLT 176 04/02/2014 0309   MCV 90.6 04/02/2014 0309   MCH 28.9 04/02/2014 0309   MCHC 31.9 04/02/2014 0309   RDW 17.0* 04/02/2014 0309   LYMPHSABS 1.3 04/02/2014 0309   MONOABS 0.9 04/02/2014 0309   EOSABS 0.2 04/02/2014 0309   BASOSABS 0.0 04/02/2014 0309      CMP     Component Value Date/Time   NA 137 04/02/2014 0309   K 4.1 04/02/2014 0309   CL 102 04/02/2014 0309   CO2 23 04/02/2014 0309   GLUCOSE 74 04/02/2014 0309   BUN 26* 04/02/2014 0309   CREATININE 3.64* 04/02/2014 0309   CREATININE 2.41* 02/22/2012 1452   CALCIUM 7.8* 04/02/2014 0309   PROT 7.8 03/29/2014 2255   ALBUMIN 2.1* 04/02/2014 0309   AST 20 03/29/2014 2255   ALT 19 03/29/2014 2255   ALKPHOS 80 03/29/2014 2255   BILITOT 0.5 03/29/2014 2255   GFRNONAA 20* 04/02/2014 0309   GFRAA 23* 04/02/2014 0309    Chest Xray Reviewed/Impressions: 1. Stable positioning of support apparatus. No pneumothorax.  2. Marked improved aeration of the lungs suggests resolving edema  and atelectasis   CT scan of the  Head Reviewed/Impressions: 1. No intracranial aneurysm identified.  2. Mild anterior and posterior circulation branches vessel  irregularity, suggestive of mild intracranial atherosclerosis. No  evidence of significant proximal stenosis.  3. Unchanged, acute left temporal lobe parenchymal hemorrhage.     Time In Time Out Total Time Spent with Patient Total Overall Time  10 am  1110 am 15 min 70 min    Greater than 50%  of this time was spent counseling and coordinating  care related to the above assessment and plan.   Aidynn Krenn L. Lovena Le, MD MBA The Palliative Medicine Team at [facility] Phone: [phone] Pager: [phone]

## 2014-04-02 NOTE — Progress Notes (Signed)
Hypoglycemic Event  CBG: 64  Treatment: D50 IV 50 mL  Symptoms: None  Follow-up CBG: Time:117 CBG Result:1120  Possible Reasons for Event: Inadequate meal intake  Comments/MD notified:Ramaswamy     Matthew Beard  Remember to initiate Hypoglycemia Order Set & complete

## 2014-04-02 NOTE — Progress Notes (Signed)
PULMONARY / CRITICAL CARE MEDICINE   Name: Matthew Beard MRN: [id number] DOB: [birth year]   PCP [name], MD   ADMISSION DATE:  03/29/2014 CONSULTATION DATE:  03/30/14  REFERRING MD :  EDP PRIMARY SERVICE: PCCM  CHIEF COMPLAINT:  Cerebral hemorrhage  BRIEF PATIENT DESCRIPTION:   . [name] is a 38 y.o. M with PMH significant for ESRD (non-compliant with dialysis), HTN, anemia, NICM, CHF, SOB, who presented to ED on 5/11 after he was at home and per ED notes, fell backwards and began to shake.  His fiances children witnessed the event and called her.  He was in his USOH prior to this episode although fiance notes that his health has not been very good of late.   In ED, initial BP was 209/142 and got as high as 249/144.  Head CT was obtained and revealed a small focal hemorrhage at junction of left parietal and temporal lobes. Pt was intubated due to agitation/attempting to pull at lines/confusion/concern for lack of ability to protect airway. Per pt's fiance, he is non-compliant with his home meds as well as dialysis and she believes that he was supposed to have dialysis today, however, missed it. Of note, pt was recently [facility] from [facility] on 4/28 after he was admitted and treated for volume overload and found to have ESRD.   SIGNIFICANT EVENTS / STUDIES:  5/11 - fell backwards at home and began shaking, questionable loss of consciousness. 5/12 Head CT >>> small hemorrhage at junction of left temporal and parietal lobes.  Intubated by EDP for airway protection.  LINES / TUBES: OETT 5/12 >>> OGT 5/12 >>> Foley 5/12 >>> Rt IJ Trialysis cath 03/30/14 >>  CULTURES: Sputum 5/12 >>>   ANTIBIOTICS: Vanc 5/11 >>5/11 Maxipime 5/11 >>>5/12 Imipenem  5/12 >>5/13 Levaquin 5/13 (aspn) >>     EVENTS   03/30/14: Dr Pearlean Brownie recommends SBP 120-160 as goal. Patient very hypertensive and agitated. CT angio head shows no IC aneurysm but has acute left temporal lobe hge. : Remains  hypertensive on propofol and cardene.  Intubated by EDP.    03/31/14: MRI with 1.8cm acute left temporal lobe bleed with multiple micro infarcts and bleed. On diprivan: RASS -3 but doing sbt.   Off cardene. On serveral home bp meds - sbp 111 . ECHO shows sudden drop in EF from 50% in march 2015 tor 25% now. Global hypokinesis   04/01/14: Cards reckons hypertensive "blown out" cardiomyopathy. On CVVH ongoing. Off sedation: not awake enough for extubation though doing SBT    SUBJECTIVE/OVERNIGHT/INTERVAL HX 04/02/14: Palliative care meeting: family understands poor long term prognosis. 34 year old son struggling will patient illness but understands gravity of situation. Full code for now including reintubation. Patient agitated but not following commands but good cough and gag. Ok from neuro stand point to extubate. Having hypoglycemia  VITAL SIGNS: Temp:  [97 F (36.1 C)-98.3 F (36.8 C)] 97 F (36.1 C) (05/15 0900) Pulse Rate:  [45-87] 54 (05/15 0900) Resp:  [8-28] 8 (05/15 0900) BP: (126-188)/(81-119) 188/109 mmHg (05/15 0900) SpO2:  [92 %-100 %] 98 % (05/15 0900) FiO2 (%):  [40 %] 40 % (05/15 0900) Weight:  [80.5 kg (177 lb 7.5 oz)] 80.5 kg (177 lb 7.5 oz) (05/15 0344) HEMODYNAMICS:   VENTILATOR SETTINGS: Vent Mode:  [-] PSV FiO2 (%):  [40 %] 40 % Set Rate:  [14 bmp] 14 bmp Vt Set:  [580 mL] 580 mL PEEP:  [5 cmH20] 5 cmH20 Pressure Support:  [5 cmH20-10  cmH20] 5 cmH20 Plateau Pressure:  [19 cmH20-20 cmH20] 20 cmH20 INTAKE / OUTPUT: Intake/Output     05/14 0701 - 05/15 0700 05/15 0701 - 05/16 0700   I.V. (mL/kg) 220 (2.7)    NG/GT 75    IV Piggyback 260    Total Intake(mL/kg) 555 (6.9)    Urine (mL/kg/hr) 240 (0.1)    Emesis/NG output     Other 591 (0.3) 52 (0.1)   Total Output 831 52   Net -276 -52          PHYSICAL EXAMINATION: General: Young AA male, , hypertensive. Neuro:  Intubated/sedated off propofol, RASS +2 to +3, blinks and opens eyes and ? some tracking,  not following commands HEENT: [place]/AT. PERRL, sclerae anicteric. Cardiovascular: tachy but regular, no M/R/G.  Lungs: Respirations even and unlabored on full vent support.  CTA bilaterally, No W/R/R.  Abdomen: BS x 4, soft, NT/ND.  Musculoskeletal: No gross deformities, 2+ edema to LE's bilaterally.  Fistula noted LUE. Skin: Intact, warm, no rashes.    LABS: PULMONARY  Recent Labs Lab 03/29/14 2307 03/30/14 0122 03/30/14 0422 03/30/14 0559  PHART 7.463* 7.424 7.431 7.429  PCO2ART 28.4* 33.9* 31.4* 31.8*  PO2ART 69.0* 102.0* 46.6* 329.0*  HCO3 20.0 22.0 20.5 20.7  TCO2 21 23 21.5 21.7  O2SAT 93.0 98.0 77.2 100.0    CBC  Recent Labs Lab 03/31/14 0430 04/01/14 0400 04/02/14 0309  HGB 8.1* 8.9* 8.0*  HCT 25.2* 26.8* 25.1*  WBC 6.6 5.3 8.1  PLT 221 194 176    COAGULATION  Recent Labs Lab 03/30/14 0052  INR 1.30    CARDIAC    Recent Labs Lab 03/30/14 0637 03/30/14 1035 03/30/14 1555 03/30/14 2330 03/31/14 0430  TROPONINI 0.45* 0.56* 0.51* 0.80* 0.71*   No results found for this basename: PROBNP,  in the last 168 hours   CHEMISTRY  Recent Labs Lab 03/30/14 0637 03/31/14 0430 03/31/14 1500 04/01/14 0400 04/01/14 1600 04/02/14 0309  NA 137 143 140 141  140 136* 137  K 5.4* 5.6* 4.4 4.3  4.3 4.3 4.1  CL 101 103 102 105  104 100 102  CO2 19 19 21 21  21 22 23   GLUCOSE 93 70 92 77  76 82 74  BUN 54* 66* 63* 46*  45* 35* 26*  CREATININE 7.43* 8.49* 7.57* 5.61*  5.67* 4.54* 3.64*  CALCIUM 8.3* 8.1* 7.9* 7.5*  7.5* 7.8* 7.8*  MG 2.2 2.3  --  2.3  --  2.3  PHOS 5.6* 6.5* 6.6* 5.1*  5.1* 4.6 3.5   Estimated Creatinine Clearance: 28.1 ml/min (by C-G formula based on Cr of 3.64).   LIVER  Recent Labs Lab 03/29/14 2255 03/30/14 0052 03/31/14 1500 04/01/14 0400 04/01/14 1600 04/02/14 0309  AST 20  --   --   --   --   --   ALT 19  --   --   --   --   --   ALKPHOS 80  --   --   --   --   --   BILITOT 0.5  --   --   --   --   --    PROT 7.8  --   --   --   --   --   ALBUMIN 2.9*  --  2.2* 1.8* 2.1* 2.1*  INR  --  1.30  --   --   --   --      INFECTIOUS  Recent Labs Lab  03/29/14 2314 03/31/14 0450 04/02/14 0310  LATICACIDVEN 2.71* 0.8 0.8     ENDOCRINE CBG (last 3)   Recent Labs  04/02/14 0354 04/02/14 0434 04/02/14 0807  GLUCAP 66* 95 46*         IMAGING x48h  Mr Brain Wo Contrast  03/31/2014   CLINICAL DATA:  Intracranial hemorrhage. End-stage renal disease, non compliant. Blood pressure elevated on initial presentation.  EXAM: MRI HEAD WITHOUT CONTRAST  TECHNIQUE: Multiplanar, multiecho pulse sequences of the brain and surrounding structures were obtained without intravenous contrast.  COMPARISON:  CT head 03/29/2014.  CTA 03/30/2014.  FINDINGS: Multifocal subcentimeter areas of acute infarction affect the bilateral frontal, parietal, temporal, and occipital regions, as well as the right cerebellum. Their distribution, in the junction zones between the ACA/ MCA, and MCA/PCA vascular territories, suggests a watershed type event, possible arrhythmia or hypotensive episode. No features suggestive of PRES.  Re-demonstrated is a roughly spherical 18 mm acute hemorrhage located in the left temporal lobe with moderate surrounding edema consistent with an acute intracerebral hematoma. There is no midline shift or uncal herniation. No features suggestive of hemorrhagic metastasis. No abnormalities to suggest of vascular lesion such as aneurysm or AVM. There is an old chronic hemorrhage involving the genu of the corpus callosum. Too numerous to count microbleeds, seen best on gradient sequence throughout the cerebral hemispheres, brainstem, and cerebellum suggests that this lesion represents a complication of longstanding hypertensive cerebrovascular disease. Cerebral amyloid angiopathy is not excluded, but less favored.  Premature for age cerebral and cerebellar atrophy. There is extensive T2 and FLAIR  hyperintensities throughout the periventricular and subcortical white matter, also involving the brainstem, consistent with chronic microvascular ischemic change. Flow voids are maintained in the major intracranial vessels. Large remote lacunar infarcts are seen in the brainstem, thalamus, and subcortical white matter.  Partial empty sella. No tonsillar herniation. No worrisome osseous lesions. Marrow changes consistent with end-stage renal disease with anemia. Negative orbits. No acute sinus or mastoid disease.  IMPRESSION: Findings most consistent with an acute 1.8 cm left temporal lobe intracerebral hematoma as a complication of hypertensive cerebrovascular disease. Widespread microbleeds, chronic corpus callosum hemorrhagic infarction, multiple lacunes, and microvascular ischemic change throughout the white matter are also noted. Lobar hemorrhage from cerebral amyloid angiopathy is not excluded but less favored.  Multiple subcentimeter acute infarcts throughout the supratentorial region also affecting the right cerebellum; watershed type event is favored; correlate clinically with arrhythmia or hypotensive episode.   Electronically Signed   By: Davonna Belling M.D.   On: 03/31/2014 12:29   Dg Chest Port 1 View  04/02/2014   CLINICAL DATA:  Endotracheal tube  EXAM: PORTABLE CHEST - 1 VIEW  COMPARISON:  DG CHEST 1V PORT dated 03/31/2014; DG CHEST 1V PORT dated 03/30/2014; DG CHEST 1V PORT dated 03/29/2014; DG CHEST 1V PORT dated 03/08/2014; DG CHEST 2 VIEW dated 02/11/2014  FINDINGS: Grossly unchanged enlarged cardiac silhouette and mediastinal contours. Stable positioning of support apparatus. No pneumothorax. Marked improved aeration of the lungs with persistent bilateral infrahilar opacities, left greater than right. Trace a sided effusion is not excluded. No definite evidence of edema. No pneumothorax. Unchanged bones.  IMPRESSION: 1.  Stable positioning of support apparatus.  No pneumothorax. 2. Marked improved  aeration of the lungs suggests resolving edema and atelectasis.   Electronically Signed   By: Simonne Come M.D.   On: 04/02/2014 07:52      ASSESSMENT / PLAN:  NEUROLOGIC A:  ICH - likely secondary to significantly  elevated BP in the setting of hypertensive emergency. Questionable seizure. Urine tox negative.   - intermittently agitatedbut some improved Aphasic per neuro. IF not waking up more, neuro will consider EEG  P:   - Neurosurgery and neurology consult appreciated - Keppra. - dc sedation of diprivan and fentanyl   CARDIOVASCULAR A:  Hypertensive Emergency - BP up to as high as 249/144 (MAP 164) Hx of non-ischemic cardiomyopathy due to hypertensive heart disease     - new drop in EF to 25%. BP now below goal of 120-160 sbp.  - HR 53 and sinus on coreg  P:  - SBP goal 120-160 per neuro - cards mgmt appreciated - coreg and hydralazine to continue   PULMONARY A: VDRF - in the setting of ICH. Concern for aspiration.   - does well on SBT but aphasic but restless  P:   - Trial of extubation - VAP bundle. - Empiric abx  .  RENAL A:  ESRD   - renal doing cvvh in setting of IC Hge and VDRF   P:   - per Nephrology consult.   GASTROINTESTINAL A:   NPO P:   - SUP: Pantoprazole. - continue NPO for extubation  HEMATOLOGIC A:   No acute issues. P:  - VTE prophylaxis:  SCD's only. - CBC in AM.  INFECTIOUS A:   Concern for aspiration. P:   - RXt levaquin (total 7d abx); 04/06/14 - Monitor fever curve / WBC's.  ENDOCRINE A:   Having hypoglycemia 04/02/14 despite d5w P:   - change to D10 at kvo - CBG's q4. - SSI.  GLOBAL   04/02/14: See detailed palliative care note; full code for now   The patient is critically ill with multiple organ systems failure and requires high complexity decision making for assessment and support, frequent evaluation and titration of therapies, application of advanced monitoring technologies and extensive  interpretation of multiple databases.   Critical Care Time devoted to patient care services described in this note is  35 additional  Minutes.  Dr. Kalman Shan, M.D., [facility].C.P Pulmonary and Critical Care Medicine Staff Physician [facility] Pager: [phone], If no answer or between  15:00h - 7:00h: call [phone]  04/02/2014 11:36 AM

## 2014-04-02 NOTE — Procedures (Signed)
Extubation Procedure Note  Patient Details:   Name: Matthew Beard DOB: [birth year] MRN: [id number]   Pt extubated to 4L [place] per MD order. No stridor noted, VS WNL, pt tolerating well at this time. RT will continue to monitor.    Evaluation  O2 sats: stable throughout Complications: No apparent complications Patient did tolerate procedure well. Bilateral Breath Sounds: Clear Suctioning: Airway No  Harley Hallmark 04/02/2014, 2:12 PM

## 2014-04-02 NOTE — Progress Notes (Signed)
SLP Cancellation Note  Patient Details Name: Matthew Beard MRN: [id number] DOB: Feb 01, 1976   Cancelled treatment:       Reason Eval/Treat Not Completed: Patient not medically ready. Orders for swallow eval. Per chart pt recently extubated, hypertensive and agitated. Will f/u for swallow eval tomorrow.    Riley Nearing Teriyah Purington 04/02/2014, 12:57 PM

## 2014-04-02 NOTE — Progress Notes (Signed)
ANTIBIOTIC CONSULT NOTE - FOLLOW UP  Pharmacy Consult for Levaquin Indication: r/o aspiration pneumonia  Allergies  Allergen Reactions  . Amoxicillin Hives   Patient Measurements: Height: 5\' 10"  (177.8 cm) Weight: 177 lb 7.5 oz (80.5 kg) IBW/kg (Calculated) : 73 Vital Signs: Temp: 97 F (36.1 C) (05/15 0900) Temp src: Oral (05/15 0900) BP: 188/109 mmHg (05/15 0900) Pulse Rate: 54 (05/15 0900) Intake/Output from previous day: 05/14 0701 - 05/15 0700 In: 555 [I.V.:220; NG/GT:75; IV Piggyback:260] Out: 831 [Urine:240] Intake/Output from this shift: Total I/O In: -  Out: 52 [Other:52]  Labs:  Recent Labs  03/31/14 0430  04/01/14 0400 04/01/14 1600 04/02/14 0309  WBC 6.6  --  5.3  --  8.1  HGB 8.1*  --  8.9*  --  8.0*  PLT 221  --  194  --  176  CREATININE 8.49*  < > 5.61*  5.67* 4.54* 3.64*  < > = values in this interval not displayed. Estimated Creatinine Clearance: 28.1 ml/min (by C-G formula based on Cr of 3.64).   Assessment: 38 YOM on Levaquin for r/o aspiration with stop date for 04/06/14. Patient remains on CRRT with plans to continue until Sunday at this point. WBC is wnl. Afebrile.  Goal of Therapy:  Clinical resolution of infection  Plan:  Continue Levaquin 250mg  IV q24h while on CRRT.  Link Snuffer, PharmD, BCPS Clinical Pharmacist [phone] 04/02/2014,11:46 AM

## 2014-04-02 NOTE — Progress Notes (Signed)
Filter clotting againg. 4th time in 24 hours, discussed with Dr Hyman Hopes. No changes to current treatment

## 2014-04-02 NOTE — Progress Notes (Signed)
Hypoglycemic Event  CBG: 47  Treatment: D50 IV 50 mL  Symptoms: None  Follow-up CBG: Time:1600  CBG Result:52  Possible Reasons for Event: Unknown  Comments/MD notified:Ramaswamy     Gatlin Kittell O Mitsuye Schrodt  Remember to initiate Hypoglycemia Order Set & complete

## 2014-04-02 NOTE — Consult Note (Signed)
Patient NM:Matthew Beard      DOB: 1976-04-04      SUP:[id number]  Summary of Goals of care; full not to follow:  Met with extended family which includes a 39 yr old son.  Brother Elberta Fortis appointed as go to surrogate with help from extended family. Discussed the current injury and physical limitations regarding heart and kidneys.  Discussed code status and potential for need to intervene with CPR.  Family working through the process at this time support the idea of maximizing potential to extubate. Reintubate if needed then discuss next steps.  Continue to give [place] time and benefit of doubt toward some meaningful recovery.  Family open to and we talked realistically about having to make end of life choices at some point.    Recommend:  1.  Full code.  Extubate at optimal time to prevent decompensation  2.  Continue with dialysis and curative treatments.  More to follow.  Time 10 -[place] Lovena Le, MD MBA The Palliative Medicine Team at [facility] Phone: [phone] Pager: [phone]

## 2014-04-02 NOTE — Progress Notes (Signed)
Hypoglycemic Event  CBG: 58  Treatment: D50 IV 50 mL  Symptoms: None  Follow-up CBG: Time:1048 CBG Result:64  Possible Reasons for Event: Inadequate meal intake  Comments/MD notified:Ramaswamy     Zacaria Pousson O Skyelar Swigart  Remember to initiate Hypoglycemia Order Set & complete

## 2014-04-02 NOTE — Progress Notes (Signed)
2 episodes of hypoglycemia, 1 amp of d50 given per protocol each time. Discussed with Dr Dr Marchelle Gearing. Plans to start D5 at 20

## 2014-04-02 NOTE — Progress Notes (Addendum)
Stroke Team Progress Note  HISTORY Matthew Beard is a 38 y.o. 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  Patient was home with his children when suddenly collapsed to the ground and had a generalized convulsion. EMS called and patient brought to the ED where He was confused with SBP 249.  Urgent CT brain revealed a focus of parenchymal hemorrhage at the junction of the left temporal and left parietal lobes which measures 1.6 x 1.6 cm.Mild surrounding edema but no mass effect or IV extension.  PTT 37. INR 1.30. Platelets 198  Patient was not protecting his airway and was intubated in the ED.  Loaded with 1 gram IV keppra. Nicardipine infusion started.   Date last known well: 03/29/14  Time last known well: unclear  tPA Given: no, ICH   SUBJECTIVE Family member present this morning. The patient remains intubated but is more alert. He responds to stimuli but is still not following commands. There is a possibility that he is a.phasic  OBJECTIVE Most recent Vital Signs: Filed Vitals:   04/02/14 0400 04/02/14 0500 04/02/14 0600 04/02/14 0700  BP: 126/83 166/99 158/105 130/84  Pulse: 48 48 50 45  Temp:      TempSrc:      Resp: 16 20 17 15   Height:      Weight:      SpO2: 100%  100% 100%   CBG (last 3)   Recent Labs  04/02/14 0024 04/02/14 0354 04/02/14 0434  GLUCAP 79 66* 95    IV Fluid Intake:   . dialysis replacement fluid (prismasate) 500 mL/hr at 04/02/14 0339  . dialysis replacement fluid (prismasate) 300 mL/hr at 04/01/14 1500  . dialysate (PRISMASATE) 2,000 mL/hr at 04/02/14 0339  . propofol Stopped (04/01/14 0723)    MEDICATIONS  . antiseptic oral rinse  15 mL Mouth Rinse QID  . carvedilol  25 mg Per Tube BID WC  . chlorhexidine  15 mL Mouth Rinse BID  .  darbepoetin (ARANESP) injection - DIALYSIS  100 mcg Intravenous Q Wed-HD  . doxercalciferol  4 mcg Intravenous Q M,W,F-HD  . ferric gluconate (FERRLECIT/NULECIT) IV  125 mg Intravenous Q M,W,F-HD  . hydrALAZINE  50 mg Per Tube TID  . insulin aspart  0-15 Units Subcutaneous 6 times per day  . levETIRAcetam  500 mg Intravenous Q12H  . levofloxacin (LEVAQUIN) IV  250 mg Intravenous Q24H  . pantoprazole sodium  40 mg Per Tube Daily   PRN:  sodium chloride, fentaNYL, heparin, sodium chloride  Diet:  NPO no liquids Activity:  Bedrest DVT Prophylaxis:  SCDs  CLINICALLY SIGNIFICANT STUDIES Basic Metabolic Panel:   Recent Labs Lab 04/01/14 0400 04/01/14 1600 04/02/14 0309  NA 141  140 136* 137  K 4.3  4.3 4.3 4.1  CL 105  104 100 102  CO2 21  21 22 23   GLUCOSE 77  76 82 74  BUN 46*  45* 35* 26*  CREATININE 5.61*  5.67* 4.54* 3.64*  CALCIUM 7.5*  7.5* 7.8* 7.8*  MG 2.3  --  2.3  PHOS 5.1*  5.1* 4.6 3.5   Liver Function Tests:   Recent Labs Lab 03/29/14 2255  04/01/14 1600 04/02/14 0309  AST 20  --   --   --   ALT 19  --   --   --  ALKPHOS 80  --   --   --   BILITOT 0.5  --   --   --   PROT 7.8  --   --   --   ALBUMIN 2.9*  < > 2.1* 2.1*  < > = values in this interval not displayed. CBC:   Recent Labs Lab 04/01/14 0400 04/02/14 0309  WBC 5.3 8.1  NEUTROABS 3.8 5.8  HGB 8.9* 8.0*  HCT 26.8* 25.1*  MCV 89.0 90.6  PLT 194 176   Coagulation:   Recent Labs Lab 03/30/14 0052  LABPROT 15.9*  INR 1.30   Cardiac Enzymes:   Recent Labs Lab 03/30/14 1555 03/30/14 2330 03/31/14 0430 04/02/14 0310  CKTOTAL  --   --  75 176  CKMB  --   --  2.1 3.5  TROPONINI 0.51* 0.80* 0.71*  --    Urinalysis:   Recent Labs Lab 03/29/14 2251  COLORURINE YELLOW  LABSPEC 1.019  PHURINE 6.5  GLUCOSEU NEGATIVE  HGBUR LARGE*  BILIRUBINUR NEGATIVE  KETONESUR NEGATIVE  PROTEINUR >300*  UROBILINOGEN 1.0  NITRITE NEGATIVE  LEUKOCYTESUR NEGATIVE   Lipid Panel     Component Value Date/Time   CHOL 187 01/22/2012 0515   TRIG 236* 04/02/2014 0309   HDL 38* 01/22/2012 0515   CHOLHDL 4.9 01/22/2012 0515   VLDL 25 01/22/2012 0515   LDLCALC 124* 01/22/2012 0515   HgbA1C  Lab Results  Component Value Date   HGBA1C 5.3 03/08/2014    Urine Drug Screen:     Component Value Date/Time   LABOPIA NONE DETECTED 03/29/2014 2251   LABOPIA NEGATIVE 03/08/2014 2108   COCAINSCRNUR NONE DETECTED 03/29/2014 2251   COCAINSCRNUR NEGATIVE 03/08/2014 2108   LABBENZ NONE DETECTED 03/29/2014 2251   LABBENZ NEGATIVE 03/08/2014 2108   AMPHETMU NONE DETECTED 03/29/2014 2251   AMPHETMU NEGATIVE 03/08/2014 2108   THCU NONE DETECTED 03/29/2014 2251   LABBARB NONE DETECTED 03/29/2014 2251    Alcohol Level: No results found for this basename: ETH,  in the last 168 hours  Ct Head Wo Contrast 03/29/2014    Significantly limited exam by patient motion artifact.  A focus of hemorrhage is noted as described above at the junction of the left temporal and parietal lobes. Mild surrounding edema is noted.      Dg Chest Port 1 View 03/30/2014 Endotracheal tube as described.  Increasing bilateral infiltrative changes.   Electronically Signed   By: Alcide Clever M.D.   On: 03/30/2014 00:21   Dg Chest Port 1 View 03/29/2014    Cardiac enlargement.  No evidence of active pulmonary disease.    CT Angio of head 03/30/2014 1. No intracranial aneurysm identified. 2. Mild anterior and posterior circulation branches vessel irregularity, suggestive of mild intracranial atherosclerosis. No evidence of significant proximal stenosis. 3. Unchanged, acute left temporal lobe parenchymal hemorrhage.  EEG Abnormalities:  1) Triphasic waves  2) generalized slow activity  Clinical Interpretation: This EEG is consistent with a generalized nonspecific cerebral dysfunction. Triphasic waves, nonspecific, can be associated with metabolic encephalopathy such as renal failure.    2D Echocardiogram  Left ventricle:  The cavity size was normal. Wall thickness was increased in a pattern of severe LVH.  Systolic function was severely reduced. The estimated ejection fraction was in the range of 25% to 30%.  Severe hypokinesis of the entire myocardium.   Carotid Doppler  pending  CXR    EKG  Normal sinus rhythm rate 96 beats per minute.  For complete results  please see formal report.   Therapy Recommendations pending  Physical Exam   Mental status: off propofol    Sleepy but arousable CN 2-12" pupils 3-4 mm bilaterally, reactive. No gaze preference.mild vertical skew with LE hypertropia No nystagmus. Corneal reflex present    Tongue: midline.intubated. Not following commands Motor:brisk purposeful withdrawal in all 4 extremities  Right greater than left to noxious stimuli. Spontaneous left arm and leg movements noted. Sensory: mild reaction to pain but on propofol.  DTR's: unable to elicit, on propofol  Coordination and gait: unable to test.   ASSESSMENT Matthew Beard is a 38 y.o. male presenting with a witnessed seizure and collapse. TPA was not initiated secondary to hemorrhage. Head CT - a focus of hemorrhage is noted at the junction of the left temporal and parietal lobes.On no atherothrombotics prior to admission. Now on no atherothrombotics for secondary stroke prevention due to hemorrhagic stroke. Patient with resultant unresponsiveness. Stroke work up underway.   Hypertension  History of noncompliance with medications  ESRD - HD  Nonischemic cardiomyopathy. Ejection fraction 25-30%.  History of lacunar infarcts  Anemia   Hospital day # 4  TREATMENT/PLAN  No antithrombotics at this time for secondary stroke prevention due to hemorrhagic stroke.  Await carotid Dopplers  On Keppra. EEG - generalized nonspecific cerebral dysfunction.   Keep systolic blood pressure less than 160  Minimize sedation and ventilator wean if possible  Palliative care to meet with the family  today at 10 AM    D/w Dr Marchelle Gearing,    Hassel Neth [facility] Pager [phone] 04/02/2014, 11:05 AM   I have personally examined this patient, reviewed pertinent data, the plan of care and agree with the above Delia Heady, MD      To contact Stroke Continuity provider, please refer to [email or web address]. After hours, contact General Neurology

## 2014-04-02 NOTE — Progress Notes (Signed)
eLink Physician-Brief Progress Note Patient Name: Matthew Beard DOB: June 30, 1976 MRN: [id number]  Date of Service  04/02/2014   HPI/Events of Note     eICU Interventions  - added his home amlodipine, increased hydralazine   Intervention Category Intermediate Interventions: Hypertension - evaluation and management  Leslye Peer 04/02/2014, 5:50 PM

## 2014-04-02 NOTE — Progress Notes (Signed)
[facility] ROUNDING NOTE   Subjective:   Interval History: restless and agitated   Objective:  Vital signs in last 24 hours:  Temp:  [97 F (36.1 C)-98.3 F (36.8 C)] 97.3 F (36.3 C) (05/15 1235) Pulse Rate:  [45-87] 52 (05/15 1226) Resp:  [8-27] 18 (05/15 1226) BP: (126-188)/(81-141) 168/110 mmHg (05/15 1226) SpO2:  [92 %-100 %] 100 % (05/15 1226) FiO2 (%):  [40 %] 40 % (05/15 1235) Weight:  [80.5 kg (177 lb 7.5 oz)] 80.5 kg (177 lb 7.5 oz) (05/15 0344)  Weight change: -2.8 kg (-6 lb 2.8 oz) Filed Weights   03/31/14 0500 04/01/14 0437 04/02/14 0344  Weight: 85.7 kg (188 lb 15 oz) 83.3 kg (183 lb 10.3 oz) 80.5 kg (177 lb 7.5 oz)    Intake/Output: I/O last 3 completed shifts: In: 886.7 [I.V.:446.7; NG/GT:75; IV Piggyback:365] Out: 1467 [Urine:550; Emesis/NG output:140; Other:777]   Intake/Output this shift:  Total I/O In: -  Out: 114 [Other:114]  CVS- RRR RS- CTA ABD- BS present soft non-distended EXT- no edema   Basic Metabolic Panel:  Recent Labs Lab 03/30/14 0637 03/31/14 0430 03/31/14 1500 04/01/14 0400 04/01/14 1600 04/02/14 0309  NA 137 143 140 141  140 136* 137  K 5.4* 5.6* 4.4 4.3  4.3 4.3 4.1  CL 101 103 102 105  104 100 102  CO2 19 19 21 21  21 22 23   GLUCOSE 93 70 92 77  76 82 74  BUN 54* 66* 63* 46*  45* 35* 26*  CREATININE 7.43* 8.49* 7.57* 5.61*  5.67* 4.54* 3.64*  CALCIUM 8.3* 8.1* 7.9* 7.5*  7.5* 7.8* 7.8*  MG 2.2 2.3  --  2.3  --  2.3  PHOS 5.6* 6.5* 6.6* 5.1*  5.1* 4.6 3.5    Liver Function Tests:  Recent Labs Lab 03/29/14 2255 03/31/14 1500 04/01/14 0400 04/01/14 1600 04/02/14 0309  AST 20  --   --   --   --   ALT 19  --   --   --   --   ALKPHOS 80  --   --   --   --   BILITOT 0.5  --   --   --   --   PROT 7.8  --   --   --   --   ALBUMIN 2.9* 2.2* 1.8* 2.1* 2.1*   No results found for this basename: LIPASE, AMYLASE,  in the last 168 hours  Recent Labs Lab 03/29/14 2255  AMMONIA 16     CBC:  Recent Labs Lab 03/29/14 2255 03/30/14 0637 03/31/14 0430 04/01/14 0400 04/02/14 0309  WBC 9.7 8.5 6.6 5.3 8.1  NEUTROABS 8.4*  --  4.9 3.8 5.8  HGB 8.4* 8.0* 8.1* 8.9* 8.0*  HCT 25.9* 24.7* 25.2* 26.8* 25.1*  MCV 91.5 90.5 89.7 89.0 90.6  PLT 198 212 221 194 176    Cardiac Enzymes:  Recent Labs Lab 03/30/14 0637 03/30/14 1035 03/30/14 1555 03/30/14 2330 03/31/14 0430 04/02/14 0310  CKTOTAL  --   --   --   --  75 176  CKMB  --   --   --   --  2.1 3.5  TROPONINI 0.45* 0.56* 0.51* 0.80* 0.71*  --     BNP: No components found with this basename: POCBNP,   CBG:  Recent Labs Lab 04/02/14 0024 04/02/14 0354 04/02/14 0434 04/02/14 0807 04/02/14 1217  GLUCAP 79 66* 95 46* 129*    Microbiology: Results for orders placed  during the hospital encounter of 03/29/14  URINE CULTURE     Status: None   Collection Time    03/29/14 10:51 PM      Result Value Ref Range Status   Specimen Description URINE, CATHETERIZED   Final   Special Requests NONE   Final   Culture  Setup Time     Final   Value: 03/29/2014 23:32     Performed at [facility] Count     Final   Value: NO GROWTH     Performed at [facility]   Culture     Final   Value: NO GROWTH     Performed at [facility]   Report Status 03/31/2014 FINAL   Final  CULTURE, BLOOD (ROUTINE X 2)     Status: None   Collection Time    03/29/14 10:55 PM      Result Value Ref Range Status   Specimen Description BLOOD ARM RIGHT   Final   Special Requests BOTTLES DRAWN AEROBIC AND ANAEROBIC 5CC EA   Final   Culture  Setup Time     Final   Value: 03/30/2014 04:10     Performed at [facility]   Culture     Final   Value:        BLOOD CULTURE RECEIVED NO GROWTH TO DATE CULTURE WILL BE HELD FOR 5 DAYS BEFORE ISSUING A FINAL NEGATIVE REPORT     Performed at [facility]   Report Status PENDING   Incomplete  CULTURE, BLOOD (ROUTINE X 2)     Status: None    Collection Time    03/29/14 11:03 PM      Result Value Ref Range Status   Specimen Description BLOOD HAND LEFT   Final   Special Requests BOTTLES DRAWN AEROBIC ONLY 6CC   Final   Culture  Setup Time     Final   Value: 03/30/2014 04:10     Performed at [facility]   Culture     Final   Value:        BLOOD CULTURE RECEIVED NO GROWTH TO DATE CULTURE WILL BE HELD FOR 5 DAYS BEFORE ISSUING A FINAL NEGATIVE REPORT     Performed at [facility]   Report Status PENDING   Incomplete  MRSA PCR SCREENING     Status: None   Collection Time    03/30/14  4:20 AM      Result Value Ref Range Status   MRSA by PCR NEGATIVE  NEGATIVE Final   Comment:            The GeneXpert MRSA Assay (FDA     approved for NASAL specimens     only), is one component of a     comprehensive MRSA colonization     surveillance program. It is not     intended to diagnose MRSA     infection nor to guide or     monitor treatment for     MRSA infections.  CULTURE, RESPIRATORY (NON-EXPECTORATED)     Status: None   Collection Time    03/30/14  4:45 AM      Result Value Ref Range Status   Specimen Description TRACHEAL ASPIRATE   Final   Special Requests NONE   Final   Gram Stain     Final   Value: MODERATE WBC PRESENT,BOTH PMN AND MONONUCLEAR     NO SQUAMOUS EPITHELIAL CELLS SEEN  ABUNDANT GRAM POSITIVE COCCI     IN PAIRS     [facility]   Culture     Final   Value: Non-Pathogenic Oropharyngeal-type Flora Isolated.     [facility]formed at [facility]   Report Status 04/01/2014 FINAL   Final    Coagulation Studies: No results found for this basename: LABPROT, INR,  in the last 72 hours  Urinalysis: No results found for this basename: COLORURINE, APPERANCEUR, LABSPEC, PHURINE, GLUCOSEU, HGBUR, BILIRUBINUR, KETONESUR, PROTEINUR, UROBILINOGEN, NITRITE, LEUKOCYTESUR,  in the last 72 hours    Imaging: Dg Chest Port 1 View  04/02/2014   CLINICAL DATA:  Endotracheal tube   EXAM: PORTABLE CHEST - 1 VIEW  COMPARISON:  DG CHEST 1V PORT dated 03/31/2014; DG CHEST 1V PORT dated 03/30/2014; DG CHEST 1V PORT dated 03/29/2014; DG CHEST 1V PORT dated 03/08/2014; DG CHEST 2 VIEW dated 02/11/2014  FINDINGS: Grossly unchanged enlarged cardiac silhouette and mediastinal contours. Stable positioning of support apparatus. No pneumothorax. Marked improved aeration of the lungs with persistent bilateral infrahilar opacities, left greater than right. Trace a sided effusion is not excluded. No definite evidence of edema. No pneumothorax. Unchanged bones.  IMPRESSION: 1.  Stable positioning of support apparatus.  No pneumothorax. 2. Marked improved aeration of the lungs suggests resolving edema and atelectasis.   Electronically Signed   By: Simonne ComeJohn  Watts M.D.   On: 04/02/2014 07:52     Medications:   . dextrose 20 mL/hr at 04/02/14 1219  . dextrose    . dialysis replacement fluid (prismasate) 500 mL/hr at 04/02/14 0339  . dialysis replacement fluid (prismasate) 300 mL/hr at 04/01/14 1500  . dialysate (PRISMASATE) 2,000 mL/hr at 04/02/14 1252   . antiseptic oral rinse  15 mL Mouth Rinse BID  . carvedilol  25 mg Per Tube BID WC  . chlorhexidine  15 mL Mouth Rinse BID  . chlorhexidine  15 mL Mouth Rinse BID  . darbepoetin (ARANESP) injection - DIALYSIS  100 mcg Intravenous Q Wed-HD  . hydrALAZINE  50 mg Per Tube TID  . levETIRAcetam  500 mg Intravenous Q12H  . levofloxacin (LEVAQUIN) IV  250 mg Intravenous Q24H  . pantoprazole sodium  40 mg Per Tube Daily   sodium chloride, dextrose, heparin, sodium chloride  Assessment/ Plan:  focus of parenchymal hemorrhage at the junction of the left temporal and left parietal lobes which measures 1.6 x 1.6 cm.Mild surrounding edema but no mass effect or IV extension.  1. ESRD (non-compliant with dialysis)  on CVVHDF  2. Electrolytes Hyperkalemia resolved  3. Metabolic acidosis resolved  4. Anemia Will follow consider transfusion if less than 8  5.  Bone mineral Will assess calcium 7.8 and phosphorus 3.5  Albumin 2.1 6. HTN Improved control  Plan CRRT for several days prior to transitioning to intermittent dialysis   Discussed prognosis with family although neurology are cautiously hopeful   LOS: 4 Garnetta BuddyMartin W Mcdonald Reiling @TODAY @1 :20 PM

## 2014-04-02 NOTE — Progress Notes (Signed)
Hypoglycemic Event  CBG: 52  Treatment: D50 IV 50 mL  Symptoms: None  Follow-up CBG: Time:1634  CBG Result:131  Possible Reasons for Event: Inadequate meal intake  Comments/MD notified:Ramaswamy    [place] Throckmorton O Jhaden Pizzuto  Remember to initiate Hypoglycemia Order Set & complete

## 2014-04-03 ENCOUNTER — Inpatient Hospital Stay (HOSPITAL_COMMUNITY): Payer: Medicaid Other

## 2014-04-03 DIAGNOSIS — I679 Cerebrovascular disease, unspecified: Secondary | ICD-10-CM

## 2014-04-03 LAB — GLUCOSE, CAPILLARY
GLUCOSE-CAPILLARY: 104 mg/dL — AB (ref 70–99)
GLUCOSE-CAPILLARY: 108 mg/dL — AB (ref 70–99)
GLUCOSE-CAPILLARY: 111 mg/dL — AB (ref 70–99)
GLUCOSE-CAPILLARY: 126 mg/dL — AB (ref 70–99)
Glucose-Capillary: 87 mg/dL (ref 70–99)
Glucose-Capillary: 95 mg/dL (ref 70–99)

## 2014-04-03 LAB — BASIC METABOLIC PANEL
BUN: 14 mg/dL (ref 6–23)
CO2: 26 mEq/L (ref 19–32)
CREATININE: 2.59 mg/dL — AB (ref 0.50–1.35)
Calcium: 8 mg/dL — ABNORMAL LOW (ref 8.4–10.5)
Chloride: 101 mEq/L (ref 96–112)
GFR, EST AFRICAN AMERICAN: 34 mL/min — AB (ref >90)
GFR, EST NON AFRICAN AMERICAN: 29 mL/min — AB (ref >90)
Glucose, Bld: 104 mg/dL — ABNORMAL HIGH (ref 70–99)
Potassium: 4 mEq/L (ref 3.7–5.3)
Sodium: 136 mEq/L — ABNORMAL LOW (ref 137–147)

## 2014-04-03 LAB — CBC WITH DIFFERENTIAL/PLATELET
BASOS ABS: 0 10*3/uL (ref 0.0–0.1)
Basophils Relative: 0 % (ref 0–1)
EOS ABS: 0.2 10*3/uL (ref 0.0–0.7)
Eosinophils Relative: 3 % (ref 0–5)
HCT: 25.4 % — ABNORMAL LOW (ref 39.0–52.0)
HEMOGLOBIN: 8.1 g/dL — AB (ref 13.0–17.0)
Lymphocytes Relative: 19 % (ref 12–46)
Lymphs Abs: 1.1 10*3/uL (ref 0.7–4.0)
MCH: 29.6 pg (ref 26.0–34.0)
MCHC: 31.9 g/dL (ref 30.0–36.0)
MCV: 92.7 fL (ref 78.0–100.0)
MONOS PCT: 10 % (ref 3–12)
Monocytes Absolute: 0.6 10*3/uL (ref 0.1–1.0)
Neutro Abs: 4.1 10*3/uL (ref 1.7–7.7)
Neutrophils Relative %: 68 % (ref 43–77)
Platelets: 172 10*3/uL (ref 150–400)
RBC: 2.74 MIL/uL — ABNORMAL LOW (ref 4.22–5.81)
RDW: 17 % — ABNORMAL HIGH (ref 11.5–15.5)
WBC: 6 10*3/uL (ref 4.0–10.5)

## 2014-04-03 LAB — PHOSPHORUS: Phosphorus: 2.8 mg/dL (ref 2.3–4.6)

## 2014-04-03 LAB — RENAL FUNCTION PANEL
ALBUMIN: 2.1 g/dL — AB (ref 3.5–5.2)
BUN: 12 mg/dL (ref 6–23)
CHLORIDE: 99 meq/L (ref 96–112)
CO2: 25 meq/L (ref 19–32)
Calcium: 7.4 mg/dL — ABNORMAL LOW (ref 8.4–10.5)
Creatinine, Ser: 2.49 mg/dL — ABNORMAL HIGH (ref 0.50–1.35)
GFR calc Af Amer: 36 mL/min — ABNORMAL LOW (ref >90)
GFR calc non Af Amer: 31 mL/min — ABNORMAL LOW (ref >90)
Glucose, Bld: 112 mg/dL — ABNORMAL HIGH (ref 70–99)
POTASSIUM: 4 meq/L (ref 3.7–5.3)
Phosphorus: 2.4 mg/dL (ref 2.3–4.6)
Sodium: 134 mEq/L — ABNORMAL LOW (ref 137–147)

## 2014-04-03 LAB — MAGNESIUM: MAGNESIUM: 2.3 mg/dL (ref 1.5–2.5)

## 2014-04-03 LAB — ALBUMIN: Albumin: 2.2 g/dL — ABNORMAL LOW (ref 3.5–5.2)

## 2014-04-03 MED ORDER — PHENYTOIN SODIUM 50 MG/ML IJ SOLN
100.0000 mg | Freq: Three times a day (TID) | INTRAMUSCULAR | Status: DC
  Administered 2014-04-03 – 2014-04-05 (×6): 100 mg via INTRAVENOUS
  Filled 2014-04-03 (×9): qty 2

## 2014-04-03 MED ORDER — PHENYTOIN SODIUM 50 MG/ML IJ SOLN
100.0000 mg | Freq: Three times a day (TID) | INTRAMUSCULAR | Status: DC
  Filled 2014-04-03 (×3): qty 2

## 2014-04-03 MED ORDER — DEXMEDETOMIDINE HCL IN NACL 400 MCG/100ML IV SOLN
0.4000 ug/kg/h | INTRAVENOUS | Status: DC
  Administered 2014-04-03: 0.7 ug/kg/h via INTRAVENOUS
  Administered 2014-04-03: 0.426 ug/kg/h via INTRAVENOUS
  Administered 2014-04-03 – 2014-04-04 (×2): 0.7 ug/kg/h via INTRAVENOUS
  Administered 2014-04-04: 1 ug/kg/h via INTRAVENOUS
  Administered 2014-04-04: 0.6 ug/kg/h via INTRAVENOUS
  Administered 2014-04-05: 0.7 ug/kg/h via INTRAVENOUS
  Administered 2014-04-05: 0.9 ug/kg/h via INTRAVENOUS
  Filled 2014-04-03: qty 50
  Filled 2014-04-03: qty 100
  Filled 2014-04-03: qty 50
  Filled 2014-04-03 (×2): qty 100
  Filled 2014-04-03: qty 50
  Filled 2014-04-03 (×3): qty 100
  Filled 2014-04-03: qty 50

## 2014-04-03 MED ORDER — SODIUM CHLORIDE 0.9 % IV SOLN
500.0000 mg | Freq: Once | INTRAVENOUS | Status: AC
  Administered 2014-04-03: 500 mg via INTRAVENOUS
  Filled 2014-04-03: qty 10

## 2014-04-03 MED ORDER — HYDRALAZINE HCL 50 MG PO TABS
100.0000 mg | ORAL_TABLET | Freq: Four times a day (QID) | ORAL | Status: DC
  Administered 2014-04-03 – 2014-04-05 (×7): 100 mg via ORAL
  Filled 2014-04-03 (×11): qty 2

## 2014-04-03 MED ORDER — VITAL HIGH PROTEIN PO LIQD
1000.0000 mL | ORAL | Status: DC
  Filled 2014-04-03: qty 1000

## 2014-04-03 NOTE — Progress Notes (Signed)
Very agitated per RN report  Plan precedex gtt

## 2014-04-03 NOTE — Progress Notes (Signed)
LTM EEG started, Dr Amada Jupiter made aware.

## 2014-04-03 NOTE — Progress Notes (Signed)
Renal panel results called to Dr. Briant Cedar.

## 2014-04-03 NOTE — Progress Notes (Addendum)
Stroke Team Progress Note  HISTORY Matthew Beard is a 38 y.o. 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  Patient was home with his children when suddenly collapsed to the ground and had a generalized convulsion. EMS called and patient brought to the ED where He was confused with SBP 249.  Urgent CT brain revealed a focus of parenchymal hemorrhage at the junction of the left temporal and left parietal lobes which measures 1.6 x 1.6 cm.Mild surrounding edema but no mass effect or IV extension.  PTT 37. INR 1.30. Platelets 198  Patient was not protecting his airway and was intubated in the ED.  Loaded with 1 gram IV keppra. Nicardipine infusion started.   Date last known well: 03/29/14  Time last known well: unclear  tPA Given: no, ICH   SUBJECTIVE Family member present this morning/neice. The patient is extubated, but he is minimally verbal. Not following commands. On HD this AM  OBJECTIVE Most recent Vital Signs: Filed Vitals:   04/03/14 0700 04/03/14 0740 04/03/14 0800 04/03/14 0900  BP: 131/83  166/113 176/108  Pulse: 68  79 79  Temp:  98.1 F (36.7 C)    TempSrc:  Oral    Resp: 14  16 22   Height:      Weight:      SpO2: 99%  100% 100%   CBG (last 3)   Recent Labs  04/03/14 0025 04/03/14 0358 04/03/14 0729  GLUCAP 104* 87 95    IV Fluid Intake:   . dextrose 50 mL/hr at 04/02/14 1522  . dextrose    . dialysis replacement fluid (prismasate) 500 mL/hr at 04/03/14 0240  . dialysis replacement fluid (prismasate) 300 mL/hr at 04/01/14 1500  . dialysate (PRISMASATE) 2,000 mL/hr at 04/03/14 0240    MEDICATIONS  . amLODipine  10 mg Per Tube Daily  . antiseptic oral rinse  15 mL Mouth Rinse BID  . carvedilol  25 mg Per Tube BID WC  . chlorhexidine  15 mL Mouth Rinse  BID  . chlorhexidine  15 mL Mouth Rinse BID  . darbepoetin (ARANESP) injection - DIALYSIS  100 mcg Intravenous Q Wed-HD  . feeding supplement (VITAL HIGH PROTEIN)  1,000 mL Per Tube Q24H  . hydrALAZINE  10-20 mg Intravenous Q6H  . levETIRAcetam  500 mg Intravenous Q12H  . levofloxacin (LEVAQUIN) IV  250 mg Intravenous Q24H  . pantoprazole sodium  40 mg Per Tube Daily   PRN:  sodium chloride, dextrose, heparin, sodium chloride  Diet:  NPO no liquids Activity:  Bedrest DVT Prophylaxis:  SCDs  CLINICALLY SIGNIFICANT STUDIES Basic Metabolic Panel:   Recent Labs Lab 04/02/14 0309 04/02/14 1600 04/03/14 0300  NA 137 139 136*  K 4.1 4.3 4.0  CL 102 102 101  CO2 23 25 26   GLUCOSE 74 210* 104*  BUN 26* 18 14  CREATININE 3.64* 2.86* 2.59*  CALCIUM 7.8* 7.9* 8.0*  MG 2.3  --  2.3  PHOS 3.5 3.0 2.8   Liver Function Tests:   Recent Labs Lab 03/29/14 2255  04/02/14 0309 04/02/14 1600  AST 20  --   --   --   ALT 19  --   --   --   ALKPHOS 80  --   --   --   BILITOT 0.5  --   --   --  PROT 7.8  --   --   --   ALBUMIN 2.9*  < > 2.1* 2.1*  < > = values in this interval not displayed. CBC:   Recent Labs Lab 04/02/14 0309 04/03/14 0300  WBC 8.1 6.0  NEUTROABS 5.8 4.1  HGB 8.0* 8.1*  HCT 25.1* 25.4*  MCV 90.6 92.7  PLT 176 172   Coagulation:   Recent Labs Lab 03/30/14 0052  LABPROT 15.9*  INR 1.30   Cardiac Enzymes:   Recent Labs Lab 03/30/14 1555 03/30/14 2330 03/31/14 0430 04/02/14 0310  CKTOTAL  --   --  75 176  CKMB  --   --  2.1 3.5  TROPONINI 0.51* 0.80* 0.71*  --    Urinalysis:   Recent Labs Lab 03/29/14 2251  COLORURINE YELLOW  LABSPEC 1.019  PHURINE 6.5  GLUCOSEU NEGATIVE  HGBUR LARGE*  BILIRUBINUR NEGATIVE  KETONESUR NEGATIVE  PROTEINUR >300*  UROBILINOGEN 1.0  NITRITE NEGATIVE  LEUKOCYTESUR NEGATIVE   Lipid Panel    Component Value Date/Time   CHOL 187 01/22/2012 0515   TRIG 236* 04/02/2014 0309   HDL 38* 01/22/2012 0515    CHOLHDL 4.9 01/22/2012 0515   VLDL 25 01/22/2012 0515   LDLCALC 124* 01/22/2012 0515   HgbA1C  Lab Results  Component Value Date   HGBA1C 5.3 03/08/2014    Urine Drug Screen:     Component Value Date/Time   LABOPIA NONE DETECTED 03/29/2014 2251   LABOPIA NEGATIVE 03/08/2014 2108   COCAINSCRNUR NONE DETECTED 03/29/2014 2251   COCAINSCRNUR NEGATIVE 03/08/2014 2108   LABBENZ NONE DETECTED 03/29/2014 2251   LABBENZ NEGATIVE 03/08/2014 2108   AMPHETMU NONE DETECTED 03/29/2014 2251   AMPHETMU NEGATIVE 03/08/2014 2108   THCU NONE DETECTED 03/29/2014 2251   LABBARB NONE DETECTED 03/29/2014 2251    Alcohol Level: No results found for this basename: ETH,  in the last 168 hours  Ct Head Wo Contrast 03/29/2014    Significantly limited exam by patient motion artifact.  A focus of hemorrhage is noted as described above at the junction of the left temporal and parietal lobes. Mild surrounding edema is noted.      Dg Chest Port 1 View 03/30/2014 Endotracheal tube as described.  Increasing bilateral infiltrative changes.   Electronically Signed   By: Alcide CleverMark  Lukens M.D.   On: 03/30/2014 00:21   Dg Chest Port 1 View 03/29/2014    Cardiac enlargement.  No evidence of active pulmonary disease.    CT Angio of head 03/30/2014 1. No intracranial aneurysm identified. 2. Mild anterior and posterior circulation branches vessel irregularity, suggestive of mild intracranial atherosclerosis. No evidence of significant proximal stenosis. 3. Unchanged, acute left temporal lobe parenchymal hemorrhage.  EEG Abnormalities:  1) Triphasic waves  2) generalized slow activity  Clinical Interpretation: This EEG is consistent with a generalized nonspecific cerebral dysfunction. Triphasic waves, nonspecific, can be associated with metabolic encephalopathy such as renal failure.    2D Echocardiogram  Left ventricle: The cavity size was normal. Wall thickness was increased in a pattern of severe LVH.  Systolic function was  severely reduced. The estimated ejection fraction was in the range of 25% to 30%.  Severe hypokinesis of the entire myocardium.   Carotid Doppler  pending  CXR   IMPRESSION:  1. Stable positioning of support apparatus. No pneumothorax.  2. Marked improved aeration of the lungs suggests resolving edema  and atelectasis.  EKG  Normal sinus rhythm rate 96 beats per minute.  For complete results  please see formal report.   Therapy Recommendations pending  Physical Exam  General: The patient is alert, unable to cooperate significantly at the time of the examination.  Skin: No significant peripheral edema is noted.   Neurologic Exam  Mental status: The patient is alert, not responding well.  Cranial nerves: Facial symmetry is present. Speech is whispery, difficult to understand. He will blink to threat bilaterally, will not track objects well.  Motor: The patient has symmetric motor tone on all 4's, the patient is not following commands for full motor testing, no obvious hemiparesis. He has intermittent tremors of the arms bilaterally.  Sensory examination: Pain stimulation appears to result in a symmetric response on the arms and legs.  Coordination: The patient could not cooperate for cerebellar testing.  Gait and station: The gait could not be tested.  Reflexes: Deep tendon reflexes are symmetric, but are hyper.   ASSESSMENT [name] is a 38 y.o. male presenting with a witnessed seizure and collapse. TPA was not initiated secondary to hemorrhage. Head CT - a focus of hemorrhage is noted at the junction of the left temporal and parietal lobes.On no atherothrombotics prior to admission. Now on no atherothrombotics for secondary stroke prevention due to hemorrhagic stroke. Patient with resultant unresponsiveness. Stroke work up underway.   Hypertension  History of noncompliance with medications  ESRD - HD  Nonischemic cardiomyopathy. Ejection fraction  25-30%.  History of lacunar infarcts  Anemia   Hospital day # 5  The patient is not responding well, some tremors seen, may have a concern for subclinical seizures. At baseline, the patient is cognitively slow.  TREATMENT/PLAN  No antithrombotics at this time for secondary stroke prevention due to hemorrhagic stroke.  Await carotid Dopplers  On Keppra. EEG - generalized nonspecific cerebral dysfunction.   Keep systolic blood pressure less than 160  Add dilantin  Prolonged EEG study     04/03/2014, 9:09 AM   York Spaniel [phone]      To contact Stroke Continuity provider, please refer to [email or web address]. After hours, contact General Neurology

## 2014-04-03 NOTE — Progress Notes (Signed)
PULMONARY / CRITICAL CARE MEDICINE   Name: Matthew Beard MRN: [id number] DOB: Jun 27, 1976   PCP [name], MD   ADMISSION DATE:  03/29/2014 CONSULTATION DATE:  03/30/14  REFERRING MD :  EDP PRIMARY SERVICE: PCCM  CHIEF COMPLAINT:  Cerebral hemorrhage  BRIEF PATIENT DESCRIPTION:   . Matthew Beard is a 38 y.o. M with PMH significant for ESRD (non-compliant with dialysis), HTN, anemia, NICM, CHF, SOB, who presented to ED on 5/11 after he was at home and per ED notes, fell backwards and began to shake.  His fiances children witnessed the event and called her.  He was in his USOH prior to this episode although fiance notes that his health has not been very good of late.   In ED, initial BP was 209/142 and got as high as 249/144.  Head CT was obtained and revealed a small focal hemorrhage at junction of left parietal and temporal lobes. Pt was intubated due to agitation/attempting to pull at lines/confusion/concern for lack of ability to protect airway. Per pt's fiance, he is non-compliant with his home meds as well as dialysis and she believes that he was supposed to have dialysis today, however, missed it. Of note, pt was recently discharged from [facility] on 4/28 after he was admitted and treated for volume overload and found to have ESRD.   SIGNIFICANT EVENTS / STUDIES:  5/11 - fell backwards at home and began shaking, questionable loss of consciousness. 5/12 Head CT >>> small hemorrhage at junction of left temporal and parietal lobes.  Intubated by EDP for airway protection.  LINES / TUBES: OETT 5/12 >>>5/15 OGT 5/12 >>> Foley 5/12 >>> Rt IJ Trialysis cath 03/30/14 >>  CULTURES: Sputum 5/12 >>>   ANTIBIOTICS: Vanc 5/11 >>5/11 Maxipime 5/11 >>>5/12 Imipenem  5/12 >>5/13 Levaquin 5/13 (aspn) >>     EVENTS   03/30/14: Dr Pearlean Brownie recommends SBP 120-160 as goal. Patient very hypertensive and agitated. CT angio head shows no IC aneurysm but has acute left temporal lobe hge. : Remains  hypertensive on propofol and cardene.  Intubated by EDP.    03/31/14: MRI with 1.8cm acute left temporal lobe bleed with multiple micro infarcts and bleed. On diprivan: RASS -3 but doing sbt.   Off cardene. On serveral home bp meds - sbp 111 . ECHO shows sudden drop in EF from 50% in march 2015 tor 25% now. Global hypokinesis   04/01/14: Cards reckons hypertensive "blown out" cardiomyopathy. On CVVH ongoing. Off sedation: not awake enough for extubation though doing SBT  04/02/14: Palliative care meeting: family understands poor long term prognosis. 43 year old son struggling will patient illness but understands gravity of situation. Full code for now including reintubation. Patient agitated but not following commands but good cough and gag. Ok from neuro stand point to extubate. Having hypoglycemia    SUBJECTIVE/OVERNIGHT/INTERVAL HX 04/03/14: Extubated 24h ago. Neur concerned for sub clinical seizures, repeat EEG in progress. Agitated intermittently with perioids of following commands. On d10 with sugars now 95  VITAL SIGNS: Temp:  [97.3 F (36.3 C)-98.5 F (36.9 C)] 98.1 F (36.7 C) (05/16 0740) Pulse Rate:  [52-85] 79 (05/16 0900) Resp:  [8-22] 22 (05/16 0900) BP: (131-225)/(83-131) 176/108 mmHg (05/16 0900) SpO2:  [91 %-100 %] 100 % (05/16 0900) FiO2 (%):  [40 %] 40 % (05/15 1300) Weight:  [80.8 kg (178 lb 2.1 oz)] 80.8 kg (178 lb 2.1 oz) (05/16 0500) HEMODYNAMICS:   VENTILATOR SETTINGS: Vent Mode:  [-] CPAP FiO2 (%):  [40 %]  40 % Set Rate:  [14 bmp] 14 bmp Vt Set:  [580 mL] 580 mL PEEP:  [5 cmH20] 5 cmH20 Pressure Support:  [5 cmH20] 5 cmH20 Plateau Pressure:  [14 cmH20] 14 cmH20 INTAKE / OUTPUT: Intake/Output     05/15 0701 - 05/16 0700 05/16 0701 - 05/17 0700   I.V. (mL/kg) 990.4 (12.3) 120 (1.5)   NG/GT 370 140   IV Piggyback 260    Total Intake(mL/kg) 1620.4 (20.1) 260 (3.2)   Urine (mL/kg/hr) 355 (0.2) 45 (0.2)   Other 784 (0.4) 194 (0.7)   Total Output 1139 239    Net +481.4 +21          PHYSICAL EXAMINATION: General: Young AA male, , hypertensive. Neuro: Currently drowsy HEENT: [place]/AT. PERRL, sclerae anicteric. Cardiovascular: tachy but regular, no M/R/G.  Lungs: CTA bilaterally, No W/R/R.  Abdomen: BS x 4, soft, NT/ND.  Musculoskeletal: No gross deformities, 2+ edema to LE's bilaterally.  Fistula noted LUE. Skin: Intact, warm, no rashes.    LABS: PULMONARY  Recent Labs Lab 03/29/14 2307 03/30/14 0122 03/30/14 0422 03/30/14 0559  PHART 7.463* 7.424 7.431 7.429  PCO2ART 28.4* 33.9* 31.4* 31.8*  PO2ART 69.0* 102.0* 46.6* 329.0*  HCO3 20.0 22.0 20.5 20.7  TCO2 21 23 21.5 21.7  O2SAT 93.0 98.0 77.2 100.0    CBC  Recent Labs Lab 04/01/14 0400 04/02/14 0309 04/03/14 0300  HGB 8.9* 8.0* 8.1*  HCT 26.8* 25.1* 25.4*  WBC 5.3 8.1 6.0  PLT 194 176 172    COAGULATION  Recent Labs Lab 03/30/14 0052  INR 1.30    CARDIAC    Recent Labs Lab 03/30/14 0637 03/30/14 1035 03/30/14 1555 03/30/14 2330 03/31/14 0430  TROPONINI 0.45* 0.56* 0.51* 0.80* 0.71*   No results found for this basename: PROBNP,  in the last 168 hours   CHEMISTRY  Recent Labs Lab 03/30/14 0637 03/31/14 0430  04/01/14 0400 04/01/14 1600 04/02/14 0309 04/02/14 1600 04/03/14 0300  NA 137 143  < > 141  140 136* 137 139 136*  K 5.4* 5.6*  < > 4.3  4.3 4.3 4.1 4.3 4.0  CL 101 103  < > 105  104 100 102 102 101  CO2 19 19  < > 21  21 22 23 25 26   GLUCOSE 93 70  < > 77  76 82 74 210* 104*  BUN 54* 66*  < > 46*  45* 35* 26* 18 14  CREATININE 7.43* 8.49*  < > 5.61*  5.67* 4.54* 3.64* 2.86* 2.59*  CALCIUM 8.3* 8.1*  < > 7.5*  7.5* 7.8* 7.8* 7.9* 8.0*  MG 2.2 2.3  --  2.3  --  2.3  --  2.3  PHOS 5.6* 6.5*  < > 5.1*  5.1* 4.6 3.5 3.0 2.8  < > = values in this interval not displayed. Estimated Creatinine Clearance: 39.5 ml/min (by C-G formula based on Cr of 2.59).   LIVER  Recent Labs Lab 03/29/14 2255 03/30/14 0052 03/31/14 1500  04/01/14 0400 04/01/14 1600 04/02/14 0309 04/02/14 1600  AST 20  --   --   --   --   --   --   ALT 19  --   --   --   --   --   --   ALKPHOS 80  --   --   --   --   --   --   BILITOT 0.5  --   --   --   --   --   --  PROT 7.8  --   --   --   --   --   --   ALBUMIN 2.9*  --  2.2* 1.8* 2.1* 2.1* 2.1*  INR  --  1.30  --   --   --   --   --      INFECTIOUS  Recent Labs Lab 03/29/14 2314 03/31/14 0450 04/02/14 0310  LATICACIDVEN 2.71* 0.8 0.8     ENDOCRINE CBG (last 3)   Recent Labs  04/03/14 0025 04/03/14 0358 04/03/14 0729  GLUCAP 104* 87 95         IMAGING x48h  Dg Chest Port 1 View  04/02/2014   CLINICAL DATA:  Endotracheal tube  EXAM: PORTABLE CHEST - 1 VIEW  COMPARISON:  DG CHEST 1V PORT dated 03/31/2014; DG CHEST 1V PORT dated 03/30/2014; DG CHEST 1V PORT dated 03/29/2014; DG CHEST 1V PORT dated 03/08/2014; DG CHEST 2 VIEW dated 02/11/2014  FINDINGS: Grossly unchanged enlarged cardiac silhouette and mediastinal contours. Stable positioning of support apparatus. No pneumothorax. Marked improved aeration of the lungs with persistent bilateral infrahilar opacities, left greater than right. Trace a sided effusion is not excluded. No definite evidence of edema. No pneumothorax. Unchanged bones.  IMPRESSION: 1.  Stable positioning of support apparatus.  No pneumothorax. 2. Marked improved aeration of the lungs suggests resolving edema and atelectasis.   Electronically Signed   By: Simonne Come M.D.   On: 04/02/2014 07:52   Dg Abd Portable 1v  04/02/2014   CLINICAL DATA:  Feeding tube placement  EXAM: PORTABLE ABDOMEN - 1 VIEW  COMPARISON:  None.  FINDINGS: Feeding tube tip is in the stomach. Bowel gas pattern is unremarkable. No free air is seen on this supine examination.  IMPRESSION: Feeding tube tip in stomach.   Electronically Signed   By: Bretta Bang M.D.   On: 04/02/2014 17:25      ASSESSMENT / PLAN:  NEUROLOGIC A:  ICH - likely secondary to significantly  elevated BP in the setting of hypertensive emergency. Questionable seizure. Urine tox negative.   - intermittently agitated. Neuro concerned for sub clinical seizures  P:   - Neurosurgery and neurology consult appreciated; EEG 04/03/2014 - Keppra. 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 BP now below goal of 120-160 sbp.   P:  - SBP goal 120-160 per neuro - cards mgmt appreciated - coreg and hydralazine  And norvasc to continue   PULMONARY A: VDRF - in the setting of ICH. S/p extubation 04/02/14 Concern for aspiration.   - no acute issues. On 2L O2  P:   - monitor  .  RENAL A:  ESRD   - renal doing cvvh in setting of IC Hge and VDRF   P:   - per Nephrology consult.   GASTROINTESTINAL A:   NPO P:   - SUP: Pantoprazole. - continue NPO for extubation  HEMATOLOGIC A:   No acute issues. P:  - VTE prophylaxis:  SCD's only. - CBC in AM.  INFECTIOUS A:   Concern for aspiration. P:   - RXt levaquin (total 7d abx); 04/06/14 - Monitor fever curve / WBC's.  ENDOCRINE A:   Having hypoglycemia 04/02/14 P:   -  D10 gtt @ 50cc/h - CBG's q4. - SSI.  GLOBAL  04/02/14: See detailed palliative care note; full code for now   The patient is critically ill with multiple organ systems failure and requires high complexity decision making for assessment and support, frequent evaluation and titration of therapies, application of advanced monitoring technologies and extensive interpretation of multiple databases.   Critical Care Time devoted to patient care services described in this note is  35 additional  Minutes.  Dr. Kalman ShanMurali Rebekha Diveley, M.D., [facility] IncF.C.C.P Pulmonary and Critical Care Medicine Staff Physician [facility] [place] [facility] Pager: [phone], If no  answer or between  15:00h - 7:00h: call [phone]  04/03/2014 10:13 AM

## 2014-04-03 NOTE — Evaluation (Signed)
Clinical/Bedside Swallow Evaluation Patient Details  Name: Matthew Beard Vinje MRN: [id number] Date of Birth: 1976/10/25  Today's Date: 04/03/2014 Time: 0900-0930 SLP Time Calculation (min): 30 min  Past Medical History:  Past Medical History  Diagnosis Date  . Hypertension   . CKD (chronic kidney disease), stage IV   . Normocytic anemia   . Pericardial effusion     Remote hx  . Cerebrovascular disease     Chronic lacunar infarcts  . NICM (nonischemic cardiomyopathy)   . CHF (congestive heart failure)   . Shortness of breath    Past Surgical History:  Past Surgical History  Procedure Laterality Date  . Av fistula placement Left 10/19/2013    Procedure: ARTERIOVENOUS (AV) FISTULA CREATION- LEFT RADIOCEPHALIC;  Surgeon: Larina Earthlyodd F Early, MD;  [facility];  Service: Vascular;  Laterality: Left;   HPI:  Matthew Beard is a 38 y.o. 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  Patient was home with his children when suddenly collapsed to the ground and had a generalized convulsion. EMS called and patient brought to the ED where He was confused with SBP 249.  Pt was extubated on 04/02/14, but was not medically stable when BSE was attempted on this date.   Assessment / Plan / Recommendation Clinical Impression   Pt exhibited s/s of aspiration with thin via tsp including immediate cough and wet vocal quality; overall oral phase difficulty with all consistencies included decreased lingual manipulation/propulsion, left lingual residue with minimal pocketing of solid consistency; as well as, slow but thorough mastication with solids. Pharyngeal phase difficulty included suspected delayed swallow and decreased laryngeal elevation via palpation. Nectar-thickened liquids via cup edge, small sips with  no s/s of aspiration present with multiple swallows.  Pt is at moderate risk for aspiration without use of nectar-thickened liquids and small sips/bites of mechanical soft diet (Dysphagia 3); an instrumental study may be indicated prior to upgrading diet further; fatigue cannot be taken into consideration with BSE, so ST should f/u to assess diet tolerance over time    Aspiration Risk  Moderate without swallowing precautions/modified diet    Diet Recommendation Dysphagia 3 (Mechanical Soft);Nectar-thick liquid (No straws)   Liquid Administration via: Cup (small sips only) Medication Administration: Whole meds with puree Supervision: Full supervision/cueing for compensatory strategies Compensations: Slow rate;Small sips/bites;Check for pocketing Postural Changes and/or Swallow Maneuvers: Seated upright 90 degrees;Upright 30-60 min after meal    Other  Recommendations Recommended Consults: MBS;FEES (may be indicated) Oral Care Recommendations: Oral care BID Other Recommendations: Order thickener from pharmacy   Follow Up Recommendations  Inpatient Rehab    Frequency and Duration min 3x week  2 weeks   Pertinent Vitals/Pain BP elevated; afebrile    SLP Swallow Goals  see care plan   Swallow Study Prior Functional Status   independent at home    General Date of Onset: 03/29/14 HPI: Matthew Beard Boom is a 38 y.o. 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  Patient was home with his children when suddenly collapsed to the ground and had a generalized convulsion. EMS called and patient brought to the ED where He was confused  with SBP 249.  Pt was extubated on 04/02/14, but was not medically stable when BSE was attempted.   Type of Study: Bedside swallow evaluation Previous Swallow  Assessment: BSE attempted 04/02/14, but pt not medically stable for po intake Diet Prior to this Study: NPO Temperature Spikes Noted: No Respiratory Status: Nasal cannula History of Recent Intubation: Yes Length of Intubations (days): 4 days Date extubated: 04/02/14 Behavior/Cognition: Alert;Confused;Requires cueing;Distractible;Decreased sustained attention Oral Cavity - Dentition: Adequate natural dentition Self-Feeding Abilities: Total assist Patient Positioning: Upright in bed Baseline Vocal Quality: Low vocal intensity;Breathy Volitional Cough: Strong Volitional Swallow: Able to elicit    Oral/Motor/Sensory Function Overall Oral Motor/Sensory Function: Impaired Labial ROM: Other (Comment) (unable to follow directions to complete) Labial Symmetry: Within Functional Limits Labial Strength: Reduced Lingual ROM: Other (Comment) (reduced overall) Lingual Symmetry: Other (Comment) (could not complete task to assess) Lingual Strength: Reduced Lingual Sensation: Reduced Facial Strength: Reduced Facial Sensation: Reduced Velum:  (unable to fully assess)   Ice Chips Ice chips: Impaired Presentation: Spoon Oral Phase Impairments: Reduced lingual movement/coordination;Impaired anterior to posterior transit Oral Phase Functional Implications: Prolonged oral transit Pharyngeal Phase Impairments: Suspected delayed Swallow   Thin Liquid Thin Liquid: Impaired Presentation: Spoon Oral Phase Impairments: Reduced lingual movement/coordination;Impaired anterior to posterior transit Oral Phase Functional Implications: Prolonged oral transit Pharyngeal  Phase Impairments: Suspected delayed Swallow;Cough - Immediate;Wet Vocal Quality    Nectar Thick Nectar Thick Liquid: Impaired Presentation: Cup;Spoon Oral Phase Impairments: Reduced lingual movement/coordination;Impaired anterior to posterior transit Oral phase functional implications: Prolonged oral transit Pharyngeal Phase Impairments:  Suspected delayed Swallow   Honey Thick Honey Thick Liquid: Not tested   Puree Puree: Impaired Presentation: Spoon Oral Phase Impairments: Reduced lingual movement/coordination;Impaired anterior to posterior transit Oral Phase Functional Implications: Prolonged oral transit Pharyngeal Phase Impairments: Suspected delayed Swallow   Solid       Solid: Impaired Presentation:  (Fed by SLP) Oral Phase Impairments: Reduced lingual movement/coordination;Impaired anterior to posterior transit;Impaired mastication Oral Phase Functional Implications: Left lateral sulci pocketing;Oral residue Pharyngeal Phase Impairments: Multiple swallows Other Comments: slow but thorough mastication       Rod Mae, M.S., CCC-SLP 04/03/2014,10:28 AM

## 2014-04-03 NOTE — Progress Notes (Signed)
[facility] ROUNDING NOTE   Subjective:   Interval History: patient is still restless and agitated but slightly better. Still have focal seizures and discussed case with Dr Anne Hahn.   Objective:  Vital signs in last 24 hours:  Temp:  [97.3 F (36.3 C)-98.5 F (36.9 C)] 98.1 F (36.7 C) (05/16 0740) Pulse Rate:  [52-85] 71 (05/16 1000) Resp:  [9-22] 12 (05/16 1000) BP: (131-225)/(83-131) 157/89 mmHg (05/16 1000) SpO2:  [91 %-100 %] 97 % (05/16 1000) FiO2 (%):  [40 %] 40 % (05/15 1300) Weight:  [80.8 kg (178 lb 2.1 oz)] 80.8 kg (178 lb 2.1 oz) (05/16 0500)  Weight change: 0.3 kg (10.6 oz) Filed Weights   04/01/14 0437 04/02/14 0344 04/03/14 0500  Weight: 83.3 kg (183 lb 10.3 oz) 80.5 kg (177 lb 7.5 oz) 80.8 kg (178 lb 2.1 oz)    Intake/Output: I/O last 3 completed shifts: In: 1835.4 [I.V.:1100.4; NG/GT:370; IV Piggyback:365] Out: 1496 [Urine:420; Other:1076]   Intake/Output this shift:  Total I/O In: 620 [I.V.:240; NG/GT:270; IV Piggyback:110] Out: 434 [Urine:45; Other:389]  Alert but does not follow commands CVS- RRR RS- CTA ABD- BS present soft non-distended EXT- no edema   Basic Metabolic Panel:  Recent Labs Lab 03/30/14 0637 03/31/14 0430  04/01/14 0400 04/01/14 1600 04/02/14 0309 04/02/14 1600 04/03/14 0300  NA 137 143  < > 141  140 136* 137 139 136*  K 5.4* 5.6*  < > 4.3  4.3 4.3 4.1 4.3 4.0  CL 101 103  < > 105  104 100 102 102 101  CO2 19 19  < > 21  21 22 23 25 26   GLUCOSE 93 70  < > 77  76 82 74 210* 104*  BUN 54* 66*  < > 46*  45* 35* 26* 18 14  CREATININE 7.43* 8.49*  < > 5.61*  5.67* 4.54* 3.64* 2.86* 2.59*  CALCIUM 8.3* 8.1*  < > 7.5*  7.5* 7.8* 7.8* 7.9* 8.0*  MG 2.2 2.3  --  2.3  --  2.3  --  2.3  PHOS 5.6* 6.5*  < > 5.1*  5.1* 4.6 3.5 3.0 2.8  < > = values in this interval not displayed.  Liver Function Tests:  Recent Labs Lab 03/29/14 2255 03/31/14 1500 04/01/14 0400 04/01/14 1600 04/02/14 0309  04/02/14 1600  AST 20  --   --   --   --   --   ALT 19  --   --   --   --   --   ALKPHOS 80  --   --   --   --   --   BILITOT 0.5  --   --   --   --   --   PROT 7.8  --   --   --   --   --   ALBUMIN 2.9* 2.2* 1.8* 2.1* 2.1* 2.1*   No results found for this basename: LIPASE, AMYLASE,  in the last 168 hours  Recent Labs Lab 03/29/14 2255  AMMONIA 16    CBC:  Recent Labs Lab 03/29/14 2255 03/30/14 0637 03/31/14 0430 04/01/14 0400 04/02/14 0309 04/03/14 0300  WBC 9.7 8.5 6.6 5.3 8.1 6.0  NEUTROABS 8.4*  --  4.9 3.8 5.8 4.1  HGB 8.4* 8.0* 8.1* 8.9* 8.0* 8.1*  HCT 25.9* 24.7* 25.2* 26.8* 25.1* 25.4*  MCV 91.5 90.5 89.7 89.0 90.6 92.7  PLT 198 212 221 194 176 172    Cardiac Enzymes:  Recent  Labs Lab 03/30/14 [id number] 03/30/14 1035 03/30/14 1555 03/30/14 2330 03/31/14 0430 04/02/14 0310  CKTOTAL  --   --   --   --  75 176  CKMB  --   --   --   --  2.1 3.5  TROPONINI 0.45* 0.56* 0.51* 0.80* 0.71*  --     BNP: No components found with this basename: POCBNP,   CBG:  Recent Labs Lab 04/02/14 1633 04/02/14 2017 04/03/14 0025 04/03/14 0358 04/03/14 0729  GLUCAP 131* 81 104* 87 95    Microbiology: Results for orders placed during the hospital encounter of 03/29/14  URINE CULTURE     Status: None   Collection Time    03/29/14 10:51 PM      Result Value Ref Range Status   Specimen Description URINE, CATHETERIZED   Final   Special Requests NONE   Final   Culture  Setup Time     Final   Value: 03/29/2014 23:32     Performed at [facility]   Colony Count     Final   Value: NO GROWTH     [facility]formed at [facility]   Culture     Final   Value: NO GROWTH     Performed at [facility]   Report Status 03/31/2014 FINAL   Final  CULTURE, BLOOD (ROUTINE X 2)     Status: None   Collection Time    03/29/14 10:55 PM      Result Value Ref Range Status   Specimen Description BLOOD ARM RIGHT   Final   Special Requests BOTTLES DRAWN AEROBIC AND  ANAEROBIC 5CC EA   Final   Culture  Setup Time     Final   Value: 03/30/2014 04:10     Performed at [facility]   Culture     Final   Value:        BLOOD CULTURE RECEIVED NO GROWTH TO DATE CULTURE WILL BE HELD FOR 5 DAYS BEFORE ISSUING A FINAL NEGATIVE REPORT     Performed at [facility]   Report Status PENDING   Incomplete  CULTURE, BLOOD (ROUTINE X 2)     Status: None   Collection Time    03/29/14 11:03 PM      Result Value Ref Range Status   Specimen Description BLOOD HAND LEFT   Final   Special Requests BOTTLES DRAWN AEROBIC ONLY 6CC   Final   Culture  Setup Time     Final   Value: 03/30/2014 04:10     Performed at [facility]   Culture     Final   Value:        BLOOD CULTURE RECEIVED NO GROWTH TO DATE CULTURE WILL BE HELD FOR 5 DAYS BEFORE ISSUING A FINAL NEGATIVE REPORT     Performed at [facility]   Report Status PENDING   Incomplete  MRSA PCR SCREENING     Status: None   Collection Time    03/30/14  4:20 AM      Result Value Ref Range Status   MRSA by PCR NEGATIVE  NEGATIVE Final   Comment:            The GeneXpert MRSA Assay (FDA     approved for NASAL specimens     only), is one component of a     comprehensive MRSA colonization     surveillance program. It is not     intended to  diagnose MRSA     infection nor to guide or     monitor treatment for     MRSA infections.  CULTURE, RESPIRATORY (NON-EXPECTORATED)     Status: None   Collection Time    03/30/14  4:45 AM      Result Value Ref Range Status   Specimen Description TRACHEAL ASPIRATE   Final   Special Requests NONE   Final   Gram Stain     Final   Value: MODERATE WBC PRESENT,BOTH PMN AND MONONUCLEAR     NO SQUAMOUS EPITHELIAL CELLS SEEN     ABUNDANT GRAM POSITIVE COCCI     IN PAIRS     Performed at [facility]   Culture     Final   Value: Non-Pathogenic Oropharyngeal-type Flora Isolated.     Performed at [facility]   Report Status 04/01/2014 FINAL    Final    Coagulation Studies: No results found for this basename: LABPROT, INR,  in the last 72 hours  Urinalysis: No results found for this basename: COLORURINE, APPERANCEUR, LABSPEC, PHURINE, GLUCOSEU, HGBUR, BILIRUBINUR, KETONESUR, PROTEINUR, UROBILINOGEN, NITRITE, LEUKOCYTESUR,  in the last 72 hours    Imaging: Dg Chest Port 1 View  04/02/2014   CLINICAL DATA:  Endotracheal tube  EXAM: PORTABLE CHEST - 1 VIEW  COMPARISON:  DG CHEST 1V PORT dated 03/31/2014; DG CHEST 1V PORT dated 03/30/2014; DG CHEST 1V PORT dated 03/29/2014; DG CHEST 1V PORT dated 03/08/2014; DG CHEST 2 VIEW dated 02/11/2014  FINDINGS: Grossly unchanged enlarged cardiac silhouette and mediastinal contours. Stable positioning of support apparatus. No pneumothorax. Marked improved aeration of the lungs with persistent bilateral infrahilar opacities, left greater than right. Trace a sided effusion is not excluded. No definite evidence of edema. No pneumothorax. Unchanged bones.  IMPRESSION: 1.  Stable positioning of support apparatus.  No pneumothorax. 2. Marked improved aeration of the lungs suggests resolving edema and atelectasis.   Electronically Signed   By: Simonne Come M.D.   On: 04/02/2014 07:52   Dg Abd Portable 1v  04/02/2014   CLINICAL DATA:  Feeding tube placement  EXAM: PORTABLE ABDOMEN - 1 VIEW  COMPARISON:  None.  FINDINGS: Feeding tube tip is in the stomach. Bowel gas pattern is unremarkable. No free air is seen on this supine examination.  IMPRESSION: Feeding tube tip in stomach.   Electronically Signed   By: Bretta Bang M.D.   On: 04/02/2014 17:25     Medications:   . dextrose 50 mL/hr at 04/02/14 1522  . dextrose    . dialysis replacement fluid (prismasate) 500 mL/hr at 04/03/14 1031  . dialysis replacement fluid (prismasate) 300 mL/hr at 04/03/14 0930  . dialysate (PRISMASATE) 2,000 mL/hr at 04/03/14 0240   . amLODipine  10 mg Per Tube Daily  . antiseptic oral rinse  15 mL Mouth Rinse BID  .  carvedilol  25 mg Per Tube BID WC  . chlorhexidine  15 mL Mouth Rinse BID  . chlorhexidine  15 mL Mouth Rinse BID  . darbepoetin (ARANESP) injection - DIALYSIS  100 mcg Intravenous Q Wed-HD  . feeding supplement (VITAL HIGH PROTEIN)  1,000 mL Per Tube Q24H  . hydrALAZINE  10-20 mg Intravenous Q6H  . levETIRAcetam  500 mg Intravenous Q12H  . levofloxacin (LEVAQUIN) IV  250 mg Intravenous Q24H  . pantoprazole sodium  40 mg Per Tube Daily  . phenytoin (DILANTIN) IV  500 mg Intravenous Once  . phenytoin (DILANTIN) IV  100 mg  Intravenous 3 times per day   sodium chloride, dextrose, heparin, sodium chloride  Assessment/ Plan:  focus of parenchymal hemorrhage at the junction of the left temporal and left parietal lobes which measures 1.6 x 1.6 cm.Mild surrounding edema but no mass effect or IV extension.  1. 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 Electrolytes Hyperkalemia resolved  3. Metabolic acidosis resolved  4. Anemia Will follow consider transfusion if less than 8  5. Bone mineral Will assess calcium 8 and phosphorus 3.5 Albumin 2.1  6. HTN Improved control   Plan CRRT for several days prior to transitioning to intermittent dialysis    Discussed prognosis with family. Seizures continue and another EEG being considered    LOS: 5 Matthew Beard @TODAY @11 :03 AM

## 2014-04-03 NOTE — Progress Notes (Signed)
NUTRITION FOLLOW UP/CONSULT  Intervention:   Continue TF per PEPuP Protocol as tolerated. Day 1 initiate Vital AF 1.2 at 25 mL/hr for the remainder of the day. Day 2 start new goal rate of 1680 mL (70 mL/hr), provides 2016 kcal, 126 g protein, 1362 ml free water.   If patient taken off of CVVHD to transition to HD, recommend transitioning to Nepro formula.  Day 1 initiate Nepro at 25 mL/hr for the remainder of the day. Day 2 start new goal rate of 1200 mL (50 mL/hr), provides 2160 kcal, 97 g protein, 872 ml free water.   RD to continue to follow nutrition care plan.  Nutrition Dx:   Inadequate oral intake related to inability to eat as evidenced by intubated, NPO, ongoing.  Goal:   Patient will meet >/=90% of estimated nutrition needs, not meeting  Monitor:   TF tolerance, weight trends, labs, I/Os, vent status  Assessment:   Pt admitted with convulsions at home. Pt found to have small focal hemorrhage at junction of left parietal and temporal lobes. Pt was intubated due to agitation. Pt has ESRD on HD at home; family reports he is not compliant.  Extubated 5/15. RD consulted to initiate enteral nutrition.  TF previously held due to high residuals (~500 ml). TF resumed this morning pt with orders for Vital High Protein at this time.  Palliative consult noted. GOC meeting on 5/15 - family wants to continue with HD and curative treatments. Currently on CVVHD.  CBG's: 87 - 104 Sodium low at 136 Potassium and phosphorus WNL   Height: Ht Readings from Last 1 Encounters:  03/30/14 5\' 10"  (1.778 m)    Weight Status:   Wt Readings from Last 1 Encounters:  04/03/14 178 lb 2.1 oz (80.8 kg)   EDW per renal: 79 kg  Re-estimated needs:  Kcal: 2000 - 2200 kcal Protein: >100 g Fluid: per MD  Skin: intact  Diet Order: NPO   Intake/Output Summary (Last 24 hours) at 04/03/14 0821 Last data filed at 04/03/14 0800  Gross per 24 hour  Intake 1760.4 ml  Output   1260 ml  Net   500.4 ml    Last BM: 5/15   Labs:   Recent Labs Lab 04/01/14 0400  04/02/14 0309 04/02/14 1600 04/03/14 0300  NA 141  140  < > 137 139 136*  K 4.3  4.3  < > 4.1 4.3 4.0  CL 105  104  < > 102 102 101  CO2 21  21  < > 23 25 26   BUN 46*  45*  < > 26* 18 14  CREATININE 5.61*  5.67*  < > 3.64* 2.86* 2.59*  CALCIUM 7.5*  7.5*  < > 7.8* 7.9* 8.0*  MG 2.3  --  2.3  --  2.3  PHOS 5.1*  5.1*  < > 3.5 3.0 2.8  GLUCOSE 77  76  < > 74 210* 104*  < > = values in this interval not displayed.  CBG (last 3)   Recent Labs  04/03/14 0025 04/03/14 0358 04/03/14 0729  GLUCAP 104* 87 95    Scheduled Meds: . amLODipine  10 mg Per Tube Daily  . antiseptic oral rinse  15 mL Mouth Rinse BID  . carvedilol  25 mg Per Tube BID WC  . chlorhexidine  15 mL Mouth Rinse BID  . chlorhexidine  15 mL Mouth Rinse BID  . darbepoetin (ARANESP) injection - DIALYSIS  100 mcg Intravenous Q Wed-HD  .  feeding supplement (PRO-STAT SUGAR FREE 64)  30 mL Per Tube BID  . feeding supplement (VITAL HIGH PROTEIN)  1,000 mL Per Tube Q24H  . hydrALAZINE  10-20 mg Intravenous Q6H  . levETIRAcetam  500 mg Intravenous Q12H  . levofloxacin (LEVAQUIN) IV  250 mg Intravenous Q24H  . pantoprazole sodium  40 mg Per Tube Daily    Continuous Infusions: . dextrose 50 mL/hr at 04/02/14 1522  . dextrose    . dialysis replacement fluid (prismasate) 500 mL/hr at 04/03/14 0240  . dialysis replacement fluid (prismasate) 300 mL/hr at 04/01/14 1500  . dialysate (PRISMASATE) 2,000 mL/hr at 04/03/14 0240    Jarold Motto MS, RD, LDN Inpatient Registered Dietitian Pager: [phone] After-hours pager: [phone]

## 2014-04-04 LAB — CBC
HEMATOCRIT: 23.4 % — AB (ref 39.0–52.0)
HEMOGLOBIN: 7.4 g/dL — AB (ref 13.0–17.0)
MCH: 29.1 pg (ref 26.0–34.0)
MCHC: 31.6 g/dL (ref 30.0–36.0)
MCV: 92.1 fL (ref 78.0–100.0)
Platelets: 151 10*3/uL (ref 150–400)
RBC: 2.54 MIL/uL — ABNORMAL LOW (ref 4.22–5.81)
RDW: 17.1 % — ABNORMAL HIGH (ref 11.5–15.5)
WBC: 4.8 10*3/uL (ref 4.0–10.5)

## 2014-04-04 LAB — MAGNESIUM: Magnesium: 2.3 mg/dL (ref 1.5–2.5)

## 2014-04-04 LAB — RENAL FUNCTION PANEL
ALBUMIN: 2.1 g/dL — AB (ref 3.5–5.2)
ALBUMIN: 2.1 g/dL — AB (ref 3.5–5.2)
BUN: 9 mg/dL (ref 6–23)
BUN: 8 mg/dL (ref 6–23)
CALCIUM: 7.5 mg/dL — AB (ref 8.4–10.5)
CHLORIDE: 103 meq/L (ref 96–112)
CO2: 25 mEq/L (ref 19–32)
CO2: 25 mEq/L (ref 19–32)
Calcium: 7.4 mg/dL — ABNORMAL LOW (ref 8.4–10.5)
Chloride: 101 mEq/L (ref 96–112)
Creatinine, Ser: 2.08 mg/dL — ABNORMAL HIGH (ref 0.50–1.35)
Creatinine, Ser: 1.94 mg/dL — ABNORMAL HIGH (ref 0.50–1.35)
GFR calc Af Amer: 45 mL/min — ABNORMAL LOW (ref >90)
GFR calc Af Amer: 48 mL/min — ABNORMAL LOW (ref >90)
GFR calc non Af Amer: 38 mL/min — ABNORMAL LOW (ref >90)
GFR calc non Af Amer: 42 mL/min — ABNORMAL LOW (ref >90)
Glucose, Bld: 101 mg/dL — ABNORMAL HIGH (ref 70–99)
Glucose, Bld: 91 mg/dL (ref 70–99)
POTASSIUM: 4.2 meq/L (ref 3.7–5.3)
Phosphorus: 2.3 mg/dL (ref 2.3–4.6)
Phosphorus: 2.3 mg/dL (ref 2.3–4.6)
Potassium: 4.4 mEq/L (ref 3.7–5.3)
Sodium: 137 mEq/L (ref 137–147)
Sodium: 135 mEq/L — ABNORMAL LOW (ref 137–147)

## 2014-04-04 LAB — GLUCOSE, CAPILLARY
GLUCOSE-CAPILLARY: 90 mg/dL (ref 70–99)
GLUCOSE-CAPILLARY: 109 mg/dL — AB (ref 70–99)
GLUCOSE-CAPILLARY: 101 mg/dL — AB (ref 70–99)
GLUCOSE-CAPILLARY: 46 mg/dL — AB (ref 70–99)
Glucose-Capillary: 113 mg/dL — ABNORMAL HIGH (ref 70–99)
Glucose-Capillary: 90 mg/dL (ref 70–99)
Glucose-Capillary: 94 mg/dL (ref 70–99)

## 2014-04-04 MED ORDER — PANTOPRAZOLE SODIUM 40 MG PO TBEC
40.0000 mg | DELAYED_RELEASE_TABLET | Freq: Every day | ORAL | Status: DC
  Administered 2014-04-04 – 2014-04-05 (×2): 40 mg via ORAL
  Filled 2014-04-04 (×2): qty 1

## 2014-04-04 NOTE — Progress Notes (Signed)
PULMONARY / CRITICAL CARE MEDICINE   Name: Matthew Beard MRN: [id number] DOB: 1976/03/13   PCP [name],[name], MD   ADMISSION DATE:  03/29/2014 CONSULTATION DATE:  03/30/14  REFERRING MD :  EDP PRIMARY SERVICE: PCCM  CHIEF COMPLAINT:  Cerebral hemorrhage  BRIEF PATIENT DESCRIPTION:   . [name] is a 38 y.o. M with PMH significant for ESRD (non-compliant with dialysis), HTN, anemia, NICM, CHF, SOB, who presented to ED on 5/11 after he was at home and per ED notes, fell backwards and began to shake.  His fiances children witnessed the event and called her.  He was in his USOH prior to this episode although fiance notes that his health has not been very good of late.   In ED, initial BP was 209/142 and got as high as 249/144.  Head CT was obtained and revealed a small focal hemorrhage at junction of left parietal and temporal lobes. Pt was intubated due to agitation/attempting to pull at lines/confusion/concern for lack of ability to protect airway. Per pt's fiance, he is non-compliant with his home meds as well as dialysis and she believes that he was supposed to have dialysis today, however, missed it. Of note, [facility] after he was admitted and treated for volume overload and found to have ESRD.   SIGNIFICANT EVENTS / STUDIES:  5/11 - fell backwards at home and began shaking, questionable loss of consciousness. 5/12 Head CT >>> small hemorrhage at junction of left temporal and parietal lobes.  Intubated by EDP for airway protection.  LINES / TUBES: OETT 5/12 >>>5/15 OGT 5/12 >>> Foley 5/12 >>> Rt IJ Trialysis cath 03/30/14 >>  CULTURES: Sputum 5/12 >>>   ANTIBIOTICS: Vanc 5/11 >>5/11 Maxipime 5/11 >>>5/12 Imipenem  5/12 >>5/13 Levaquin 5/13 (aspn) >>     EVENTS   03/30/14: Dr Pearlean BrownieSethi recommends SBP 120-160 as goal. Patient very hypertensive and agitated. CT angio head shows no IC aneurysm but has acute left temporal lobe hge. : Remains  hypertensive on propofol and cardene.  Intubated by EDP.    03/31/14: MRI with 1.8cm acute left temporal lobe bleed with multiple micro infarcts and bleed. On diprivan: RASS -3 but doing sbt.   Off cardene. On serveral home bp meds - sbp 111 . ECHO shows sudden drop in EF from 50% in march 2015 tor 25% now. Global hypokinesis   04/01/14: Cards reckons hypertensive "blown out" cardiomyopathy. On CVVH ongoing. Off sedation: not awake enough for extubation though doing SBT  04/02/14: Palliative care meeting: family understands poor long term prognosis. 38 year old son struggling will patient illness but understands gravity of situation. Full code for now including reintubation. Patient agitated but not following commands but good cough and gag. Ok from neuro stand point to extubate. Having hypoglycemia  04/03/14: Extubated 24h ago. Neur concerned for sub clinical seizures, repeat EEG in progress. Agitated intermittently with perioids of following commands. On d10 with sugars now 95    SUBJECTIVE/OVERNIGHT/INTERVAL HX 04/04/14: Now on precedex: much improved. Calm and talking. EEG in progress. Still needing D10  VITAL SIGNS: Temp:  [97.3 F (36.3 C)-98 F (36.7 C)] 97.5 F (36.4 C) (05/17 0700) Pulse Rate:  [55-80] 79 (05/17 1000) Resp:  [8-35] 20 (05/17 1000) BP: (118-164)/(79-113) 124/95 mmHg (05/17 1000) SpO2:  [93 %-100 %] 100 % (05/17 1000) Weight:  [81.4 kg (179 lb 7.3 oz)] 81.4 kg (179 lb 7.3 oz) (05/17 0400) HEMODYNAMICS:   VENTILATOR SETTINGS:  INTAKE / OUTPUT: Intake/Output     05/16 0701 - 05/17 0700 05/17 0701 - 05/18 0700   P.O. 205    I.V. (mL/kg) 1697.9 (20.9) 96.2 (1.2)   NG/GT 350    IV Piggyback 215    Total Intake(mL/kg) 2467.9 (30.3) 96.2 (1.2)   Urine (mL/kg/hr) 790 (0.4) 90 (0.3)   Other 1916 (1) 94 (0.3)   Total Output 2706 184   Net -238.1 -87.8          PHYSICAL EXAMINATION: General: Young AA male, , Looks ok Neuro: Currently RASS 0. Watching TV.  Talking. Mild confusion + HEENT: [place]/AT. PERRL, sclerae anicteric. Cardiovascular: tachy but regular, no M/R/G.  Lungs: CTA bilaterally, No W/R/R.  Abdomen: BS x 4, soft, NT/ND.  Musculoskeletal: No gross deformities, 2+ edema to LE's bilaterally.  Fistula noted LUE. Skin: Intact, warm, no rashes.    LABS: PULMONARY  Recent Labs Lab 03/29/14 2307 03/30/14 0122 03/30/14 0422 03/30/14 0559  PHART 7.463* 7.424 7.431 7.429  PCO2ART 28.4* 33.9* 31.4* 31.8*  PO2ART 69.0* 102.0* 46.6* 329.0*  HCO3 20.0 22.0 20.5 20.7  TCO2 21 23 21.5 21.7  O2SAT 93.0 98.0 77.2 100.0    CBC  Recent Labs Lab 04/02/14 0309 04/03/14 0300 04/04/14 0430  HGB 8.0* 8.1* 7.4*  HCT 25.1* 25.4* 23.4*  WBC 8.1 6.0 4.8  PLT 176 172 151    COAGULATION  Recent Labs Lab 03/30/14 0052  INR 1.30    CARDIAC    Recent Labs Lab 03/30/14 0637 03/30/14 1035 03/30/14 1555 03/30/14 2330 03/31/14 0430  TROPONINI 0.45* 0.56* 0.51* 0.80* 0.71*   No results found for this basename: PROBNP,  in the last 168 hours   CHEMISTRY  Recent Labs Lab 03/31/14 0430  04/01/14 0400  04/02/14 0309 04/02/14 1600 04/03/14 0300 04/03/14 1550 04/04/14 0428 04/04/14 0430  NA 143  < > 141  140  < > 137 139 136* 134* 137  --   K 5.6*  < > 4.3  4.3  < > 4.1 4.3 4.0 4.0 4.2  --   CL 103  < > 105  104  < > 102 102 101 99 103  --   CO2 19  < > 21  21  < > 23 25 26 25 25   --   GLUCOSE 70  < > 77  76  < > 74 210* 104* 112* 101*  --   BUN 66*  < > 46*  45*  < > 26* 18 14 12 9   --   CREATININE 8.49*  < > 5.61*  5.67*  < > 3.64* 2.86* 2.59* 2.49* 2.08*  --   CALCIUM 8.1*  < > 7.5*  7.5*  < > 7.8* 7.9* 8.0* 7.4* 7.4*  --   MG 2.3  --  2.3  --  2.3  --  2.3  --   --  2.3  PHOS 6.5*  < > 5.1*  5.1*  < > 3.5 3.0 2.8 2.4 2.3  --   < > = values in this interval not displayed. Estimated Creatinine Clearance: 49.2 ml/min (by C-G formula based on Cr of 2.08).   LIVER  Recent Labs Lab 03/29/14 2255  03/30/14 0052  04/02/14 0309 04/02/14 1600 04/03/14 0300 04/03/14 1550 04/04/14 0428  AST 20  --   --   --   --   --   --   --   ALT 19  --   --   --   --   --   --   --  ALKPHOS 80  --   --   --   --   --   --   --   BILITOT 0.5  --   --   --   --   --   --   --   PROT 7.8  --   --   --   --   --   --   --   ALBUMIN 2.9*  --   < > 2.1* 2.1* 2.2* 2.1* 2.1*  INR  --  1.30  --   --   --   --   --   --   < > = values in this interval not displayed.   INFECTIOUS  Recent Labs Lab 03/29/14 2314 03/31/14 0450 04/02/14 0310  LATICACIDVEN 2.71* 0.8 0.8     ENDOCRINE CBG (last 3)   Recent Labs  04/04/14 0017 04/04/14 0425 04/04/14 0727  GLUCAP 113* 90 109*         IMAGING x48h  Dg Abd Portable 1v  04/02/2014   CLINICAL DATA:  Feeding tube placement  EXAM: PORTABLE ABDOMEN - 1 VIEW  COMPARISON:  None.  FINDINGS: Feeding tube tip is in the stomach. Bowel gas pattern is unremarkable. No free air is seen on this supine examination.  IMPRESSION: Feeding tube tip in stomach.   Electronically Signed   By: Bretta Bang M.D.   On: 04/02/2014 17:25      ASSESSMENT / PLAN:  NEUROLOGIC A:  ICH - likely secondary to significantly elevated BP in the setting of hypertensive emergency. Questionable seizure. Urine tox negative.   - . Neuro concerned for sub clinical seizures. Mental status improved after precedex  P:   - Neurosurgery and neurology consult appreciated; EEG 04/03/2014 - Keppra. 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 BP now below goal of 120-160 sbp.   P:  - SBP goal 120-160 per neuro - cards mgmt appreciated - coreg and hydralazine  And norvasc to continue   PULMONARY A: VDRF - in the setting of ICH. S/p extubation 04/02/14 Concern for aspiration.   - no acute  issues. On RA  P:   - monitor  .  RENAL A:  ESRD   - renal doing cvvh in setting of IC Hge and VDRF   P:   - per Nephrology consult.   GASTROINTESTINAL A:   NPO, D 3 diet passed  P:   - SUP: Pantoprazole. - continue d3 diet  HEMATOLOGIC A:   No acute issues. P:  - VTE prophylaxis:  SCD's only. - CBC in AM.  INFECTIOUS A:   Concern for aspiration. P:   - RXt levaquin (total 7d abx); 04/06/14 is last day - Monitor fever curve / WBC's.  ENDOCRINE A:   Having hypoglycemia 04/02/14 P:   -  D10 gtt @ 50cc/h - CBG's q4. - SSI.  GLOBAL   04/02/14: See detailed palliative care note; full code for now. Family realistic but adjusting.   04/04/14: No family at bedside. Can move out when off precedex   The patient is critically ill with multiple organ systems failure and requires high complexity decision making for assessment and support, frequent evaluation and titration of therapies, application of advanced monitoring technologies and extensive interpretation of multiple databases.  Critical Care Time devoted to patient care services described in this note is  35 additional  Minutes.  Dr. Kalman Shan, M.D., [facility].C.P Pulmonary and Critical Care Medicine Staff Physician [facility] Pager: [phone], If no answer or between  15:00h - 7:00h: call [phone]  04/04/2014 10:21 AM

## 2014-04-04 NOTE — Progress Notes (Signed)
[facility] ROUNDING NOTE   Subjective:   Interval History: Patient extubated and is tolerating nectar feeding with good protection of airway. No speech and attempting to follow commands.   Objective:  Vital signs in last 24 hours:  Temp:  [97.3 F (36.3 C)-98 F (36.7 C)] 97.5 F (36.4 C) (05/17 0700) Pulse Rate:  [55-80] 79 (05/17 1000) Resp:  [8-35] 20 (05/17 1000) BP: (118-164)/(79-113) 124/95 mmHg (05/17 1000) SpO2:  [93 %-100 %] 100 % (05/17 1000) Weight:  [81.4 kg (179 lb 7.3 oz)] 81.4 kg (179 lb 7.3 oz) (05/17 0400)  Weight change: 0.6 kg (1 lb 5.2 oz) Filed Weights   04/02/14 0344 04/03/14 0500 04/04/14 0400  Weight: 80.5 kg (177 lb 7.5 oz) 80.8 kg (178 lb 2.1 oz) 81.4 kg (179 lb 7.3 oz)    Intake/Output: I/O last 3 completed shifts: In: 3542.9 [P.O.:205; I.V.:2297.9; NG/GT:720; IV Piggyback:320] Out: 3386 [Urine:970; Other:2416]   Intake/Output this shift:  Total I/O In: 364.3 [P.O.:200; I.V.:164.3] Out: 264 [Urine:120; Other:144]   Alert and does not follow commands CVS- RRR RS- CTA ABD- BS present soft non-distended EXT- no edema   Basic Metabolic Panel:  Recent Labs Lab 03/31/14 0430  04/01/14 0400  04/02/14 0309 04/02/14 1600 04/03/14 0300 04/03/14 1550 04/04/14 0428 04/04/14 0430  NA 143  < > 141  140  < > 137 139 136* 134* 137  --   K 5.6*  < > 4.3  4.3  < > 4.1 4.3 4.0 4.0 4.2  --   CL 103  < > 105  104  < > 102 102 101 99 103  --   CO2 19  < > 21  21  < > 23 25 26 25 25   --   GLUCOSE 70  < > 77  76  < > 74 210* 104* 112* 101*  --   BUN 66*  < > 46*  45*  < > 26* 18 14 12 9   --   CREATININE 8.49*  < > 5.61*  5.67*  < > 3.64* 2.86* 2.59* 2.49* 2.08*  --   CALCIUM 8.1*  < > 7.5*  7.5*  < > 7.8* 7.9* 8.0* 7.4* 7.4*  --   MG 2.3  --  2.3  --  2.3  --  2.3  --   --  2.3  PHOS 6.5*  < > 5.1*  5.1*  < > 3.5 3.0 2.8 2.4 2.3  --   < > = values in this interval not displayed.  Liver Function Tests:  Recent Labs Lab  03/29/14 2255  04/02/14 0309 04/02/14 1600 04/03/14 0300 04/03/14 1550 04/04/14 0428  AST 20  --   --   --   --   --   --   ALT 19  --   --   --   --   --   --   ALKPHOS 80  --   --   --   --   --   --   BILITOT 0.5  --   --   --   --   --   --   PROT 7.8  --   --   --   --   --   --   ALBUMIN 2.9*  < > 2.1* 2.1* 2.2* 2.1* 2.1*  < > = values in this interval not displayed. No results found for this basename: LIPASE, AMYLASE,  in the last 168 hours  Recent Labs  Lab 03/29/14 2255  AMMONIA 16    CBC:  Recent Labs Lab 03/29/14 2255  03/31/14 0430 04/01/14 0400 04/02/14 0309 04/03/14 0300 04/04/14 0430  WBC 9.7  < > 6.6 5.3 8.1 6.0 4.8  NEUTROABS 8.4*  --  4.9 3.8 5.8 4.1  --   HGB 8.4*  < > 8.1* 8.9* 8.0* 8.1* 7.4*  HCT 25.9*  < > 25.2* 26.8* 25.1* 25.4* 23.4*  MCV 91.5  < > 89.7 89.0 90.6 92.7 92.1  PLT 198  < > 221 194 176 172 151  < > = values in this interval not displayed.  Cardiac Enzymes:  Recent Labs Lab 03/30/14 9485 03/30/14 1035 03/30/14 1555 03/30/14 2330 03/31/14 0430 04/02/14 0310  CKTOTAL  --   --   --   --  75 176  CKMB  --   --   --   --  2.1 3.5  TROPONINI 0.45* 0.56* 0.51* 0.80* 0.71*  --     BNP: No components found with this basename: POCBNP,   CBG:  Recent Labs Lab 04/03/14 1519 04/03/14 1919 04/04/14 0017 04/04/14 0425 04/04/14 0727  GLUCAP 111* 126* 113* 90 109*    Microbiology: Results for orders placed during the hospital encounter of 03/29/14  URINE CULTURE     Status: None   Collection Time    03/29/14 10:51 PM      Result Value Ref Range Status   Specimen Description URINE, CATHETERIZED   Final   Special Requests NONE   Final   Culture  Setup Time     Final   Value: 03/29/2014 23:32     Performed at [facility] Count     Final   Value: NO GROWTH     Performed at [facility]   Culture     Final   Value: NO GROWTH     Performed at [facility]   Report Status 03/31/2014  FINAL   Final  CULTURE, BLOOD (ROUTINE X 2)     Status: None   Collection Time    03/29/14 10:55 PM      Result Value Ref Range Status   Specimen Description BLOOD ARM RIGHT   Final   Special Requests BOTTLES DRAWN AEROBIC AND ANAEROBIC 5CC EA   Final   Culture  Setup Time     Final   Value: 03/30/2014 04:10     Performed at [facility]   Culture     Final   Value:        BLOOD CULTURE RECEIVED NO GROWTH TO DATE CULTURE WILL BE HELD FOR 5 DAYS BEFORE ISSUING A FINAL NEGATIVE REPORT     Performed at [facility]   Report Status PENDING   Incomplete  CULTURE, BLOOD (ROUTINE X 2)     Status: None   Collection Time    03/29/14 11:03 PM      Result Value Ref Range Status   Specimen Description BLOOD HAND LEFT   Final   Special Requests BOTTLES DRAWN AEROBIC ONLY 6CC   Final   Culture  Setup Time     Final   Value: 03/30/2014 04:10     Performed at [facility]   Culture     Final   Value:        BLOOD CULTURE RECEIVED NO GROWTH TO DATE CULTURE WILL BE HELD FOR 5 DAYS BEFORE ISSUING A FINAL NEGATIVE REPORT     Performed at  [facility]   Report Status PENDING   Incomplete  MRSA PCR SCREENING     Status: None   Collection Time    03/30/14  4:20 AM      Result Value Ref Range Status   MRSA by PCR NEGATIVE  NEGATIVE Final   Comment:            The GeneXpert MRSA Assay (FDA     approved for NASAL specimens     only), is one component of a     comprehensive MRSA colonization     surveillance program. It is not     intended to diagnose MRSA     infection nor to guide or     monitor treatment for     MRSA infections.  CULTURE, RESPIRATORY (NON-EXPECTORATED)     Status: None   Collection Time    03/30/14  4:45 AM      Result Value Ref Range Status   Specimen Description TRACHEAL ASPIRATE   Final   Special Requests NONE   Final   Gram Stain     Final   Value: MODERATE WBC PRESENT,BOTH PMN AND MONONUCLEAR     NO SQUAMOUS EPITHELIAL CELLS SEEN      ABUNDANT GRAM POSITIVE COCCI     IN PAIRS     Performed at [facility]   Culture     Final   Value: Non-Pathogenic Oropharyngeal-type Flora Isolated.     Performed at [facility]   Report Status 04/01/2014 FINAL   Final    Coagulation Studies: No results found for this basename: LABPROT, INR,  in the last 72 hours  Urinalysis: No results found for this basename: COLORURINE, APPERANCEUR, LABSPEC, PHURINE, GLUCOSEU, HGBUR, BILIRUBINUR, KETONESUR, PROTEINUR, UROBILINOGEN, NITRITE, LEUKOCYTESUR,  in the last 72 hours    Imaging: Dg Abd Portable 1v  04/02/2014   CLINICAL DATA:  Feeding tube placement  EXAM: PORTABLE ABDOMEN - 1 VIEW  COMPARISON:  None.  FINDINGS: Feeding tube tip is in the stomach. Bowel gas pattern is unremarkable. No free air is seen on this supine examination.  IMPRESSION: Feeding tube tip in stomach.   Electronically Signed   By: Bretta Bang M.D.   On: 04/02/2014 17:25     Medications:   . dexmedetomidine 0.4 mcg/kg/hr (04/04/14 1000)  . dextrose 50 mL/hr at 04/04/14 0901  . dextrose    . dialysis replacement fluid (prismasate) 500 mL/hr at 04/04/14 0752  . dialysis replacement fluid (prismasate) 300 mL/hr at 04/04/14 0900  . dialysate (PRISMASATE) 2,000 mL/hr at 04/04/14 0946   . amLODipine  10 mg Per Tube Daily  . antiseptic oral rinse  15 mL Mouth Rinse BID  . carvedilol  25 mg Per Tube BID WC  . chlorhexidine  15 mL Mouth Rinse BID  . chlorhexidine  15 mL Mouth Rinse BID  . darbepoetin (ARANESP) injection - DIALYSIS  100 mcg Intravenous Q Wed-HD  . hydrALAZINE  100 mg Oral 4 times per day  . levETIRAcetam  500 mg Intravenous Q12H  . levofloxacin (LEVAQUIN) IV  250 mg Intravenous Q24H  . pantoprazole  40 mg Oral Daily  . phenytoin (DILANTIN) IV  100 mg Intravenous 3 times per day   sodium chloride, dextrose, heparin, sodium chloride  Assessment/ Plan:  focus of parenchymal hemorrhage at the junction of the left temporal and left  parietal lobes which measures 1.6 x 1.6 cm.Mild surrounding edema but no mass effect or IV extension.  1.  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 Electrolytes Hyperkalemia resolved  3. Metabolic acidosis resolved  4. Anemia transfuse per Dr Marchelle Gearingamaswamy  5. Bone mineral Will assess calcium 8 and phosphorus 3.5 Albumin 2.1  6. HTN Improved control   Controlled with CVVHDF no changes today. Hopefully can transition to intermittent HD in AM Baseline cognitive impaired- now will most likely be worse. Consider intensive neuro rehab     LOS: 6 Garnetta BuddyMartin W Xavia Kniskern @TODAY @11 :01 AM

## 2014-04-04 NOTE — Progress Notes (Signed)
LTM EEG D/C'd per Dr Kirkpatrick 

## 2014-04-04 NOTE — Procedures (Signed)
History: 38 year old male with left temporal hemorrhage and seizure on admission   Technique: This is a continuous video-EEG  With a 17 channel routine scalp EEG performed at the bedside with bipolar and monopolar montages arranged in accordance to the international 10/20 system of electrode placement. One channel was dedicated to EKG recording. IT recorded from 10:38 am on 05/17 until    Background: The background consists dominantly intermixed delta and theta frequencies. This is a low-voltage EEG. At times, there is a poorly formed posterior dominant rhythm seen with frequency of 7 Hz.  Sleep spindles are seen better on the right than left at times.  At times frontally predominant rhythmic delta activity is seen(FIRDA).   The background is frequently obscured by muscle artifact and due to low-voltage nature of the cerebral rhythms, I am concerned that subtle seizures could have been missed. The patient does have head turning to the left, however he is in the room with the door to the left and therefore was likely responded to stimuli happening around his room. There was never any clear electrographic seizure.  Photic stimulation: Physiologic driving is not performed  EEG Abnormalities: 1) Generalized irregular slow activity.  2) slow PDR  Clinical Interpretation: This EEG is consistent with a nonspecific generalized or dysfunction. There was no seizure or seizure predisposition recorded on this study.   Ritta Slot, MD [facility] [phone]  If 7pm- 7am, please page neurology on call as listed in AMION.

## 2014-04-04 NOTE — Progress Notes (Signed)
Dr Bryna Colander made aware of renal panel results.

## 2014-04-04 NOTE — Progress Notes (Signed)
Stroke Team Progress Note  HISTORY Matthew Beard is a 38 y.o. 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  Patient was home with his children when suddenly collapsed to the ground and had a generalized convulsion. EMS called and patient brought to the ED where He was confused with SBP 249.  Urgent CT brain revealed a focus of parenchymal hemorrhage at the junction of the left temporal and left parietal lobes which measures 1.6 x 1.6 cm.Mild surrounding edema but no mass effect or IV extension.  PTT 37. INR 1.30. Platelets 198  Patient was not protecting his airway and was intubated in the ED.  Loaded with 1 gram IV keppra. Nicardipine infusion started.   Date last known well: 03/29/14  Time last known well: unclear  tPA Given: no, ICH   SUBJECTIVE No family at bedside. The patient is extubated, but he is minimally verbal. Not following commands. On HD this AM  OBJECTIVE Most recent Vital Signs: Filed Vitals:   04/04/14 0547 04/04/14 0600 04/04/14 0700 04/04/14 0800  BP: 140/103 141/94 128/100 118/84  Pulse:  57 59 55  Temp:   97.5 F (36.4 C)   TempSrc:   Oral   Resp:  10 11 9   Height:      Weight:      SpO2:  100% 100% 99%   CBG (last 3)   Recent Labs  04/04/14 0017 04/04/14 0425 04/04/14 0727  GLUCAP 113* 90 109*    IV Fluid Intake:   . dexmedetomidine 0.7 mcg/kg/hr (04/04/14 0800)  . dextrose 50 mL/hr at 04/02/14 1522  . dextrose    . dialysis replacement fluid (prismasate) 500 mL/hr at 04/04/14 0752  . dialysis replacement fluid (prismasate) 300 mL/hr at 04/03/14 0930  . dialysate (PRISMASATE) 2,000 mL/hr at 04/04/14 0715    MEDICATIONS  . amLODipine  10 mg Per Tube Daily  . antiseptic oral rinse  15 mL Mouth Rinse BID  . carvedilol  25 mg Per Tube BID WC   . chlorhexidine  15 mL Mouth Rinse BID  . chlorhexidine  15 mL Mouth Rinse BID  . darbepoetin (ARANESP) injection - DIALYSIS  100 mcg Intravenous Q Wed-HD  . hydrALAZINE  100 mg Oral 4 times per day  . levETIRAcetam  500 mg Intravenous Q12H  . levofloxacin (LEVAQUIN) IV  250 mg Intravenous Q24H  . pantoprazole sodium  40 mg Per Tube Daily  . phenytoin (DILANTIN) IV  100 mg Intravenous 3 times per day   PRN:  sodium chloride, dextrose, heparin, sodium chloride  Diet:  Dysphagia III Nectar thick liquids Activity:  Bedrest DVT Prophylaxis:  SCDs  CLINICALLY SIGNIFICANT STUDIES Basic Metabolic Panel:   Recent Labs Lab 04/03/14 0300 04/03/14 1550 04/04/14 0428 04/04/14 0430  NA 136* 134* 137  --   K 4.0 4.0 4.2  --   CL 101 99 103  --   CO2 26 25 25   --   GLUCOSE 104* 112* 101*  --   BUN 14 12 9   --   CREATININE 2.59* 2.49* 2.08*  --   CALCIUM 8.0* 7.4* 7.4*  --   MG 2.3  --   --  2.3  PHOS 2.8 2.4 2.3  --    Liver Function Tests:   Recent Labs Lab 03/29/14 2255  04/03/14 1550 04/04/14  0428  AST 20  --   --   --   ALT 19  --   --   --   ALKPHOS 80  --   --   --   BILITOT 0.5  --   --   --   PROT 7.8  --   --   --   ALBUMIN 2.9*  < > 2.1* 2.1*  < > = values in this interval not displayed. CBC:   Recent Labs Lab 04/02/14 0309 04/03/14 0300 04/04/14 0430  WBC 8.1 6.0 4.8  NEUTROABS 5.8 4.1  --   HGB 8.0* 8.1* 7.4*  HCT 25.1* 25.4* 23.4*  MCV 90.6 92.7 92.1  PLT 176 172 151   Coagulation:   Recent Labs Lab 03/30/14 0052  LABPROT 15.9*  INR 1.30   Cardiac Enzymes:   Recent Labs Lab 03/30/14 1555 03/30/14 2330 03/31/14 0430 04/02/14 0310  CKTOTAL  --   --  75 176  CKMB  --   --  2.1 3.5  TROPONINI 0.51* 0.80* 0.71*  --    Urinalysis:   Recent Labs Lab 03/29/14 2251  COLORURINE YELLOW  LABSPEC 1.019  PHURINE 6.5  GLUCOSEU NEGATIVE  HGBUR LARGE*  BILIRUBINUR NEGATIVE  KETONESUR NEGATIVE  PROTEINUR >300*  UROBILINOGEN 1.0  NITRITE  NEGATIVE  LEUKOCYTESUR NEGATIVE   Lipid Panel    Component Value Date/Time   CHOL 187 01/22/2012 0515   TRIG 236* 04/02/2014 0309   HDL 38* 01/22/2012 0515   CHOLHDL 4.9 01/22/2012 0515   VLDL 25 01/22/2012 0515   LDLCALC 124* 01/22/2012 0515   HgbA1C  Lab Results  Component Value Date   HGBA1C 5.3 03/08/2014    Urine Drug Screen:     Component Value Date/Time   LABOPIA NONE DETECTED 03/29/2014 2251   LABOPIA NEGATIVE 03/08/2014 2108   COCAINSCRNUR NONE DETECTED 03/29/2014 2251   COCAINSCRNUR NEGATIVE 03/08/2014 2108   LABBENZ NONE DETECTED 03/29/2014 2251   LABBENZ NEGATIVE 03/08/2014 2108   AMPHETMU NONE DETECTED 03/29/2014 2251   AMPHETMU NEGATIVE 03/08/2014 2108   THCU NONE DETECTED 03/29/2014 2251   LABBARB NONE DETECTED 03/29/2014 2251    Alcohol Level: No results found for this basename: ETH,  in the last 168 hours  Ct Head Wo Contrast 03/29/2014    Significantly limited exam by patient motion artifact.  A focus of hemorrhage is noted as described above at the junction of the left temporal and parietal lobes. Mild surrounding edema is noted.      Dg Chest Port 1 View 03/30/2014 Endotracheal tube as described.  Increasing bilateral infiltrative changes.   Electronically Signed   By: Alcide CleverMark  Lukens M.D.   On: 03/30/2014 00:21   Dg Chest Port 1 View 03/29/2014    Cardiac enlargement.  No evidence of active pulmonary disease.    CT Angio of head 03/30/2014 1. No intracranial aneurysm identified. 2. Mild anterior and posterior circulation branches vessel irregularity, suggestive of mild intracranial atherosclerosis. No evidence of significant proximal stenosis. 3. Unchanged, acute left temporal lobe parenchymal hemorrhage.  EEG Abnormalities:  1) Triphasic waves  2) generalized slow activity  Clinical Interpretation: This EEG is consistent with a generalized nonspecific cerebral dysfunction. Triphasic waves, nonspecific, can be associated with metabolic encephalopathy such as renal  failure.    2D Echocardiogram  Left ventricle: The cavity size was normal. Wall thickness was increased in a pattern of severe LVH.  Systolic function was severely reduced. The estimated ejection fraction was in the range  of 25% to 30%.  Severe hypokinesis of the entire myocardium.   Carotid Doppler  pending  CXR   IMPRESSION:  1. Stable positioning of support apparatus. No pneumothorax.  2. Marked improved aeration of the lungs suggests resolving edema  and atelectasis.  EKG  Normal sinus rhythm rate 96 beats per minute.  For complete results please see formal report.   Therapy Recommendations pending  Physical Exam  General: The patient is sedated, unable to cooperate significantly at the time of the examination.  Skin: No significant peripheral edema is noted.   Neurologic Exam  Mental status: the patient is extubated, minimally verbal, sedated, not able to follow commands.  Cranial nerves: Facial symmetry is present. Speech is whispery, difficult to understand. He will blink to threat bilaterally, will not track objects well.  Motor: The patient has symmetric motor tone on all 4's, the patient is not following commands for full motor testing, no obvious hemiparesis. He has intermittent tremors of the arms bilaterally.  Sensory examination: Pain stimulation appears to result in a symmetric response on the arms and legs.  Coordination: The patient is unable to follow commands for cerebellar testing.  Gait and station: The gait could not be tested.  Reflexes: Deep tendon reflexes are symmetric, but are hyper.   ASSESSMENT [name] is a 38 y.o. male presenting with a witnessed seizure and collapse. TPA was not initiated secondary to hemorrhage. Head CT - a focus of hemorrhage is noted at the junction of the left temporal and parietal lobes.On no atherothrombotics prior to admission. Now on no atherothrombotics for secondary stroke prevention due to hemorrhagic  stroke. Patient with resultant unresponsiveness. Stroke work up underway.   Hypertension  History of noncompliance with medications  ESRD - HD  Nonischemic cardiomyopathy. Ejection fraction 25-30%.  History of lacunar infarcts  Anemia   Hospital day # 6  The patient is not responding well, some tremors seen, may have a concern for subclinical seizures. At baseline, the patient is cognitively slow.  TREATMENT/PLAN  No antithrombotics at this time for secondary stroke prevention due to hemorrhagic stroke.  Await carotid Dopplers  Keep systolic blood pressure less than 160  On dilantin and keppra will check dilantin levels in am  Prolonged EEG study No epileptiform discharges are seen, full report to follow.     04/04/2014, 8:21 AM   York Spaniel [phone]      To contact Stroke Continuity provider, please refer to [email or web address]. After hours, contact General Neurology

## 2014-04-05 DIAGNOSIS — I5031 Acute diastolic (congestive) heart failure: Secondary | ICD-10-CM

## 2014-04-05 LAB — GLUCOSE, CAPILLARY
GLUCOSE-CAPILLARY: 113 mg/dL — AB (ref 70–99)
GLUCOSE-CAPILLARY: 85 mg/dL (ref 70–99)
GLUCOSE-CAPILLARY: 94 mg/dL (ref 70–99)
GLUCOSE-CAPILLARY: 96 mg/dL (ref 70–99)
Glucose-Capillary: 105 mg/dL — ABNORMAL HIGH (ref 70–99)
Glucose-Capillary: 124 mg/dL — ABNORMAL HIGH (ref 70–99)

## 2014-04-05 LAB — RENAL FUNCTION PANEL
Albumin: 2.1 g/dL — ABNORMAL LOW (ref 3.5–5.2)
BUN: 7 mg/dL (ref 6–23)
CHLORIDE: 101 meq/L (ref 96–112)
CO2: 26 meq/L (ref 19–32)
CREATININE: 1.93 mg/dL — AB (ref 0.50–1.35)
Calcium: 7.6 mg/dL — ABNORMAL LOW (ref 8.4–10.5)
GFR calc Af Amer: 49 mL/min — ABNORMAL LOW (ref >90)
GFR calc non Af Amer: 42 mL/min — ABNORMAL LOW (ref >90)
Glucose, Bld: 100 mg/dL — ABNORMAL HIGH (ref 70–99)
POTASSIUM: 4.3 meq/L (ref 3.7–5.3)
Phosphorus: 2.3 mg/dL (ref 2.3–4.6)
Sodium: 135 mEq/L — ABNORMAL LOW (ref 137–147)

## 2014-04-05 LAB — CBC WITH DIFFERENTIAL/PLATELET
BASOS PCT: 0 % (ref 0–1)
Basophils Absolute: 0 10*3/uL (ref 0.0–0.1)
Eosinophils Absolute: 0.1 10*3/uL (ref 0.0–0.7)
Eosinophils Relative: 3 % (ref 0–5)
HEMATOCRIT: 23.6 % — AB (ref 39.0–52.0)
Hemoglobin: 7.4 g/dL — ABNORMAL LOW (ref 13.0–17.0)
LYMPHS PCT: 21 % (ref 12–46)
Lymphs Abs: 1.1 10*3/uL (ref 0.7–4.0)
MCH: 29 pg (ref 26.0–34.0)
MCHC: 31.4 g/dL (ref 30.0–36.0)
MCV: 92.5 fL (ref 78.0–100.0)
MONO ABS: 0.6 10*3/uL (ref 0.1–1.0)
Monocytes Relative: 11 % (ref 3–12)
NEUTROS ABS: 3.3 10*3/uL (ref 1.7–7.7)
Neutrophils Relative %: 65 % (ref 43–77)
Platelets: 157 10*3/uL (ref 150–400)
RBC: 2.55 MIL/uL — ABNORMAL LOW (ref 4.22–5.81)
RDW: 17.7 % — ABNORMAL HIGH (ref 11.5–15.5)
WBC: 5.1 10*3/uL (ref 4.0–10.5)

## 2014-04-05 LAB — CULTURE, BLOOD (ROUTINE X 2)
CULTURE: NO GROWTH
Culture: NO GROWTH

## 2014-04-05 LAB — PHENYTOIN LEVEL, TOTAL: Phenytoin Lvl: 4.1 ug/mL — ABNORMAL LOW (ref 10.0–20.0)

## 2014-04-05 LAB — MAGNESIUM: MAGNESIUM: 2.4 mg/dL (ref 1.5–2.5)

## 2014-04-05 MED ORDER — ENSURE PUDDING PO PUDG
1.0000 | Freq: Two times a day (BID) | ORAL | Status: DC
  Administered 2014-04-05 – 2014-04-21 (×20): 1 via ORAL

## 2014-04-05 MED ORDER — CARVEDILOL 25 MG PO TABS
25.0000 mg | ORAL_TABLET | Freq: Two times a day (BID) | ORAL | Status: DC
  Administered 2014-04-05 – 2014-04-21 (×26): 25 mg via ORAL
  Filled 2014-04-05 (×35): qty 1

## 2014-04-05 MED ORDER — AMLODIPINE BESYLATE 10 MG PO TABS
10.0000 mg | ORAL_TABLET | Freq: Every day | ORAL | Status: DC
  Administered 2014-04-06 – 2014-04-19 (×14): 10 mg via ORAL
  Filled 2014-04-05 (×15): qty 1

## 2014-04-05 MED ORDER — DARBEPOETIN ALFA-POLYSORBATE 200 MCG/0.4ML IJ SOLN
200.0000 ug | INTRAMUSCULAR | Status: DC
  Filled 2014-04-05: qty 0.4

## 2014-04-05 MED ORDER — HEPARIN SODIUM (PORCINE) 1000 UNIT/ML DIALYSIS
1000.0000 [IU] | INTRAMUSCULAR | Status: DC | PRN
  Administered 2014-04-05: 3000 [IU] via INTRAVENOUS_CENTRAL
  Filled 2014-04-05: qty 3
  Filled 2014-04-05: qty 6

## 2014-04-05 MED ORDER — HYDRALAZINE HCL 50 MG PO TABS
75.0000 mg | ORAL_TABLET | Freq: Three times a day (TID) | ORAL | Status: DC
  Administered 2014-04-05 – 2014-04-06 (×2): 75 mg via ORAL
  Filled 2014-04-05 (×5): qty 1

## 2014-04-05 MED ORDER — PHENYTOIN SODIUM EXTENDED 100 MG PO CAPS
100.0000 mg | ORAL_CAPSULE | Freq: Three times a day (TID) | ORAL | Status: DC
  Administered 2014-04-05 – 2014-04-10 (×13): 100 mg via ORAL
  Filled 2014-04-05 (×17): qty 1

## 2014-04-05 MED ORDER — FENTANYL CITRATE 0.05 MG/ML IJ SOLN
50.0000 ug | INTRAMUSCULAR | Status: DC | PRN
  Administered 2014-04-05 – 2014-04-06 (×5): 50 ug via INTRAVENOUS
  Filled 2014-04-05 (×4): qty 2

## 2014-04-05 MED ORDER — LABETALOL HCL 5 MG/ML IV SOLN
20.0000 mg | INTRAVENOUS | Status: DC | PRN
  Administered 2014-04-05 – 2014-04-07 (×8): 20 mg via INTRAVENOUS
  Filled 2014-04-05 (×9): qty 4

## 2014-04-05 MED ORDER — DIPHENHYDRAMINE HCL 25 MG PO CAPS
25.0000 mg | ORAL_CAPSULE | Freq: Four times a day (QID) | ORAL | Status: DC | PRN
  Administered 2014-04-05 – 2014-04-06 (×2): 25 mg via ORAL
  Filled 2014-04-05 (×2): qty 1

## 2014-04-05 NOTE — Progress Notes (Signed)
PULMONARY / CRITICAL CARE MEDICINE   Name: Matthew Beard MRN: [id number] DOB: 1976-08-26   PCP [name], MD   ADMISSION DATE:  03/29/2014 CONSULTATION DATE:  03/30/14  REFERRING MD :  EDP PRIMARY SERVICE: PCCM  CHIEF COMPLAINT:  Cerebral hemorrhage  BRIEF PATIENT DESCRIPTION:  Mr. Guillory is a 38 y.o. M with PMH significant for ESRD (non-compliant with dialysis), HTN, anemia, NICM, CHF, SOB, who presented to ED on 5/11 after he was at home and per ED notes, fell backwards and began to shake.  His fiances children witnessed the event and called her.  He was in his USOH prior to this episode although fiance notes that his health has not been very good of late.   In ED, initial BP was 209/142 and got as high as 249/144.  Head CT was obtained and revealed a small focal hemorrhage at junction of left parietal and temporal lobes. Pt was intubated due to agitation/attempting to pull at lines/confusion/concern for lack of ability to protect airway. Per pt's fiance, he is non-compliant with his home meds as well as dialysis and she believes that he was supposed to have dialysis today, however, missed it. Of note, pt was recently discharged from [facility] on 4/28 after he was admitted and treated for volume overload and found to have ESRD.   SIGNIFICANT EVENTS / STUDIES:  5/11 - fell backwards at home and began shaking, questionable loss of consciousness. 5/12 Head CT >>> small hemorrhage at junction of left temporal and parietal lobes.  Intubated by EDP for airway protection.  LINES / TUBES: OETT 5/12 >>>5/15 Rt IJ Trialysis cath 03/30/14 >>   CULTURES: Sputum 5/11 >> NOF Urine 5/11 >> NEG Blood 5/11 >> NEG   ANTIBIOTICS: Vanc 5/11 >>5/11 Maxipime 5/11 >>>5/12 Imipenem  5/12 >>5/13 Levaquin 5/13 >> 5/18   EVENTS   03/30/14: Dr Pearlean Brownie recommends SBP 120-160 as goal. Patient very hypertensive and agitated. CT angio head shows no IC aneurysm but has acute left temporal lobe hge. : Remains  hypertensive on propofol and cardene.  Intubated by EDP.    03/31/14: MRI with 1.8cm acute left temporal lobe bleed with multiple micro infarcts and bleed. On diprivan: RASS -3 but doing sbt.   Off cardene. On serveral home bp meds - sbp 111 . ECHO shows sudden drop in EF from 50% in march 2015 tor 25% now. Global hypokinesis   04/01/14: Cards reckons hypertensive "blown out" cardiomyopathy. On CVVH ongoing. Off sedation: not awake enough for extubation though doing SBT  04/02/14: Palliative care meeting: family understands poor long term prognosis. 49 year old son struggling will patient illness but understands gravity of situation. Full code for now including reintubation. Patient agitated but not following commands but good cough and gag. Ok from neuro stand point to extubate. Having hypoglycemia  04/03/14: Extubated 24h ago. Neur concerned for sub clinical seizures, repeat EEG in progress. Agitated intermittently with perioids of following commands. On d10 with sugars now 95    SUBJECTIVE/OVERNIGHT/INTERVAL HX Precedex DC'd this AM. Remains on CRRT. No distress. No new complaints. Poorly oriented  VITAL SIGNS: Temp:  [97.3 F (36.3 C)-98.6 F (37 C)] 98.6 F (37 C) (05/18 1628) Pulse Rate:  [53-102] 86 (05/18 1900) Resp:  [10-27] 17 (05/18 1900) BP: (117-199)/(84-127) 165/110 mmHg (05/18 1900) SpO2:  [98 %-100 %] 98 % (05/18 1900) Weight:  [81.4 kg (179 lb 7.3 oz)] 81.4 kg (179 lb 7.3 oz) (05/18 0400) HEMODYNAMICS:   VENTILATOR SETTINGS:   INTAKE /  OUTPUT: Intake/Output     05/18 0701 - 05/19 0700   P.O. 836   I.V. (mL/kg) 452 (5.6)   IV Piggyback 105   Total Intake(mL/kg) 1393 (17.1)   Urine (mL/kg/hr) 440 (0.4)   Other 225 (0.2)   Total Output 665   Net +728         PHYSICAL EXAMINATION: General: NAD Neuro: Poorly oriented, MAEs HEENT: WNL Cardiovascular: reg, no M Lungs: Clear Abdomen: soft, +BS Ext: warm, symmetric LE edema    LABS: I have reviewed all  of today's lab results. Relevant abnormalities are discussed in the A/P section  CXR: NNF  ASSESSMENT / PLAN:  NEUROLOGIC A:  ICH, likely hypertensive Questionable seizure. P:   Eval and mgmt per Neuro  CARDIOVASCULAR A:  Hypertensive Emergency, controlled NICM, LVEF 25% - likely hypertensive in origin P:  Cont current Rx and PRN anti-hypertensives  PULMONARY A: VDRF due to AMS, resolved P:   Cont supp O2 Recheck CXR 5/19  RENAL A:  ESRD P:   Mgmt per Renal Service   GASTROINTESTINAL A:   Dysphagia after CVA P:   SUP: N/I post extubation D3 diet  HEMATOLOGIC A:   Anemia of renal disease No overt blood loss P:  Monitor BMET intermittently Monitor I/Os Correct electrolytes as indicated  INFECTIOUS A:   Suspected aspiration - treated No overt infection presently P:   Micro and abx as above  ENDOCRINE A:   Hypoglycemia, resolved P:   Decrease D10 Cont diet DC SSI Monitor CBGs   Billy Fischeravid Reinhard Schack, MD ; [facility] service Mobile [phone].  After 5:30 PM or weekends, call [phone]  04/05/2014 8:04 PM

## 2014-04-05 NOTE — Progress Notes (Signed)
eLink Physician-Brief Progress Note Patient Name: Matthew Beard DOB: [birth year] MRN: [id number]  Date of Service  04/05/2014   HPI/Events of Note  Pt remains hypertensive   eICU Interventions  Use prn labetalol   Intervention Category Major Interventions: Hypertension - evaluation and management  Storm Frisk 04/05/2014, 5:29 PM

## 2014-04-05 NOTE — Progress Notes (Signed)
CRRT stopped as ordered, discussed with DR Scherts, ok to hep lock catheter

## 2014-04-05 NOTE — Progress Notes (Signed)
NUTRITION FOLLOW UP/CONSULT  Intervention:    Vanilla Ensure Pudding PO BID and prn, each supplement provides 170 kcal and 4 grams of protein  Nutrition Dx:   Inadequate oral intake related to dysphagia as evidenced by 50% meal completion, ongoing.  Goal:   Patient will meet >/=90% of estimated nutrition needs, unmet.  Monitor:   TF tolerance, weight trends, labs, I/Os, vent status  Assessment:   Pt admitted with convulsions at home. Pt found to have small focal hemorrhage at junction of left parietal and temporal lobes. Pt was intubated due to agitation. Pt has ESRD on HD at home; family reports he is not compliant.  Patient is still receiving CVVHD. Weight approaching EDW.  Discussed patient in ICU rounds today. Plans to continue CRRT for another day. RN reports that patient consumed all of breakfast this morning, but suspects his meal intake may not be as good for lunch today. He consumed 50% of lunch yesterday. Patient with increased nutrient needs for CRRT.   Height: Ht Readings from Last 1 Encounters:  03/30/14 5\' 10"  (1.778 m)    Weight Status:   Wt Readings from Last 1 Encounters:  04/05/14 179 lb 7.3 oz (81.4 kg)   EDW per renal: 79 kg  Re-estimated needs:  Kcal: 2400 Protein: 140-150 gm Fluid: per MD  Skin: intact  Diet Order: Dysphagia 3 with nectar thick liquids   Intake/Output Summary (Last 24 hours) at 04/05/14 1314 Last data filed at 04/05/14 1200  Gross per 24 hour  Intake 2015.93 ml  Output   2177 ml  Net -161.07 ml    Last BM: 5/17   Labs:   Recent Labs Lab 04/03/14 0300  04/04/14 0428 04/04/14 0430 04/04/14 1600 04/05/14 0500  NA 136*  < > 137  --  135* 135*  K 4.0  < > 4.2  --  4.4 4.3  CL 101  < > 103  --  101 101  CO2 26  < > 25  --  25 26  BUN 14  < > 9  --  8 7  CREATININE 2.59*  < > 2.08*  --  1.94* 1.93*  CALCIUM 8.0*  < > 7.4*  --  7.5* 7.6*  MG 2.3  --   --  2.3  --  2.4  PHOS 2.8  < > 2.3  --  2.3 2.3  GLUCOSE 104*   < > 101*  --  91 100*  < > = values in this interval not displayed.  CBG (last 3)   Recent Labs  04/05/14 0411 04/05/14 0750 04/05/14 1202  GLUCAP 96 105* 85    Scheduled Meds: . [START ON 04/06/2014] amLODipine  10 mg Oral Daily  . carvedilol  25 mg Oral BID WC  . darbepoetin (ARANESP) injection - DIALYSIS  100 mcg Intravenous Q Wed-HD  . hydrALAZINE  100 mg Oral 4 times per day  . levETIRAcetam  500 mg Intravenous Q12H  . pantoprazole  40 mg Oral Daily  . phenytoin  100 mg Oral TID    Continuous Infusions: . dextrose 20 mL/hr at 04/05/14 1216  . dialysis replacement fluid (prismasate) 500 mL/hr at 04/05/14 1007  . dialysis replacement fluid (prismasate) 300 mL/hr at 04/05/14 1101  . dialysate (PRISMASATE) 2,000 mL/hr at 04/05/14 1245    Joaquin Courts, RD, LDN, CNSC Pager [phone] After Hours Pager [phone]

## 2014-04-05 NOTE — Progress Notes (Signed)
Speech Language Pathology Treatment: Dysphagia  Patient Details Name: Matthew Beard MRN: [id number] DOB: 04-May-1976 Today's Date: 04/05/2014 Time: 7253-6644 SLP Time Calculation (min): 15 min  Assessment / Plan / Recommendation Clinical Impression  Patient seen at bedside for diagnostic treatment. Po trials provided with improvements in overall function based on documentation from initial evaluation. Patient without overt indication of aspiration with thin liquid trials however audible and immediate multiple swallows followed by changes in respiratory pattern suggestive of a delayed swallow initiation with suspected decrease in airway protection. Overall, appears to be protecting airway with dysphagia 3 solids, nectar thick liquids with SLP providing max assist with feeding and bolus control for small, single bites and sips. Will continue current diet. Prognosis for ability to advance good with improving mentation. Will f/u at bedside. Hopeful for ability to advance without instrumental testing however may need MBS/FEES if continues to present with s/s of suspected silent aspiration.    HPI HPI: Matthew Beard is a 39 y.o. 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  Intubated on admission 5/11. Pt was extubated on 04/02/14. CT brain revealed a focus of parenchymal hemorrhage at the junction of the left temporal and left parietal lobes.       SLP Plan  Continue with current plan of care    Recommendations Diet recommendations: Dysphagia 3 (mechanical soft);Nectar-thick liquid Liquids provided via: Cup;No straw Medication Administration: Whole meds with puree Supervision: Full supervision/cueing for compensatory strategies;Patient able to self feed Compensations: Slow rate;Small  sips/bites;Check for pocketing Postural Changes and/or Swallow Maneuvers: Seated upright 90 degrees              Oral Care Recommendations: Oral care BID Follow up Recommendations: Inpatient Rehab Plan: Continue with current plan of care    GO   [facility] MA, CCC-SLP [phone]   Renardo Cheatum Meryl Kairee Isa 04/05/2014, 11:46 AM

## 2014-04-05 NOTE — Progress Notes (Signed)
Stroke Team Progress Note  HISTORY Matthew Beard is a 38 y.o. 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  Patient was home with his children when suddenly collapsed to the ground and had a generalized convulsion. EMS called and patient brought to the ED where He was confused with SBP 249.  Urgent CT brain revealed a focus of parenchymal hemorrhage at the junction of the left temporal and left parietal lobes which measures 1.6 x 1.6 cm.Mild surrounding edema but no mass effect or IV extension.  PTT 37. INR 1.30. Platelets 198  Patient was not protecting his airway and was intubated in the ED.  Loaded with 1 gram IV keppra. Nicardipine infusion started.   Date last known well: 03/29/14  Time last known well: unclear  tPA Given: no, ICH   SUBJECTIVE No changes. Remains aphasic. No seizures  OBJECTIVE Most recent Vital Signs: Filed Vitals:   04/05/14 0400 04/05/14 0500 04/05/14 0600 04/05/14 0602  BP: 142/90 132/91 148/113 148/113  Pulse: 63 53 57   Temp: 97.3 F (36.3 C)     TempSrc: Oral     Resp: 12 11 12    Height:      Weight: 81.4 kg (179 lb 7.3 oz)     SpO2: 100% 100% 100%    CBG (last 3)   Recent Labs  04/04/14 1941 04/05/14 0005 04/05/14 0411  GLUCAP 94 113* 96    IV Fluid Intake:   . dexmedetomidine 0.7 mcg/kg/hr (04/05/14 0719)  . dextrose 50 mL/hr at 04/05/14 0801  . dextrose    . dialysis replacement fluid (prismasate) 500 mL/hr at 04/05/14 0039  . dialysis replacement fluid (prismasate) 300 mL/hr at 04/04/14 0900  . dialysate (PRISMASATE) 2,000 mL/hr at 04/05/14 0731    MEDICATIONS  . amLODipine  10 mg Per Tube Daily  . carvedilol  25 mg Per Tube BID WC  . darbepoetin (ARANESP) injection - DIALYSIS  100 mcg Intravenous Q Wed-HD  . hydrALAZINE  100 mg  Oral 4 times per day  . levETIRAcetam  500 mg Intravenous Q12H  . levofloxacin (LEVAQUIN) IV  250 mg Intravenous Q24H  . pantoprazole  40 mg Oral Daily  . phenytoin (DILANTIN) IV  100 mg Intravenous 3 times per day   PRN:  sodium chloride, dextrose, heparin, sodium chloride  Diet:  Dysphagia III Nectar thick liquids Activity:  Bedrest DVT Prophylaxis:  SCDs  CLINICALLY SIGNIFICANT STUDIES Basic Metabolic Panel:   Recent Labs Lab 04/04/14 0430 04/04/14 1600 04/05/14 0500  NA  --  135* 135*  K  --  4.4 4.3  CL  --  101 101  CO2  --  25 26  GLUCOSE  --  91 100*  BUN  --  8 7  CREATININE  --  1.94* 1.93*  CALCIUM  --  7.5* 7.6*  MG 2.3  --  2.4  PHOS  --  2.3 2.3   Liver Function Tests:   Recent Labs Lab 03/29/14 2255  04/04/14 1600 04/05/14 0500  AST 20  --   --   --   ALT 19  --   --   --   ALKPHOS 80  --   --   --   BILITOT 0.5  --   --   --   PROT 7.8  --   --   --  ALBUMIN 2.9*  < > 2.1* 2.1*  < > = values in this interval not displayed. CBC:   Recent Labs Lab 04/03/14 0300 04/04/14 0430 04/05/14 0500  WBC 6.0 4.8 5.1  NEUTROABS 4.1  --  3.3  HGB 8.1* 7.4* 7.4*  HCT 25.4* 23.4* 23.6*  MCV 92.7 92.1 92.5  PLT 172 151 157   Coagulation:   Recent Labs Lab 03/30/14 0052  LABPROT 15.9*  INR 1.30   Cardiac Enzymes:   Recent Labs Lab 03/30/14 1555 03/30/14 2330 03/31/14 0430 04/02/14 0310  CKTOTAL  --   --  75 176  CKMB  --   --  2.1 3.5  TROPONINI 0.51* 0.80* 0.71*  --    Urinalysis:   Recent Labs Lab 03/29/14 2251  COLORURINE YELLOW  LABSPEC 1.019  PHURINE 6.5  GLUCOSEU NEGATIVE  HGBUR LARGE*  BILIRUBINUR NEGATIVE  KETONESUR NEGATIVE  PROTEINUR >300*  UROBILINOGEN 1.0  NITRITE NEGATIVE  LEUKOCYTESUR NEGATIVE   Lipid Panel    Component Value Date/Time   CHOL 187 01/22/2012 0515   TRIG 236* 04/02/2014 0309   HDL 38* 01/22/2012 0515   CHOLHDL 4.9 01/22/2012 0515   VLDL 25 01/22/2012 0515   LDLCALC 124* 01/22/2012 0515   HgbA1C   Lab Results  Component Value Date   HGBA1C 5.3 03/08/2014    Urine Drug Screen:     Component Value Date/Time   LABOPIA NONE DETECTED 03/29/2014 2251   LABOPIA NEGATIVE 03/08/2014 2108   COCAINSCRNUR NONE DETECTED 03/29/2014 2251   COCAINSCRNUR NEGATIVE 03/08/2014 2108   LABBENZ NONE DETECTED 03/29/2014 2251   LABBENZ NEGATIVE 03/08/2014 2108   AMPHETMU NONE DETECTED 03/29/2014 2251   AMPHETMU NEGATIVE 03/08/2014 2108   THCU NONE DETECTED 03/29/2014 2251   LABBARB NONE DETECTED 03/29/2014 2251    Alcohol Level: No results found for this basename: ETH,  in the last 168 hours  Ct Head Wo Contrast 03/29/2014    Significantly limited exam by patient motion artifact.  A focus of hemorrhage is noted as described above at the junction of the left temporal and parietal lobes. Mild surrounding edema is noted.      Dg Chest Port 1 View 03/30/2014 Endotracheal tube as described.  Increasing bilateral infiltrative changes.   Electronically Signed   By: Alcide Clever M.D.   On: 03/30/2014 00:21   Dg Chest Port 1 View 03/29/2014    Cardiac enlargement.  No evidence of active pulmonary disease.    CT Angio of head 03/30/2014 1. No intracranial aneurysm identified. 2. Mild anterior and posterior circulation branches vessel irregularity, suggestive of mild intracranial atherosclerosis. No evidence of significant proximal stenosis. 3. Unchanged, acute left temporal lobe parenchymal hemorrhage.  EEG Abnormalities:  1) Triphasic waves  2) generalized slow activity  Clinical Interpretation: This EEG is consistent with a generalized nonspecific cerebral dysfunction. Triphasic waves, nonspecific, can be associated with metabolic encephalopathy such as renal failure.  2D Echocardiogram  Left ventricle: The cavity size was normal. Wall thickness was increased in a pattern of severe LVH.  Systolic function was severely reduced. The estimated ejection fraction was in the range of 25% to 30%.  Severe  hypokinesis of the entire myocardium.  Carotid Doppler  Not done  CXR   IMPRESSION:  1. Stable positioning of support apparatus. No pneumothorax.  2. Marked improved aeration of the lungs suggests resolving edema  and atelectasis.  EKG  Normal sinus rhythm rate 96 beats per minute.  For complete results please see formal report.  Therapy Recommendations  pending  Physical Exam General: The patient is sedated, unable to cooperate significantly at the time of the examination. Skin: No significant peripheral edema is noted.  Neurologic Exam Mental status: the patient is extubated, minimally verbal, sedated, not able to follow commands. Cranial nerves: Facial symmetry is present. Speech is whispery, difficult to understand. He will blink to threat bilaterally, will not track objects well. Motor: The patient has symmetric motor tone on all 4's, the patient is not following commands for full motor testing, no obvious hemiparesis. He has intermittent tremors of the arms bilaterally. Sensory examination: Pain stimulation appears to result in a symmetric response on the arms and legs. Coordination: The patient is unable to follow commands for cerebellar testing. Gait and station: The gait could not be tested. Reflexes: Deep tendon reflexes are symmetric, but are hyper.   ASSESSMENT [name] is a 38 y.o. male presenting with a witnessed seizure and collapse. TPA was not initiated secondary to hemorrhage. Head CT - a focus of hemorrhage is noted at the junction of the left temporal and parietal lobes.On no atherothrombotics prior to admission. Now on no atherothrombotics for secondary stroke prevention due to hemorrhagic stroke. Patient with resultant unresponsiveness. Stroke work up underway.   Hypertension  History of noncompliance with medications  ESRD - HD  Nonischemic cardiomyopathy. Ejection fraction 25-30%.  History of lacunar infarcts  Anemia   Hospital day #  7  The patient is not responding well, some tremors seen, may have a concern for subclinical seizures. At baseline, the patient is cognitively slow.  TREATMENT/PLAN  No antithrombotics at this time for secondary stroke prevention due to hemorrhagic stroke.  Await carotid Dopplers  Keep systolic blood pressure less than 160  On dilantin and keppra will check dilantin levels in am  Prolonged EEG study No epileptiform discharges are seen, full report to follow.   Annie MainSHARON BIBY, MSN, RN, ANVP-BC, ANP-BC, Lawernce IonGNP-BC [facility] Pager: [phone] 04/05/2014 8:24 AM  I have personally obtained a history, examined the patient, evaluated imaging results, and formulated the assessment and plan of care. I agree with the above.    Delia HeadyPramod Babara [place], MD  To contact Stroke Continuity provider, please refer to [email or web address]. After hours, contact General Neurology

## 2014-04-05 NOTE — Progress Notes (Signed)
Stroke Team Progress Note  HISTORY Matthew Beard is a 38 y.o. 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  Patient was home with his children when suddenly collapsed to the ground and had a generalized convulsion. EMS called and patient brought to the ED where He was confused with SBP 249.  Urgent CT brain revealed a focus of parenchymal hemorrhage at the junction of the left temporal and left parietal lobes which measures 1.6 x 1.6 cm.Mild surrounding edema but no mass effect or IV extension.  PTT 37. INR 1.30. Platelets 198  Patient was not protecting his airway and was intubated in the ED.  Loaded with 1 gram IV keppra. Nicardipine infusion started.   Date last known well: 03/29/14  Time last known well: unclear  tPA Given: no, ICH   SUBJECTIVE No family at bedside. The patient is awake, but he is minimally verbal. Not following commands. On HD this AM  OBJECTIVE Most recent Vital Signs: Filed Vitals:   04/05/14 1000 04/05/14 1100 04/05/14 1200 04/05/14 1628  BP: 125/90 133/87 151/111   Pulse: 64 61 78   Temp:   97.4 F (36.3 C) 98.6 F (37 C)  TempSrc:   Oral Oral  Resp: 14 14 27    Height:      Weight:      SpO2: 100% 100% 100%    CBG (last 3)   Recent Labs  04/05/14 0750 04/05/14 1202 04/05/14 1609  GLUCAP 105* 85 124*    IV Fluid Intake:   . dextrose 20 mL/hr at 04/05/14 1216    MEDICATIONS  . [START ON 04/06/2014] amLODipine  10 mg Oral Daily  . carvedilol  25 mg Oral BID WC  . [START ON 04/07/2014] darbepoetin (ARANESP) injection - DIALYSIS  200 mcg Intravenous Q Wed-HD  . feeding supplement (ENSURE)  1 Container Oral BID  . hydrALAZINE  75 mg Oral 3 times per day  . levETIRAcetam  500 mg Intravenous Q12H  . pantoprazole  40 mg Oral Daily  . phenytoin   100 mg Oral TID   PRN:  sodium chloride, fentaNYL  Diet:  Dysphagia III Nectar thick liquids Activity:  Bedrest DVT Prophylaxis:  SCDs  CLINICALLY SIGNIFICANT STUDIES Basic Metabolic Panel:   Recent Labs Lab 04/04/14 0430 04/04/14 1600 04/05/14 0500  NA  --  135* 135*  K  --  4.4 4.3  CL  --  101 101  CO2  --  25 26  GLUCOSE  --  91 100*  BUN  --  8 7  CREATININE  --  1.94* 1.93*  CALCIUM  --  7.5* 7.6*  MG 2.3  --  2.4  PHOS  --  2.3 2.3   Liver Function Tests:   Recent Labs Lab 03/29/14 2255  04/04/14 1600 04/05/14 0500  AST 20  --   --   --   ALT 19  --   --   --   ALKPHOS 80  --   --   --   BILITOT 0.5  --   --   --   PROT 7.8  --   --   --   ALBUMIN 2.9*  < > 2.1* 2.1*  < > = values in this interval not displayed. CBC:   Recent Labs Lab 04/03/14 0300 04/04/14 0430  04/05/14 0500  WBC 6.0 4.8 5.1  NEUTROABS 4.1  --  3.3  HGB 8.1* 7.4* 7.4*  HCT 25.4* 23.4* 23.6*  MCV 92.7 92.1 92.5  PLT 172 151 157   Coagulation:   Recent Labs Lab 03/30/14 0052  LABPROT 15.9*  INR 1.30   Cardiac Enzymes:   Recent Labs Lab 03/30/14 1555 03/30/14 2330 03/31/14 0430 04/02/14 0310  CKTOTAL  --   --  75 176  CKMB  --   --  2.1 3.5  TROPONINI 0.51* 0.80* 0.71*  --    Urinalysis:   Recent Labs Lab 03/29/14 2251  COLORURINE YELLOW  LABSPEC 1.019  PHURINE 6.5  GLUCOSEU NEGATIVE  HGBUR LARGE*  BILIRUBINUR NEGATIVE  KETONESUR NEGATIVE  PROTEINUR >300*  UROBILINOGEN 1.0  NITRITE NEGATIVE  LEUKOCYTESUR NEGATIVE   Lipid Panel    Component Value Date/Time   CHOL 187 01/22/2012 0515   TRIG 236* 04/02/2014 0309   HDL 38* 01/22/2012 0515   CHOLHDL 4.9 01/22/2012 0515   VLDL 25 01/22/2012 0515   LDLCALC 124* 01/22/2012 0515   HgbA1C  Lab Results  Component Value Date   HGBA1C 5.3 03/08/2014    Urine Drug Screen:     Component Value Date/Time   LABOPIA NONE DETECTED 03/29/2014 2251   LABOPIA NEGATIVE 03/08/2014 2108   COCAINSCRNUR NONE DETECTED 03/29/2014  2251   COCAINSCRNUR NEGATIVE 03/08/2014 2108   LABBENZ NONE DETECTED 03/29/2014 2251   LABBENZ NEGATIVE 03/08/2014 2108   AMPHETMU NONE DETECTED 03/29/2014 2251   AMPHETMU NEGATIVE 03/08/2014 2108   THCU NONE DETECTED 03/29/2014 2251   LABBARB NONE DETECTED 03/29/2014 2251    Alcohol Level: No results found for this basename: ETH,  in the last 168 hours  Ct Head Wo Contrast 03/29/2014    Significantly limited exam by patient motion artifact.  A focus of hemorrhage is noted as described above at the junction of the left temporal and parietal lobes. Mild surrounding edema is noted.      Dg Chest Port 1 View 03/30/2014 Endotracheal tube as described.  Increasing bilateral infiltrative changes.   Electronically Signed   By: Alcide Clever M.D.   On: 03/30/2014 00:21   Dg Chest Port 1 View 03/29/2014    Cardiac enlargement.  No evidence of active pulmonary disease.    CT Angio of head 03/30/2014 1. No intracranial aneurysm identified. 2. Mild anterior and posterior circulation branches vessel irregularity, suggestive of mild intracranial atherosclerosis. No evidence of significant proximal stenosis. 3. Unchanged, acute left temporal lobe parenchymal hemorrhage.  EEG Abnormalities:  1) Triphasic waves  2) generalized slow activity  Clinical Interpretation: This EEG is consistent with a generalized nonspecific cerebral dysfunction. Triphasic waves, nonspecific, can be associated with metabolic encephalopathy such as renal failure.    2D Echocardiogram  Left ventricle: The cavity size was normal. Wall thickness was increased in a pattern of severe LVH.  Systolic function was severely reduced. The estimated ejection fraction was in the range of 25% to 30%.  Severe hypokinesis of the entire myocardium.   Carotid Doppler  pending  CXR   IMPRESSION:  1. Stable positioning of support apparatus. No pneumothorax.  2. Marked improved aeration of the lungs suggests resolving edema  and  atelectasis.  EKG  Normal sinus rhythm rate 96 beats per minute.  For complete results please see formal report.   Therapy Recommendations pending  Physical Exam  General: The patient is sedated, unable to cooperate significantly at the time of the examination.  Skin: No significant peripheral edema is noted.   Neurologic Exam  Mental status: the patient is awake, minimally verbal, globally aphasic not able to follow commands.  Cranial nerves: Facial symmetry is present. Speech is whispery, difficult to understand. He will blink to threat bilaterally, will not track objects well.  Motor: The patient has symmetric motor tone on all 4's, the patient is not following commands for full motor testing, no obvious hemiparesis. He has intermittent tremors of the arms bilaterally.  Sensory examination: Pain stimulation appears to result in a symmetric response on the arms and legs.  Coordination: The patient is unable to follow commands for cerebellar testing.  Gait and station: The gait could not be tested.  Reflexes: Deep tendon reflexes are symmetric, but are hyper.   ASSESSMENT Matthew Beard is a 38 y.o. male presenting with a witnessed seizure and collapse. TPA was not initiated secondary to hemorrhage. Head CT - a focus of hemorrhage is noted at the junction of the left temporal and parietal lobes.On no atherothrombotics prior to admission. Now on no atherothrombotics for secondary stroke prevention due to hemorrhagic stroke. Patient with resultant unresponsiveness. Stroke work up underway.   Hypertension  History of noncompliance with medications  ESRD - HD  Nonischemic cardiomyopathy. Ejection fraction 25-30%.  History of lacunar infarcts  Anemia   Hospital day # 7  The patient is not responding well, some tremors seen, may have a concern for subclinical seizures. At baseline, the patient is cognitively slow.  TREATMENT/PLAN  No antithrombotics at this time  for secondary stroke prevention due to hemorrhagic stroke.  Await carotid Dopplers  Keep systolic blood pressure less than 160  On dilantin and keppra will check dilantin levels in am  Prolonged EEG study No epileptiform discharges are seen, full report to follow.     04/05/2014, 5:07 PM   Micki RileyPramod S Sethi [phone]      To contact Stroke Continuity provider, please refer to [email or web address]. After hours, contact General Neurology

## 2014-04-05 NOTE — Progress Notes (Signed)
  [facility] Progress Note   Subjective: Alert, no complaints  Filed Vitals:   04/05/14 1000 04/05/14 1100 04/05/14 1200 04/05/14 1628  BP: 125/90 133/87 151/111   Pulse: 64 61 78   Temp:   97.4 F (36.3 C) 98.6 F (37 C)  TempSrc:   Oral Oral  Resp: 14 14 27    Height:      Weight:      SpO2: 100% 100% 100%    Exam: No distress No jvd Chest clear bilat RRR no MRG Abd soft, NTND, no ascites 1+ LE edema L AVF patent  HD: MWF [place] 4h  79kg  2/2.25 [place]  300/800   Heparin 8000  LUA AVF Hectorol 4  Aranesp 100 Q Wed   Venofer 100/hd thru 5/20 Last tsat 12%     Assessment: 1 Seizure / fall - due to acute L temporal-parietal bleed 2 NICM EF 25-30% 3 ESRD on HD 4 Anemia Hb 7's on aranesp 100/wk 5 HPTH renagel on hold, Ca/phos stable, no meds 6 HTN BP normal on 3 bp meds 7 Volume is up 2-3kg  Plan- stop CRRT today, transition to intermittent HD, next HD on Wed. Decrease volume with HD    Vinson Moselle MD  pager [phone]    cell [phone]  04/05/2014, 4:34 PM     Recent Labs Lab 04/04/14 0428 04/04/14 1600 04/05/14 0500  NA 137 135* 135*  K 4.2 4.4 4.3  CL 103 101 101  CO2 25 25 26   GLUCOSE 101* 91 100*  BUN 9 8 7   CREATININE 2.08* 1.94* 1.93*  CALCIUM 7.4* 7.5* 7.6*  PHOS 2.3 2.3 2.3    Recent Labs Lab 03/29/14 2255  04/04/14 0428 04/04/14 1600 04/05/14 0500  AST 20  --   --   --   --   ALT 19  --   --   --   --   ALKPHOS 80  --   --   --   --   BILITOT 0.5  --   --   --   --   PROT 7.8  --   --   --   --   ALBUMIN 2.9*  < > 2.1* 2.1* 2.1*  < > = values in this interval not displayed.  Recent Labs Lab 04/02/14 0309 04/03/14 0300 04/04/14 0430 04/05/14 0500  WBC 8.1 6.0 4.8 5.1  NEUTROABS 5.8 4.1  --  3.3  HGB 8.0* 8.1* 7.4* 7.4*  HCT 25.1* 25.4* 23.4* 23.6*  MCV 90.6 92.7 92.1 92.5  PLT 176 172 151 157   . [START ON 04/06/2014] amLODipine  10 mg Oral Daily  . carvedilol  25 mg Oral BID WC  . darbepoetin (ARANESP)  injection - DIALYSIS  100 mcg Intravenous Q Wed-HD  . feeding supplement (ENSURE)  1 Container Oral BID  . hydrALAZINE  100 mg Oral 4 times per day  . levETIRAcetam  500 mg Intravenous Q12H  . pantoprazole  40 mg Oral Daily  . phenytoin  100 mg Oral TID   . dextrose 20 mL/hr at 04/05/14 1216  . dialysis replacement fluid (prismasate) 500 mL/hr at 04/05/14 1007  . dialysis replacement fluid (prismasate) 300 mL/hr at 04/05/14 1101  . dialysate (PRISMASATE) 2,000 mL/hr at 04/05/14 1522   sodium chloride, fentaNYL, heparin, sodium chloride

## 2014-04-05 NOTE — Evaluation (Signed)
Speech Language Pathology Evaluation Patient Details Name: Matthew Beard MRN: [id number] DOB: 09-Jul-1976 Today's Date: 04/05/2014 Time: 1100-1115 SLP Time Calculation (min): 15 min  Problem List:  Patient Active Problem List   Diagnosis Date Noted  . Noncompliance with medication regimen in past- mult adm for uncontrolled HTN 03/31/2014  . Intracerebral hemorrhage 03/30/2014  . ICH (intracerebral hemorrhage) 03/30/2014  . Acute respiratory failure with hypoxia 03/30/2014  . Acute diastolic heart failure 03/10/2014  . ESRD- HD since April 2015 03/08/2014  . Pulmonary edema 02/11/2014  . Dyspnea 02/11/2014  . Pre-operative cardiovascular examination 10/06/2013  . Cardiomyopathy EF 25-30%- etiol undetermined- EF varies- 20% Aug 2014, 40% Nov 2014, 50% March 2015 09/11/2013  . Epistaxis 09/11/2013  . Hypertensive emergency 09/11/2013  . Autoimmune disorder 09/11/2013  . History of noncompliance with medical treatment 07/04/2013  . Cerebrovascular disease 07/04/2013  . Uncontrolled hypertension 08/10/2011  . Anemia in chronic kidney disease 08/10/2011  . Mixed connective tissue disease 08/10/2011   Past Medical History:  Past Medical History  Diagnosis Date  . Hypertension   . CKD (chronic kidney disease), stage IV   . Normocytic anemia   . Pericardial effusion     Remote hx  . Cerebrovascular disease     Chronic lacunar infarcts  . NICM (nonischemic cardiomyopathy)   . CHF (congestive heart failure)   . Shortness of breath    Past Surgical History:  Past Surgical History  Procedure Laterality Date  . Av fistula placement Left 10/19/2013    Procedure: ARTERIOVENOUS (AV) FISTULA CREATION- LEFT RADIOCEPHALIC;  Surgeon: Larina Earthly, MD;  Location: [facility] OR;  Service: Vascular;  Laterality: Left;   HPI:  Matthew Beard is a 38 y.o. male with a past medical history significant for HTN, CKD stage IV on HD, chronic congestive heart failure, ischemic stroke without residual  deficits, poor adherence to treatment, brought in by ambulance after sustaining a witnessed seizure at home.  Intubated on admission 5/11. Pt was extubated on 04/02/14. CT brain revealed a focus of parenchymal hemorrhage at the junction of the left temporal and left parietal lobes.    Assessment / Plan / Recommendation Clinical Impression  Patient presents with cognitive-linguistic impairements impacting verbal expression, auditory comprehension, attention, awareness, memory, problem solving, and reasoning. Although global aphasia noted, suspect that severity of deficits is exacerbated by very poor attention. Responds moderately well to verbal cueing (phonemic, sentence completion) from SLP for improved verbal output. Will benefit from continued SLP f/u in the acute care setting as well as CIR when medically stable.     SLP Assessment  Patient needs continued Speech Lanaguage Pathology Services    Follow Up Recommendations  Inpatient Rehab    Frequency and Duration min 3x week  2 weeks   Pertinent Vitals/Pain n/a   SLP Goals  SLP Goals Potential to Achieve Goals: Good Progress/Goals/Alternative treatment plan discussed with pt/caregiver and they: Agree  SLP Evaluation Prior Functioning  Cognitive/Linguistic Baseline: Information not available (however suspect WFL)   Cognition  Overall Cognitive Status: Impaired/Different from baseline Arousal/Alertness: Lethargic Orientation Level: Oriented to person;Disoriented to place;Disoriented to time;Disoriented to situation Attention: Focused Focused Attention: Impaired Focused Attention Impairment: Verbal basic;Functional basic Memory: Impaired Memory Impairment: Storage deficit;Decreased recall of new information Awareness: Impaired Awareness Impairment: Intellectual impairment Problem Solving: Impaired Problem Solving Impairment: Verbal basic;Functional basic Safety/Judgment: Impaired    Comprehension  Auditory  Comprehension Overall Auditory Comprehension: Impaired Yes/No Questions: Impaired Basic Biographical Questions: 51-75% accurate Commands: Impaired One Step Basic  Commands: 0-24% accurate Interfering Components: Attention EffectiveTechniques: Visual/Gestural cues;Repetition;Extra processing time Visual Recognition/Discrimination Discrimination: Not tested Reading Comprehension Reading Status: Not tested    Expression Expression Primary Mode of Expression: Verbal Verbal Expression Overall Verbal Expression: Impaired Initiation: Impaired Automatic Speech: Social Response Level of Generative/Spontaneous Verbalization: Word Repetition: No impairment Naming: Impairment Responsive: 0-25% accurate Confrontation: Impaired Convergent: 0-24% accurate Divergent: 0-24% accurate Verbal Errors: Perseveration;Semantic paraphasias Pragmatics: Impairment Impairments: Eye contact;Monotone Interfering Components: Attention Effective Techniques: Phonemic cues;Sentence completion   Oral / Motor Oral Motor/Sensory Function Overall Oral Motor/Sensory Function: Appears within functional limits for tasks assessed (no assymetry noted, difficulty following commands) Motor Speech Overall Motor Speech: Appears within functional limits for tasks assessed   GO   Matthew LangoLeah Finneus Kaneshiro MA, CCC-SLP [phone]   Matthew Beard Matthew Beard 04/05/2014, 12:03 PM

## 2014-04-06 ENCOUNTER — Inpatient Hospital Stay (HOSPITAL_COMMUNITY): Payer: Medicaid Other

## 2014-04-06 LAB — BASIC METABOLIC PANEL
BUN: 11 mg/dL (ref 6–23)
CHLORIDE: 105 meq/L (ref 96–112)
CO2: 25 meq/L (ref 19–32)
CREATININE: 2.87 mg/dL — AB (ref 0.50–1.35)
Calcium: 7.7 mg/dL — ABNORMAL LOW (ref 8.4–10.5)
GFR calc Af Amer: 30 mL/min — ABNORMAL LOW (ref >90)
GFR calc non Af Amer: 26 mL/min — ABNORMAL LOW (ref >90)
GLUCOSE: 89 mg/dL (ref 70–99)
Potassium: 4 mEq/L (ref 3.7–5.3)
Sodium: 139 mEq/L (ref 137–147)

## 2014-04-06 LAB — CBC
HEMATOCRIT: 25 % — AB (ref 39.0–52.0)
HEMOGLOBIN: 7.6 g/dL — AB (ref 13.0–17.0)
MCH: 28.9 pg (ref 26.0–34.0)
MCHC: 30.4 g/dL (ref 30.0–36.0)
MCV: 95.1 fL (ref 78.0–100.0)
PLATELETS: 206 10*3/uL (ref 150–400)
RBC: 2.63 MIL/uL — AB (ref 4.22–5.81)
RDW: 18.4 % — ABNORMAL HIGH (ref 11.5–15.5)
WBC: 8.6 10*3/uL (ref 4.0–10.5)

## 2014-04-06 LAB — GLUCOSE, CAPILLARY
GLUCOSE-CAPILLARY: 86 mg/dL (ref 70–99)
Glucose-Capillary: 102 mg/dL — ABNORMAL HIGH (ref 70–99)
Glucose-Capillary: 90 mg/dL (ref 70–99)

## 2014-04-06 MED ORDER — LIDOCAINE HCL (PF) 1 % IJ SOLN: 5.0000 mL | INTRAMUSCULAR | Status: DC | PRN

## 2014-04-06 MED ORDER — HYDRALAZINE HCL 20 MG/ML IJ SOLN
10.0000 mg | INTRAMUSCULAR | Status: DC | PRN
  Administered 2014-04-06 – 2014-04-20 (×8): 10 mg via INTRAVENOUS
  Filled 2014-04-06 (×8): qty 1

## 2014-04-06 MED ORDER — ALTEPLASE 2 MG IJ SOLR
2.0000 mg | Freq: Once | INTRAMUSCULAR | Status: DC | PRN
  Filled 2014-04-06: qty 2

## 2014-04-06 MED ORDER — SODIUM CHLORIDE 0.9 % IV SOLN: 100.0000 mL | INTRAVENOUS | Status: DC | PRN

## 2014-04-06 MED ORDER — HYDRALAZINE HCL 50 MG PO TABS
100.0000 mg | ORAL_TABLET | Freq: Three times a day (TID) | ORAL | Status: DC
  Administered 2014-04-06 – 2014-04-21 (×39): 100 mg via ORAL
  Filled 2014-04-06 (×48): qty 2

## 2014-04-06 MED ORDER — WHITE PETROLATUM GEL
Status: AC
  Administered 2014-04-06: 0.2
  Filled 2014-04-06: qty 5

## 2014-04-06 MED ORDER — LIDOCAINE-PRILOCAINE 2.5-2.5 % EX CREA: 1.0000 "application " | TOPICAL_CREAM | CUTANEOUS | Status: DC | PRN

## 2014-04-06 MED ORDER — NEPRO/CARBSTEADY PO LIQD: 237.0000 mL | ORAL | Status: DC | PRN

## 2014-04-06 MED ORDER — HEPARIN SODIUM (PORCINE) 1000 UNIT/ML DIALYSIS: 1000.0000 [IU] | INTRAMUSCULAR | Status: DC | PRN

## 2014-04-06 MED ORDER — FENTANYL CITRATE 0.05 MG/ML IJ SOLN
25.0000 ug | INTRAMUSCULAR | Status: DC | PRN
  Administered 2014-04-06: 25 ug via INTRAVENOUS
  Filled 2014-04-06: qty 2

## 2014-04-06 MED ORDER — FENTANYL CITRATE 0.05 MG/ML IJ SOLN
INTRAMUSCULAR | Status: AC
  Administered 2014-04-06: 50 ug via INTRAVENOUS
  Filled 2014-04-06: qty 2

## 2014-04-06 MED ORDER — CLONIDINE HCL 0.2 MG PO TABS
0.2000 mg | ORAL_TABLET | Freq: Three times a day (TID) | ORAL | Status: DC
  Administered 2014-04-06 – 2014-04-07 (×2): 0.2 mg via ORAL
  Filled 2014-04-06 (×5): qty 1

## 2014-04-06 MED ORDER — PENTAFLUOROPROP-TETRAFLUOROETH EX AERO: 1.0000 "application " | INHALATION_SPRAY | CUTANEOUS | Status: DC | PRN

## 2014-04-06 NOTE — Progress Notes (Signed)
eLink Physician-Brief Progress Note Patient Name: Matthew Beard DOB: 1976/11/16 MRN: [id number]  Date of Service  04/06/2014   HPI/Events of Note  Not responding to labetolol to goals HR 95  eICU Interventions  Attempt IB IV hydralzine   Intervention Category Intermediate Interventions: Hypertension - evaluation and management  Nelda Bucks 04/06/2014, 2:35 AM

## 2014-04-06 NOTE — Progress Notes (Signed)
Family updated with new room. Report  Given to [facility]. Relayed  that patient continues to be hypertensive, with baseline BP 180-190's'S even with scheduled med's and prns. MD aware and clonidine restarted. Medications have been adjusted accordingly

## 2014-04-06 NOTE — Progress Notes (Signed)
Patient [name]      DOB: 01-Jul-1976      ZJQ:[id number]  Goals per family meeting were to attempt to improve condition and rehab.  Have been shadowing with you as we see what Matthew Beard can do.  Updated his brother by phone yesterday.  Will likely need LTAC stay at [place].  Don't think patient can participate in his own goals of care at this time.  He does not appear to have full capacity will continue to follow with you.   Matthew Sias L. Ladona Ridgel, MD MBA The Palliative Medicine Team at [facility] Phone: [phone] Pager: [phone]

## 2014-04-06 NOTE — Progress Notes (Signed)
Speech Language Pathology Treatment: Dysphagia  Patient Details Name: Matthew Beard MRN: [id number] DOB: 1976/05/03 Today's Date: 04/06/2014 Time: 1530-1600 SLP Time Calculation (min): 30 min  Assessment / Plan / Recommendation Clinical Impression  Pt has transferred to 6E, and was noted to be lethargic.  Pt poorly responsive to tactile and verbal stimulation, and to cold wet cloth.  Suction was set up at bedside, and oral care was completed by SLP. Pt required encouragement to open mouth for oral care. Voice quality was noted to be clear (not wet). Pt kept his eyes closed the entire time, and was unable to verbalize or follow commands.  PO trials were deferred at this time, as all attempts to awaken/arouse pt were unsuccessful.  Precautions posted at [facility] were updated to include feeding pt only when ALERT. ST to continue to follow for po intake appropriateness, and readiness to advance diet. RN aware.   HPI HPI: Matthew Beard is a 38 y.o. 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  Intubated on admission 5/11. Pt was extubated on 04/02/14. CT brain revealed a focus of parenchymal hemorrhage at the junction of the left temporal and left parietal lobes.  BSE recommended dys 3/Nectar thick liquids. ST in for follow up to determine readiness to advance diet.   Pertinent Vitals VSS  SLP Plan  Continue with current plan of care    Recommendations Diet recommendations: NPO (unless fully awake and alert)              Oral Care Recommendations: Oral care BID Follow up Recommendations: Inpatient Rehab Plan: Continue with current plan of care    GO    Tsion Inghram B. Murvin Natal [facility], CCC-SLP [phone]  Gray Bernhardt 04/06/2014, 4:00 PM

## 2014-04-06 NOTE — Progress Notes (Signed)
  [facility] Progress Note   Subjective: Alert, no complaints  Filed Vitals:   04/06/14 0330 04/06/14 0400 04/06/14 0430 04/06/14 0500  BP: 158/105 176/114 152/113 170/117  Pulse: 95 95 87 91  Temp:  97.7 F (36.5 C)    TempSrc:  Oral    Resp: 20 20 20 25   Height:      Weight:  82.1 kg (181 lb)    SpO2: 96% 97% 97% 97%   Exam: No distress No jvd Chest clear bilat RRR no MRG Abd soft, NTND, no ascites 1-2+ LE edema L AVF patent Neuro moves all ext equally, Ox 2  HD: MWF [place] 4h  79kg  2/2.25 Bath  300/800   Heparin none due to bleed (was 8000)  LUA AVF Hectorol 4  Aranesp 100 Q Wed   Venofer 100/hd thru 5/20 Last tsat 12%     Assessment: 1 Acute CVA - left temporal lobe, hemorrhagic 2 NICM EF 25-30% 3 ESRD on HD 4 Anemia Hb 7's on aranesp 100/wk 5 HPTH renagel on hold, Ca/phos stable, no meds 6 HTN uncontrolled / volume excess - BP's high, needs fluid off  Plan- HD today, lower volume, getting IV hydralazine prn    Vinson Moselle MD  pager [phone]    cell [phone]  04/06/2014, 5:57 AM     Recent Labs Lab 04/04/14 0428 04/04/14 1600 04/05/14 0500 04/06/14 0510  NA 137 135* 135* 139  K 4.2 4.4 4.3 4.0  CL 103 101 101 105  CO2 25 25 26 25   GLUCOSE 101* 91 100* 89  BUN 9 8 7 11   CREATININE 2.08* 1.94* 1.93* PENDING  CALCIUM 7.4* 7.5* 7.6* 7.7*  PHOS 2.3 2.3 2.3  --     Recent Labs Lab 04/04/14 0428 04/04/14 1600 04/05/14 0500  ALBUMIN 2.1* 2.1* 2.1*    Recent Labs Lab 04/02/14 0309 04/03/14 0300 04/04/14 0430 04/05/14 0500  WBC 8.1 6.0 4.8 5.1  NEUTROABS 5.8 4.1  --  3.3  HGB 8.0* 8.1* 7.4* 7.4*  HCT 25.1* 25.4* 23.4* 23.6*  MCV 90.6 92.7 92.1 92.5  PLT 176 172 151 157   . amLODipine  10 mg Oral Daily  . carvedilol  25 mg Oral BID WC  . [START ON 04/07/2014] darbepoetin (ARANESP) injection - DIALYSIS  200 mcg Intravenous Q Wed-HD  . feeding supplement (ENSURE)  1 Container Oral BID  . hydrALAZINE  75 mg Oral 3  times per day  . levETIRAcetam  500 mg Intravenous Q12H  . phenytoin  100 mg Oral TID   . dextrose 20 mL/hr at 04/05/14 1216   sodium chloride, diphenhydrAMINE, fentaNYL, hydrALAZINE, labetalol

## 2014-04-06 NOTE — Progress Notes (Signed)
Stroke Team Progress Note  HISTORY Matthew Beard is a 38 y.o. 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  Patient was home with his children when suddenly collapsed to the ground and had a generalized convulsion. EMS called and patient brought to the ED where He was confused with SBP 249.  Urgent CT brain revealed a focus of parenchymal hemorrhage at the junction of the left temporal and left parietal lobes which measures 1.6 x 1.6 cm.Mild surrounding edema but no mass effect or IV extension.  PTT 37. INR 1.30. Platelets 198  Patient was not protecting his airway and was intubated in the ED.  Loaded with 1 gram IV keppra. Nicardipine infusion started.   Date last known well: 03/29/14  Time last known well: unclear  tPA Given: no, ICH   SUBJECTIVE No family at bedside. On HD this AM. Prolonged EEG showed no seizure activity.   OBJECTIVE Most recent Vital Signs: Filed Vitals:   04/06/14 1000 04/06/14 1030 04/06/14 1100 04/06/14 1116  BP: 179/115 171/114 182/107 166/113  Pulse: 99 96 86 97  Temp:    98.4 F (36.9 C)  TempSrc:    Oral  Resp: 20 19 18 16   Height:      Weight:    170 lb 13.7 oz (77.5 kg)  SpO2:    100%   CBG (last 3)   Recent Labs  04/05/14 2016 04/06/14 0011 04/06/14 0411  GLUCAP 94 102* 90    IV Fluid Intake:   . dextrose 20 mL/hr at 04/05/14 1216    MEDICATIONS  . amLODipine  10 mg Oral Daily  . carvedilol  25 mg Oral BID WC  . [START ON 04/07/2014] darbepoetin (ARANESP) injection - DIALYSIS  200 mcg Intravenous Q Wed-HD  . feeding supplement (ENSURE)  1 Container Oral BID  . hydrALAZINE  75 mg Oral 3 times per day  . levETIRAcetam  500 mg Intravenous Q12H  . phenytoin  100 mg Oral TID   PRN:  sodium chloride, diphenhydrAMINE, fentaNYL,  hydrALAZINE, labetalol  Diet:  Dysphagia III Nectar thick liquids Activity:  Bedrest DVT Prophylaxis:  SCDs  CLINICALLY SIGNIFICANT STUDIES Basic Metabolic Panel:   Recent Labs Lab 04/04/14 0430 04/04/14 1600 04/05/14 0500 04/06/14 0510  NA  --  135* 135* 139  K  --  4.4 4.3 4.0  CL  --  101 101 105  CO2  --  25 26 25   GLUCOSE  --  91 100* 89  BUN  --  8 7 11   CREATININE  --  1.94* 1.93* 2.87*  CALCIUM  --  7.5* 7.6* 7.7*  MG 2.3  --  2.4  --   PHOS  --  2.3 2.3  --    Liver Function Tests:   Recent Labs Lab 04/04/14 1600 04/05/14 0500  ALBUMIN 2.1* 2.1*   CBC:   Recent Labs Lab 04/03/14 0300  04/05/14 0500 04/06/14 0510  WBC 6.0  < > 5.1 8.6  NEUTROABS 4.1  --  3.3  --   HGB 8.1*  < > 7.4* 7.6*  HCT 25.4*  < > 23.6* 25.0*  MCV 92.7  < > 92.5 95.1  PLT 172  < > 157 206  < > = values in this interval not displayed. Coagulation:  No results found for this  basename: LABPROT, INR,  in the last 168 hours Cardiac Enzymes:   Recent Labs Lab 03/30/14 1555 03/30/14 2330 03/31/14 0430 04/02/14 0310  CKTOTAL  --   --  75 176  CKMB  --   --  2.1 3.5  TROPONINI 0.51* 0.80* 0.71*  --    Urinalysis:  No results found for this basename: COLORURINE, APPERANCEUR, LABSPEC, PHURINE, GLUCOSEU, HGBUR, BILIRUBINUR, KETONESUR, PROTEINUR, UROBILINOGEN, NITRITE, LEUKOCYTESUR,  in the last 168 hours Lipid Panel    Component Value Date/Time   CHOL 187 01/22/2012 0515   TRIG 236* 04/02/2014 0309   HDL 38* 01/22/2012 0515   CHOLHDL 4.9 01/22/2012 0515   VLDL 25 01/22/2012 0515   LDLCALC 124* 01/22/2012 0515   HgbA1C  Lab Results  Component Value Date   HGBA1C 5.3 03/08/2014    Urine Drug Screen:     Component Value Date/Time   LABOPIA NONE DETECTED 03/29/2014 2251   LABOPIA NEGATIVE 03/08/2014 2108   COCAINSCRNUR NONE DETECTED 03/29/2014 2251   COCAINSCRNUR NEGATIVE 03/08/2014 2108   LABBENZ NONE DETECTED 03/29/2014 2251   LABBENZ NEGATIVE 03/08/2014 2108   AMPHETMU NONE  DETECTED 03/29/2014 2251   AMPHETMU NEGATIVE 03/08/2014 2108   THCU NONE DETECTED 03/29/2014 2251   LABBARB NONE DETECTED 03/29/2014 2251    Alcohol Level: No results found for this basename: ETH,  in the last 168 hours  Ct Head Wo Contrast 03/29/2014    Significantly limited exam by patient motion artifact.  A focus of hemorrhage is noted as described above at the junction of the left temporal and parietal lobes. Mild surrounding edema is noted.      Dg Chest Port 1 View 03/30/2014 Endotracheal tube as described.  Increasing bilateral infiltrative changes.   Electronically Signed   By: Alcide CleverMark  Lukens M.D.   On: 03/30/2014 00:21   Dg Chest Port 1 View 03/29/2014    Cardiac enlargement.  No evidence of active pulmonary disease.    CT Angio of head 03/30/2014 1. No intracranial aneurysm identified. 2. Mild anterior and posterior circulation branches vessel irregularity, suggestive of mild intracranial atherosclerosis. No evidence of significant proximal stenosis. 3. Unchanged, acute left temporal lobe parenchymal hemorrhage.  EEG Abnormalities:  1) Triphasic waves  2) generalized slow activity  Clinical Interpretation: This EEG is consistent with a generalized nonspecific cerebral dysfunction. Triphasic waves, nonspecific, can be associated with metabolic encephalopathy such as renal failure.    2D Echocardiogram  Left ventricle: The cavity size was normal. Wall thickness was increased in a pattern of severe LVH.  Systolic function was severely reduced. The estimated ejection fraction was in the range of 25% to 30%.  Severe hypokinesis of the entire myocardium.   Carotid Doppler  pending  CXR   IMPRESSION:  1. Stable positioning of support apparatus. No pneumothorax.  2. Marked improved aeration of the lungs suggests resolving edema  and atelectasis.  EKG  Normal sinus rhythm rate 96 beats per minute.  For complete results please see formal report.   Therapy Recommendations  pending  Physical Exam  General: The patient is sedated, unable to cooperate significantly at the time of the examination.  Skin: No significant peripheral edema is noted.   Neurologic Exam  Mental status: the patient is awake, minimally verbal, globally aphasic not able to follow commands.  Cranial nerves: Facial symmetry is present. Speech is whispery, difficult to understand. He will blink to threat bilaterally, will not track objects well.  Motor: The patient has symmetric motor tone on  all 4's, the patient is not following commands for full motor testing, no obvious hemiparesis. No tremors noted.  Sensory examination: Pain stimulation appears to result in a symmetric response on the arms and legs.  Coordination: The patient is unable to follow commands for cerebellar testing.  Gait and station: The gait could not be tested.  Reflexes: Deep tendon reflexes are symmetric, but are hyper.   ASSESSMENT Matthew Beard is a 38 y.o. male presenting with a witnessed seizure and collapse. TPA was not initiated secondary to hemorrhage. Head CT - a focus of hemorrhage is noted at the junction of the left temporal and parietal lobes.On no atherothrombotics prior to admission. Now on no atherothrombotics for secondary stroke prevention due to hemorrhagic stroke. Patient with resultant unresponsiveness. Stroke work up underway.   Hypertension  History of noncompliance with medications  ESRD - HD  Nonischemic cardiomyopathy. Ejection fraction 25-30%.  History of lacunar infarcts  Anemia   Hospital day # 8  The patient is not responding well, some tremors seen, may have a concern for subclinical seizures. At baseline, the patient is cognitively slow.  TREATMENT/PLAN  No antithrombotics at this time for secondary stroke prevention due to hemorrhagic stroke.  Await carotid Dopplers  Keep systolic blood pressure less than 160  On dilantin and keppra, free dilantin level  pending      04/06/2014, 12:14 PM  Elspeth Cho, DO [facility] Pager: [phone]       To contact Stroke Continuity provider, please refer to [email or web address]. After hours, contact General Neurology

## 2014-04-06 NOTE — Procedures (Signed)
Patient was seen on dialysis and the procedure was supervised.  BFR 400  Via AVF BP is  175/116.   Patient appears to be tolerating treatment well- needs much vol off  Matthew Beard 04/06/2014

## 2014-04-07 DIAGNOSIS — R569 Unspecified convulsions: Secondary | ICD-10-CM

## 2014-04-07 DIAGNOSIS — R1310 Dysphagia, unspecified: Secondary | ICD-10-CM | POA: Diagnosis present

## 2014-04-07 DIAGNOSIS — J690 Pneumonitis due to inhalation of food and vomit: Secondary | ICD-10-CM | POA: Diagnosis present

## 2014-04-07 DIAGNOSIS — G9340 Encephalopathy, unspecified: Secondary | ICD-10-CM | POA: Diagnosis present

## 2014-04-07 LAB — GLUCOSE, CAPILLARY: Glucose-Capillary: 92 mg/dL (ref 70–99)

## 2014-04-07 MED ORDER — CLONIDINE HCL 0.3 MG PO TABS
0.3000 mg | ORAL_TABLET | Freq: Three times a day (TID) | ORAL | Status: DC
  Administered 2014-04-07 – 2014-04-21 (×36): 0.3 mg via ORAL
  Filled 2014-04-07 (×44): qty 1

## 2014-04-07 MED ORDER — CHLORPROMAZINE HCL 10 MG PO TABS
10.0000 mg | ORAL_TABLET | Freq: Three times a day (TID) | ORAL | Status: DC | PRN
  Administered 2014-04-07: 10 mg via ORAL
  Filled 2014-04-07: qty 1

## 2014-04-07 MED ORDER — DARBEPOETIN ALFA-POLYSORBATE 200 MCG/0.4ML IJ SOLN
200.0000 ug | INTRAMUSCULAR | Status: DC
  Administered 2014-04-08: 200 ug via INTRAVENOUS
  Filled 2014-04-07 (×2): qty 0.4

## 2014-04-07 NOTE — Progress Notes (Signed)
Palliative Medicine Team Progress Note  Patient is improving and goals are to continue full scope medical treatment including long term conventional HD. He is able to communicate his preference for continued aggressive medical support. His goals of care will be ongoing but for now he is making progress towards a rehabilitation goal. He has no current symptom management needs. PMT will sign off for now. Please re-consult if his goals change or there is additional need for palliative support.  Anderson Malta, DO Palliative Medicine

## 2014-04-07 NOTE — Progress Notes (Signed)
Stroke Team Progress Note  HISTORY Matthew Beard is a 38 y.o. 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  Patient was home with his children when suddenly collapsed to the ground and had a generalized convulsion. EMS called and patient brought to the ED where He was confused with SBP 249.  Urgent CT brain revealed a focus of parenchymal hemorrhage at the junction of the left temporal and left parietal lobes which measures 1.6 x 1.6 cm.Mild surrounding edema but no mass effect or IV extension.  PTT 37. INR 1.30. Platelets 198  Patient was not protecting his airway and was intubated in the ED.  Loaded with 1 gram IV keppra. Nicardipine infusion started.   Date last known well: 03/29/14  Time last known well: unclear  tPA Given: no, ICH   SUBJECTIVE No family at bedside.   OBJECTIVE Most recent Vital Signs: Filed Vitals:   04/06/14 1652 04/06/14 2023 04/07/14 0500 04/07/14 0900  BP: 164/94 175/117 195/113 174/90  Pulse: 84 85 92 90  Temp: 97.3 F (36.3 C) 98.3 F (36.8 C) 97.2 F (36.2 C) 97.5 F (36.4 C)  TempSrc: Axillary Oral Oral Oral  Resp: 18 16 20 20   Height:      Weight:  78.3 kg (172 lb 9.9 oz)    SpO2: 96% 93% 94% 95%   CBG (last 3)   Recent Labs  04/06/14 0411 04/06/14 1210 04/07/14 1128  GLUCAP 90 86 92    IV Fluid Intake:      MEDICATIONS  . amLODipine  10 mg Oral Daily  . carvedilol  25 mg Oral BID WC  . cloNIDine  0.3 mg Oral TID  . darbepoetin (ARANESP) injection - DIALYSIS  200 mcg Intravenous Q Wed-HD  . feeding supplement (ENSURE)  1 Container Oral BID  . hydrALAZINE  100 mg Oral 3 times per day  . levETIRAcetam  500 mg Intravenous Q12H  . phenytoin  100 mg Oral TID   PRN:  sodium chloride, chlorproMAZINE, diphenhydrAMINE, fentaNYL,  hydrALAZINE, labetalol  Diet:  Dysphagia III Nectar thick liquids Activity:  Bedrest DVT Prophylaxis:  SCDs  CLINICALLY SIGNIFICANT STUDIES Basic Metabolic Panel:   Recent Labs Lab 04/04/14 0430 04/04/14 1600 04/05/14 0500 04/06/14 0510  NA  --  135* 135* 139  K  --  4.4 4.3 4.0  CL  --  101 101 105  CO2  --  25 26 25   GLUCOSE  --  91 100* 89  BUN  --  8 7 11   CREATININE  --  1.94* 1.93* 2.87*  CALCIUM  --  7.5* 7.6* 7.7*  MG 2.3  --  2.4  --   PHOS  --  2.3 2.3  --    Liver Function Tests:   Recent Labs Lab 04/04/14 1600 04/05/14 0500  ALBUMIN 2.1* 2.1*   CBC:   Recent Labs Lab 04/03/14 0300  04/05/14 0500 04/06/14 0510  WBC 6.0  < > 5.1 8.6  NEUTROABS 4.1  --  3.3  --   HGB 8.1*  < > 7.4* 7.6*  HCT 25.4*  < > 23.6* 25.0*  MCV 92.7  < > 92.5 95.1  PLT 172  < > 157 206  < > = values in this interval not displayed. Coagulation:  No results found for this basename: LABPROT,  INR,  in the last 168 hours Cardiac Enzymes:   Recent Labs Lab 04/02/14 0310  CKTOTAL 176  CKMB 3.5   Urinalysis:  No results found for this basename: COLORURINE, APPERANCEUR, LABSPEC, PHURINE, GLUCOSEU, HGBUR, BILIRUBINUR, KETONESUR, PROTEINUR, UROBILINOGEN, NITRITE, LEUKOCYTESUR,  in the last 168 hours Lipid Panel    Component Value Date/Time   CHOL 187 01/22/2012 0515   TRIG 236* 04/02/2014 0309   HDL 38* 01/22/2012 0515   CHOLHDL 4.9 01/22/2012 0515   VLDL 25 01/22/2012 0515   LDLCALC 124* 01/22/2012 0515   HgbA1C  Lab Results  Component Value Date   HGBA1C 5.3 03/08/2014    Urine Drug Screen:     Component Value Date/Time   LABOPIA NONE DETECTED 03/29/2014 2251   LABOPIA NEGATIVE 03/08/2014 2108   COCAINSCRNUR NONE DETECTED 03/29/2014 2251   COCAINSCRNUR NEGATIVE 03/08/2014 2108   LABBENZ NONE DETECTED 03/29/2014 2251   LABBENZ NEGATIVE 03/08/2014 2108   AMPHETMU NONE DETECTED 03/29/2014 2251   AMPHETMU NEGATIVE 03/08/2014 2108   THCU NONE DETECTED 03/29/2014 2251   LABBARB NONE  DETECTED 03/29/2014 2251    Alcohol Level: No results found for this basename: ETH,  in the last 168 hours  Ct Head Wo Contrast 03/29/2014    Significantly limited exam by patient motion artifact.  A focus of hemorrhage is noted as described above at the junction of the left temporal and parietal lobes. Mild surrounding edema is noted.      Dg Chest Port 1 View 03/30/2014 Endotracheal tube as described.  Increasing bilateral infiltrative changes.   Electronically Signed   By: Alcide CleverMark  Lukens M.D.   On: 03/30/2014 00:21   Dg Chest Port 1 View 03/29/2014    Cardiac enlargement.  No evidence of active pulmonary disease.    CT Angio of head 03/30/2014 1. No intracranial aneurysm identified. 2. Mild anterior and posterior circulation branches vessel irregularity, suggestive of mild intracranial atherosclerosis. No evidence of significant proximal stenosis. 3. Unchanged, acute left temporal lobe parenchymal hemorrhage.  EEG Abnormalities:  1) Triphasic waves  2) generalized slow activity  Clinical Interpretation: This EEG is consistent with a generalized nonspecific cerebral dysfunction. Triphasic waves, nonspecific, can be associated with metabolic encephalopathy such as renal failure.    2D Echocardiogram  Left ventricle: The cavity size was normal. Wall thickness was increased in a pattern of severe LVH.  Systolic function was severely reduced. The estimated ejection fraction was in the range of 25% to 30%.  Severe hypokinesis of the entire myocardium.  CXR   IMPRESSION:  1. Stable positioning of support apparatus. No pneumothorax.  2. Marked improved aeration of the lungs suggests resolving edema  and atelectasis.  EKG  Normal sinus rhythm rate 96 beats per minute.  For complete results please see formal report.   Therapy Recommendations   Physical Exam General: The patient is sedated, unable to cooperate significantly at the time of the examination. Skin: No significant  peripheral edema is noted.  Neurologic Exam Mental status: the patient is awake, minimally verbal, globally aphasic not able to follow commands. Cranial nerves: Facial symmetry is present. Speech is whispery, difficult to understand. He will blink to threat bilaterally, will not track objects well. Motor: The patient has symmetric motor tone on all 4's, the patient is not following commands for full motor testing, no obvious hemiparesis. No tremors noted. Sensory examination: Pain stimulation appears to result in a symmetric response on the arms and legs. Coordination: The patient is unable to follow commands  for cerebellar testing. Gait and station: The gait could not be tested. Reflexes: Deep tendon reflexes are symmetric, but are hyper.   ASSESSMENT Matthew Beard is a 38 y.o. male presenting with a witnessed seizure and collapse. TPA was not initiated secondary to hemorrhage. Head CT - a focus of hemorrhage is noted at the junction of the left temporal and parietal lobes.On no atherothrombotics prior to admission. Now on no atherothrombotics for secondary stroke prevention due to hemorrhage.   Hypertension  History of noncompliance with medications  ESRD - HD  Nonischemic cardiomyopathy. Ejection fraction 25-30%.  History of lacunar infarcts  Anemia  Hospital day # 9  TREATMENT/PLAN  systolic blood pressure less than 180  Continue dilantin and keppra  Continue aggressive medical support at patient's request  Nothing further to and from the stroke service  Stroke service will sign off  Please call for any needs  Followup with you for neurology in 2 months  SHARON BIBY, MSN, RN, ANVP-BC, ANP-BC, [facility] Pager: [phone] 04/07/2014 3:07 PM  I have personally obtained a history, examined the patient, evaluated imaging results, and formulated the assessment and plan of care. I agree with the above. Delia Heady, MD      To contact  Stroke Continuity provider, please refer to [email or web address]. After hours, contact General Neurology

## 2014-04-07 NOTE — Progress Notes (Signed)
  [facility] Progress Note   Subjective: Alert, no complaints  Filed Vitals:   04/06/14 2023 04/07/14 0500 04/07/14 0900 04/07/14 1324  BP: 175/117 195/113 174/90 167/100  Pulse: 85 92 90   Temp: 98.3 F (36.8 C) 97.2 F (36.2 C) 97.5 F (36.4 C)   TempSrc: Oral Oral Oral   Resp: 16 20 20    Height:      Weight: 78.3 kg (172 lb 9.9 oz)     SpO2: 93% 94% 95%    Exam: No distress, drowsy, arousable No jvd Chest clear bilat RRR no MRG Abd soft, NTND, no ascites 1+ LE edema L AVF patent Neuro moves all ext equally, Ox 2  HD: MWF [facility] 4h  79kg  2/2.25 [place]  300/800   Heparin none due to bleed (was 8000)  LUA AVF Hectorol 4  Aranesp 100 Q Wed   Venofer 100/hd thru 5/20 Last tsat 12%     Assessment: 1 Acute CVA - left temporal lobe, hemorrhagic 2 NICM EF 25-30% 3 ESRD on HD 4 Anemia Hb 7's on aranesp 200/wk 5 HPTH renagel on hold, Ca/phos stable, no meds 6 HTN/ vol excess- cont 4 BP meds, cont to lower vol w HD  Plan- HD tomorrow, lower volume, check fe/tibc, transfuse prn    Vinson Moselle MD  pager [phone]    cell [phone]  04/07/2014, 1:51 PM     Recent Labs Lab 04/04/14 0428 04/04/14 1600 04/05/14 0500 04/06/14 0510  NA 137 135* 135* 139  K 4.2 4.4 4.3 4.0  CL 103 101 101 105  CO2 25 25 26 25   GLUCOSE 101* 91 100* 89  BUN 9 8 7 11   CREATININE 2.08* 1.94* 1.93* 2.87*  CALCIUM 7.4* 7.5* 7.6* 7.7*  PHOS 2.3 2.3 2.3  --     Recent Labs Lab 04/04/14 0428 04/04/14 1600 04/05/14 0500  ALBUMIN 2.1* 2.1* 2.1*    Recent Labs Lab 04/02/14 0309 04/03/14 0300 04/04/14 0430 04/05/14 0500 04/06/14 0510  WBC 8.1 6.0 4.8 5.1 8.6  NEUTROABS 5.8 4.1  --  3.3  --   HGB 8.0* 8.1* 7.4* 7.4* 7.6*  HCT 25.1* 25.4* 23.4* 23.6* 25.0*  MCV 90.6 92.7 92.1 92.5 95.1  PLT 176 172 151 157 206   . amLODipine  10 mg Oral Daily  . carvedilol  25 mg Oral BID WC  . cloNIDine  0.3 mg Oral TID  . [START ON 04/08/2014] darbepoetin (ARANESP) injection  - DIALYSIS  200 mcg Intravenous Q Thu-HD  . feeding supplement (ENSURE)  1 Container Oral BID  . hydrALAZINE  100 mg Oral 3 times per day  . levETIRAcetam  500 mg Intravenous Q12H  . phenytoin  100 mg Oral TID     sodium chloride, chlorproMAZINE, diphenhydrAMINE, fentaNYL, hydrALAZINE, labetalol

## 2014-04-07 NOTE — Progress Notes (Signed)
Speech Language Pathology Treatment: Dysphagia;Cognitive-Linquistic  Patient Details Name: Matthew Beard MRN: [id number] DOB: 11-May-1976 Today's Date: 04/07/2014 Time: 1191-4782 SLP Time Calculation (min): 12 min  Assessment / Plan / Recommendation Clinical Impression  Treatment focused on dysphagia, aphasia and cognition.  RN reported little to no intake at lunch due to lethargy.  Pt. required tactile/verbal total stimulation to arouse; poor eye contact and decreased acknowledgement of SLP.  Oral care removed mild amount of Ensure pudding.  No solids/nectar liquids given due to development and continuous hiccuping increasing potential for penetration/aspiration.  Modalities to elicit verbal/gestural responses/command following and attention included basic/bigraphical yes/no questions and contextual one step commands, however no volitional response.  Audible vocalization such as a sigh.  RN encouraged oral care following meds/meals and eat only when alert and engaged/aware.  ST will continue communicative and dysphagia intervention.   HPI HPI: Matthew Beard is a 38 y.o. 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  Intubated on admission 5/11. Pt was extubated on 04/02/14. CT brain revealed a focus of parenchymal hemorrhage at the junction of the left temporal and left parietal lobes.  BSE recommended dys 3/Nectar thick liquids. ST in for follow up to determine readiness to advance diet.   Pertinent Vitals WDL  SLP Plan  Continue with current plan of care    Recommendations Diet recommendations: Dysphagia 3 (mechanical soft);Nectar-thick liquid Liquids provided via: Cup;No straw Medication Administration: Crushed with puree Supervision: Full supervision/cueing for compensatory  strategies;Patient able to self feed Compensations: Slow rate;Small sips/bites;Check for pocketing Postural Changes and/or Swallow Maneuvers: Seated upright 90 degrees              Oral Care Recommendations: Oral care BID Follow up Recommendations: Inpatient Rehab Plan: Continue with current plan of care    GO     Royce Macadamia M.Ed [employer] [phone]  04/07/2014

## 2014-04-07 NOTE — Progress Notes (Addendum)
PULMONARY / CRITICAL CARE MEDICINE   Name: Matthew Beard MRN: [id number] DOB: 09/29/1976   PCP [name],[name], MD   ADMISSION DATE:  03/29/2014 CONSULTATION DATE:  03/30/14  REFERRING MD :  EDP PRIMARY SERVICE: PCCM  CHIEF COMPLAINT:  Cerebral hemorrhage  BRIEF PATIENT DESCRIPTION:  [name] is a 38 y.o. M with PMH significant for ESRD (non-compliant with dialysis), HTN, anemia, NICM, CHF, SOB, who presented to ED on 5/11 after he was at home and per ED notes, fell backwards and began to shake.  His fiances children witnessed the event and called her.  He was in his USOH prior to this episode although fiance notes that his health has not been very good of late.  In ED, initial BP was 209/142 and got as high as 249/144.  Head CT was obtained and revealed a small focal hemorrhage at junction of left parietal and temporal lobes. Pt was intubated due to agitation/attempting to pull at lines/confusion/concern for lack of ability to protect airway.Per pt's fiance, he is non-compliant with his home meds as well as dialysis and she believes that he was supposed to have dialysis today, however, missed it. Of note, [facility] after he was admitted and treated for volume overload and found to have ESRD.   SIGNIFICANT EVENTS / STUDIES:  5/11 - fell backwards at home and began shaking, questionable loss of consciousness. 5/12 Head CT >>> small hemorrhage at junction of left temporal and parietal lobes.  Intubated by EDP for airway protection. 03/30/14: Dr Pearlean BrownieSethi recommends SBP 120-160 as goal. Patient very hypertensive and agitated. CT angio head shows no IC aneurysm but has acute left temporal lobe hge. : Remains hypertensive on propofol and cardene.  Intubated by EDP. 03/31/14: MRI with 1.8cm acute left temporal lobe bleed with multiple micro infarcts and bleed. On diprivan: RASS -3 but doing sbt.  Off cardene. On serveral home bp meds - sbp 111 . ECHO shows sudden drop in  EF from 50% in march 2015 tor 25% now. Global hypokinesis 04/01/14: Cards reckons hypertensive "blown out" cardiomyopathy. On CVVH ongoing. Off sedation: not awake enough for extubation though doing SBT 04/02/14: Palliative care meeting: family understands poor long term prognosis. 617 year old son struggling will patient illness but understands gravity of situation. Full code for now including reintubation. Patient agitated but not following commands but good cough and gag. Ok from neuro stand point to extubate. Having hypoglycemia 04/03/14: Extubated 24h ago. Neur concerned for sub clinical seizures, repeat EEG in progress. Agitated intermittently with perioids of following commands. On d10 with sugars now 95 5/18 CRRT stopped 5/19 Conventional HD tolerated per AVF 5/19 transferred to Renal floor. TRH to assume care as of AM 5/20  LINES / TUBES: ETT 5/12 >>>5/15 Rt IJ Trialysis cath 03/30/14 >> 5/19  CULTURES: Sputum 5/11 >> NOF Urine 5/11 >> NEG Blood 5/11 >> NEG   ANTIBIOTICS: Vanc 5/11 >>5/11 Maxipime 5/11 >>>5/12 Imipenem  5/12 >>5/13 Levaquin 5/13 >> 5/18   SUBJECTIVE/OVERNIGHT/INTERVAL HX No distress. No complaints. Labile hypertension  VITAL SIGNS: Temp:  [97.3 F (36.3 C)-98.5 F (36.9 C)] 98.3 F (36.8 C) (05/19 2023) Pulse Rate:  [84-101] 85 (05/19 2023) Resp:  [12-29] 16 (05/19 2023) BP: (137-183)/(93-122) 175/117 mmHg (05/19 2023) SpO2:  [93 %-100 %] 93 % (05/19 2023) Weight:  [77.5 kg (170 lb 13.7 oz)-82.1 kg (181 lb)] 78.3 kg (172 lb 9.9 oz) (05/19 2023) HEMODYNAMICS:   VENTILATOR SETTINGS:  INTAKE / OUTPUT: Intake/Output     05/19 0701 - 05/20 0700   P.O. 236   Total Intake(mL/kg) 236 (3)   Urine (mL/kg/hr) 150 (0.1)   Other 3474 (1.8)   Stool 1 (0)   Total Output 3625   Net -3389         PHYSICAL EXAMINATION: General: NAD Neuro: Poorly oriented, MAEs HEENT: WNL Cardiovascular: reg, no M Lungs: Clear Abdomen: soft, +BS Ext: warm, symmetric LE  edema    LABS: I have reviewed all of today's lab results. Relevant abnormalities are discussed in the A/P section  CXR: mild edema pattern  ASSESSMENT / PLAN:  NEUROLOGIC A:  ICH, likely hypertensive Questionable seizure. P:   Eval and mgmt per Neuro  CARDIOVASCULAR A:  Hypertensive Emergency NICM, LVEF 25% - likely hypertensive in origin Severe labile hypertension P:  Resume home anti-hypertensives  PULMONARY A: VDRF due to AMS, resolved P:   Cont supp O2 as needed  RENAL A:  ESRD P:   Mgmt per Renal Service  GASTROINTESTINAL A:   Dysphagia after CVA P:   SUP: N/I post extubation D3 diet  HEMATOLOGIC A:   Anemia of renal disease No overt blood loss P:  Monitor BMET intermittently Monitor I/Os Correct electrolytes as indicated  INFECTIOUS A:   Suspected aspiration - treated No overt infection presently P:   Micro and abx as above  ENDOCRINE A:   Hypoglycemia, resolved P:   Cont diet DC SSI Monitor CBGs  Transfer to renal floor. TRH to assume care as of AM 5/20 and PCCM to sign off. Discussed with Dr Chanda Busing, MD ; [facility] [phone].  After 5:30 PM or weekends, call [phone]  04/07/2014 12:12 AM

## 2014-04-07 NOTE — Consult Note (Signed)
Physical Medicine and Rehabilitation Consult Reason for Consult: ICH Referring Physician: [facility]   HPI: Matthew Beard is a 38 y.o. right-handed male with history of end-stage renal disease with new start hemodialysis, nonischemic cardiomyopathy, hypertension, diastolic congestive heart failure history of chronic lacunar infarcts as well as medical noncompliance. Admitted 03/30/2014 after questionable syncopal episode with fall versus seizure. She did require intubation in the ED for agitation. She was loaded with Keppra as well as the addition of Dilantin. Cranial CT scan showed focus of parenchymal hemorrhage at the junction of the left temporal and left parietal lobe measuring 1.6 x 1.6 cm. CT angiogram head with no intracranial aneurysm identified no evidence of significant proximal stenosis. EEG consistent with generalized nonspecific cerebral dysfunction. She was extubated and 04/02/2014. Renal service followup hemodialysis as directed with latest creatinine . Followup MRI 03/31/2014 with noted widespread micro-bleeds, chronic corpus callosum hemorrhagic infarct as well as multiple subcentimeter acute infarcts throughout the supratentorial region. Presently maintained on mechanical soft nectar thick liquids. Await the final therapy evaluations. M.D. has requested physical medicine rehabilitation consult   Review of Systems  Unable to perform ROS: mental acuity   Past Medical History  Diagnosis Date  . Hypertension   . CKD (chronic kidney disease), stage IV   . Normocytic anemia   . Pericardial effusion     Remote hx  . Cerebrovascular disease     Chronic lacunar infarcts  . NICM (nonischemic cardiomyopathy)   . CHF (congestive heart failure)   . Shortness of breath    Past Surgical History  Procedure Laterality Date  . Av fistula placement Left 10/19/2013    Procedure: ARTERIOVENOUS (AV) FISTULA CREATION- LEFT RADIOCEPHALIC;  Surgeon: Larina Earthlyodd F Early, MD;  [facility];   Service: Vascular;  Laterality: Left;   Family History  Problem Relation Age of Onset  . Hypertension Mother   . Stroke Mother   . Heart disease Mother     Heart Disease before age 39  . Hypertension Father   . Stroke Father   . Heart disease Father     Heart Disease before age 39  . Hypertension Brother    Social History:  reports that he has never smoked. He has never used smokeless tobacco. He reports that he does not drink alcohol or use illicit drugs. Allergies:  Allergies  Allergen Reactions  . Amoxicillin Hives   Medications Prior to Admission  Medication Sig Dispense Refill  . amLODipine (NORVASC) 10 MG tablet Take 1 tablet (10 mg total) by mouth daily.  30 tablet  0  . carvedilol (COREG) 25 MG tablet Take 1 tablet (25 mg total) by mouth 2 (two) times daily with a meal.  60 tablet  0  . cloNIDine (CATAPRES) 0.2 MG tablet Take 1 tablet (0.2 mg total) by mouth 3 (three) times daily.  90 tablet  0  . furosemide (LASIX) 80 MG tablet Take 80 mg by mouth 2 (two) times daily.      . hydrALAZINE (APRESOLINE) 100 MG tablet Take 1 tablet (100 mg total) by mouth 3 (three) times daily.  90 tablet  0  . isosorbide mononitrate (IMDUR) 30 MG 24 hr tablet Take 1 tablet (30 mg total) by mouth daily.  30 tablet  0  . metolazone (ZAROXOLYN) 5 MG tablet Take 5 mg by mouth daily.      . potassium chloride (K-DUR) 10 MEQ tablet Take 10 mEq by mouth daily.      .Marland Kitchen  sevelamer carbonate (RENVELA) 800 MG tablet Take 2 tablets (1,600 mg total) by mouth 3 (three) times daily with meals.  90 tablet  0    Home:    Functional History:   Functional Status:  Mobility:          ADL:    Cognition: Cognition Overall Cognitive Status: Impaired/Different from baseline Arousal/Alertness: Lethargic Orientation Level: Oriented to person;Oriented to place Attention: Focused Focused Attention: Impaired Focused Attention Impairment: Verbal basic;Functional basic Memory: Impaired Memory Impairment:  Storage deficit;Decreased recall of new information Awareness: Impaired Awareness Impairment: Intellectual impairment Problem Solving: Impaired Problem Solving Impairment: Verbal basic;Functional basic Safety/Judgment: Impaired Cognition Overall Cognitive Status: Impaired/Different from baseline  Blood pressure 174/90, pulse 90, temperature 97.5 F (36.4 C), temperature source Oral, resp. rate 20, height 5\' 10"  (1.778 m), weight 78.3 kg (172 lb 9.9 oz), SpO2 95.00%. Physical Exam  Constitutional: He appears well-developed.  HENT:  Head: Normocephalic.  Eyes:  Pupils round reactive to light without nystagmus  Neck: Normal range of motion. Neck supple. No thyromegaly present.  Cardiovascular: Normal rate and regular rhythm.   Respiratory: Effort normal and breath sounds normal. No respiratory distress.  GI: Soft. Bowel sounds are normal. He exhibits no distension.  Neurological:  Patient is lethargic but arousable to verbal stimuli. He was appropriate for simple yes no questions but would not initiate conversation. Very inconsistent in follow commands, delayed processing. Moves all 4's with significant delay.   Skin: Skin is warm and dry.  Psychiatric:  Flat, poor initiation    Results for orders placed during the hospital encounter of 03/29/14 (from the past 24 hour(s))  GLUCOSE, CAPILLARY     Status: None   Collection Time    04/06/14 12:10 PM      Result Value Ref Range   Glucose-Capillary 86  70 - 99 mg/dL   Dg Chest Port 1 View  04/06/2014   CLINICAL DATA:  Respiratory failure  EXAM: PORTABLE CHEST - 1 VIEW  COMPARISON:  DG CHEST 1V PORT dated 04/02/2014; DG CHEST 1V PORT dated 03/31/2014; DG CHEST 1V PORT dated 03/30/2014  FINDINGS: Grossly unchanged enlarged cardiac silhouette and mediastinal contours. Interval extubation and removal of enteric tube. Stable positioning of large bore right jugular approach presumed temporary dialysis catheter with tip overlying the mid aspect of  the SVC. The pulmonary vasculature appears less distinct on present examination with cephalization of flow. There is minimal pleural parenchymal thickening about the right minor fissure. Worsening bilateral peri and infrahilar heterogeneous opacities. No pneumothorax. Unchanged bones.  IMPRESSION: 1. Interval extubation removal of enteric tube.  No pneumothorax. 2. Worsening of pulmonary edema and perihilar/bibasilar opacities, likely atelectasis.   Electronically Signed   By: Simonne Come M.D.   On: 04/06/2014 07:57    Assessment/Plan: Diagnosis: ICH 1. Does the need for close, 24 hr/day medical supervision in concert with the patient's rehab needs make it unreasonable for this patient to be served in a less intensive setting? Yes 2. Co-Morbidities requiring supervision/potential complications: ESRD, htn, ACD, CM 3. Due to bladder management, bowel management, safety, skin/wound care, disease management, medication administration and pain management, does the patient require 24 hr/day rehab nursing? Yes 4. Does the patient require coordinated care of a physician, rehab nurse, PT (1-2 hrs/day, 5 days/week), OT (1-2 hrs/day, 5 days/week) and SLP (1-2 hrs/day, 5 days/week) to address physical and functional deficits in the context of the above medical diagnosis(es)? Yes Addressing deficits in the following areas: balance, endurance, locomotion, strength, transferring, bowel/bladder  control, bathing, dressing, feeding, grooming, toileting, cognition, speech, language, swallowing and psychosocial support 5. Can the patient actively participate in an intensive therapy program of at least 3 hrs of therapy per day at least 5 days per week? Yes 6. The potential for patient to make measurable gains while on inpatient rehab is excellent 7. Anticipated functional outcomes upon discharge from inpatient rehab are supervision and min assist  with PT, supervision and min assist with OT, supervision and min assist with  SLP. 8. Estimated rehab length of stay to reach the above functional goals is: 18-24 days 9. Does the patient have adequate social supports to accommodate these discharge functional goals? Potentially 10. Anticipated D/C setting: Home 11. Anticipated post D/C treatments: HH therapy and Outpatient therapy 12. Overall Rehab/Functional Prognosis: good  RECOMMENDATIONS: This patient's condition is appropriate for continued rehabilitative care in the following setting: CIR Patient has agreed to participate in recommended program. Potentially Note that insurance prior authorization may be required for reimbursement for recommended care.  Comment: Need to follow up social supports.  Ranelle Oyster, MD, [facility] Physical Medicine & Rehabilitation     04/07/2014

## 2014-04-07 NOTE — Progress Notes (Signed)
[facility] PROGRESS NOTE  Matthew Beard ZOX:[id number] DOB: 10/18/1976 DOA: 03/29/2014 PCP: [name], MD  Interim summary:  Patient is a 38 year old African American male past medical history of end-stage renal disease with noncompliant with dialysis, hypertension, nonischemic cardiomyopathy and secondary congestive heart failure who was brought into the emergency room on 5/11 after he was witnessed at home to fall backwards and then have was reported seizure-like activity. In the emergency room he is found to have blood pressures as high as 249/144 and a head CT revealed a small focal hemorrhage at the junction of the left parietal and temporal lobes. Patient was intubated for protection of airway and as well as agitation. He had been just discharged from the hospital 2 weeks prior for volume overload after he was noncompliance with dialysis at that time.  In the ICU, neurology consulted. Recommendation was for systolic blood pressure between 120-160 which proved to be difficult requiring multiple medication drips. He was put on Keppra for seizures prevention. MRI done 5/13 noted a 1.8 acute left temporal lobe bleed with multiple microinfarcts and bleed. Patient was suspected of having aspiration pneumonia and started on antibiotics which completed its course on 5/18. Echocardiogram done noted drop in ejection fraction from 50% down to 25% as well as global hypokinesis. Cardiology was consulted and felt the patient likely had hypertensive component out cardiomyopathy. Patient was continued on CVVH by neurology.   Sedation was discontinued but patient was not awake enough for extubation. Palliative care was consulted and met with family on 5/15. Family was able to understand poor long-term prognosis. Patient was kept as full code. He was able to be extubated on 5/16. At this point, CVVH was stopped and conventional hemodialysis was started on 5/18. He tolerated this well and transferred to the  renal 4 on 5/19 and hospitalists assumed care on 5/20. He appears to be more awake and answers questions slowly, but appropriately.    Assessment/Plan: Principal Problem:   ICH (intracerebral hemorrhage): Stabilized.  Dysphagia: Seen by speech therapy. Current recommendation is for dysphasia 3 with nectar thick liquids after patient has more awake. Currently he is n.p.o.  Active Problems:   Uncontrolled hypertension: Blood pressure still quite high. Increase clonidine. Continuing Coreg plus hydralazine plus Norvasc.      Anemia in chronic kidney disease: Hemoglobin checked on 5/19 was 7.6.    Cardiomyopathy EF 25-30%- etiol undetermined- EF varies- 20% Aug 2014, 40% Nov 2014, 50% March 2015   ESRD- HD since April 2015: Continued on dialysis. Last session was 5/19.    Acute respiratory failure with hypoxia: Currently stable on room air.  Secondary to aspiration pneumonia    Noncompliance with medication regimen in past- mult adm for uncontrolled HTN   Convulsions/seizures on IV Keppra.    Encephalopathy: Slowly improving  Aspiration pneumonia: Treated    Code Status: Full code  Family Communication: Left message with wife  Disposition Plan: Skilled nursing versus inpatient rehabilitation   Consultants:  Critical care  Nephrology  Neurology  Cardiology  Palliative care  Procedures:  ETT 5/12 >>>5/15  Rt IJ Trialysis cath 03/30/14 >> 5/19  Echocardiogram done 5/12: Decreased ejection fraction of 81-19%, diastolic flattening, moderate dilatation of right and left atrium and ventricles. Severe LVH   Antibiotics:  Vanc 5/11 >>5/11   Maxipime 5/11 >>>5/12   Imipenem 5/12 >>5/13   Levaquin 5/13 >> 5/18   HPI/Subjective: Patient states he's doing okay. No complaints.  Objective: Filed Vitals:   04/07/14 0900  BP: 174/90  Pulse: 90  Temp: 97.5 F (36.4 C)  Resp: 20    Intake/Output Summary (Last 24 hours) at 04/07/14 1114 Last data filed at  04/07/14 0845  Gross per 24 hour  Intake    476 ml  Output   3825 ml  Net  -3349 ml   Filed Weights   04/06/14 0645 04/06/14 1116 04/06/14 2023  Weight: 80.9 kg (178 lb 5.6 oz) 77.5 kg (170 lb 13.7 oz) 78.3 kg (172 lb 9.9 oz)    Exam:   General:  Alert and oriented x2, no acute distress  Cardiovascular: Regular rate and rhythm, C4-U8, 2/6 systolic ejection murmur  Respiratory: Clear to auscultation bilaterally  Abdomen: Soft, nontender, nondistended, positive bowel sounds  Musculoskeletal: No clubbing or cyanosis, trace edema   Data Reviewed: Basic Metabolic Panel:  Recent Labs Lab 04/01/14 0400  04/02/14 0309  04/03/14 0300 04/03/14 1550 04/04/14 0428 04/04/14 0430 04/04/14 1600 04/05/14 0500 04/06/14 0510  NA 141  140  < > 137  < > 136* 134* 137  --  135* 135* 139  K 4.3  4.3  < > 4.1  < > 4.0 4.0 4.2  --  4.4 4.3 4.0  CL 105  104  < > 102  < > 101 99 103  --  101 101 105  CO2 21  21  < > 23  < > 26 25 25   --  25 26 25   GLUCOSE 77  76  < > 74  < > 104* 112* 101*  --  91 100* 89  BUN 46*  45*  < > 26*  < > 14 12 9   --  8 7 11   CREATININE 5.61*  5.67*  < > 3.64*  < > 2.59* 2.49* 2.08*  --  1.94* 1.93* 2.87*  CALCIUM 7.5*  7.5*  < > 7.8*  < > 8.0* 7.4* 7.4*  --  7.5* 7.6* 7.7*  MG 2.3  --  2.3  --  2.3  --   --  2.3  --  2.4  --   PHOS 5.1*  5.1*  < > 3.5  < > 2.8 2.4 2.3  --  2.3 2.3  --   < > = values in this interval not displayed. Liver Function Tests:  Recent Labs Lab 04/03/14 0300 04/03/14 1550 04/04/14 0428 04/04/14 1600 04/05/14 0500  ALBUMIN 2.2* 2.1* 2.1* 2.1* 2.1*   No results found for this basename: LIPASE, AMYLASE,  in the last 168 hours No results found for this basename: AMMONIA,  in the last 168 hours CBC:  Recent Labs Lab 04/01/14 0400 04/02/14 0309 04/03/14 0300 04/04/14 0430 04/05/14 0500 04/06/14 0510  WBC 5.3 8.1 6.0 4.8 5.1 8.6  NEUTROABS 3.8 5.8 4.1  --  3.3  --   HGB 8.9* 8.0* 8.1* 7.4* 7.4* 7.6*  HCT 26.8*  25.1* 25.4* 23.4* 23.6* 25.0*  MCV 89.0 90.6 92.7 92.1 92.5 95.1  PLT 194 176 172 151 157 206   Cardiac Enzymes:  Recent Labs Lab 04/02/14 0310  CKTOTAL 176  CKMB 3.5   BNP (last 3 results)  Recent Labs  07/04/13 0640 02/11/14 0800 03/08/14 1224  PROBNP 6732.0* >70000.0* >70000.0*   CBG:  Recent Labs Lab 04/05/14 1609 04/05/14 2016 04/06/14 0011 04/06/14 0411 04/06/14 1210  GLUCAP 124* 94 102* 90 86    Recent Results (from the past 240 hour(s))  URINE CULTURE     Status: None   Collection Time  03/29/14 10:51 PM      Result Value Ref Range Status   Specimen Description URINE, CATHETERIZED   Final   Special Requests NONE   Final   Culture  Setup Time     Final   Value: 03/29/2014 23:32     Performed at [facility] Count     Final   Value: NO GROWTH     Performed at [facility]   Culture     Final   Value: NO GROWTH     Performed at [facility]   Report Status 03/31/2014 FINAL   Final  CULTURE, BLOOD (ROUTINE X 2)     Status: None   Collection Time    03/29/14 10:55 PM      Result Value Ref Range Status   Specimen Description BLOOD ARM RIGHT   Final   Special Requests BOTTLES DRAWN AEROBIC AND ANAEROBIC 5CC EA   Final   Culture  Setup Time     Final   Value: 03/30/2014 04:10     Performed at [facility]   Culture     Final   Value: NO GROWTH 5 DAYS     Performed at [facility]   Report Status 04/05/2014 FINAL   Final  CULTURE, BLOOD (ROUTINE X 2)     Status: None   Collection Time    03/29/14 11:03 PM      Result Value Ref Range Status   Specimen Description BLOOD HAND LEFT   Final   Special Requests BOTTLES DRAWN AEROBIC ONLY 6CC   Final   Culture  Setup Time     Final   Value: 03/30/2014 04:10     Performed at [facility]   Culture     Final   Value: NO GROWTH 5 DAYS     Performed at [facility]   Report Status 04/05/2014 FINAL   Final  MRSA PCR SCREENING     Status:  None   Collection Time    03/30/14  4:20 AM      Result Value Ref Range Status   MRSA by PCR NEGATIVE  NEGATIVE Final   Comment:            The GeneXpert MRSA Assay (FDA     approved for NASAL specimens     only), is one component of a     comprehensive MRSA colonization     surveillance program. It is not     intended to diagnose MRSA     infection nor to guide or     monitor treatment for     MRSA infections.  CULTURE, RESPIRATORY (NON-EXPECTORATED)     Status: None   Collection Time    03/30/14  4:45 AM      Result Value Ref Range Status   Specimen Description TRACHEAL ASPIRATE   Final   Special Requests NONE   Final   Gram Stain     Final   Value: MODERATE WBC PRESENT,BOTH PMN AND MONONUCLEAR     NO SQUAMOUS EPITHELIAL CELLS SEEN     ABUNDANT GRAM POSITIVE COCCI     IN PAIRS     Performed at [facility]   Culture     Final   Value: Non-Pathogenic Oropharyngeal-type Flora Isolated.     Performed at [facility]   Report Status 04/01/2014 FINAL   Final     Studies: Dg Chest [facility] 1 [address]  04/06/2014   CLINICAL DATA:  Respiratory failure  EXAM: PORTABLE CHEST - 1 VIEW  COMPARISON:  DG CHEST 1V PORT dated 04/02/2014; DG CHEST 1V PORT dated 03/31/2014; DG CHEST 1V PORT dated 03/30/2014  FINDINGS: Grossly unchanged enlarged cardiac silhouette and mediastinal contours. Interval extubation and removal of enteric tube. Stable positioning of large bore right jugular approach presumed temporary dialysis catheter with tip overlying the mid aspect of the SVC. The pulmonary vasculature appears less distinct on present examination with cephalization of flow. There is minimal pleural parenchymal thickening about the right minor fissure. Worsening bilateral peri and infrahilar heterogeneous opacities. No pneumothorax. Unchanged bones.  IMPRESSION: 1. Interval extubation removal of enteric tube.  No pneumothorax. 2. Worsening of pulmonary edema and perihilar/bibasilar opacities,  likely atelectasis.   Electronically Signed   By: Sandi Mariscal M.D.   On: 04/06/2014 07:57    Scheduled Meds: . amLODipine  10 mg Oral Daily  . carvedilol  25 mg Oral BID WC  . cloNIDine  0.2 mg Oral TID  . darbepoetin (ARANESP) injection - DIALYSIS  200 mcg Intravenous Q Wed-HD  . feeding supplement (ENSURE)  1 Container Oral BID  . hydrALAZINE  100 mg Oral 3 times per day  . levETIRAcetam  500 mg Intravenous Q12H  . phenytoin  100 mg Oral TID   Continuous Infusions:   Principal Problem:   ICH (intracerebral hemorrhage) Active Problems:   Uncontrolled hypertension   Anemia in chronic kidney disease   Cardiomyopathy EF 25-30%- etiol undetermined- EF varies- 20% Aug 2014, 40% Nov 2014, 50% March 2015   ESRD- HD since April 2015   Acute respiratory failure with hypoxia   Noncompliance with medication regimen in past- mult adm for uncontrolled HTN   Convulsions/seizures   Encephalopathy    Time spent: 25 minutes    [facility] Pager [phone]. If 7PM-7AM, please contact night-coverage at [email or web address], password [facility] 04/07/2014, 11:14 AM  LOS: 9 days

## 2014-04-07 NOTE — Progress Notes (Signed)
NUTRITION FOLLOW UP  Intervention:   Continue Vanilla Ensure Pudding PO BID and prn, each supplement provides 170 kcal and 4 grams of protein. RD to continue to follow nutrition care plan.  Nutrition Dx:   Inadequate oral intake related to dysphagia as evidenced by 50% meal completion, improving.  Goal:   Patient will meet >/=90% of estimated nutrition needs, unmet.  Monitor:   TF tolerance, weight trends, labs, I/Os, vent status  Assessment:   Pt admitted with convulsions at home. Pt found to have small focal hemorrhage at junction of left parietal and temporal lobes. Pt was intubated due to agitation. Pt has ESRD on HD at home; family reports he is not compliant.  CRRT discontinued 5/18.  Patient seen by SLP again on 5/19 and noted that pt was not arousable, recommending feeding only when alert.  Palliative was following but has signed off as pt is improving and goals are to continue full scope medical treatment. Pt currently resting with eyes closed. Per chart - pt ate 100% of breakfast this morning.  CBG's: 86 - 92 Potassium and phosphorus WNL  Height: Ht Readings from Last 1 Encounters:  03/30/14 5\' 10"  (1.778 m)    Weight Status:   Wt Readings from Last 1 Encounters:  04/06/14 172 lb 9.9 oz (78.3 kg)   EDW per renal: 79 kg  Re-estimated needs:  Kcal: 2000 - 2200 Protein: at least 95 gm Fluid: 1.2 liters  Skin: intact  Diet Order: Dysphagia 3 with nectar thick liquids   Intake/Output Summary (Last 24 hours) at 04/07/14 1307 Last data filed at 04/07/14 0845  Gross per 24 hour  Intake    476 ml  Output    351 ml  Net    125 ml    Last BM: 5/19   Labs:   Recent Labs Lab 04/03/14 0300  04/04/14 0428 04/04/14 0430 04/04/14 1600 04/05/14 0500 04/06/14 0510  NA 136*  < > 137  --  135* 135* 139  K 4.0  < > 4.2  --  4.4 4.3 4.0  CL 101  < > 103  --  101 101 105  CO2 26  < > 25  --  25 26 25   BUN 14  < > 9  --  8 7 11   CREATININE 2.59*  < > 2.08*   --  1.94* 1.93* 2.87*  CALCIUM 8.0*  < > 7.4*  --  7.5* 7.6* 7.7*  MG 2.3  --   --  2.3  --  2.4  --   PHOS 2.8  < > 2.3  --  2.3 2.3  --   GLUCOSE 104*  < > 101*  --  91 100* 89  < > = values in this interval not displayed.  CBG (last 3)   Recent Labs  04/06/14 0411 04/06/14 1210 04/07/14 1128  GLUCAP 90 86 92    Scheduled Meds: . amLODipine  10 mg Oral Daily  . carvedilol  25 mg Oral BID WC  . cloNIDine  0.3 mg Oral TID  . darbepoetin (ARANESP) injection - DIALYSIS  200 mcg Intravenous Q Wed-HD  . feeding supplement (ENSURE)  1 Container Oral BID  . hydrALAZINE  100 mg Oral 3 times per day  . levETIRAcetam  500 mg Intravenous Q12H  . phenytoin  100 mg Oral TID    Continuous Infusions:    Jarold Motto MS, RD, LDN Inpatient Registered Dietitian Pager: [phone] After-hours pager: [phone]

## 2014-04-07 NOTE — Evaluation (Signed)
Physical Therapy Evaluation Patient Details Name: Matthew Beard MRN: [id number] DOB: [birth year] Today's Date: 04/07/2014   History of Present Illness  HPI: Matthew Beard is a 38 y.o. 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  Intubated on admission 5/11. Pt was extubated on 04/02/14. CT brain revealed a focus of parenchymal hemorrhage at the junction of the left temporal and left parietal lobes.  Clinical Impression   Pt admitted with above. Pt currently with functional limitations due to the deficits listed below (see PT Problem List). Pt will benefit from skilled PT to increase their independence and safety with mobility to allow discharge to the venue listed below.       Follow Up Recommendations CIR    Equipment Recommendations  Other (comment) (to be determined)    Recommendations for Other Services OT consult;Rehab consult     Precautions / Restrictions Precautions Precautions: Fall Restrictions Other Position/Activity Restrictions: Full Supervision for POs      Mobility  Bed Mobility Overal bed mobility: Needs Assistance Bed Mobility: Supine to Sit     Supine to sit: Mod assist     General bed mobility comments: able to initiate moving LEs off of the bed; Mod physical assist to elevate trunk from bed  Transfers Overall transfer level: Needs assistance Equipment used: 1 person hand held assist (and support give at gait belt) Transfers: Stand Pivot Transfers   Stand pivot transfers: Mod assist;Max assist       General transfer comment: Heavy mod assist to power up and max assist to translate center of mass over feet as pt had a heavy posterior lean initially  Ambulation/Gait                Stairs            Wheelchair Mobility     Modified Rankin (Stroke Patients Only) Modified Rankin (Stroke Patients Only) Pre-Morbid Rankin Score: Moderately severe disability Modified Rankin: Severe disability     Balance Overall balance assessment: Needs assistance Sitting-balance support: Bilateral upper extremity supported Sitting balance-Leahy Scale: Poor Sitting balance - Comments: posterior lean sitting EOB initially; Able to correct with tactile cueing   Standing balance support: Bilateral upper extremity supported;During functional activity Standing balance-Leahy Scale: Poor                               Pertinent Vitals/Pain no apparent distress    Home Living Family/patient expects to be discharged to:: Private residence Living Arrangements: Spouse/significant other Available Help at Discharge: Family;Available 24 hours/day Type of Home: House Home Access: Ramped entrance     Home Layout: One level Home Equipment: Walker - 2 wheels;Wheelchair - manual Additional Comments: Will need to verify this information    Prior Function Level of Independence: Needs assistance   Gait / Transfers Assistance Needed: Not sure how much pt was walking; Pt reports he was independent with transfers to wheelchair PTA           Hand Dominance        Extremity/Trunk Assessment   Upper Extremity Assessment: Defer to OT evaluation;Generalized weakness (with decr coordination bilaterally)           Lower Extremity Assessment: Generalized weakness; Able to perform heel slides (AAROM) and (semi)straight leg  raises bilaterally; MMT not formally assessed         Communication   Communication: Other (comment) (very short, terse answers)  Cognition Arousal/Alertness: Awake/alert Behavior During Therapy: WFL for tasks assessed/performed Overall Cognitive Status: No family/caregiver present to determine baseline cognitive functioning                      General Comments      Exercises         Assessment/Plan    PT Assessment Patient needs continued PT services  PT Diagnosis Difficulty walking;Abnormality of gait   PT Problem List Decreased strength;Decreased range of motion;Decreased activity tolerance;Decreased balance;Decreased mobility;Decreased coordination;Decreased cognition;Decreased knowledge of use of DME;Decreased safety awareness  PT Treatment Interventions DME instruction;Gait training;Functional mobility training;Therapeutic activities;Therapeutic exercise;Balance training;Neuromuscular re-education;Cognitive remediation;Patient/family education;Wheelchair mobility training   PT Goals (Current goals can be found in the Care Plan section) Acute Rehab PT Goals Patient Stated Goal: Did not state PT Goal Formulation: With patient Time For Goal Achievement: 04/21/14 Potential to Achieve Goals: Good    Frequency Min 4X/week   Barriers to discharge        Co-evaluation               End of Session Equipment Utilized During Treatment: Gait belt Activity Tolerance: Patient tolerated treatment well Patient left: in chair;with call bell/phone within reach Nurse Communication: Mobility status         Time: [id number] PT Time Calculation (min): 27 min   Charges:   PT Evaluation $Initial PT Evaluation Tier I: 1 Procedure PT Treatments $Therapeutic Activity: 8-22 mins   PT G Codes:          [facility] [place] 04/07/2014, 8:07 PM  Van ClinesHolly Nyemah Watton, PT  Acute Rehabilitation Services Pager [phone] Office [phone]

## 2014-04-08 DIAGNOSIS — I619 Nontraumatic intracerebral hemorrhage, unspecified: Secondary | ICD-10-CM

## 2014-04-08 LAB — PREPARE RBC (CROSSMATCH)

## 2014-04-08 LAB — CBC
HCT: 22.4 % — ABNORMAL LOW (ref 39.0–52.0)
Hemoglobin: 7.2 g/dL — ABNORMAL LOW (ref 13.0–17.0)
MCH: 30.8 pg (ref 26.0–34.0)
MCHC: 32.1 g/dL (ref 30.0–36.0)
MCV: 95.7 fL (ref 78.0–100.0)
PLATELETS: 125 10*3/uL — AB (ref 150–400)
RBC: 2.34 MIL/uL — ABNORMAL LOW (ref 4.22–5.81)
RDW: 18.8 % — AB (ref 11.5–15.5)
WBC: 6.4 10*3/uL (ref 4.0–10.5)

## 2014-04-08 LAB — PHENYTOIN LEVEL, FREE AND TOTAL
PHENYTOIN FREE: 0.9 mg/L — AB (ref 1.0–2.0)
Phenytoin Bound: 3.6 mg/L
Phenytoin, Total: 4.5 mg/L — ABNORMAL LOW (ref 10.0–20.0)

## 2014-04-08 LAB — HEPATITIS B SURFACE ANTIGEN: Hepatitis B Surface Ag: NEGATIVE

## 2014-04-08 MED ORDER — LIDOCAINE-PRILOCAINE 2.5-2.5 % EX CREA: 1.0000 "application " | TOPICAL_CREAM | CUTANEOUS | Status: DC | PRN

## 2014-04-08 MED ORDER — DARBEPOETIN ALFA-POLYSORBATE 200 MCG/0.4ML IJ SOLN
INTRAMUSCULAR | Status: AC
  Administered 2014-04-08: 200 ug via INTRAVENOUS
  Filled 2014-04-08: qty 0.4

## 2014-04-08 MED ORDER — NEPRO/CARBSTEADY PO LIQD: 237.0000 mL | ORAL | Status: DC | PRN

## 2014-04-08 MED ORDER — SODIUM CHLORIDE 0.9 % IV SOLN
100.0000 mL | INTRAVENOUS | Status: DC | PRN
Start: 2014-04-08 — End: 2014-04-08

## 2014-04-08 MED ORDER — HEPARIN SODIUM (PORCINE) 1000 UNIT/ML DIALYSIS
1000.0000 [IU] | INTRAMUSCULAR | Status: DC | PRN
  Filled 2014-04-08: qty 1

## 2014-04-08 MED ORDER — ISOSORBIDE MONONITRATE ER 30 MG PO TB24
30.0000 mg | ORAL_TABLET | Freq: Every day | ORAL | Status: DC
  Administered 2014-04-08 – 2014-04-21 (×14): 30 mg via ORAL
  Filled 2014-04-08 (×14): qty 1

## 2014-04-08 MED ORDER — LIDOCAINE HCL (PF) 1 % IJ SOLN: 5.0000 mL | INTRAMUSCULAR | Status: DC | PRN

## 2014-04-08 MED ORDER — PENTAFLUOROPROP-TETRAFLUOROETH EX AERO: 1.0000 "application " | INHALATION_SPRAY | CUTANEOUS | Status: DC | PRN

## 2014-04-08 MED ORDER — SODIUM CHLORIDE 0.9 % IV SOLN: 100.0000 mL | INTRAVENOUS | Status: DC | PRN

## 2014-04-08 MED ORDER — STARCH (THICKENING) PO POWD
ORAL | Status: DC | PRN
  Filled 2014-04-08: qty 227

## 2014-04-08 MED ORDER — ALTEPLASE 2 MG IJ SOLR
2.0000 mg | Freq: Once | INTRAMUSCULAR | Status: DC | PRN
  Filled 2014-04-08: qty 2

## 2014-04-08 NOTE — Progress Notes (Signed)
SLP Cancellation Note  Patient Details Name: Matthew Beard MRN: [id number] DOB: 1976-02-11   Cancelled treatment:        Pt. In HD when check on around 1345.  Will continue efforts next date.   Breck Coons Harrells.Ed [employer] [phone]  04/08/2014

## 2014-04-08 NOTE — Discharge Summary (Signed)
Physician Discharge Summary  Matthew Beard YSA:[id number] DOB: 01/07/1976 DOA: 03/29/2014  PCP: [name], MD  Admit date: 03/29/2014 Anticipated Discharge date: 04/09/2014  Time spent: 25 minutes  Recommendations for Outpatient Follow-up:  1. Patient being discharged to inpatient rehabilitation 2. Medication change: Zaroxolyn and Lasix and potassium being discontinued 3. Medication change: Clonidine being increased to 0.3 mg 3 times a day 4. new diet recommendation:Dysphagia 3 (mechanical soft);Nectar-thick liquid, No straw, Medication Administration: Crushed with puree  Supervision: Full supervision/cueing for compensatory strategies;Patient able to self feed  Compensations: Slow rate;Small sips/bites;Check for pocketing  Postural Changes and/or Swallow Maneuvers: Seated upright 90 degrees   Discharge Diagnoses:  Principal Problem:   ICH (intracerebral hemorrhage) Active Problems:   Uncontrolled hypertension   Anemia in chronic kidney disease   Cardiomyopathy EF 25-30%- etiol undetermined- EF varies- 20% Aug 2014, 40% Nov 2014, 50% March 2015   ESRD- HD since April 2015   Acute respiratory failure with hypoxia   Noncompliance with medication regimen in past- mult adm for uncontrolled HTN   Convulsions/seizures   Encephalopathy   Aspiration pneumonia   Dysphagia, unspecified(787.20)   Discharge Condition: Improved from initial presentation  Diet recommendation: Diet recommendations: Dysphagia 3 (mechanical soft);Nectar-thick liquid   Filed Weights   04/06/14 2023 04/08/14 0601 04/08/14 1220  Weight: 78.3 kg (172 lb 9.9 oz) 76.25 kg (168 lb 1.6 oz) 77.1 kg (169 lb 15.6 oz)    History of present illness:  Patient is a 38 year old African American male past medical history of end-stage renal disease with noncompliant with dialysis, hypertension, nonischemic cardiomyopathy and secondary congestive heart failure who was brought into the emergency room on 5/11 after he was  witnessed at home to fall backwards and then have was reported seizure-like activity. In the emergency room he is found to have blood pressures as high as 249/144 and a head CT revealed a small focal hemorrhage at the junction of the left parietal and temporal lobes. Patient was intubated for protection of airway and as well as agitation. He had been just discharged from the hospital 2 weeks prior for volume overload after he was noncompliance with dialysis at that time.   Hospital Course:  In the ICU, neurology consulted. Recommendation was for systolic blood pressure between 120-160 which proved to be difficult requiring multiple medication drips. He was put on Keppra for seizures prevention. MRI done 5/13 noted a 1.8 acute left temporal lobe bleed with multiple microinfarcts and bleed. Patient was suspected of having aspiration pneumonia and started on antibiotics which completed its course on 5/18. Echocardiogram done noted drop in ejection fraction from 50% down to 25% as well as global hypokinesis. Cardiology was consulted and felt the patient likely had hypertensive component out cardiomyopathy. Patient was continued on CVVH by neurology.   Sedation was discontinued but patient was not awake enough for extubation. Palliative care was consulted and met with family on 5/15. Family was able to understand poor long-term prognosis. Patient was kept as full code. He was able to be extubated on 5/16. At this point, CVVH was stopped and conventional hemodialysis was started on 5/18. He tolerated this well and transferred to the renal 4 on 5/19 and hospitalists assumed care on 5/20. He appears to be more awake and answers questions slowly, but appropriately. Patient was evaluated by physical medicine and rehabilitation who felt the patient appropriate for inpatient rehabilitation. He'll be transferred there when bed is available. Patient was evaluated by speech therapy his current diet recommendation is  for  dysphagia 3 diet with nectar thick liquids  Patient's blood pressure remained difficult to control. He's been continued on his home meds with the exception of diuretics which have been discontinued. His clonidine has been increased to 0.3 mg 3 times a day. Patient gets when necessary IV hydralazine for additional blood pressure control   Procedures:  Endotracheal tube 5/12-5/15  Right IJ trialysis catheter 5/12-5/19  Echocardiogram done 5/12: Decreased ejection fraction of 56-43%, diastolic flattening, moderate dilatation of left and right atria and ventricles. Severe left particular hypertrophy  Consultations:  Critical care  Nephrology  Neurology  Cardiology  Palliative care  Physical medicine and rehabilitation  Discharge Exam: Filed Vitals:   04/08/14 1600  BP: 172/119  Pulse: 76  Temp:   Resp: 16    General: Alert and oriented x2, no acute distress, interactive, slow to respond Cardiovascular: Regular rate and rhythm, P2-R5, 2/6 systolic ejection murmur Respiratory: Clear to auscultation bilaterally  Discharge Instructions You were cared for by a hospitalist during your hospital stay. If you have any questions about your discharge medications or the care you received while you were in the hospital after you are discharged, you can call the unit and asked to speak with the hospitalist on call if the hospitalist that took care of you is not available. Once you are discharged, your primary care physician will handle any further medical issues. Please note that NO REFILLS for any discharge medications will be authorized once you are discharged, as it is imperative that you return to your primary care physician (or establish a relationship with a primary care physician if you do not have one) for your aftercare needs so that they can reassess your need for medications and monitor your lab values.     Medication List    STOP taking these medications       cloNIDine 0.2  MG tablet  Commonly known as:  CATAPRES     furosemide 80 MG tablet  Commonly known as:  LASIX     metolazone 5 MG tablet  Commonly known as:  ZAROXOLYN     potassium chloride 10 MEQ tablet  Commonly known as:  K-DUR      TAKE these medications       amLODipine 10 MG tablet  Commonly known as:  NORVASC  Take 1 tablet (10 mg total) by mouth daily.     carvedilol 25 MG tablet  Commonly known as:  COREG  Take 1 tablet (25 mg total) by mouth 2 (two) times daily with a meal.     hydrALAZINE 100 MG tablet  Commonly known as:  APRESOLINE  Take 1 tablet (100 mg total) by mouth 3 (three) times daily.     isosorbide mononitrate 30 MG 24 hr tablet  Commonly known as:  IMDUR  Take 1 tablet (30 mg total) by mouth daily.     sevelamer carbonate 800 MG tablet  Commonly known as:  RENVELA  Take 2 tablets (1,600 mg total) by mouth 3 (three) times daily with meals.       Allergies  Allergen Reactions  . Amoxicillin Hives      The results of significant diagnostics from this hospitalization (including imaging, microbiology, ancillary and laboratory) are listed below for reference.    Significant Diagnostic Studies: Ct Angio Head W/cm &/or Wo Cm  03/30/2014   CLINICAL DATA:  Intracranial hemorrhage.  Rule out aneurysm.  EXAM: CT ANGIOGRAPHY HEAD  TECHNIQUE: Multidetector CT imaging of the head was  performed using the standard protocol during bolus administration of intravenous contrast. Multiplanar CT image reconstructions and MIPs were obtained to evaluate the vascular anatomy.  CONTRAST:  42m OMNIPAQUE IOHEXOL 350 MG/ML SOLN  COMPARISON:  Head CT 03/29/2014  FINDINGS: 2.0 x 1.6 cm acute parenchymal hemorrhage in the posterior left temporal lobe with mild surrounding edema does not appear significantly changed. There is no evidence of new intracranial hemorrhage. Patchy hypoattenuation in the pons is unchanged from 06/29/2013, compatible with chronic ischemic change. Old lacunar  infarcts are noted in the thalami. Moderate periventricular white matter chronic small vessel ischemic disease is unchanged. Remote infarct is also noted adjacent to the left frontal MRI. No abnormal enhancement is identified. Ventricles are mildly prominent for age, unchanged. There is no midline shift or extra-axial fluid collection. Orbits are unremarkable. Mild right frontal sinus mucosal thickening is noted. There is a small amount of right maxillary sinus fluid.  The visualized distal vertebral arteries are patent with the left being dominant. PICA origins are patent. AICA and SCA origins are patent. Basilar artery is patent without stenosis. There is a fetal origin of the left PCA with a hypoplastic left P1 segment. Patent right posterior communicating artery is not identified. There is mild bilateral PCA branch vessel irregularity without significant proximal stenosis.  Internal carotid arteries are patent from skull base to carotid termini. There is mild ACA and MCA branch vessel irregularity bilaterally without evidence of significant proximal stenosis. No intracranial aneurysm is identified.  Review of the MIP images confirms the above findings.  IMPRESSION: 1. No intracranial aneurysm identified. 2. Mild anterior and posterior circulation branches vessel irregularity, suggestive of mild intracranial atherosclerosis. No evidence of significant proximal stenosis. 3. Unchanged, acute left temporal lobe parenchymal hemorrhage.   Electronically Signed   By: ALogan Bores  On: 03/30/2014 11:48   Ct Head Wo Contrast  03/29/2014   CLINICAL DATA:  Confusion  EXAM: CT HEAD WITHOUT CONTRAST  TECHNIQUE: Contiguous axial images were obtained from the base of the skull through the vertex without intravenous contrast.  COMPARISON:  None.  FINDINGS: The examination is significantly limited by patient motion artifact. Mild atrophic changes and chronic white matter ischemic changes are seen. There is a focus of  parenchymal hemorrhage at the junction of the left temporal and left parietal lobes which measures 1.6 x 1.6 cm. No other focal abnormality is noted.  IMPRESSION: Significantly limited exam by patient motion artifact.  A focus of hemorrhage is noted as described above at the junction of the left temporal and parietal lobes. Mild surrounding edema is noted.  Critical Value/emergent results were called by telephone at the time of interpretation on 03/29/2014 at 11:48 PM to the emergency room physician, who verbally acknowledged these results.   Electronically Signed   By: MInez CatalinaM.D.   On: 03/29/2014 23:48   Mr Brain Wo Contrast  03/31/2014   CLINICAL DATA:  Intracranial hemorrhage. End-stage renal disease, non compliant. Blood pressure elevated on initial presentation.  EXAM: MRI HEAD WITHOUT CONTRAST  TECHNIQUE: Multiplanar, multiecho pulse sequences of the brain and surrounding structures were obtained without intravenous contrast.  COMPARISON:  CT head 03/29/2014.  CTA 03/30/2014.  FINDINGS: Multifocal subcentimeter areas of acute infarction affect the bilateral frontal, parietal, temporal, and occipital regions, as well as the right cerebellum. Their distribution, in the junction zones between the ACA/ MCA, and MCA/PCA vascular territories, suggests a watershed type event, possible arrhythmia or hypotensive episode. No features suggestive of PRES.  Re-demonstrated is a roughly spherical 18 mm acute hemorrhage located in the left temporal lobe with moderate surrounding edema consistent with an acute intracerebral hematoma. There is no midline shift or uncal herniation. No features suggestive of hemorrhagic metastasis. No abnormalities to suggest of vascular lesion such as aneurysm or AVM. There is an old chronic hemorrhage involving the genu of the corpus callosum. Too numerous to count microbleeds, seen best on gradient sequence throughout the cerebral hemispheres, brainstem, and cerebellum suggests that  this lesion represents a complication of longstanding hypertensive cerebrovascular disease. Cerebral amyloid angiopathy is not excluded, but less favored.  Premature for age cerebral and cerebellar atrophy. There is extensive T2 and FLAIR hyperintensities throughout the periventricular and subcortical white matter, also involving the brainstem, consistent with chronic microvascular ischemic change. Flow voids are maintained in the major intracranial vessels. Large remote lacunar infarcts are seen in the brainstem, thalamus, and subcortical white matter.  Partial empty sella. No tonsillar herniation. No worrisome osseous lesions. Marrow changes consistent with end-stage renal disease with anemia. Negative orbits. No acute sinus or mastoid disease.  IMPRESSION: Findings most consistent with an acute 1.8 cm left temporal lobe intracerebral hematoma as a complication of hypertensive cerebrovascular disease. Widespread microbleeds, chronic corpus callosum hemorrhagic infarction, multiple lacunes, and microvascular ischemic change throughout the white matter are also noted. Lobar hemorrhage from cerebral amyloid angiopathy is not excluded but less favored.  Multiple subcentimeter acute infarcts throughout the supratentorial region also affecting the right cerebellum; watershed type event is favored; correlate clinically with arrhythmia or hypotensive episode.   Electronically Signed   By: Rolla Flatten M.D.   On: 03/31/2014 12:29   Dg Chest Port 1 View  04/06/2014   CLINICAL DATA:  Respiratory failure  EXAM: PORTABLE CHEST - 1 VIEW  COMPARISON:  DG CHEST 1V PORT dated 04/02/2014; DG CHEST 1V PORT dated 03/31/2014; DG CHEST 1V PORT dated 03/30/2014  FINDINGS: Grossly unchanged enlarged cardiac silhouette and mediastinal contours. Interval extubation and removal of enteric tube. Stable positioning of large bore right jugular approach presumed temporary dialysis catheter with tip overlying the mid aspect of the SVC. The  pulmonary vasculature appears less distinct on present examination with cephalization of flow. There is minimal pleural parenchymal thickening about the right minor fissure. Worsening bilateral peri and infrahilar heterogeneous opacities. No pneumothorax. Unchanged bones.  IMPRESSION: 1. Interval extubation removal of enteric tube.  No pneumothorax. 2. Worsening of pulmonary edema and perihilar/bibasilar opacities, likely atelectasis.   Electronically Signed   By: Sandi Mariscal M.D.   On: 04/06/2014 07:57   Dg Chest Port 1 View  04/02/2014   CLINICAL DATA:  Endotracheal tube  EXAM: PORTABLE CHEST - 1 VIEW  COMPARISON:  DG CHEST 1V PORT dated 03/31/2014; DG CHEST 1V PORT dated 03/30/2014; DG CHEST 1V PORT dated 03/29/2014; DG CHEST 1V PORT dated 03/08/2014; DG CHEST 2 VIEW dated 02/11/2014  FINDINGS: Grossly unchanged enlarged cardiac silhouette and mediastinal contours. Stable positioning of support apparatus. No pneumothorax. Marked improved aeration of the lungs with persistent bilateral infrahilar opacities, left greater than right. Trace a sided effusion is not excluded. No definite evidence of edema. No pneumothorax. Unchanged bones.  IMPRESSION: 1.  Stable positioning of support apparatus.  No pneumothorax. 2. Marked improved aeration of the lungs suggests resolving edema and atelectasis.   Electronically Signed   By: Sandi Mariscal M.D.   On: 04/02/2014 07:52   Dg Chest Port 1 View  03/31/2014   CLINICAL DATA:  Evaluate endotracheal tube position.  EXAM: PORTABLE CHEST - 1 VIEW  COMPARISON:  DG CHEST 1V PORT dated 03/30/2014; DG CHEST 1V PORT dated 03/30/2014; DG CHEST 1V PORT dated 03/29/2014; DG CHEST 1V PORT dated 03/08/2014  FINDINGS: Cardiopericardial silhouette is enlarged. Endotracheal tube tip is 48 mm from the carina. Right IJ vas cath present, unchanged. Enteric tube is present with the tip not visualized. Diffuse bilateral basilar predominant airspace disease, most compatible with pulmonary edema. No large  pleural effusions are identified. Monitoring leads project over the chest.  IMPRESSION: 1. Support apparatus in good position, detailed above. 2. Slight worsening pulmonary aeration with lower volumes and more prominent basilar opacity, most consistent with pulmonary edema based on symmetry.   Electronically Signed   By: Dereck Ligas M.D.   On: 03/31/2014 07:57   Dg Chest Port 1 View  03/30/2014   CLINICAL DATA:  Placement of endotracheal tube, central line, and NG tube  EXAM: PORTABLE CHEST - 1 VIEW  COMPARISON:  DG CHEST 1V PORT dated 03/30/2014; DG CHEST 1V PORT dated 03/29/2014; DG CHEST 1V PORT dated 03/08/2014  FINDINGS: Endotracheal tube is identified with tip 3.5 cm above the carinal. There is a right internal jugular central line with tip projecting over the right mainstem bronchus. An NG tube is identified crossing the gastroesophageal junction, with its tip not identified.  Mild cardiac enlargement. Hazy right lower lobe and left lower lobe opacities.  IMPRESSION: 1. Stable right lower lung zone and left lower lung zone infiltrate with improved aeration in the left upper lobe when compared to prior study. 2. Lines and tubes as described. Position of right internal jugular central line tip indeterminate. It projects over an area that could be associated with the superior vena cava. Distinction between potential arterial or venous location cannot be made on this single projection radiograph. Correlate clinically.   Electronically Signed   By: Skipper Cliche M.D.   On: 03/30/2014 19:40   Dg Chest Port 1 View  03/30/2014   CLINICAL DATA:  Status post intubation  EXAM: PORTABLE CHEST - 1 VIEW  COMPARISON:  03/29/2014  FINDINGS: Cardiac shadow remains enlarged. Endotracheal tube is now noted 4.6 cm above the carina. Increasing infiltrative changes are noted bilaterally but particularly in the left mid lung when compared with the prior study. No acute bony abnormality is seen.  IMPRESSION: Endotracheal  tube as described.  Increasing bilateral infiltrative changes.   Electronically Signed   By: Inez Catalina M.D.   On: 03/30/2014 00:21   Dg Chest Port 1 View  03/29/2014   CLINICAL DATA:  Loss of consciousness.  Dialysis patient.  EXAM: PORTABLE CHEST - 1 VIEW  COMPARISON:  DG CHEST 1V PORT dated 03/08/2014  FINDINGS: Cardiac enlargement without vascular congestion or edema. No focal consolidation or airspace disease. No blunting of costophrenic angles. No pneumothorax. No significant change since prior study.  IMPRESSION: Cardiac enlargement.  No evidence of active pulmonary disease.   Electronically Signed   By: Lucienne Capers M.D.   On: 03/29/2014 22:56   Dg Abd Portable 1v  04/02/2014   CLINICAL DATA:  Feeding tube placement  EXAM: PORTABLE ABDOMEN - 1 VIEW  COMPARISON:  None.  FINDINGS: Feeding tube tip is in the stomach. Bowel gas pattern is unremarkable. No free air is seen on this supine examination.  IMPRESSION: Feeding tube tip in stomach.   Electronically Signed   By: Lowella Grip M.D.   On: 04/02/2014 17:25    Microbiology: Recent Results (from the past  240 hour(s))  URINE CULTURE     Status: None   Collection Time    03/29/14 10:51 PM      Result Value Ref Range Status   Specimen Description URINE, CATHETERIZED   Final   Special Requests NONE   Final   Culture  Setup Time     Final   Value: 03/29/2014 23:32     Performed at [facility]     Final   Value: NO GROWTH     Performed at [facility]   Culture     Final   Value: NO GROWTH     Performed at [facility]   Report Status 03/31/2014 FINAL   Final  CULTURE, BLOOD (ROUTINE X 2)     Status: None   Collection Time    03/29/14 10:55 PM      Result Value Ref Range Status   Specimen Description BLOOD ARM RIGHT   Final   Special Requests BOTTLES DRAWN AEROBIC AND ANAEROBIC 5CC EA   Final   Culture  Setup Time     Final   Value: 03/30/2014 04:10     Performed at [facility]   Culture     Final   Value: NO GROWTH 5 DAYS     Performed at [facility]   Report Status 04/05/2014 FINAL   Final  CULTURE, BLOOD (ROUTINE X 2)     Status: None   Collection Time    03/29/14 11:03 PM      Result Value Ref Range Status   Specimen Description BLOOD HAND LEFT   Final   Special Requests BOTTLES DRAWN AEROBIC ONLY 6CC   Final   Culture  Setup Time     Final   Value: 03/30/2014 04:10     Performed at [facility]   Culture     Final   Value: NO GROWTH 5 DAYS     Performed at [facility]   Report Status 04/05/2014 FINAL   Final  MRSA PCR SCREENING     Status: None   Collection Time    03/30/14  4:20 AM      Result Value Ref Range Status   MRSA by PCR NEGATIVE  NEGATIVE Final   Comment:            The GeneXpert MRSA Assay (FDA     approved for NASAL specimens     only), is one component of a     comprehensive MRSA colonization     surveillance program. It is not     intended to diagnose MRSA     infection nor to guide or     monitor treatment for     MRSA infections.  CULTURE, RESPIRATORY (NON-EXPECTORATED)     Status: None   Collection Time    03/30/14  4:45 AM      Result Value Ref Range Status   Specimen Description TRACHEAL ASPIRATE   Final   Special Requests NONE   Final   Gram Stain     Final   Value: MODERATE WBC PRESENT,BOTH PMN AND MONONUCLEAR     NO SQUAMOUS EPITHELIAL CELLS SEEN     ABUNDANT GRAM POSITIVE COCCI     IN PAIRS     Performed at [facility]   Culture     Final   Value: Non-Pathogenic Oropharyngeal-type Flora Isolated.     Performed at [facility]  Report Status 04/01/2014 FINAL   Final     Labs: Basic Metabolic Panel:  Recent Labs Lab 04/02/14 0309  04/03/14 0300 04/03/14 1550 04/04/14 0428 04/04/14 0430 04/04/14 1600 04/05/14 0500 04/06/14 0510  NA 137  < > 136* 134* 137  --  135* 135* 139  K 4.1  < > 4.0 4.0 4.2  --  4.4 4.3 4.0  CL 102  < > 101 99 103  --  101  101 105  CO2 23  < > 26 25 25   --  25 26 25   GLUCOSE 74  < > 104* 112* 101*  --  91 100* 89  BUN 26*  < > 14 12 9   --  8 7 11   CREATININE 3.64*  < > 2.59* 2.49* 2.08*  --  1.94* 1.93* 2.87*  CALCIUM 7.8*  < > 8.0* 7.4* 7.4*  --  7.5* 7.6* 7.7*  MG 2.3  --  2.3  --   --  2.3  --  2.4  --   PHOS 3.5  < > 2.8 2.4 2.3  --  2.3 2.3  --   < > = values in this interval not displayed. Liver Function Tests:  Recent Labs Lab 04/03/14 0300 04/03/14 1550 04/04/14 0428 04/04/14 1600 04/05/14 0500  ALBUMIN 2.2* 2.1* 2.1* 2.1* 2.1*   No results found for this basename: LIPASE, AMYLASE,  in the last 168 hours No results found for this basename: AMMONIA,  in the last 168 hours CBC:  Recent Labs Lab 04/02/14 0309 04/03/14 0300 04/04/14 0430 04/05/14 0500 04/06/14 0510 04/08/14 1209  WBC 8.1 6.0 4.8 5.1 8.6 6.4  NEUTROABS 5.8 4.1  --  3.3  --   --   HGB 8.0* 8.1* 7.4* 7.4* 7.6* 7.2*  HCT 25.1* 25.4* 23.4* 23.6* 25.0* 22.4*  MCV 90.6 92.7 92.1 92.5 95.1 95.7  PLT 176 172 151 157 206 125*   Cardiac Enzymes:  Recent Labs Lab 04/02/14 0310  CKTOTAL 176  CKMB 3.5   BNP: BNP (last 3 results)  Recent Labs  07/04/13 0640 02/11/14 0800 03/08/14 1224  PROBNP 6732.0* >70000.0* >70000.0*   CBG:  Recent Labs Lab 04/05/14 2016 04/06/14 0011 04/06/14 0411 04/06/14 1210 04/07/14 1128  GLUCAP 94 102* 90 86 92       Signed:  Tharun Cappella K Brandyce Dimario  [facility] 04/08/2014, 4:13 PM

## 2014-04-08 NOTE — Progress Notes (Signed)
Subjective:  In room with Girlfriend, no cos, pleasantly confused to time  Objective Vital signs in last 24 hours: Filed Vitals:   04/07/14 1953 04/08/14 0601 04/08/14 0912 04/08/14 0932  BP: 139/84 161/101 191/121 196/125  Pulse: 70 71 75 77  Temp: 97.7 F (36.5 C) 98.9 F (37.2 C)    TempSrc: Oral Oral    Resp: 16 17    Height:      Weight:  76.25 kg (168 lb 1.6 oz)    SpO2: 100% 98%     Weight change: -1.25 kg (-2 lb 12.1 oz)  Physical Exam: General:Drowsy, and talking /NAD Heart: RRR, S4,  No rub, soft 1/6 sem Apex Lungs: CTA bilat Abdomen: BSpos. ,soft, nontender, nondistended Extremities: 1+ bipedal edema  Dialysis Access: pos bruit LUA AVF  HD: MWF [facility]  4h 79kg 2/2.25 Bath 300/800 Heparin none due to bleed (was 8000) LUA AVF  Hectorol 4 Aranesp 100 Q Wed Venofer 100/hd thru 5/20  Last tsat 12%   Assessment:  1 Acute CVA - left temporal lobe, hemorrhagic  2 NICM EF 25-30%  3 ESRD on HD MWF nl , Today for excess vol bp control  4 Anemia Hb 7's  ( hgb  7.6 this am )on aranesp 200/wk  5 HPTH renagel on hold, Ca/phos stable, no meds  6 HTN/ vol excess- cont 4 BP meds, cont to lower vol w HD   Plan- HD t today  For  lower volume, check fe/tibc, transfuse prn/ In pt rehab noted seeing pt for eval.   Lenny Pastel, PA-C [facility] Beeper [phone] 04/08/2014,10:05 AM  LOS: 10 days   Pt seen, examined and agree w A/P as above.  Vinson Moselle MD pager [phone]    cell [phone] 04/08/2014, 11:47 AM    Labs: Basic Metabolic Panel:  Recent Labs Lab 04/04/14 0428 04/04/14 1600 04/05/14 0500 04/06/14 0510  NA 137 135* 135* 139  K 4.2 4.4 4.3 4.0  CL 103 101 101 105  CO2 25 25 26 25   GLUCOSE 101* 91 100* 89  BUN 9 8 7 11   CREATININE 2.08* 1.94* 1.93* 2.87*  CALCIUM 7.4* 7.5* 7.6* 7.7*  PHOS 2.3 2.3 2.3  --    Liver Function Tests:  Recent Labs Lab 04/04/14 0428 04/04/14 1600 04/05/14 0500  ALBUMIN 2.1* 2.1* 2.1*    CBC:  Recent Labs Lab 04/02/14 0309 04/03/14 0300 04/04/14 0430 04/05/14 0500 04/06/14 0510  WBC 8.1 6.0 4.8 5.1 8.6  NEUTROABS 5.8 4.1  --  3.3  --   HGB 8.0* 8.1* 7.4* 7.4* 7.6*  HCT 25.1* 25.4* 23.4* 23.6* 25.0*  MCV 90.6 92.7 92.1 92.5 95.1  PLT 176 172 151 157 206   Cardiac Enzymes:  Recent Labs Lab 04/02/14 0310  CKTOTAL 176  CKMB 3.5   CBG:  Recent Labs Lab 04/05/14 2016 04/06/14 0011 04/06/14 0411 04/06/14 1210 04/07/14 1128  GLUCAP 94 102* 90 86 92    Medications:   . amLODipine  10 mg Oral Daily  . carvedilol  25 mg Oral BID WC  . cloNIDine  0.3 mg Oral TID  . darbepoetin (ARANESP) injection - DIALYSIS  200 mcg Intravenous Q Thu-HD  . feeding supplement (ENSURE)  1 Container Oral BID  . hydrALAZINE  100 mg Oral 3 times per day  . levETIRAcetam  500 mg Intravenous Q12H  . phenytoin  100 mg Oral TID

## 2014-04-08 NOTE — Procedures (Signed)
I was present at this session.  I have reviewed the session itself and made appropriate changes.  Bp ^, do back to back. Vol xs. Flow 300 via avf  Llana Aliment Elpidio Thielen 5/21/20154:26 PM

## 2014-04-08 NOTE — Progress Notes (Signed)
Rehab Admissions Coordinator Note:  Patient was screened by Trish Mage for appropriateness for an Inpatient Acute Rehab Consult.  At this time, an inpatient rehab consult has been ordered and is pending completion.  Matthew Beard Odis Turck 04/08/2014, 10:20 AM  I can be reached at [phone].

## 2014-04-08 NOTE — Procedures (Signed)
Patient was seen on dialysis and the procedure was supervised.  BFR 300  Via AVF BP is  176/107 .   Patient appears to be tolerating treatment well  Cecille Aver 04/08/2014

## 2014-04-09 LAB — TYPE AND SCREEN
ABO/RH(D): A POS
ANTIBODY SCREEN: NEGATIVE
Unit division: 0
Unit division: 0

## 2014-04-09 LAB — RENAL FUNCTION PANEL
ALBUMIN: 2.7 g/dL — AB (ref 3.5–5.2)
BUN: 22 mg/dL (ref 6–23)
CHLORIDE: 104 meq/L (ref 96–112)
CO2: 25 mEq/L (ref 19–32)
Calcium: 8.1 mg/dL — ABNORMAL LOW (ref 8.4–10.5)
Creatinine, Ser: 4.18 mg/dL — ABNORMAL HIGH (ref 0.50–1.35)
GFR calc Af Amer: 19 mL/min — ABNORMAL LOW (ref >90)
GFR calc non Af Amer: 17 mL/min — ABNORMAL LOW (ref >90)
GLUCOSE: 114 mg/dL — AB (ref 70–99)
POTASSIUM: 3.9 meq/L (ref 3.7–5.3)
Phosphorus: 3.6 mg/dL (ref 2.3–4.6)
SODIUM: 142 meq/L (ref 137–147)

## 2014-04-09 LAB — CBC
HEMATOCRIT: 28.3 % — AB (ref 39.0–52.0)
HEMOGLOBIN: 9 g/dL — AB (ref 13.0–17.0)
MCH: 29.6 pg (ref 26.0–34.0)
MCHC: 31.8 g/dL (ref 30.0–36.0)
MCV: 93.1 fL (ref 78.0–100.0)
Platelets: 74 10*3/uL — ABNORMAL LOW (ref 150–400)
RBC: 3.04 MIL/uL — AB (ref 4.22–5.81)
RDW: 19.1 % — ABNORMAL HIGH (ref 11.5–15.5)
WBC: 7.4 10*3/uL (ref 4.0–10.5)

## 2014-04-09 MED ORDER — RENA-VITE PO TABS
1.0000 | ORAL_TABLET | Freq: Every day | ORAL | Status: DC
  Administered 2014-04-10 – 2014-04-20 (×12): 1 via ORAL
  Filled 2014-04-09 (×13): qty 1

## 2014-04-09 MED ORDER — LEVETIRACETAM 100 MG/ML PO SOLN
500.0000 mg | Freq: Two times a day (BID) | ORAL | Status: DC
  Administered 2014-04-09 – 2014-04-21 (×24): 500 mg via ORAL
  Filled 2014-04-09 (×25): qty 5

## 2014-04-09 MED ORDER — LISINOPRIL 10 MG PO TABS
10.0000 mg | ORAL_TABLET | Freq: Two times a day (BID) | ORAL | Status: DC
  Administered 2014-04-10 – 2014-04-17 (×16): 10 mg via ORAL
  Filled 2014-04-09 (×18): qty 1

## 2014-04-09 MED ORDER — LEVETIRACETAM 500 MG PO TABS
500.0000 mg | ORAL_TABLET | Freq: Two times a day (BID) | ORAL | Status: DC
  Filled 2014-04-09 (×2): qty 1

## 2014-04-09 MED ORDER — LEVETIRACETAM 500 MG PO TABS
500.0000 mg | ORAL_TABLET | Freq: Two times a day (BID) | ORAL | Status: DC
  Filled 2014-04-09: qty 1

## 2014-04-09 NOTE — Progress Notes (Signed)
Speech Language Pathology Treatment: Dysphagia;Cognitive-Linquistic  Patient Details Name: Matthew Beard MRN: [id number] DOB: Oct 25, 1976 Today's Date: 04/09/2014 Time: 1030-1314 SLP Time Calculation (min): 17 min  Assessment / Plan / Recommendation Clinical Impression  Pt consumed regular textures and nectar thick liquids with mildly prolonged bolus formation with solids. Mod cues were provided for utilization of safe swallowing strategies, particularly a slow rate. SLP adjusted the environment by turning off the TV to maximize sustained attention. Pt responded fairly quickly and appropriately to yes/no questions, however did not respond to open-ended questions despite Max cues. Pt named family members in photographs and stated their relationship to him with Max cues for initiation. Continue plan of care.   HPI HPI: Matthew Beard is a 38 y.o. 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  Intubated on admission 5/11. Pt was extubated on 04/02/14. CT brain revealed a focus of parenchymal hemorrhage at the junction of the left temporal and left parietal lobes.  BSE recommended dys 3/Nectar thick liquids. ST in for follow up to determine readiness to advance diet.   Pertinent Vitals N/A  SLP Plan  Continue with current plan of care    Recommendations Diet recommendations: Dysphagia 3 (mechanical soft);Nectar-thick liquid Liquids provided via: Cup;No straw Medication Administration: Crushed with puree Supervision: Full supervision/cueing for compensatory strategies;Patient able to self feed Compensations: Slow rate;Small sips/bites;Check for pocketing Postural Changes and/or Swallow Maneuvers: Seated upright 90 degrees       Oral Care Recommendations: Oral care BID Follow up Recommendations:  Inpatient Rehab Plan: Continue with current plan of care    GO      Matthew Beard, M.A. CCC-SLP [phone]  Matthew Beard 04/09/2014, 4:29 PM

## 2014-04-09 NOTE — Progress Notes (Signed)
OT Cancellation Note  Patient Details Name: Matthew Beard MRN: [id number] DOB: 1976/11/14   Cancelled Treatment:    Reason Eval/Treat Not Completed: Patient at procedure or test/ unavailable.  Pt is in HD.  Will check back another day  [facility] 04/09/2014, 12:02 PM Marica Otter, OTR/L [phone] 04/09/2014

## 2014-04-09 NOTE — Progress Notes (Signed)
Subjective:   No complaints. Oriented to person and place but doesn't know why he is in the hospital  Admissions:  Apr 2009-- chest pain, ^BP did not fill Rx's for Lotrel and HTCZ. W/U negative, CT chest neg for PE, normal LV fxn. Penile ulcer seen by urology, thought to be infectious, rx with doxy 100 bid x 3 wks  Mar 2010-- HTN urgency, medical noncompliance due to loss of insurance, dental infection, acute bronchitis  Sept 2010-- malignant HTN, acute pericarditis, CAP, anemia, tobacco use, severe LVH > d/c'd on minoxidil 5 bid + HCTZ, acei, BB and norvasc. MRA abd no RAS  Mar 2013-- HTN crisis, BP 235/157 in ED, severe HA. Rx cardene drip then oral meds. At d/c BP was 160/120 and HA's were resolved. New dx of kidney failure  Aug 2014-- presented to ED with one month hx of progressive SOB, PND and orthopnea. In ED BP was very high, pt admitted to medication noncompliance. Rx'd w cardene drip and BP improved. ECHO w 20% LVEF. Had +ANA a:1280 speckled, ^RF. Repeat ordered. Urine and plasma metanephrines and normetanephrines were elevated and abd CT was done which showed no findings of adrenal mass. Diuresed with IV lasix. D/C"d home.  Oct 2014-- pt presented with epistaxis and markedly elevated BP. Hx of noncompliance w medications due to financial difficulty. Treated w IV meds for BP then transitioned to PO meds. Counseled regarding compliance. CKD stage IV. Progressive.  Dec 2014-- L wrist Cimino AVF  Mar 2015-- acute on CKD IV, uncontrolled HTN and pulm edema with SOB. Diuresed with IV lasix, d/c'd on high dose po lasix and po zaroxlyn. EF 20% by echo. No HD yet, stage V CKD.  April 2015-- pt presented with SOB, DOE, not getting around the house well, leg edema up to the waist. Pt admitted and started on HD, edema improved, BP improved with meds and HD. D/C BP meds were norvasc, hydralzaine,, Coreg and Catapres. ^LFTs felt due to congestion from acute CHF exacerbation. D/C"d home on HD at OP tiw.    Objective Filed Vitals:   04/09/14 0927 04/09/14 1008 04/09/14 1021 04/09/14 1030  BP: 213/119 191/122 208/122 189/121  Pulse: 81  145 82  Temp: 98 F (36.7 C) 98.6 F (37 C)    TempSrc: Oral Oral    Resp: 18 18 18 19   Height:      Weight:  72.9 kg (160 lb 11.5 oz)    SpO2: 97% 95%     Physical Exam General: alert, on HD. No acute distress Heart: RRR Lungs: CTA, unlabored Abdomen: soft, nontender +BS Extremities: trace to +1 LE edema Dialysis Access: L AVF patent on HD  HD: MWF [facility]  4h 79kg 2/2.25 Bath 300/800 Heparin none due to bleed (was 8000) LUA AVF  Hectorol 4 Aranesp 100 Q Wed Venofer 100/hd thru 5/20  Last tsat 12%    Assessment:  1 Acute CVA - left temporal lobe, hemorrhagic 2 HTN uncontrolled- lowering vol w HD, added lisinopril today, BP still very high 3 ESRD on HD MWF, extra tx yesterday for excess vol bp control. HD now, uf goal 4500. 4 Anemia Hb 9, s/p1u RBC transfusion 5.21. on aranesp 200/wk Thursday 5 HPTH Ca+ 8.1. Phos 3.6 renagel on hold 6 HTN/ vol excess-  Remains elevated despite norvasc, coreg, clonidine, hydralazine. 7 nutrition- alb 2.7 dsy diet. multivit 8 NICM EF 25-30% 8 Dispo- to inpatient rehab   Jetty Duhamel, NP [facility] Beeper [phone] 04/09/2014,11:31 AM  LOS:  11 days   Pt seen, examined and agree w A/P as above. Added lisinopril and lowering volume more.  BP still high, this has been a chronic issue with patient (outpatient HD 5/2 pre 206/139 > post 193/133, 5/6 pre 220/157 > post 158/93, 5/8 pre 190/128 > 205/133).  Have added lisinopril and if that doesn't help will add minoxidil tomorrow if still here. He doesn't appear to have extra volume on exam as of late in HD today.  Vinson Moselleob Aemon Koeller MD pager [phone]    cell [phone] 04/09/2014, 12:51 PM    Additional Objective Labs: Basic Metabolic Panel:  Recent Labs Lab 04/04/14 0428 04/04/14 1600 04/05/14 0500 04/06/14 0510  NA 137 135* 135* 139  K  4.2 4.4 4.3 4.0  CL 103 101 101 105  CO2 25 25 26 25   GLUCOSE 101* 91 100* 89  BUN 9 8 7 11   CREATININE 2.08* 1.94* 1.93* 2.87*  CALCIUM 7.4* 7.5* 7.6* 7.7*  PHOS 2.3 2.3 2.3  --    Liver Function Tests:  Recent Labs Lab 04/04/14 0428 04/04/14 1600 04/05/14 0500  ALBUMIN 2.1* 2.1* 2.1*   No results found for this basename: LIPASE, AMYLASE,  in the last 168 hours CBC:  Recent Labs Lab 04/03/14 0300 04/04/14 0430 04/05/14 0500 04/06/14 0510 04/08/14 1209 04/09/14 1030  WBC 6.0 4.8 5.1 8.6 6.4 7.4  NEUTROABS 4.1  --  3.3  --   --   --   HGB 8.1* 7.4* 7.4* 7.6* 7.2* 9.0*  HCT 25.4* 23.4* 23.6* 25.0* 22.4* 28.3*  MCV 92.7 92.1 92.5 95.1 95.7 93.1  PLT 172 151 157 206 125* PENDING   Blood Culture    Component Value Date/Time   SDES TRACHEAL ASPIRATE 03/30/2014 0445   SPECREQUEST NONE 03/30/2014 0445   CULT  Value: Non-Pathogenic Oropharyngeal-type Flora Isolated. Performed at [facility] 03/30/2014 0445   REPTSTATUS 04/01/2014 FINAL 03/30/2014 0445    Cardiac Enzymes: No results found for this basename: CKTOTAL, CKMB, CKMBINDEX, TROPONINI,  in the last 168 hours CBG:  Recent Labs Lab 04/05/14 2016 04/06/14 0011 04/06/14 0411 04/06/14 1210 04/07/14 1128  GLUCAP 94 102* 90 86 92   Iron Studies: No results found for this basename: IRON, TIBC, TRANSFERRIN, FERRITIN,  in the last 72 hours @lablastinr3 @ Studies/Results: No results found. Medications:   . amLODipine  10 mg Oral Daily  . carvedilol  25 mg Oral BID WC  . cloNIDine  0.3 mg Oral TID  . darbepoetin (ARANESP) injection - DIALYSIS  200 mcg Intravenous Q Thu-HD  . feeding supplement (ENSURE)  1 Container Oral BID  . hydrALAZINE  100 mg Oral 3 times per day  . isosorbide mononitrate  30 mg Oral Daily  . levETIRAcetam  500 mg Intravenous Q12H  . lisinopril  10 mg Oral BID  . phenytoin  100 mg Oral TID

## 2014-04-09 NOTE — Progress Notes (Signed)
MEDICATION RELATED CONSULT NOTE - INITIAL   Pharmacy Consult for phenytoin Indication: seizures  Allergies  Allergen Reactions  . Amoxicillin Hives    Patient Measurements: Height: 5\' 10"  (177.8 cm) Weight: 160 lb 11.5 oz (72.9 kg) IBW/kg (Calculated) : 73   Vital Signs: Temp: 98.6 F (37 C) (05/22 1008) Temp src: Oral (05/22 1008) BP: 212/133 mmHg (05/22 1130) Pulse Rate: 81 (05/22 1130) Intake/Output from previous day: 05/21 0701 - 05/22 0700 In: 890 [P.O.:220; Blood:670] Out: 4391 [Urine:1; Stool:400] Intake/Output from this shift: Total I/O In: 120 [P.O.:120] Out: -   Labs:  Recent Labs  04/08/14 1209 04/09/14 1030  WBC 6.4 7.4  HGB 7.2* 9.0*  HCT 22.4* 28.3*  PLT 125* 74*  CREATININE  --  4.18*  PHOS  --  3.6  ALBUMIN  --  2.7*   Estimated Creatinine Clearance: 24.5 ml/min (by C-G formula based on Cr of 4.18).   Microbiology: Recent Results (from the past 720 hour(s))  URINE CULTURE     Status: None   Collection Time    03/29/14 10:51 PM      Result Value Ref Range Status   Specimen Description URINE, CATHETERIZED   Final   Special Requests NONE   Final   Culture  Setup Time     Final   Value: 03/29/2014 23:32     Performed at [facility] Count     Final   Value: NO GROWTH     Performed at [facility]   Culture     Final   Value: NO GROWTH     Performed at [facility]   Report Status 03/31/2014 FINAL   Final  CULTURE, BLOOD (ROUTINE X 2)     Status: None   Collection Time    03/29/14 10:55 PM      Result Value Ref Range Status   Specimen Description BLOOD ARM RIGHT   Final   Special Requests BOTTLES DRAWN AEROBIC AND ANAEROBIC 5CC EA   Final   Culture  Setup Time     Final   Value: 03/30/2014 04:10     Performed at [facility]   Culture     Final   Value: NO GROWTH 5 DAYS     Performed at [facility]   Report Status 04/05/2014 FINAL   Final  CULTURE, BLOOD (ROUTINE X 2)      Status: None   Collection Time    03/29/14 11:03 PM      Result Value Ref Range Status   Specimen Description BLOOD HAND LEFT   Final   Special Requests BOTTLES DRAWN AEROBIC ONLY 6CC   Final   Culture  Setup Time     Final   Value: 03/30/2014 04:10     Performed at [facility]   Culture     Final   Value: NO GROWTH 5 DAYS     Performed at [facility]   Report Status 04/05/2014 FINAL   Final  MRSA PCR SCREENING     Status: None   Collection Time    03/30/14  4:20 AM      Result Value Ref Range Status   MRSA by PCR NEGATIVE  NEGATIVE Final   Comment:            The GeneXpert MRSA Assay (FDA     approved for NASAL specimens     only), is one component of a  comprehensive MRSA colonization     surveillance program. It is not     intended to diagnose MRSA     infection nor to guide or     monitor treatment for     MRSA infections.  CULTURE, RESPIRATORY (NON-EXPECTORATED)     Status: None   Collection Time    03/30/14  4:45 AM      Result Value Ref Range Status   Specimen Description TRACHEAL ASPIRATE   Final   Special Requests NONE   Final   Gram Stain     Final   Value: MODERATE WBC PRESENT,BOTH PMN AND MONONUCLEAR     NO SQUAMOUS EPITHELIAL CELLS SEEN     ABUNDANT GRAM POSITIVE COCCI     IN PAIRS     Performed at [facility]   Culture     Final   Value: Non-Pathogenic Oropharyngeal-type Flora Isolated.     Performed at [facility]   Report Status 04/01/2014 FINAL   Final   Assessment: [age] YOM originally admitted to ICU with seizures and ICH. He was started on phenytoin on 5/16 by neurology who has now signed off. Levels obtained on 5/18 revealed a corrected phenytoin level of 13.2mg /L and free (unbound) phenytoin level of 0.9 mg/L which is slightly low. However, these levels were not drawn at steady state of phenytoin and therefore are difficult to assess. Pharmacy to take over dosing of phenytoin per MD request.  Goal of Therapy:   Total corrected phenytoin level 10-20mg /L  Plan:  1. Check albumin and total phenytoin level with tomorrow morning's labs 2. Will dose adjust as needed. Will try to make regimen as easy for patient as possible  Ataya Murdy D. Misael Mcgaha, PharmD, BCPS Clinical Pharmacist Pager: [phone] 04/09/2014 12:12 PM

## 2014-04-09 NOTE — Procedures (Signed)
Patient was seen on dialysis and the procedure was supervised.  BFR 300  Via AVF BP is  212/133.   Patient appears to be tolerating treatment well  Matthew Beard 04/09/2014

## 2014-04-09 NOTE — Progress Notes (Addendum)
[facility] PROGRESS NOTE  BEDFORD WINSOR ZOX:[id number] DOB: 07-Nov-1976 DOA: 03/29/2014 PCP: [name], MD  Interim summary:  Patient is a 38 year old African American male past medical history of end-stage renal disease with noncompliant with dialysis, hypertension, nonischemic cardiomyopathy and secondary congestive heart failure who was brought into the emergency room on 5/11 after he was witnessed at home to fall backwards and then have was reported seizure-like activity. In the emergency room he is found to have blood pressures as high as 249/144 and a head CT revealed a small focal hemorrhage at the junction of the left parietal and temporal lobes. Patient was intubated for protection of airway and as well as agitation. He had been just discharged from the hospital 2 weeks prior for volume overload after he was noncompliance with dialysis at that time.  In the ICU, neurology consulted. Recommendation was for systolic blood pressure between 120-160 which proved to be difficult requiring multiple medication drips. He was put on Keppra for seizures prevention. MRI done 5/13 noted a 1.8 acute left temporal lobe bleed with multiple microinfarcts and bleed. Patient was suspected of having aspiration pneumonia and started on antibiotics which completed its course on 5/18. Echocardiogram done noted drop in ejection fraction from 50% down to 25% as well as global hypokinesis. Cardiology was consulted and felt the patient likely had hypertensive component out cardiomyopathy. Patient was continued on CVVH by neurology.   Sedation was discontinued but patient was not awake enough for extubation. Palliative care was consulted and met with family on 5/15. Family was able to understand poor long-term prognosis. Patient was kept as full code. He was able to be extubated on 5/16. At this point, CVVH was stopped and conventional hemodialysis was started on 5/18. He tolerated this well and transferred to the  renal 4 on 5/19 and hospitalists assumed care on 5/20. He appears to be more awake and answers questions slowly, but appropriately.    Assessment/Plan: Principal Problem:   ICH (intracerebral hemorrhage)/acute hemorrhagic CVA: Stabilized.  Dysphagia: Seen by speech therapy. Current recommendation is for dysphasia 3 with nectar thick liquids.  Active Problems:   Uncontrolled hypertension: Blood pressure chronically severely elevated. Continue carvedilol, amlodipine, hydralazine, Imdur and clonidine. Lisinopril added today and if blood pressure is not better then nephrology plans to add minoxidil in a.m. Does not seem volume overloaded on exam.    Anemia in chronic kidney disease: Status post 1 unit PRBC transfusion on 5/21. Hemoglobin stable. Mild thrombocytopenia unclear etiology. Continue Aranesp    Cardiomyopathy EF 25-30%- etiol undetermined- EF varies- 20% Aug 2014, 40% Nov 2014, 50% March 2015    ESRD- HD since April 2015: Continued on dialysis. Last session was 5/19. Nephrology following.    Acute respiratory failure with hypoxia: Resolved.  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    Encephalopathy: Slowly improving  Aspiration pneumonia: Treated    Code Status: Full code  Family Communication: None at bedside.  Disposition Plan: Appropriate for CIR-waiting to see if family can confirm 24/7 supervision and minimal assistance post discharge from CIR in which case they will accept pending bed availability. However if 24/7 support is not available then we'll have to go to SNF.   Consultants:  Critical care  Nephrology  Neurology  Cardiology  Palliative care  Procedures:  ETT 5/12 >>>5/15  Rt IJ Trialysis cath 03/30/14 >> 5/19  Echocardiogram done 5/12:  Decreased ejection fraction of 65-78%, diastolic flattening, moderate dilatation of right and left  atrium and ventricles. Severe LVH   Antibiotics:  Vanc 5/11 >>5/11   Maxipime 5/11 >>>5/12   Imipenem 5/12 >>5/13   Levaquin 5/13 >> 5/18   HPI/Subjective: Patient denies complaints.  Objective: Filed Vitals:   04/09/14 1230  BP: 204/125  Pulse: 82  Temp:   Resp: 23   temperature 98.16F, oxygen saturation 95% on room air.  Intake/Output Summary (Last 24 hours) at 04/09/14 1311 Last data filed at 04/09/14 0936  Gross per 24 hour  Intake    790 ml  Output   4391 ml  Net  -3601 ml   Filed Weights   04/08/14 1632 04/08/14 2158 04/09/14 1008  Weight: 74 kg (163 lb 2.3 oz) 75.4 kg (166 lb 3.6 oz) 72.9 kg (160 lb 11.5 oz)    Exam:   General:  Alert and oriented x2, no acute distress. Flat affect. Lying supine in bed.  Cardiovascular: Regular rate and rhythm, I6-N6, 2/6 systolic ejection murmur  Respiratory: Clear to auscultation bilaterally. No increased work of breathing  Abdomen: Soft, nontender, nondistended, positive bowel sounds  Musculoskeletal: No clubbing or cyanosis, trace edema   CNS: Alert and oriented x2. No focal deficits.  Data Reviewed: Basic Metabolic Panel:  Recent Labs Lab 04/03/14 0300 04/03/14 1550 04/04/14 0428 04/04/14 0430 04/04/14 1600 04/05/14 0500 04/06/14 0510 04/09/14 1030  NA 136* 134* 137  --  135* 135* 139 142  K 4.0 4.0 4.2  --  4.4 4.3 4.0 3.9  CL 101 99 103  --  101 101 105 104  CO2 _0 --  _1 GLUCOSE 104* 112* 101*  --  91 100* 89 114*  BUN _2 --  _3 CREATININE 2.59* 2.49* 2.08*  --  1.94* 1.93* 2.87* 4.18*  CALCIUM 8.0* 7.4* 7.4*  --  7.5* 7.6* 7.7* 8.1*  MG 2.3  --   --  2.3  --  2.4  --   --   PHOS 2.8 2.4 2.3  --  2.3 2.3  --  3.6   Liver Function Tests:  Recent Labs Lab 04/03/14 1550 04/04/14 0428 04/04/14 1600 04/05/14 0500 04/09/14 1030  ALBUMIN 2.1* 2.1* 2.1* 2.1* 2.7*   No results found for this basename: LIPASE, AMYLASE,  in the last 168 hours No results found  for this basename: AMMONIA,  in the last 168 hours CBC:  Recent Labs Lab 04/03/14 0300 04/04/14 0430 04/05/14 0500 04/06/14 0510 04/08/14 1209 04/09/14 1030  WBC 6.0 4.8 5.1 8.6 6.4 7.4  NEUTROABS 4.1  --  3.3  --   --   --   HGB 8.1* 7.4* 7.4* 7.6* 7.2* 9.0*  HCT 25.4* 23.4* 23.6* 25.0* 22.4* 28.3*  MCV 92.7 92.1 92.5 95.1 95.7 93.1  PLT 172 151 157 206 125* 74*   Cardiac Enzymes: No results found for this basename: CKTOTAL, CKMB, CKMBINDEX, TROPONINI,  in the last 168 hours BNP (last 3 results)  Recent Labs  07/04/13 0640 02/11/14 0800 03/08/14 1224  PROBNP 6732.0* >70000.0* >70000.0*   CBG:  Recent Labs Lab 04/05/14 2016 04/06/14 0011 04/06/14 0411 04/06/14 1210 04/07/14 1128  GLUCAP 94 102* 90 86 92    No results found for this or any previous visit (from the past 240 hour(s)).   Studies: No results found.  Scheduled Meds: . amLODipine  10 mg Oral Daily  .  carvedilol  25 mg Oral BID WC  . cloNIDine  0.3 mg Oral TID  . darbepoetin (ARANESP) injection - DIALYSIS  200 mcg Intravenous Q Thu-HD  . feeding supplement (ENSURE)  1 Container Oral BID  . hydrALAZINE  100 mg Oral 3 times per day  . isosorbide mononitrate  30 mg Oral Daily  . levETIRAcetam  500 mg Oral Q12H  . lisinopril  10 mg Oral BID  . multivitamin  1 tablet Oral QHS  . phenytoin  100 mg Oral TID   Continuous Infusions:   Principal Problem:   ICH (intracerebral hemorrhage) Active Problems:   Uncontrolled hypertension   Anemia in chronic kidney disease   Cardiomyopathy EF 25-30%- etiol undetermined- EF varies- 20% Aug 2014, 40% Nov 2014, 50% March 2015   ESRD- HD since April 2015   Acute respiratory failure with hypoxia   Noncompliance with medication regimen in past- mult adm for uncontrolled HTN   Convulsions/seizures   Encephalopathy    Time spent: 25 minutes    Modena Jansky, MD, FACP, [facility]. [facility] Pager [phone]  If 7PM-7AM, please contact  night-coverage [email or web address] Password [email or web address] 04/09/2014, 1:19 PM  04/09/2014, 1:11 PM  LOS: 11 days

## 2014-04-09 NOTE — Progress Notes (Signed)
PT Cancellation Note  Pt. Has been in HD since about 1000.  Session cancelled for today.    Weldon Picking PT Acute Rehab Services [phone] Beeper [phone]

## 2014-04-09 NOTE — Progress Notes (Signed)
Rehab admissions - I am following pt's case and noted rehab MD's completed consult which recommended supervision and minimal assistance from support system. Patient was gone to dialysis and I then called and spoke with pt's fiance Stephanie.   She shared that she works a typical work week as a Therapist, nutritional but often has Monday's off. I reiterated the recommendation that pt will need supervision and minimal assistance at the end of a potential rehab stay.   Pt's fiance stated that the other support options were limited but that maybe pt's nephew could stay with pt while she is at work. Fiance is to contact nephew to see if this is a realistic option and get back to me.  At this point, we will consider inpatient admit if family can confirm 24-7 supervision and minimal assistance support and pending our bed availability/pt's medical clearance.  If family cannot provide 24 hr assistance, then skilled nursing will need to be pursued.  I will follow pt's case. Please call me with any questions. Thanks.  Juliann Mule, PT Rehabilitation Admissions Coordinator [phone]

## 2014-04-10 DIAGNOSIS — D696 Thrombocytopenia, unspecified: Secondary | ICD-10-CM

## 2014-04-10 LAB — CBC
HCT: 31 % — ABNORMAL LOW (ref 39.0–52.0)
Hemoglobin: 9.6 g/dL — ABNORMAL LOW (ref 13.0–17.0)
MCH: 29.4 pg (ref 26.0–34.0)
MCHC: 31 g/dL (ref 30.0–36.0)
MCV: 94.8 fL (ref 78.0–100.0)
Platelets: 52 10*3/uL — ABNORMAL LOW (ref 150–400)
RBC: 3.27 MIL/uL — AB (ref 4.22–5.81)
RDW: 18.6 % — ABNORMAL HIGH (ref 11.5–15.5)
WBC: 7.4 10*3/uL (ref 4.0–10.5)

## 2014-04-10 LAB — ALBUMIN: ALBUMIN: 2.6 g/dL — AB (ref 3.5–5.2)

## 2014-04-10 LAB — PHENYTOIN LEVEL, TOTAL: PHENYTOIN LVL: 3 ug/mL — AB (ref 10.0–20.0)

## 2014-04-10 MED ORDER — PHENYTOIN SODIUM EXTENDED 100 MG PO CAPS
200.0000 mg | ORAL_CAPSULE | Freq: Two times a day (BID) | ORAL | Status: DC
  Administered 2014-04-10 – 2014-04-21 (×22): 200 mg via ORAL
  Filled 2014-04-10 (×23): qty 2

## 2014-04-10 NOTE — Progress Notes (Signed)
[facility] PROGRESS NOTE  Matthew Beard OAC:[id number] DOB: 06-12-1976 DOA: 03/29/2014 PCP: [name], MD  Interim summary:  Matthew Beard is a 38 year old African American male past medical history of end-stage renal disease with noncompliant with dialysis, hypertension, nonischemic cardiomyopathy and secondary congestive heart failure who was brought into the emergency room on 5/11 after he was witnessed at home to fall backwards and then have was reported seizure-like activity. In the emergency room he is found to have blood pressures as high as 249/144 and a head CT revealed a small focal hemorrhage at the junction of the left parietal and temporal lobes. Matthew Beard was intubated for protection of airway and as well as agitation. He had been just discharged from the hospital 2 weeks prior for volume overload after he was noncompliance with dialysis at that time.  In the ICU, neurology consulted. Recommendation was for systolic blood pressure between 120-160 which proved to be difficult requiring multiple medication drips. He was put on Keppra for seizures prevention. MRI done 5/13 noted a 1.8 acute left temporal lobe bleed with multiple microinfarcts and bleed. Matthew Beard was suspected of having aspiration pneumonia and started on antibiotics which completed its course on 5/18. Echocardiogram done noted drop in ejection fraction from 50% down to 25% as well as global hypokinesis. Cardiology was consulted and felt the Matthew Beard likely had hypertensive component out cardiomyopathy. Matthew Beard was continued on CVVH by neurology.   Sedation was discontinued but Matthew Beard was not awake enough for extubation. Palliative care was consulted and met with family on 5/15. Family was able to understand poor long-term prognosis. Matthew Beard was kept as full code. He was able to be extubated on 5/16. At this point, CVVH was stopped and conventional hemodialysis was started on 5/18. He tolerated this well and transferred to the  renal 4 on 5/19 and hospitalists assumed care on 5/20. He appears to be more awake and answers questions slowly, but appropriately.    Assessment/Plan: Principal Problem:   ICH (intracerebral hemorrhage)/acute hemorrhagic CVA: Stabilized.  Dysphagia: Seen by speech therapy. Current recommendation is for dysphasia 3 with nectar thick liquids.  Active Problems:   Uncontrolled hypertension: Blood pressure chronically severely elevated. Continue carvedilol, amlodipine, hydralazine, Imdur and clonidine. Lisinopril added today and if blood pressure is not better then nephrology plans to add minoxidil. Does not seem volume overloaded on exam. Blood pressure seems better.    Anemia in chronic kidney disease: Status post 1 unit PRBC transfusion on 5/21. Hemoglobin stable. Continue Aranesp.  Thrombocytopenia: Continued gradual decline in platelet count 206 > 125 > 74 > 52. Unclear etiology. Does not seem to have received heparin since admission. Follow CBC in a.m. If continues to drop then consider further evaluation.    Cardiomyopathy EF 25-30%- etiol undetermined- EF varies- 20% Aug 2014, 40% Nov 2014, 50% March 2015    ESRD- HD since April 2015: Continued on dialysis. Nephrology following.    Acute respiratory failure with hypoxia: Resolved.  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    Encephalopathy: Slowly improving  Aspiration pneumonia: Treated    Code Status: Full code  Family Communication: Discussed with Matthew Beard's brother Elberta Fortis and his wife at bedside on 5/23. They indicate that they will be able to provide 24/7 supervision and assistance at home after his discharge from [facility].  Disposition Plan: Appropriate for CIR-waiting to see if family can confirm 24/7 supervision and minimal  assistance post discharge from CIR in which case they will accept pending bed availability.  However if 24/7 support is not available then we'll have to go to SNF.   Consultants:  Critical care  Nephrology  Neurology  Cardiology  Palliative care  Procedures:  ETT 5/12 >>>5/15  Rt IJ Trialysis cath 03/30/14 >> 5/19  Echocardiogram done 5/12: Decreased ejection fraction of 68-11%, diastolic flattening, moderate dilatation of right and left atrium and ventricles. Severe LVH   Antibiotics:  Vanc 5/11 >>5/11   Maxipime 5/11 >>>5/12   Imipenem 5/12 >>5/13   Levaquin 5/13 >> 5/18   HPI/Subjective: Matthew Beard denies complaints. Tired this morning after working with PT.  Objective: Filed Vitals:   04/10/14 0830  BP: 187/114  Pulse: 79  Temp: 98.4 F (36.9 C)  Resp: 18    Intake/Output Summary (Last 24 hours) at 04/10/14 1552 Last data filed at 04/10/14 1435  Gross per 24 hour  Intake    720 ml  Output    600 ml  Net    120 ml   Filed Weights   04/08/14 2158 04/09/14 1008 04/09/14 1358  Weight: 75.4 kg (166 lb 3.6 oz) 72.9 kg (160 lb 11.5 oz) 67.3 kg (148 lb 5.9 oz)    Exam:   General:  Alert and oriented x2, no acute distress. Flat affect. Sitting comfortably on chair.  Cardiovascular: Regular rate and rhythm, X7-W6, 2/6 systolic ejection murmur  Respiratory: Clear to auscultation bilaterally. No increased work of breathing  Abdomen: Soft, nontender, nondistended, positive bowel sounds  Musculoskeletal: No clubbing or cyanosis, trace edema   CNS: Alert and oriented x2. No focal deficits.  Data Reviewed: Basic Metabolic Panel:  Recent Labs Lab 04/04/14 0428 04/04/14 0430 04/04/14 1600 04/05/14 0500 04/06/14 0510 04/09/14 1030  NA 137  --  135* 135* 139 142  K 4.2  --  4.4 4.3 4.0 3.9  CL 103  --  101 101 105 104  CO2 25  --  25 26 25 25   GLUCOSE 101*  --  91 100* 89 114*  BUN 9  --  8 7 11 22   CREATININE 2.08*  --  1.94* 1.93* 2.87* 4.18*  CALCIUM 7.4*  --  7.5* 7.6* 7.7* 8.1*  MG  --  2.3  --  2.4  --   --   PHOS 2.3  --  2.3  2.3  --  3.6   Liver Function Tests:  Recent Labs Lab 04/04/14 0428 04/04/14 1600 04/05/14 0500 04/09/14 1030 04/10/14 0414  ALBUMIN 2.1* 2.1* 2.1* 2.7* 2.6*   No results found for this basename: LIPASE, AMYLASE,  in the last 168 hours No results found for this basename: AMMONIA,  in the last 168 hours CBC:  Recent Labs Lab 04/05/14 0500 04/06/14 0510 04/08/14 1209 04/09/14 1030 04/10/14 0414  WBC 5.1 8.6 6.4 7.4 7.4  NEUTROABS 3.3  --   --   --   --   HGB 7.4* 7.6* 7.2* 9.0* 9.6*  HCT 23.6* 25.0* 22.4* 28.3* 31.0*  MCV 92.5 95.1 95.7 93.1 94.8  PLT 157 206 125* 74* 52*   Cardiac Enzymes: No results found for this basename: CKTOTAL, CKMB, CKMBINDEX, TROPONINI,  in the last 168 hours BNP (last 3 results)  Recent Labs  07/04/13 0640 02/11/14 0800 03/08/14 1224  PROBNP 6732.0* >70000.0* >70000.0*   CBG:  Recent Labs Lab 04/05/14 2016 04/06/14 0011 04/06/14 0411 04/06/14 1210 04/07/14 1128  GLUCAP 94 102* 90 86 92  No results found for this or any previous visit (from the past 240 hour(s)).   Studies: No results found.  Scheduled Meds: . amLODipine  10 mg Oral Daily  . carvedilol  25 mg Oral BID WC  . cloNIDine  0.3 mg Oral TID  . darbepoetin (ARANESP) injection - DIALYSIS  200 mcg Intravenous Q Thu-HD  . feeding supplement (ENSURE)  1 Container Oral BID  . hydrALAZINE  100 mg Oral 3 times per day  . isosorbide mononitrate  30 mg Oral Daily  . levETIRAcetam  500 mg Oral BID  . lisinopril  10 mg Oral BID  . multivitamin  1 tablet Oral QHS  . phenytoin  100 mg Oral TID   Continuous Infusions:   Principal Problem:   ICH (intracerebral hemorrhage) Active Problems:   Uncontrolled hypertension   Anemia in chronic kidney disease   Cardiomyopathy EF 25-30%- etiol undetermined- EF varies- 20% Aug 2014, 40% Nov 2014, 50% March 2015   ESRD- HD since April 2015   Acute respiratory failure with hypoxia   Noncompliance with medication regimen in past-  mult adm for uncontrolled HTN   Convulsions/seizures   Encephalopathy    Time spent: 25 minutes    Modena Jansky, MD, FACP, [facility]. [facility] Pager [phone]  If 7PM-7AM, please contact night-coverage [email or web address] Password [email or web address] 04/10/2014, 3:52 PM  04/10/2014, 3:52 PM  LOS: 12 days

## 2014-04-10 NOTE — Progress Notes (Signed)
MEDICATION RELATED CONSULT NOTE - Follow-Up   Pharmacy Consult for phenytoin Indication: seizures  Allergies  Allergen Reactions  . Amoxicillin Hives    Patient Measurements: Height: 5\' 10"  (177.8 cm) Weight: 148 lb 5.9 oz (67.3 kg) IBW/kg (Calculated) : 73   Vital Signs: Temp: 98.4 F (36.9 C) (05/23 0830) Temp src: Oral (05/23 0830) BP: 187/114 mmHg (05/23 0830) Pulse Rate: 79 (05/23 0830) Intake/Output from previous day: 05/22 0701 - 05/23 0700 In: 360 [P.O.:360] Out: 4000  Intake/Output from this shift: Total I/O In: 480 [P.O.:360; Other:120] Out: 600 [Urine:600]  Labs:  Recent Labs  04/08/14 1209 04/09/14 1030 04/10/14 0414  WBC 6.4 7.4 7.4  HGB 7.2* 9.0* 9.6*  HCT 22.4* 28.3* 31.0*  PLT 125* 74* 52*  CREATININE  --  4.18*  --   PHOS  --  3.6  --   ALBUMIN  --  2.7* 2.6*   Estimated Creatinine Clearance: 22.6 ml/min (by C-G formula based on Cr of 4.18).   Microbiology: Recent Results (from the past 720 hour(s))  URINE CULTURE     Status: None   Collection Time    03/29/14 10:51 PM      Result Value Ref Range Status   Specimen Description URINE, CATHETERIZED   Final   Special Requests NONE   Final   Culture  Setup Time     Final   Value: 03/29/2014 23:32     Performed at [facility] Count     Final   Value: NO GROWTH     Performed at [facility]   Culture     Final   Value: NO GROWTH     Performed at [facility]   Report Status 03/31/2014 FINAL   Final  CULTURE, BLOOD (ROUTINE X 2)     Status: None   Collection Time    03/29/14 10:55 PM      Result Value Ref Range Status   Specimen Description BLOOD ARM RIGHT   Final   Special Requests BOTTLES DRAWN AEROBIC AND ANAEROBIC 5CC EA   Final   Culture  Setup Time     Final   Value: 03/30/2014 04:10     Performed at [facility]   Culture     Final   Value: NO GROWTH 5 DAYS     Performed at [facility]   Report Status 04/05/2014 FINAL    Final  CULTURE, BLOOD (ROUTINE X 2)     Status: None   Collection Time    03/29/14 11:03 PM      Result Value Ref Range Status   Specimen Description BLOOD HAND LEFT   Final   Special Requests BOTTLES DRAWN AEROBIC ONLY [facility]   Final   Culture  Setup Time     Final   Value: 03/30/2014 04:10     Performed at [facility]   Culture     Final   Value: NO GROWTH 5 DAYS     Performed at [facility]   Report Status 04/05/2014 FINAL   Final  MRSA PCR SCREENING     Status: None   Collection Time    03/30/14  4:20 AM      Result Value Ref Range Status   MRSA by PCR NEGATIVE  NEGATIVE Final   Comment:            The GeneXpert MRSA Assay (FDA     approved for NASAL specimens  only), is one component of a     comprehensive MRSA colonization     surveillance program. It is not     intended to diagnose MRSA     infection nor to guide or     monitor treatment for     MRSA infections.  CULTURE, RESPIRATORY (NON-EXPECTORATED)     Status: None   Collection Time    03/30/14  4:45 AM      Result Value Ref Range Status   Specimen Description TRACHEAL ASPIRATE   Final   Special Requests NONE   Final   Gram Stain     Final   Value: MODERATE WBC PRESENT,BOTH PMN AND MONONUCLEAR     NO SQUAMOUS EPITHELIAL CELLS SEEN     ABUNDANT GRAM POSITIVE COCCI     IN PAIRS     [facility]formed at [facility]   Culture     Final   Value: Non-Pathogenic Oropharyngeal-type Flora Isolated.     Performed at [facility]   Report Status 04/01/2014 FINAL   Final   Assessment: [age] YOM originally admitted to ICU with seizures and ICH. He was started on phenytoin on 5/16 by neurology who has now signed off. Levels obtained on 5/18 revealed a corrected phenytoin level of 13.2mg /L and free (unbound) phenytoin level of 0.9 mg/L which is slightly low. However, these levels were not drawn at steady state of phenytoin and therefore are difficult to assess.  Phenytoin level on 5/23 was 3.0  which corrects to a subtherapeutic level of 8.3mg /L. Patient has missed a couple of doses during the passed couple of days.   Goal of Therapy:  Total corrected phenytoin level 10-20mg /L  Plan:  - Change phenytoin ER to 200mg  BID - Obtain phenytoin level in 5-7 days to reassess  Christiane HaJonathan A. Lenon AhmadiBinz, PharmD Clinical Pharmacist - Resident Pager: [phone] Pharmacy: [phone] 04/10/2014 4:43 PM

## 2014-04-10 NOTE — Progress Notes (Addendum)
Physical Therapy Treatment Patient Details Name: Matthew Beard MRN: [id number] DOB: 01-Jun-1976 Today's Date: 04/10/2014    History of Present Illness  Pt. Admitted following a witnessed seizure , intubated from 5/11 to 5/15.  CT of brain indicates focus of parenchymal hemorrhage at the junction of L temporal and L parietal lobes.  Pt. With h/o CKD IV  on HD, HTN, ischemic stroke with no residual deficits.      PT Comments    Pt. With flat affect and needed increased time to process directions, and sometimes needed repeat of directions.  Able to cooperate  throughout PT session.  Able to tolerate walking short distance with RW but needed seated rest.  His sister in law, Paula Compton, present and confirms that pt. Will be living with pt's brother and her and that he will indeed have 24 hour care.  He is a great candidate for CIR level rehab.    Follow Up Recommendations  CIR     Equipment Recommendations  Other (comment) (TBD)    Recommendations for Other Services OT consult;Rehab consult     Precautions / Restrictions Precautions Precautions: Fall Restrictions Weight Bearing Restrictions: No Other Position/Activity Restrictions: Full Supervision for POs    Mobility  Bed Mobility Overal bed mobility: Needs Assistance Bed Mobility: Supine to Sit     Supine to sit: Mod assist     General bed mobility comments: mod assist to roll to right side with verbal and tactile cues to use railing to assist self.  Min assist to transition from side to sitting.  Transfers Overall transfer level: Needs assistance Equipment used: Rolling walker (2 wheeled) Transfers: Sit to/from Stand Sit to Stand: Min assist;+2 physical assistance;+2 safety/equipment         General transfer comment: Initial sit to stand from bed required +2 min. physical assist; subsequent trial from recliner required min of one and verbal cues for hand placement and to power up  Ambulation/Gait Ambulation/Gait  assistance: Min assist;+2 safety/equipment Ambulation Distance (Feet): 60 Feet (30' x 2) Assistive device: Rolling walker (2 wheeled) Gait Pattern/deviations: Step-through pattern;Decreased stride length;Narrow base of support Gait velocity: decreased Gait velocity interpretation: Below normal speed for age/gender General Gait Details: Pt. needed vc'ing to manage pushing and redirection RW.  Cues to  scan ahead of him and to side to side for object avoidance.  He tends to keep head flexed and does not scan without prompting.  Needed directional cues..  Pt. fatigues early and needed seated rest before return to room.   Stairs            Wheelchair Mobility    Modified Rankin (Stroke Patients Only) Modified Rankin (Stroke Patients Only) Pre-Morbid Rankin Score: Moderately severe disability Modified Rankin: Severe disability     Balance Overall balance assessment: Needs assistance Sitting-balance support: Bilateral upper extremity supported;Feet supported Sitting balance-Leahy Scale: Fair     Standing balance support: Bilateral upper extremity supported;During functional activity Standing balance-Leahy Scale: Poor Standing balance comment: needs use of RW to stabilize in standing                    Cognition Arousal/Alertness: Awake/alert Behavior During Therapy: WFL for tasks assessed/performed Overall Cognitive Status: Impaired/Different from baseline Area of Impairment: Following commands;Safety/judgement;Problem solving;Memory     Memory: Decreased recall of precautions Following Commands: Follows one step commands with increased time Safety/Judgement: Decreased awareness of safety;Decreased awareness of deficits   Problem Solving: Slow processing;Decreased initiation      Exercises  General Comments        Pertinent Vitals/Pain See vitals tab No pain, no distress noted    Home Living                      Prior Function             PT Goals (current goals can now be found in the care plan section) Progress towards PT goals: Progressing toward goals    Frequency  Min 4X/week    PT Plan Current plan remains appropriate    Co-evaluation             End of Session Equipment Utilized During Treatment: Gait belt Activity Tolerance: Patient tolerated treatment well Patient left: in chair;with call bell/phone within reach;with chair alarm set;with family/visitor present     Time: [id number] PT Time Calculation (min): 23 min  Charges:  $Gait Training: 23-37 mins                    G CodesFerman Hamming:      Shelitha Magley B Sahasra Belue 04/10/2014, 9:32 AM Weldon PickingSusan Jennifermarie Franzen PT Acute Rehab Services [phone] Beeper [phone]

## 2014-04-11 LAB — CBC
HEMATOCRIT: 30.9 % — AB (ref 39.0–52.0)
HEMOGLOBIN: 9.9 g/dL — AB (ref 13.0–17.0)
MCH: 30 pg (ref 26.0–34.0)
MCHC: 32 g/dL (ref 30.0–36.0)
MCV: 93.6 fL (ref 78.0–100.0)
Platelets: 95 10*3/uL — ABNORMAL LOW (ref 150–400)
RBC: 3.3 MIL/uL — AB (ref 4.22–5.81)
RDW: 18.2 % — ABNORMAL HIGH (ref 11.5–15.5)
WBC: 7.4 10*3/uL (ref 4.0–10.5)

## 2014-04-11 NOTE — Progress Notes (Signed)
Subjective:   No complaints. Oriented to person and place but doesn't know why he is in the hospital  Admissions:  Apr 2009-- chest pain, ^BP did not fill Rx's for Lotrel and HTCZ. W/U negative, CT chest neg for PE, normal LV fxn. Penile ulcer seen by urology, thought to be infectious, rx with doxy 100 bid x 3 wks  Mar 2010-- HTN urgency, medical noncompliance due to loss of insurance, dental infection, acute bronchitis  Sept 2010-- malignant HTN, acute pericarditis, CAP, anemia, tobacco use, severe LVH > d/c'd on minoxidil 5 bid + HCTZ, acei, BB and norvasc. MRA abd no RAS  Mar 2013-- HTN crisis, BP 235/157 in ED, severe HA. Rx cardene drip then oral meds. At d/c BP was 160/120 and HA's were resolved. New dx of kidney failure  Aug 2014-- presented to ED with one month hx of progressive SOB, PND and orthopnea. In ED BP was very high, pt admitted to medication noncompliance. Rx'd w cardene drip and BP improved. ECHO w 20% LVEF. Had +ANA a:1280 speckled, ^RF. Repeat ordered. Urine and plasma metanephrines and normetanephrines were elevated and abd CT was done which showed no findings of adrenal mass. Diuresed with IV lasix. D/C"d home.  Oct 2014-- pt presented with epistaxis and markedly elevated BP. Hx of noncompliance w medications due to financial difficulty. Treated w IV meds for BP then transitioned to PO meds. Counseled regarding compliance. CKD stage IV. Progressive.  Dec 2014-- L wrist Cimino AVF  Mar 2015-- acute on CKD IV, uncontrolled HTN and pulm edema with SOB. Diuresed with IV lasix, d/c'd on high dose po lasix and po zaroxlyn. EF 20% by echo. No HD yet, stage V CKD.  April 2015-- pt presented with SOB, DOE, not getting around the house well, leg edema up to the waist. Pt admitted and started on HD, edema improved, BP improved with meds and HD. D/C BP meds were norvasc, hydralzaine,, Coreg and Catapres. ^LFTs felt due to congestion from acute CHF exacerbation. D/C"d home on HD at OP tiw.    Objective Filed Vitals:   04/10/14 1653 04/10/14 1928 04/10/14 2111 04/11/14 0423  BP:  130/81 138/98 170/114  Pulse:  76 75 73  Temp:  98.5 F (36.9 C) 99 F (37.2 C) 98.5 F (36.9 C)  TempSrc:  Oral Oral Oral  Resp:  20 18 16   Height:      Weight: 68.8 kg (151 lb 10.8 oz)  72.3 kg (159 lb 6.3 oz)   SpO2:  98% 100% 98%   Physical Exam General: alert, on HD. No acute distress Heart: RRR Lungs: CTA, unlabored Abdomen: soft, nontender +BS Extremities: trace to +1 LE edema Dialysis Access: L AVF patent on HD  HD: MWF [facility]  4h 79kg 2/2.25 Bath 300/800 Heparin none due to bleed (was 8000) LUA AVF  Hectorol 4 Aranesp 100 Q Wed Venofer 100/hd thru 5/20  Last tsat 12%    Assessment:  1 Acute CVA - left temporal lobe, hemorrhagic 2 HTN uncontrolled- severe and major issue for this patient, his BP is finally now coming down after removing 10-15 kg of extra fluid which he probably carries all the time due to noncompliance w fluid restriction 3 ESRD on HD MWF, extra tx yesterday for excess vol bp control. HD now, uf goal 4500. 4 Anemia Hb 9, tranfused 1u prbc 5/21, on aranesp 200/wk Thursday 5 HPTH Ca+ 8.1. Phos 3.6 renagel on hold 6 HTN/ vol excess-  5 BP meds but vol control  is main issue , now improving 7 Nutrition- alb 2.7 dsy diet. multivit 8 NICM EF 25-30% 8 Dispo- to inpatient rehab    Vinson MoselleRob Kristeena Meineke MD pager [phone]    cell [phone] 04/10/2014, 11:14 AM    Additional Objective Labs: Basic Metabolic Panel:  Recent Labs Lab 04/04/14 1600 04/05/14 0500 04/06/14 0510 04/09/14 1030  NA 135* 135* 139 142  K 4.4 4.3 4.0 3.9  CL 101 101 105 104  CO2 25 26 25 25   GLUCOSE 91 100* 89 114*  BUN 8 7 11 22   CREATININE 1.94* 1.93* 2.87* 4.18*  CALCIUM 7.5* 7.6* 7.7* 8.1*  PHOS 2.3 2.3  --  3.6   Liver Function Tests:  Recent Labs Lab 04/05/14 0500 04/09/14 1030 04/10/14 0414  ALBUMIN 2.1* 2.7* 2.6*   No results found for this basename: LIPASE, AMYLASE,   in the last 168 hours CBC:  Recent Labs Lab 04/05/14 0500 04/06/14 0510 04/08/14 1209 04/09/14 1030 04/10/14 0414  WBC 5.1 8.6 6.4 7.4 7.4  NEUTROABS 3.3  --   --   --   --   HGB 7.4* 7.6* 7.2* 9.0* 9.6*  HCT 23.6* 25.0* 22.4* 28.3* 31.0*  MCV 92.5 95.1 95.7 93.1 94.8  PLT 157 206 125* 74* 52*   Blood Culture    Component Value Date/Time   SDES TRACHEAL ASPIRATE 03/30/2014 0445   SPECREQUEST NONE 03/30/2014 0445   CULT  Value: Non-Pathogenic Oropharyngeal-type Flora Isolated. [facility]formed at [facility] 03/30/2014 0445   REPTSTATUS 04/01/2014 FINAL 03/30/2014 0445    Cardiac Enzymes: No results found for this basename: CKTOTAL, CKMB, CKMBINDEX, TROPONINI,  in the last 168 hours CBG:  Recent Labs Lab 04/05/14 2016 04/06/14 0011 04/06/14 0411 04/06/14 1210 04/07/14 1128  GLUCAP 94 102* 90 86 92   Iron Studies: No results found for this basename: IRON, TIBC, TRANSFERRIN, FERRITIN,  in the last 72 hours @lablastinr3 @ Studies/Results: No results found. Medications:   . amLODipine  10 mg Oral Daily  . carvedilol  25 mg Oral BID WC  . cloNIDine  0.3 mg Oral TID  . darbepoetin (ARANESP) injection - DIALYSIS  200 mcg Intravenous Q Thu-HD  . feeding supplement (ENSURE)  1 Container Oral BID  . hydrALAZINE  100 mg Oral 3 times per day  . isosorbide mononitrate  30 mg Oral Daily  . levETIRAcetam  500 mg Oral BID  . lisinopril  10 mg Oral BID  . multivitamin  1 tablet Oral QHS  . phenytoin  200 mg Oral BID

## 2014-04-11 NOTE — Progress Notes (Addendum)
Subjective:   Up in chair  Objective Filed Vitals:   04/10/14 1653 04/10/14 1928 04/10/14 2111 04/11/14 0423  BP:  130/81 138/98 170/114  Pulse:  76 75 73  Temp:  98.5 F (36.9 C) 99 F (37.2 C) 98.5 F (36.9 C)  TempSrc:  Oral Oral Oral  Resp:  20 18 16   Height:      Weight: 68.8 kg (151 lb 10.8 oz)  72.3 kg (159 lb 6.3 oz)   SpO2:  98% 100% 98%   Physical Exam General: alert, on HD. No acute distress Heart: RRR Lungs: CTA, unlabored Abdomen: soft, nontender +BS Extremities: trace to +1 LE edema Dialysis Access: L AVF patent on HD  HD: MWF [facility]  4h 79kg 2/2.25 Bath 300/800 Heparin none due to bleed (was 8000) LUA AVF  Hectorol 4 Aranesp 100 Q Wed Venofer 100/hd thru 5/20  Last tsat 12%    Assessment:  1 Acute CVA - left temporal lobe, hemorrhagic 2 HTN/ vol excess-  BP finally improving from severe HTN to moderate HTN with volume down 13 kg from admission; also on 5 BP meds but I think volume control is primary issue. Lowest weight now is 67 kg and probably can be lowered even further 3 ESRD on HD 4 Anemia Hb 9, s/p1u RBC transfusion 5.21. on aranesp 200/wk Thursday 5 HPTH Ca+ 8.1. Phos 3.6 renagel on hold 6 nutrition- alb 2.7 dsy diet. multivit 7 NICM EF 25-30% 8 Dispo- to inpatient rehab   Plan- HD Monday, goal 4kg   Vinson Moselle MD (pgr) [phone]    ([phone] 04/11/2014, 9:03 AM        Additional Objective Labs: Basic Metabolic Panel:  Recent Labs Lab 04/04/14 1600 04/05/14 0500 04/06/14 0510 04/09/14 1030  NA 135* 135* 139 142  K 4.4 4.3 4.0 3.9  CL 101 101 105 104  CO2 25 26 25 25   GLUCOSE 91 100* 89 114*  BUN 8 7 11 22   CREATININE 1.94* 1.93* 2.87* 4.18*  CALCIUM 7.5* 7.6* 7.7* 8.1*  PHOS 2.3 2.3  --  3.6   Liver Function Tests:  Recent Labs Lab 04/05/14 0500 04/09/14 1030 04/10/14 0414  ALBUMIN 2.1* 2.7* 2.6*   No results found for this basename: LIPASE, AMYLASE,  in the last 168 hours CBC:  Recent Labs Lab  04/05/14 0500 04/06/14 0510 04/08/14 1209 04/09/14 1030 04/10/14 0414 04/11/14 0716  WBC 5.1 8.6 6.4 7.4 7.4 7.4  NEUTROABS 3.3  --   --   --   --   --   HGB 7.4* 7.6* 7.2* 9.0* 9.6* 9.9*  HCT 23.6* 25.0* 22.4* 28.3* 31.0* 30.9*  MCV 92.5 95.1 95.7 93.1 94.8 93.6  PLT 157 206 125* 74* 52* 95*   Blood Culture    Component Value Date/Time   SDES TRACHEAL ASPIRATE 03/30/2014 0445   SPECREQUEST NONE 03/30/2014 0445   CULT  Value: Non-Pathogenic Oropharyngeal-type Flora Isolated. Performed at [facility] 03/30/2014 0445   REPTSTATUS 04/01/2014 FINAL 03/30/2014 0445    Cardiac Enzymes: No results found for this basename: CKTOTAL, CKMB, CKMBINDEX, TROPONINI,  in the last 168 hours CBG:  Recent Labs Lab 04/05/14 2016 04/06/14 0011 04/06/14 0411 04/06/14 1210 04/07/14 1128  GLUCAP 94 102* 90 86 92   Iron Studies: No results found for this basename: IRON, TIBC, TRANSFERRIN, FERRITIN,  in the last 72 hours @lablastinr3 @ Studies/Results: No results found. Medications:   . amLODipine  10 mg Oral Daily  . carvedilol  25 mg  Oral BID WC  . cloNIDine  0.3 mg Oral TID  . darbepoetin (ARANESP) injection - DIALYSIS  200 mcg Intravenous Q Thu-HD  . feeding supplement (ENSURE)  1 Container Oral BID  . hydrALAZINE  100 mg Oral 3 times per day  . isosorbide mononitrate  30 mg Oral Daily  . levETIRAcetam  500 mg Oral BID  . lisinopril  10 mg Oral BID  . multivitamin  1 tablet Oral QHS  . phenytoin  200 mg Oral BID

## 2014-04-11 NOTE — Progress Notes (Signed)
[facility] PROGRESS NOTE  Matthew Beard LOV:[id number] DOB: 1976/06/29 DOA: 03/29/2014 PCP: [name], MD  Interim summary:  Patient is a 38 year old African American male past medical history of end-stage renal disease with noncompliant with dialysis, hypertension, nonischemic cardiomyopathy and secondary congestive heart failure who was brought into the emergency room on 5/11 after he was witnessed at home to fall backwards and then have was reported seizure-like activity. In the emergency room he is found to have blood pressures as high as 249/144 and a head CT revealed a small focal hemorrhage at the junction of the left parietal and temporal lobes. Patient was intubated for protection of airway and as well as agitation. He had been just discharged from the hospital 2 weeks prior for volume overload after he was noncompliance with dialysis at that time.  In the ICU, neurology consulted. Recommendation was for systolic blood pressure between 120-160 which proved to be difficult requiring multiple medication drips. He was put on Keppra for seizures prevention. MRI done 5/13 noted a 1.8 acute left temporal lobe bleed with multiple microinfarcts and bleed. Patient was suspected of having aspiration pneumonia and started on antibiotics which completed its course on 5/18. Echocardiogram done noted drop in ejection fraction from 50% down to 25% as well as global hypokinesis. Cardiology was consulted and felt the patient likely had hypertensive component out cardiomyopathy. Patient was continued on CVVH by neurology.   Sedation was discontinued but patient was not awake enough for extubation. Palliative care was consulted and met with family on 5/15. Family was able to understand poor long-term prognosis. Patient was kept as full code. He was able to be extubated on 5/16. At this point, CVVH was stopped and conventional hemodialysis was started on 5/18. He tolerated this well and transferred to the  renal 4 on 5/19 and hospitalists assumed care on 5/20. He appears to be more awake and answers questions slowly, but appropriately.    Assessment/Plan: Principal Problem:   ICH (intracerebral hemorrhage)/acute hemorrhagic CVA: Stabilized.  Dysphagia: Seen by speech therapy. Current recommendation is for dysphasia 3 with nectar thick liquids.  Active Problems:   Uncontrolled hypertension: Blood pressure chronically severely elevated. Continue carvedilol, amlodipine, hydralazine, Imdur and clonidine. Lisinopril added today and if blood pressure is not better then nephrology plans to add minoxidil. Does not seem volume overloaded on exam. Blood pressure seems better. Continue current Mx.    Anemia in chronic kidney disease: Status post 1 unit PRBC transfusion on 5/21. Hemoglobin stable. Continue Aranesp.  Thrombocytopenia: Continued gradual decline in platelet count 206 > 125 > 74 > 52. Unclear etiology. Does not seem to have received heparin since admission. Improved. Monitor.    Cardiomyopathy EF 25-30%- etiol undetermined- EF varies- 20% Aug 2014, 40% Nov 2014, 50% March 2015    ESRD- HD since April 2015: Continued on dialysis. Nephrology following.    Acute respiratory failure with hypoxia: Resolved.  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    Encephalopathy: Slowly improving. Family states 60% better.  Aspiration pneumonia: Treated    Code Status: Full code  Family Communication: Discussed with patient's brother Elberta Fortis and his wife at bedside on 5/24. They indicate that they will be able to provide 24/7 supervision and assistance at home after his discharge from [facility].  Disposition Plan: Appropriate for CIR- will discuss on 5/25  Consultants:  Critical care  Nephrology  Neurology  Cardiology  Palliative care  Procedures:  ETT 5/12 >>>5/15  Rt IJ Trialysis  cath 03/30/14 >> 5/19  Echocardiogram done 5/12: Decreased ejection fraction of 17-51%, diastolic flattening, moderate dilatation of right and left atrium and ventricles. Severe LVH   Antibiotics:  Vanc 5/11 >>5/11   Maxipime 5/11 >>>5/12   Imipenem 5/12 >>5/13   Levaquin 5/13 >> 5/18   HPI/Subjective: Patient denies complaints.   Objective: Filed Vitals:   04/11/14 0948  BP: 145/85  Pulse: 72  Temp: 97.9 F (36.6 C)  Resp: 18    Intake/Output Summary (Last 24 hours) at 04/11/14 1317 Last data filed at 04/11/14 0851  Gross per 24 hour  Intake    360 ml  Output    575 ml  Net   -215 ml   Filed Weights   04/10/14 1653 04/10/14 2111 04/11/14 0851  Weight: 68.8 kg (151 lb 10.8 oz) 72.3 kg (159 lb 6.3 oz) 68.4 kg (150 lb 12.7 oz)    Exam:   General:  Alert and oriented x2, no acute distress. Flat affect. Sitting comfortably on chair.  Cardiovascular: Regular rate and rhythm, W2-H8, 2/6 systolic ejection murmur  Respiratory: Clear to auscultation bilaterally. No increased work of breathing  Abdomen: Soft, nontender, nondistended, positive bowel sounds  Musculoskeletal: No clubbing or cyanosis, trace edema   CNS: Alert and oriented x2. No focal deficits.  Data Reviewed: Basic Metabolic Panel:  Recent Labs Lab 04/04/14 1600 04/05/14 0500 04/06/14 0510 04/09/14 1030  NA 135* 135* 139 142  K 4.4 4.3 4.0 3.9  CL 101 101 105 104  CO2 25 26 25 25   GLUCOSE 91 100* 89 114*  BUN 8 7 11 22   CREATININE 1.94* 1.93* 2.87* 4.18*  CALCIUM 7.5* 7.6* 7.7* 8.1*  MG  --  2.4  --   --   PHOS 2.3 2.3  --  3.6   Liver Function Tests:  Recent Labs Lab 04/04/14 1600 04/05/14 0500 04/09/14 1030 04/10/14 0414  ALBUMIN 2.1* 2.1* 2.7* 2.6*   No results found for this basename: LIPASE, AMYLASE,  in the last 168 hours No results found for this basename: AMMONIA,  in the last 168 hours CBC:  Recent Labs Lab 04/05/14 0500 04/06/14 0510 04/08/14 1209  04/09/14 1030 04/10/14 0414 04/11/14 0716  WBC 5.1 8.6 6.4 7.4 7.4 7.4  NEUTROABS 3.3  --   --   --   --   --   HGB 7.4* 7.6* 7.2* 9.0* 9.6* 9.9*  HCT 23.6* 25.0* 22.4* 28.3* 31.0* 30.9*  MCV 92.5 95.1 95.7 93.1 94.8 93.6  PLT 157 206 125* 74* 52* 95*   Cardiac Enzymes: No results found for this basename: CKTOTAL, CKMB, CKMBINDEX, TROPONINI,  in the last 168 hours BNP (last 3 results)  Recent Labs  07/04/13 0640 02/11/14 0800 03/08/14 1224  PROBNP 6732.0* >70000.0* >70000.0*   CBG:  Recent Labs Lab 04/05/14 2016 04/06/14 0011 04/06/14 0411 04/06/14 1210 04/07/14 1128  GLUCAP 94 102* 90 86 92    No results found for this or any previous visit (from the past 240 hour(s)).   Studies: No results found.  Scheduled Meds: . amLODipine  10 mg Oral Daily  . carvedilol  25 mg Oral BID WC  . cloNIDine  0.3 mg Oral TID  . darbepoetin (ARANESP) injection - DIALYSIS  200 mcg Intravenous Q Thu-HD  . feeding supplement (ENSURE)  1 Container Oral BID  . hydrALAZINE  100 mg Oral 3 times per day  .  isosorbide mononitrate  30 mg Oral Daily  . levETIRAcetam  500 mg Oral BID  . lisinopril  10 mg Oral BID  . multivitamin  1 tablet Oral QHS  . phenytoin  200 mg Oral BID   Continuous Infusions:   Principal Problem:   ICH (intracerebral hemorrhage) Active Problems:   Uncontrolled hypertension   Anemia in chronic kidney disease   Cardiomyopathy EF 25-30%- etiol undetermined- EF varies- 20% Aug 2014, 40% Nov 2014, 50% March 2015   ESRD- HD since April 2015   Acute respiratory failure with hypoxia   Noncompliance with medication regimen in past- mult adm for uncontrolled HTN   Convulsions/seizures   Encephalopathy    Time spent: 25 minutes    Modena Jansky, MD, FACP, [facility]. [facility] Pager [phone]  If 7PM-7AM, please contact night-coverage [email or web address] Password [email or web address] 04/11/2014, 1:17 PM  04/11/2014, 1:17 PM  LOS: 13 days

## 2014-04-12 NOTE — Progress Notes (Signed)
Subjective:   Complains of pain in arm, is not tolerating being cannulated for HD.  Objective Filed Vitals:   04/11/14 2051 04/12/14 0158 04/12/14 0532 04/12/14 0840  BP: 155/109 156/104 174/101 203/122  Pulse: 76 70 65 73  Temp: 98.3 F (36.8 C)  97.9 F (36.6 C) 98.3 F (36.8 C)  TempSrc: Oral  Oral Oral  Resp: 18  18 18   Height:      Weight:      SpO2: 100% 97% 100% 100%   Physical Exam General: alert, resting in bed in no acute distress then flailing and screaming when cannulated. Heart: RRR, tachy Lungs: CTA, unlabored Abdomen: soft, nontender +BS Extremities: Trace LE edema Dialysis Access: L AVF patent  HD: MWF [facility]  4h 79kg 2/2.25 Bath 300/800 Heparin none due to bleed (was 8000) LUA AVF  Hectorol 4 Aranesp 100 Q Wed Venofer 100/hd thru 5/20  Last tsat 12%   Assessment:  1 Acute CVA - left temporal lobe, hemorrhagic  2 HTN/ vol excess- BP finally improving from severe HTN to moderate HTN with volume down 13 kg from admission; also on 5 BP meds but I think volume control is primary issue. Lowest weight now is 67 kg and probably can be lowered even further  3 ESRD MWF @ [facility], is not tolerating cannulation today- attempt HD tomorrow 4 Anemia Hb 9.9, s/p1u RBC transfusion 5/21. on aranesp 200/wk Thursday  5 HPTH Ca+ 8.1. Phos 3.6 renagel on hold  6 nutrition- alb 2.6 dsy diet. Multivit, supplements 7 NICM EF 25-30%  8 Dispo- to inpatient rehab    Jetty Duhamel, NP [facility] [phone] 04/12/2014,1:19 PM  LOS: 14 days    Additional Objective Labs: Basic Metabolic Panel:  Recent Labs Lab 04/06/14 0510 04/09/14 1030  NA 139 142  K 4.0 3.9  CL 105 104  CO2 25 25  GLUCOSE 89 114*  BUN 11 22  CREATININE 2.87* 4.18*  CALCIUM 7.7* 8.1*  PHOS  --  3.6   Liver Function Tests:  Recent Labs Lab 04/09/14 1030 04/10/14 0414  ALBUMIN 2.7* 2.6*   No results found for this basename: LIPASE, AMYLASE,  in the last 168  hours CBC:  Recent Labs Lab 04/06/14 0510 04/08/14 1209 04/09/14 1030 04/10/14 0414 04/11/14 0716  WBC 8.6 6.4 7.4 7.4 7.4  HGB 7.6* 7.2* 9.0* 9.6* 9.9*  HCT 25.0* 22.4* 28.3* 31.0* 30.9*  MCV 95.1 95.7 93.1 94.8 93.6  PLT 206 125* 74* 52* 95*   Blood Culture    Component Value Date/Time   SDES TRACHEAL ASPIRATE 03/30/2014 0445   SPECREQUEST NONE 03/30/2014 0445   CULT  Value: Non-Pathogenic Oropharyngeal-type Flora Isolated. Performed at [facility] 03/30/2014 0445   REPTSTATUS 04/01/2014 FINAL 03/30/2014 0445    Cardiac Enzymes: No results found for this basename: CKTOTAL, CKMB, CKMBINDEX, TROPONINI,  in the last 168 hours CBG:  Recent Labs Lab 04/05/14 2016 04/06/14 0011 04/06/14 0411 04/06/14 1210 04/07/14 1128  GLUCAP 94 102* 90 86 92   Iron Studies: No results found for this basename: IRON, TIBC, TRANSFERRIN, FERRITIN,  in the last 72 hours @lablastinr3 @ Studies/Results: No results found. Medications:   . amLODipine  10 mg Oral Daily  . carvedilol  25 mg Oral BID WC  . cloNIDine  0.3 mg Oral TID  . darbepoetin (ARANESP) injection - DIALYSIS  200 mcg Intravenous Q Thu-HD  . feeding supplement (ENSURE)  1 Container Oral BID  . hydrALAZINE  100 mg Oral 3  times per day  . isosorbide mononitrate  30 mg Oral Daily  . levETIRAcetam  500 mg Oral BID  . lisinopril  10 mg Oral BID  . multivitamin  1 tablet Oral QHS  . phenytoin  200 mg Oral BID

## 2014-04-12 NOTE — PMR Pre-admission (Signed)
PMR Admission Coordinator Pre-Admission Assessment  Patient: Matthew Beard is an 38 y.o., male MRN: [id number] DOB: 1976/09/30 Height: 5\' 10"  (177.8 cm) Weight: 63.7 kg (140 lb 6.9 oz)              Insurance Information HMO:     PPO:      PCP:      IPA:      80/20:      OTHER:  PRIMARY: Medicaid [place] Access      Policy#: [id number]      Subscriber: self CM Name:       Phone#:      Fax#:  Pre-Cert#: verified eligibility on 04-08-14       Employer: unemployed Benefits:  Phone #: [phone]     Name:  Eff. Date: 04-08-14 eligible     Deduct: none      Out of Pocket Max: none     Life Max: none CIR: covered      SNF: covered Outpatient: covered    Co-Pay: none Home Health: covered      Co-Pay:  none DME: covered     Co-Pay: none Providers: in network  Emergency Contact Information Contact Information   Name Relation Home Work Mobile   Birch TreeHenry,Earlene Aunt   [phone]   Dasch,Anthony Brother   [phone]   Arnoldo MoraleGause,Stephanie Significant other   [phone]   Fernicola,Carla Relative   [phone]     Current Medical History  Patient Admitting Diagnosis: parenchymal hemorrhage at the junction of the left temporal and left parietal lobe  History of Present Illness: Matthew Beard is a 38 y.o. right-handed male with history of end-stage renal disease with new start hemodialysis, nonischemic cardiomyopathy, hypertension, diastolic congestive heart failure history of chronic lacunar infarcts as well as medical noncompliance. Admitted 03/30/2014 after questionable syncopal episode with fall versus seizure.He did require intubation in the ED for agitation. He was loaded with Keppra as well as the addition of Dilantin. Cranial CT scan showed focus of parenchymal hemorrhage at the junction of the left temporal and left parietal lobe measuring 1.6 x 1.6 cm. CT angiogram head with no intracranial aneurysm identified no evidence of significant proximal stenosis. EEG consistent with generalized  nonspecific cerebral dysfunction. He was extubated and 04/02/2014. Renal service followup hemodialysis as directed with latest creatinine . Followup MRI 03/31/2014 with noted widespread micro-bleeds, chronic corpus callosum hemorrhagic infarct as well as multiple subcentimeter acute infarcts throughout the supratentorial region. Presently maintained on mechanical soft nectar thick liquids.  M.D. has requested physical medicine rehabilitation consult.  Past Medical History  Past Medical History  Diagnosis Date  . Hypertension   . CKD (chronic kidney disease), stage IV   . Normocytic anemia   . Pericardial effusion     Remote hx  . Cerebrovascular disease     Chronic lacunar infarcts  . NICM (nonischemic cardiomyopathy)   . CHF (congestive heart failure)   . Shortness of breath     Family History  family history includes Heart disease in his father and mother; Hypertension in his brother, father, and mother; Stroke in his father and mother.  Prior Rehab/Hospitalizations: none. Note pt has had medical noncompliance with dialysis.   Current Medications  Current facility-administered medications:0.9 %  sodium chloride infusion, 250 mL, Intravenous, PRN, Cyril Mourningakesh Alva V, MD;  0.9 %  sodium chloride infusion, 100 mL, Intravenous, PRN, Sheffield SliderMartha B. Bergman, PA-C;  0.9 %  sodium chloride infusion, 100 mL, Intravenous, PRN, Sheffield SliderMartha B. Bergman, PA-C;  amLODipine (  NORVASC) tablet 10 mg, 10 mg, Oral, Q2200, [name], PA-C, 10 mg at 04/20/14 2305 carvedilol (COREG) tablet 25 mg, 25 mg, Oral, BID WC, [name], PA-C, 25 mg at 04/20/14 2040;  chlorproMAZINE (THORAZINE) tablet 10 mg, 10 mg, Oral, TID PRN, Hollice Espy, MD, 10 mg at 04/07/14 1048;  cloNIDine (CATAPRES) tablet 0.3 mg, 0.3 mg, Oral, TID, [name], PA-C, 0.3 mg at 04/20/14 2041;  darbepoetin (ARANESP) injection 100 mcg, 100 mcg, Intravenous, Q Tue-HD, [name], PA-C, 100 mcg at 04/20/14 1652 diphenhydrAMINE  (BENADRYL) capsule 25 mg, 25 mg, Oral, Q6H PRN, Storm Frisk, MD, 25 mg at 04/06/14 0251;  doxercalciferol (HECTOROL) injection 2 mcg, 2 mcg, Intravenous, Q T,Th,Sa-HD, [name], PA-C, 2 mcg at 04/20/14 1653;  feeding supplement (ENSURE) (ENSURE) pudding 1 Container, 1 Container, Oral, BID, Hettie Holstein, RD, 1 Container at 04/19/14 0940 feeding supplement (NEPRO CARB STEADY) liquid 237 mL, 237 mL, Oral, Daily, Haynes Bast, RD, 237 mL at 04/20/14 1040;  feeding supplement (NEPRO CARB STEADY) liquid 237 mL, 237 mL, Oral, PRN, [name], PA-C;  fentaNYL (SUBLIMAZE) injection 25 mcg, 25 mcg, Intravenous, Q2H PRN, Merwyn Katos, MD, 25 mcg at 04/06/14 1936;  food thickener (THICK IT) powder, , Oral, PRN, Hollice Espy, MD furosemide (LASIX) tablet 80 mg, 80 mg, Oral, BID, [name], PA-C, 80 mg at 04/20/14 2042;  heparin injection 1,000 Units, 1,000 Units, Dialysis, PRN, [name], PA-C;  hydrALAZINE (APRESOLINE) injection 10 mg, 10 mg, Intravenous, Q4H PRN, Nelda Bucks, MD, 10 mg at 04/20/14 0531;  hydrALAZINE (APRESOLINE) tablet 100 mg, 100 mg, Oral, 3 times per day, [name], PA-C, 100 mg at 04/21/14 1610 isosorbide mononitrate (IMDUR) 24 hr tablet 30 mg, 30 mg, Oral, Daily, Hollice Espy, MD, 30 mg at 04/20/14 2041;  labetalol (NORMODYNE,TRANDATE) injection 20 mg, 20 mg, Intravenous, Q2H PRN, Storm Frisk, MD, 20 mg at 04/07/14 9604;  levETIRAcetam (KEPPRA) 100 MG/ML solution 500 mg, 500 mg, Oral, BID, Elease Etienne, MD, 500 mg at 04/20/14 2306 lidocaine (PF) (XYLOCAINE) 1 % injection 5 mL, 5 mL, Intradermal, PRN, [name], PA-C;  lidocaine-prilocaine (EMLA) cream 1 application, 1 application, Topical, PRN, [name], PA-C;  lidocaine-prilocaine (EMLA) cream, , Topical, Q dialysis, Irena Cords, MD;  lisinopril (PRINIVIL,ZESTRIL) tablet 20 mg, 20 mg, Oral, QHS, [name], PA-C minoxidil  (LONITEN) tablet 2.5 mg, 2.5 mg, Oral, BID, Maree Krabbe, MD, 2.5 mg at 04/20/14 2307;  multivitamin (RENA-VIT) tablet 1 tablet, 1 tablet, Oral, QHS, Derrill Kay, NP, 1 tablet at 04/20/14 2306;  pentafluoroprop-tetrafluoroeth (GEBAUERS) aerosol 1 application, 1 application, Topical, PRN, [name], PA-C;  phenytoin (DILANTIN) ER capsule 200 mg, 200 mg, Oral, BID, Mickey Farber, RPH, 200 mg at 04/20/14 2041 QUEtiapine (SEROQUEL) tablet 25 mg, 25 mg, Oral, BID, Diannia Ruder, MD, 25 mg at 04/20/14 2306;  sevelamer carbonate (RENVELA) tablet 800 mg, 800 mg, Oral, TID WC, [name], PA-C, 800 mg at 04/19/14 1802  Patients Current Diet: Dysphagia level 3, nectar thick  Precautions / Restrictions Precautions Precautions: Fall Precaution Comments: monitor BP-running high Restrictions Weight Bearing Restrictions: No Other Position/Activity Restrictions: Full Supervision for POs   Note: typical HD days: M-W-F (however, pt last received dialysis on Tues 6-2 and is scheduled next for Thursday 6-4). Pt received new R IJ catheter for dialysis as pt had compliance issues with previous fistula site.  Prior Activity Level Community (5-7x/wk): pt was independent prior to admit.   Home Assistive Devices / Equipment Home Assistive Devices/Equipment: None Home Equipment: Walker - 2 wheels;Wheelchair - manual  Prior Functional Level Prior Function Level of Independence: Needs assistance Gait / Transfers Assistance Needed: Not sure how much pt was walking; Pt reports he was independent with transfers to wheelchair PTA  Current Functional Level Cognition  Arousal/Alertness: Lethargic Overall Cognitive Status: Difficult to assess Current Attention Level: Sustained Orientation Level: Oriented to person;Disoriented to place;Disoriented to time;Disoriented to situation Following Commands: Follows one step commands with increased time Safety/Judgement: Decreased awareness of  safety;Decreased awareness of deficits General Comments: Able to follow commands and answer simple questions when given adequate time Attention: Focused Focused Attention: Impaired Focused Attention Impairment: Verbal basic;Functional basic Memory: Impaired Memory Impairment: Storage deficit;Decreased recall of new information Awareness: Impaired Awareness Impairment: Intellectual impairment Problem Solving: Impaired Problem Solving Impairment: Verbal basic;Functional basic Safety/Judgment: Impaired    Extremity Assessment (includes Sensation/Coordination)          ADLs  Overall ADL's : Needs assistance/impaired Eating/Feeding: Minimal assistance;Cueing for safety;Sitting;Cueing for compensatory techinques (assist for bite size) Grooming: Wash/dry face;Sitting;Moderate assistance;Standing Upper Body Bathing: Sitting;Cueing for sequencing;Minimal assitance Lower Body Bathing: Moderate assistance;Sit to/from stand;Cueing for safety;Cueing for sequencing Upper Body Dressing : Minimal assistance;Sitting Lower Body Dressing: Maximal assistance;Sitting/lateral leans Toilet Transfer: Minimal assistance;Regular Toilet;Grab bars General ADL Comments: pt non verbal most of session, required mutlimodal cues with increased time to initiate and complete tasks    Mobility  Overal bed mobility: Needs Assistance Bed Mobility: Supine to Sit Supine to sit: Mod assist Sit to supine: Total assist General bed mobility comments: pt up in recliner upon entering room    Transfers  Overall transfer level: Needs assistance Equipment used: Rolling walker (2 wheeled) Transfers: Sit to/from Stand Sit to Stand: Min assist Stand pivot transfers: Mod assist;Max assist General transfer comment: Able to power up; cues for safety, and needed handheld/hand over hand cues to reach back for armrests to control descent    Ambulation / Gait / Stairs / Wheelchair Mobility  Ambulation/Gait Ambulation/Gait  assistance: Min assist;Mod assist Ambulation Distance (Feet): 80 Feet Assistive device: Rolling walker (2 wheeled) Gait Pattern/deviations: Shuffle;Decreased stride length;Decreased step length - right;Decreased step length - left;Decreased stance time - right;Decreased stance time - left Gait velocity: fluctuating Gait velocity interpretation: <1.8 ft/sec, indicative of risk for recurrent falls General Gait Details: Short steps; Requiring mod assist at times for RW maneuvering, especially with turning; stopped to adjust socks/scratch foot, and able to do so with one hand on rW for steadiness, however did lose balance and required max assist to prevent fall    Posture / Balance Dynamic Sitting Balance Sitting balance - Comments: min guard assist at EOB for grooming.    Special needs/care consideration BiPAP/CPAP no CPM no  Continuous Drip IV no  Dialysis - yes        Days - typical days were M-W-F (see above comments: pt last received dialysis on Tues 6-2 and is now scheduled for Thur 6-4. HD has been contacted to arrange for later afternoon HD times to accommodate his therapy schedule).  Life Vest no  Oxygen no  Special Bed no  Trach Size no  Wound Vac (area) no       Skin - dry skin, especially on scalp per sister-in-law, L UE fistula site and new R IJ cath site  Bowel mgmt: last BM on 04-19-14 (incontinent) Bladder mgmt: currently with foley cath Diabetic mgmt no   Previous Home Environment Living Arrangements: Spouse/significant other Available Help at Discharge: Family;Available 24 hours/day Type of Home: House Home Layout: One level Home Access: Ramped entrance Additional Comments: pt's brother Ethelene Browns and sister in law Albin Felling will be able to provide 24-supervision in their home. Pt's fiancee is also very supportive and has a variable work schedule with his work as a Therapist, nutritional. They will be working as a team to coordinate his care needs. Sister-in-law  works in the school system and will be home all summer, available for 24 hr care.  Discharge Living Setting Plans for Discharge Living Setting: Apartment (plans to go to brother's apartment at DC and sister-in-law to be primary caregiver while fiance works) Type of Home at Discharge: Apartment Discharge Home Layout: One level Discharge Home Access: Level entry Does the patient have any problems obtaining your medications?: No  Social/Family/Support Systems Patient Roles: Partner Contact Information: brother Ethelene Browns and sister-in-law Albin Felling will be primary contact Anticipated Caregiver: sister in law, Albin Felling and fiance Judeth Cornfield (around her work schedule) Anticipated Industrial/product designer Information: see above Ability/Limitations of Caregiver: no limitations Caregiver Availability: 24/7 Discharge Plan Discussed with Primary Caregiver: Yes (discussed with pt's brother and sister-in-law on 5-25, w/ fiance on 5-22. Sister- in-law to update pt's fiance after discussion on 5-25. Had continued discussions with finace and brother on 04-19-14. Have updated pt's family on several other occasions. See previous epic notes.) Is Caregiver In Agreement with Plan?: Yes Does Caregiver/Family have Issues with Lodging/Transportation while Pt is in Rehab?: No  Goals/Additional Needs Patient/Family Goal for Rehab: supervision and minimal assist with PT, OT and SLP Expected length of stay: 18-24 days Cultural Considerations: none. pt is [employer] Dietary Needs: Dys 3, nectar thick, dialysis pt Equipment Needs: to be determined Special Service Needs: Dialysis M-W-F (was his typical schedule) but now to receive dialysis on Thursday 6-4. See above comments. HD has been contacted to arrange for later afternoon dialysis times to accommodate his therapy sessions. Pt/Family Agrees to Admission and willing to participate: Yes (spoke with pt's brother and sister in law on 5-25 and pt's fiance on several days) Program  Orientation Provided & Reviewed with Pt/Caregiver Including Roles  & Responsibilities: Yes   Decrease burden of Care through IP rehab admission: NA  Possible need for SNF placement upon discharge: not anticipated  Patient Condition: This patient's medical and functional status has changed since the consult dated: 04-08-14 in which the Rehabilitation Physician determined and documented that the patient's condition is appropriate for intensive rehabilitative care in an inpatient rehabilitation facility. See "History of Present Illness" (above) for medical update. Functional changes are: minimal to moderate assist to ambulate 80' and variable assist with self care skills from minimal to max assist. Patient's medical and functional status update has been discussed with the Rehabilitation physician and patient remains appropriate for inpatient rehabilitation. Will admit to inpatient rehab today.  Preadmission Screen Completed By:  Thane Edu, PT 04/21/2014 10:27 AM ______________________________________________________________________   Discussed status with Dr. Riley Kill on 04-21-14 at 1037 and received telephone approval for admission today.  Admission Coordinator:  Thane Edu, PT time 1037/Date 04-21-14

## 2014-04-12 NOTE — Progress Notes (Signed)
Hemodialysis- pt brought to unit. Sleeping but arousable. Agreed to dialysis. When cannulating pt screamed out and thrashed violently in bed. Pt is not safe to dialyze at this time. NP notified. Will send back to room and await further instruction.

## 2014-04-12 NOTE — Progress Notes (Signed)
[facility] PROGRESS NOTE  Matthew Beard KZS:[id number] DOB: 19-Sep-1976 DOA: 03/29/2014 PCP: [name], MD  Interim summary:  Patient is a 38 year old African American male past medical history of end-stage renal disease with noncompliant with dialysis, hypertension, nonischemic cardiomyopathy and secondary congestive heart failure who was brought into the emergency room on 5/11 after he was witnessed at home to fall backwards and then have was reported seizure-like activity. In the emergency room he is found to have blood pressures as high as 249/144 and a head CT revealed a small focal hemorrhage at the junction of the left parietal and temporal lobes. Patient was intubated for protection of airway and as well as agitation. He had been just discharged from the hospital 2 weeks prior for volume overload after he was noncompliance with dialysis at that time.  In the ICU, neurology consulted. Recommendation was for systolic blood pressure between 120-160 which proved to be difficult requiring multiple medication drips. He was put on Keppra for seizures prevention. MRI done 5/13 noted a 1.8 acute left temporal lobe bleed with multiple microinfarcts and bleed. Patient was suspected of having aspiration pneumonia and started on antibiotics which completed its course on 5/18. Echocardiogram done noted drop in ejection fraction from 50% down to 25% as well as global hypokinesis. Cardiology was consulted and felt the patient likely had hypertensive component out cardiomyopathy. Patient was continued on CVVH by neurology.   Sedation was discontinued but patient was not awake enough for extubation. Palliative care was consulted and met with family on 5/15. Family was able to understand poor long-term prognosis. Patient was kept as full code. He was able to be extubated on 5/16. At this point, CVVH was stopped and conventional hemodialysis was started on 5/18. He tolerated this well and transferred to the  renal 4 on 5/19 and hospitalists assumed care on 5/20.  MS continue to gradually improve. HTN control better on polypharmacy and volume removal across HD.  Assessment/Plan: Principal Problem:   ICH (intracerebral hemorrhage)/acute hemorrhagic CVA: Stabilized.  Dysphagia: Seen by speech therapy. Current recommendation is for dysphasia 3 with nectar thick liquids.  Active Problems:   Uncontrolled hypertension: Blood pressure chronically severely elevated. Continue carvedilol, amlodipine, hydralazine, Imdur ,clonidine & Lisinopril. Blood pressure control better on polypharmacy and volume removal across HD. Continue current Mx.    Anemia in chronic kidney disease: Status post 1 unit PRBC transfusion on 5/21. Hemoglobin stable. Continue Aranesp.  Thrombocytopenia: Continued gradual decline in platelet count 206 > 125 > 74 > 52. Unclear etiology. Does not seem to have received heparin since admission. Improved. Monitor.    Cardiomyopathy EF 25-30%- etiol undetermined- EF varies- 20% Aug 2014, 40% Nov 2014, 50% March 2015    ESRD- HD since April 2015: Continued on dialysis. Nephrology following.    Acute respiratory failure with hypoxia: Resolved.  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    Encephalopathy: Slowly improving. Family states 60% better. Agitated and did not allow HD today!  Aspiration pneumonia: Treated    Code Status: Full code  Family Communication: Discussed with patient's brother Elberta Fortis and his wife at bedside on 5/25. They indicate that they will be able to provide 24/7 supervision and assistance at home after his discharge from [place].  Disposition Plan: Appropriate for CIR- awaiting final recommendation.  Consultants:  Critical care  Nephrology  Neurology  Cardiology  Palliative care  Procedures:  ETT 5/12 >>>5/15  Rt IJ Trialysis cath 03/30/14 >>  5/19  Echocardiogram done 5/12: Decreased ejection fraction of 70-48%, diastolic flattening, moderate dilatation of right and left atrium and ventricles. Severe LVH   Antibiotics:  Vanc 5/11 >>5/11   Maxipime 5/11 >>>5/12   Imipenem 5/12 >>5/13   Levaquin 5/13 >> 5/18   HPI/Subjective: Patient denies complaints. Was sleeping in room - apparently agitated and did not allow HD today.  Objective: Filed Vitals:   04/12/14 0840  BP: 203/122  Pulse: 73  Temp: 98.3 F (36.8 C)  Resp: 18    Intake/Output Summary (Last 24 hours) at 04/12/14 1526 Last data filed at 04/12/14 0900  Gross per 24 hour  Intake   1200 ml  Output    200 ml  Net   1000 ml   Filed Weights   04/10/14 1653 04/10/14 2111 04/11/14 0851  Weight: 68.8 kg (151 lb 10.8 oz) 72.3 kg (159 lb 6.3 oz) 68.4 kg (150 lb 12.7 oz)    Exam:   General:  Alert and oriented x2, no acute distress. Flat affect. Lying comfortably in bed.  Cardiovascular: Regular rate and rhythm, G8-B1, 2/6 systolic ejection murmur  Respiratory: Clear to auscultation bilaterally. No increased work of breathing  Abdomen: Soft, nontender, nondistended, positive bowel sounds  Musculoskeletal: No clubbing or cyanosis, trace edema   CNS: Alert and oriented x2. No focal deficits.  Data Reviewed: Basic Metabolic Panel:  Recent Labs Lab 04/06/14 0510 04/09/14 1030  NA 139 142  K 4.0 3.9  CL 105 104  CO2 25 25  GLUCOSE 89 114*  BUN 11 22  CREATININE 2.87* 4.18*  CALCIUM 7.7* 8.1*  PHOS  --  3.6   Liver Function Tests:  Recent Labs Lab 04/09/14 1030 04/10/14 0414  ALBUMIN 2.7* 2.6*   No results found for this basename: LIPASE, AMYLASE,  in the last 168 hours No results found for this basename: AMMONIA,  in the last 168 hours CBC:  Recent Labs Lab 04/06/14 0510 04/08/14 1209 04/09/14 1030 04/10/14 0414 04/11/14 0716  WBC 8.6 6.4 7.4 7.4 7.4  HGB 7.6* 7.2* 9.0* 9.6* 9.9*  HCT 25.0* 22.4* 28.3* 31.0* 30.9*  MCV  95.1 95.7 93.1 94.8 93.6  PLT 206 125* 74* 52* 95*   Cardiac Enzymes: No results found for this basename: CKTOTAL, CKMB, CKMBINDEX, TROPONINI,  in the last 168 hours BNP (last 3 results)  Recent Labs  07/04/13 0640 02/11/14 0800 03/08/14 1224  PROBNP 6732.0* >70000.0* >70000.0*   CBG:  Recent Labs Lab 04/05/14 2016 04/06/14 0011 04/06/14 0411 04/06/14 1210 04/07/14 1128  GLUCAP 94 102* 90 86 92    No results found for this or any previous visit (from the past 240 hour(s)).   Studies: No results found.  Scheduled Meds: . amLODipine  10 mg Oral Daily  . carvedilol  25 mg Oral BID WC  . cloNIDine  0.3 mg Oral TID  . darbepoetin (ARANESP) injection - DIALYSIS  200 mcg Intravenous Q Thu-HD  . feeding supplement (ENSURE)  1 Container Oral BID  . hydrALAZINE  100 mg Oral 3 times per day  . isosorbide mononitrate  30 mg Oral Daily  . levETIRAcetam  500 mg Oral BID  . lisinopril  10 mg Oral BID  . multivitamin  1 tablet Oral QHS  . phenytoin  200 mg Oral BID   Continuous Infusions:   Principal Problem:   ICH (intracerebral hemorrhage) Active Problems:   Uncontrolled hypertension   Anemia  in chronic kidney disease   Cardiomyopathy EF 25-30%- etiol undetermined- EF varies- 20% Aug 2014, 40% Nov 2014, 50% March 2015   ESRD- HD since April 2015   Acute respiratory failure with hypoxia   Noncompliance with medication regimen in past- mult adm for uncontrolled HTN   Convulsions/seizures   Encephalopathy    Time spent: 25 minutes    Modena Jansky, MD, FACP, [facility]. [facility] Pager [phone]  If 7PM-7AM, please contact night-coverage [email or web address] Password [email or web address] 04/12/2014, 3:26 PM  04/12/2014, 3:26 PM  LOS: 14 days

## 2014-04-12 NOTE — Progress Notes (Signed)
Rehab admissions - I am following pt's case and met with pt in his room this am. I noted that am dialysis was not completed this am.   Per Hemodialysis note, When cannulating pt screamed out and thrashed violently in bed. Pt is not safe to dialyze at this time.  We will not be able to bring pt to CIR until medically stable and dialysis is completed. I spoke with Dr. Algis Liming about this and recommend that we pursue possible inpatient rehab admit tomorrow after hemodialysis needs are met.  I also spoke with pt's brother Elberta Fortis and sister in law, Angela Nevin who confirmed that they can provide 24-7 assistance at discharge following CIR. Sister-in-law shared that she works in the school system and can take a leave of absence if necessary.  I will follow pt's case and proceed accordingly. Please call me with any questions. Thanks.  Nanetta Batty, PT Rehabilitation Admissions Coordinator [phone]

## 2014-04-12 NOTE — Progress Notes (Signed)
Physical Therapy Treatment Patient Details Name: Matthew Beard MRN: [id number] DOB: 12/20/1975 Today's Date: 04/12/2014    History of Present Illness HPI: Matthew Beard is a 38 y.o. 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  Intubated on admission 5/11. Pt was extubated on 04/02/14. CT brain revealed a focus of parenchymal hemorrhage at the junction of the left temporal and left parietal lobes.    PT Comments    Pt progressing well with mobility. Ambulated with and without RW today with min/ mod A. Continues to need verbal and tactile cues for initiation and problem solving during mobility. PT will continue to follow.   Follow Up Recommendations  CIR     Equipment Recommendations  None recommended by PT    Recommendations for Other Services OT consult;Rehab consult     Precautions / Restrictions Precautions Precautions: Fall Restrictions Weight Bearing Restrictions: No Other Position/Activity Restrictions: Full Supervision for POs    Mobility  Bed Mobility Overal bed mobility: Needs Assistance Bed Mobility: Supine to Sit     Supine to sit: Min assist     General bed mobility comments: when verbally cued pt to get up he said "yes" but did not initiate mvmt. Once knee flexion cued manually, pt rolled without assist, then needed to be cued again to oush away from bed and was able to achieve upright posture with only min A  Transfers Overall transfer level: Needs assistance Equipment used: Rolling walker (2 wheeled);None Transfers: Sit to/from Stand Sit to Stand: Min assist;Min guard         General transfer comment: min A to initiate stand to RW and steady. After ambulation, pt did not have RW and cued to turn and look at chair before sitting and was able to  lower self to chair with min-guard A for safety.  Ambulation/Gait Ambulation/Gait assistance: Min assist;Mod assist Ambulation Distance (Feet): 100 Feet Assistive device: Rolling walker (2 wheeled);None Gait Pattern/deviations: Step-through pattern;Narrow base of support;Trunk flexed;Decreased stride length Gait velocity: decreased   General Gait Details: Began ambulation with min A and RW and pt with flexed trunk with frequent verbal and tactile cues for upright posture. Pt had difficulty navigating obstacles but was able to problem solve this himself with only verbal cues instead of tactile. After 50', RW removed and pt with improved posture but required occasional mod A for balance for last 50' of ambulation. Fiancee ambulating in front of him was good encouragement to continue with ambulation.     Stairs            Wheelchair Mobility    Modified Rankin (Stroke Patients Only)       Balance Overall balance assessment: Needs assistance Sitting-balance support: No upper extremity supported;Feet supported Sitting balance-Leahy Scale: Fair     Standing balance support: No upper extremity supported;During functional activity Standing balance-Leahy Scale: Poor Standing balance comment: needs min A to maintain standing safely without UE support. Maintained standing with leaning to give fiancee a hug whrn cued. Able to grasp her at least partially and regain balance afterwards.                    Cognition Arousal/Alertness: Awake/alert Behavior During Therapy: Flat affect Overall Cognitive Status: Impaired/Different from baseline Area of Impairment: Following commands;Safety/judgement;Problem solving;Memory  Following Commands: Follows one step commands with increased time Safety/Judgement: Decreased awareness of safety;Decreased awareness of deficits   Problem Solving: Slow processing;Decreased initiation;Difficulty sequencing;Requires verbal cues;Requires  tactile cues General Comments: when given adequate time to process, pt able to follow more than 50% of commands. When not following commands, a tactile cue helped pt to initiate and then he continued to carry through with command.     Exercises      General Comments        Pertinent Vitals/Pain VSS    Home Living                      Prior Function            PT Goals (current goals can now be found in the care plan section) Acute Rehab PT Goals Patient Stated Goal: did not state but pt agreeable to getting OOB PT Goal Formulation: With patient Time For Goal Achievement: 04/21/14 Potential to Achieve Goals: Good Progress towards PT goals: Progressing toward goals    Frequency  Min 4X/week    PT Plan Current plan remains appropriate    Co-evaluation             End of Session Equipment Utilized During Treatment: Gait belt Activity Tolerance: Patient tolerated treatment well Patient left: in chair;with chair alarm set;with family/visitor present;with call bell/phone within reach     Time: 1137-1202 PT Time Calculation (min): 25 min  Charges:  $Gait Training: 23-37 mins                    G Codes:     Lyanne CoVictoria Jorah Hua, PT  Acute Rehab Services  [phone]  Lawana ChambersVictoria L Shunda Rabadi 04/12/2014, 2:05 PM

## 2014-04-12 NOTE — Evaluation (Signed)
Occupational Therapy Evaluation Patient Details Name: Matthew Beard MRN: [id number] DOB: 12/16/1975 Today's Date: 04/12/2014    History of Present Illness HPI: Matthew Beard is a 38 y.o. 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  Intubated on admission 5/11. Pt was extubated on 04/02/14. CT brain revealed a focus of parenchymal hemorrhage at the junction of the left temporal and left parietal lobes.   Clinical Impression   This 38 year old man was admitted for the above problems/complications.  Unsure of PLOF.  Pt has strong family support and will benefit from skilled OT to increase safety and independence with adls.  Pt demonstrates delay processing and initiation as well as generalized strength, balance and endurance.  He would be a great CIR candidate for intensive therapy throughout the day with rest breaks.    Follow Up Recommendations  CIR    Equipment Recommendations  3 in 1 bedside comode    Recommendations for Other Services       Precautions / Restrictions Precautions Precautions: Fall Restrictions Weight Bearing Restrictions: No Other Position/Activity Restrictions: Full Supervision for POs      Mobility Bed Mobility   Bed Mobility: Supine to Sit     Supine to sit: Mod assist     General bed mobility comments: assist for trunk, cues for sequence  Transfers     Transfers: Sit to/from Stand Sit to Stand: Min assist         General transfer comment: assist to rise and steady    Balance   Sitting-balance support: Single extremity supported Sitting balance-Leahy Scale: Fair     Standing balance support: Bilateral upper extremity supported Standing balance-Leahy Scale: Poor                              ADL Overall ADL's : Needs  assistance/impaired Eating/Feeding: Minimal assistance;Cueing for safety;Sitting;Cueing for compensatory techinques (assist for bite size)   Grooming: Wash/dry face;Set up;Sitting;Supervision/safety   Upper Body Bathing: Sitting;Cueing for sequencing;Minimal assitance   Lower Body Bathing: Moderate assistance;Sit to/from stand;Cueing for safety;Cueing for sequencing   Upper Body Dressing : Minimal assistance;Sitting   Lower Body Dressing: Maximal assistance;Sit to/from stand   Toilet Transfer: Minimal assistance;Stand-pivot (to chair)             General ADL Comments: Performed spt to chair.  Pt washed face and started socks, but he was unable to pull beyond mid-foot.  focused on self-feeding this session.  Pt ate 1/2 of tray with min A for bite size, amount of liquids and to clear mouth as he pocketed on R.  Pt was getting fatiqued and frustrated 1/2 way through with intervention.  Family member continued feeding him.  He took appropriate size single sip twice.  He always needed help with bite size.  Family member came in 1/2 way through evaluation and states she will be taking care of patient from now on.  Explained OT role and goals.       Vision                     Perception Perception Comments: functional tasks were wfls   Praxis Praxis Praxis-Other Comments: functional tasks, wfls    Pertinent Vitals/Pain No c/o, signs or symptoms of pain  Hand Dominance Right   Extremity/Trunk Assessment Upper Extremity Assessment Upper Extremity Assessment: Generalized weakness;RUE deficits/detail RUE Deficits / Details: R shoulder tight beyond 90.  Strength grossly 3/5           Communication Communication Communication:  (very short answers; delayed processing)   Cognition Arousal/Alertness: Awake/alert Behavior During Therapy: WFL for tasks assessed/performed Overall Cognitive Status: Impaired/Different from baseline Area of Impairment: Following  commands;Safety/judgement;Problem solving;Memory     Memory: Decreased recall of precautions Following Commands: Follows one step commands with increased time Safety/Judgement: Decreased awareness of safety;Decreased awareness of deficits   Problem Solving: Slow processing;Decreased initiation     General Comments       Exercises       Shoulder Instructions      Home Living Family/patient expects to be discharged to:: Private residence Living Arrangements: Spouse/significant other Available Help at Discharge: Family;Available 24 hours/day Type of Home: House Home Access: Ramped entrance     Home Layout: One level               Home Equipment: Walker - 2 wheels;Wheelchair - manual          Prior Functioning/Environment          Comments: unsure of PLOF    OT Diagnosis: Generalized weakness   OT Problem List: Decreased strength;Decreased activity tolerance;Impaired balance (sitting and/or standing);Decreased cognition;Decreased safety awareness   OT Treatment/Interventions: Self-care/ADL training;Therapeutic activities;Cognitive remediation/compensation;Patient/family education;Balance training    OT Goals(Current goals can be found in the care plan section) Acute Rehab OT Goals Patient Stated Goal: Did not state OT Goal Formulation: With patient/family Time For Goal Achievement: 04/26/14 Potential to Achieve Goals: Good ADL Goals Pt Will Perform Eating: with set-up;with supervision;sitting (mod cues) Pt Will Perform Grooming: with supervision;sitting (3 tasks, mod cues) Pt Will Perform Lower Body Bathing: with min assist;sit to/from stand (min cues) Pt Will Transfer to Toilet: with min assist;ambulating;bedside commode Additional ADL Goal #1: Pt will sustain attention to task for 5 minutes without redirection and will initiate task within 5 seconds of cues  OT Frequency: Min 3X/week   Barriers to D/C:            Co-evaluation               End of Session    Activity Tolerance: Patient tolerated treatment well (was getting fatiqued at end of session) Patient left: in chair;with call bell/phone within reach;with chair alarm set;with family/visitor present   Time: [id number] OT Time Calculation (min): 31 min Charges:  OT General Charges $OT Visit: 1 Procedure OT Evaluation $Initial OT Evaluation Tier I: 1 Procedure OT Treatments $Self Care/Home Management : 23-37 mins G-Codes:    Matthew Beard 04/12/2014, 9:27 AM  Matthew Beard, OTR/L [phone] 04/12/2014

## 2014-04-12 NOTE — Progress Notes (Signed)
I have seen and examined this patient and agree with plan as outlined by B. Barnetta Chapel, NP.  Discussed with Mr. Bonnar and his significant other that he cannot flail around during dialysis, nor can he bite at the nurses who are trying to cannulate his AVF and if he continues to interfere with dialysis and the staff, we will consult hospice due to his inability to cooperate with HD. Jomarie Longs A Rashauna Tep,MD 04/12/2014 2:50 PM

## 2014-04-13 DIAGNOSIS — N179 Acute kidney failure, unspecified: Secondary | ICD-10-CM

## 2014-04-13 LAB — CBC
HEMATOCRIT: 31.9 % — AB (ref 39.0–52.0)
HEMOGLOBIN: 10.2 g/dL — AB (ref 13.0–17.0)
MCH: 30 pg (ref 26.0–34.0)
MCHC: 32 g/dL (ref 30.0–36.0)
MCV: 93.8 fL (ref 78.0–100.0)
PLATELETS: 185 10*3/uL (ref 150–400)
RBC: 3.4 MIL/uL — AB (ref 4.22–5.81)
RDW: 18.5 % — ABNORMAL HIGH (ref 11.5–15.5)
WBC: 7 10*3/uL (ref 4.0–10.5)

## 2014-04-13 LAB — GLUCOSE, CAPILLARY: Glucose-Capillary: 78 mg/dL (ref 70–99)

## 2014-04-13 NOTE — Progress Notes (Signed)
Subjective:  For hd today agrees to cannulate avf  Objective Vital signs in last 24 hours: Filed Vitals:   04/12/14 2110 04/12/14 2324 04/13/14 0530 04/13/14 0837  BP: 157/116 154/89 184/106 182/98  Pulse: 77  74 75  Temp: 99.5 F (37.5 C)  98.1 F (36.7 C) 98.4 F (36.9 C)  TempSrc: Oral   Oral  Resp: 18  18 20   Height:      Weight:  71.759 kg (158 lb 3.2 oz)    SpO2: 98%  96% 95%   Weight change: 3.359 kg (7 lb 6.5 oz)  Physical Exam: General:  Slow mentation/answers questions slowly "at [facility] , HD today and okay to use am"NAD/   Heart: RRR, 1/6sem lsb Lungs: CTA bilat  Abdomen: soft, nontender +BS  Extremities: Trace LE edema  Dialysis Access: L FA  AVF pos. Bruit  HD: MWF [facility]  4h 79kg 2/2.25 Bath 300/800 Heparin none due to bleed (was 8000) LUA AVF  Hectorol 4 Aranesp 100 Q Wed Venofer 100/hd thru 5/20  Last tsat 12%   Assessment:  1 Acute CVA - left temporal lobe, hemorrhagic  2 HTN/ vol excess-severe HTN  On admit to moderate HTN with volume down 13 kg from admission; also on 5 BP           meds but  think volume control is primary issue. Lowest weight now is 67 kg and probably can be lowered even further  3 ESRD MWF @ [facility], is not tolerating yesterday- attempt HD today 4 Anemia Hb 9.9, s/p1u RBC transfusion 5/21. on aranesp 200/wk Thursday  5 HPTH Ca+ 8.1. Phos 3.6 renagel on hold  6 Nutrition- alb 2.6 dsy diet. Multivit, supplements  7 NICM EF 25-30%  8 Dispo- to inpatient rehab   Lenny Pastel, PA-C [facility] Beeper [phone] 04/13/2014,10:40 AM  LOS: 15 days   Labs: Basic Metabolic Panel:  Recent Labs Lab 04/09/14 1030  NA 142  K 3.9  CL 104  CO2 25  GLUCOSE 114*  BUN 22  CREATININE 4.18*  CALCIUM 8.1*  PHOS 3.6   Liver Function Tests:  Recent Labs Lab 04/09/14 1030 04/10/14 0414  ALBUMIN 2.7* 2.6*  CBC:  Recent Labs Lab 04/08/14 1209 04/09/14 1030 04/10/14 0414 04/11/14 0716  WBC 6.4 7.4 7.4 7.4  HGB 7.2* 9.0*  9.6* 9.9*  HCT 22.4* 28.3* 31.0* 30.9*  MCV 95.7 93.1 94.8 93.6  PLT 125* 74* 52* 95*   Cardiac Enzymes: No results found for this basename: CKTOTAL, CKMB, CKMBINDEX, TROPONINI,  in the last 168 hours CBG:  Recent Labs Lab 04/06/14 1210 04/07/14 1128  GLUCAP 86 92    Studies/Results: No results found. Medications:   . amLODipine  10 mg Oral Daily  . carvedilol  25 mg Oral BID WC  . cloNIDine  0.3 mg Oral TID  . darbepoetin (ARANESP) injection - DIALYSIS  200 mcg Intravenous Q Thu-HD  . feeding supplement (ENSURE)  1 Container Oral BID  . hydrALAZINE  100 mg Oral 3 times per day  . isosorbide mononitrate  30 mg Oral Daily  . levETIRAcetam  500 mg Oral BID  . lisinopril  10 mg Oral BID  . multivitamin  1 tablet Oral QHS  . phenytoin  200 mg Oral BID

## 2014-04-13 NOTE — Progress Notes (Signed)
Physical Therapy Treatment Patient Details Name: Matthew AustriaDamon N Mcgaughey MRN: [id number] DOB: Nov 14, 1976 Today's Date: 04/13/2014    History of Present Illness Pt sleepy, but was able to arouse and walk to bathroom form BM with assit. Pt continues with lag time between command and motor response    PT Comments    Participative with PT and activity, but continues to need extra time  Follow Up Recommendations        Equipment Recommendations       Recommendations for Other Services       Precautions / Restrictions Precautions Precautions: Fall Restrictions Weight Bearing Restrictions: No Other Position/Activity Restrictions: Full Supervision for POs    Mobility  Bed Mobility Overal bed mobility: Needs Assistance Bed Mobility: Supine to Sit     Supine to sit: Min assist     General bed mobility comments: continues to need increased time  Transfers Overall transfer level: Needs assistance Equipment used: Rolling walker (2 wheeled) Transfers: Sit to/from Stand Sit to Stand: Min assist;Min guard         General transfer comment: pt tends to keep trunk in semiflexion  Ambulation/Gait Ambulation/Gait assistance: Min assist Ambulation Distance (Feet): 20 Feet (x2) Assistive device: Rolling walker (2 wheeled) Gait Pattern/deviations: Step-through pattern;Narrow base of support;Decreased stride length;Trunk flexed Gait velocity: decreased   General Gait Details: gait distance limited by need to use the bathroom and then RN here to take pt to HD,   Stairs            Wheelchair Mobility    Modified Rankin (Stroke Patients Only)       Balance Overall balance assessment: Needs assistance Sitting-balance support: Feet supported;Single extremity supported Sitting balance-Leahy Scale: Fair Sitting balance - Comments: pt leaning way forward when on toilet, reaching out with hands occasionally to help with balance   Standing balance support: Bilateral upper  extremity supported   Standing balance comment: uses RW to lean on                    Cognition Arousal/Alertness: Lethargic Behavior During Therapy: [facility] for tasks assessed/performed Overall Cognitive Status: Impaired/Different from baseline Area of Impairment: Following commands;Safety/judgement;Problem solving;Memory     Memory: Decreased recall of precautions Following Commands: Follows one step commands with increased time Safety/Judgement: Decreased awareness of safety;Decreased awareness of deficits   Problem Solving: Slow processing;Decreased initiation;Difficulty sequencing;Requires verbal cues;Requires tactile cues General Comments: when given adequate time to process, pt able to follow more than 50% of commands. When not following commands, a tactile cue helped pt to initiate and then he continued to carry through with command.     Exercises General Exercises - Lower Extremity Hip Flexion/Marching: AAROM;Both;10 reps (using ROM to assist in waking pt up)    General Comments General comments (skin integrity, edema, etc.): needs constant manual cues to direct RW around obstacles      Pertinent Vitals/Pain No c/o pain    Home Living                      Prior Function            PT Goals (current goals can now be found in the care plan section) Progress towards PT goals: Progressing toward goals    Frequency       PT Plan Current plan remains appropriate    Co-evaluation             End of Session   Activity Tolerance:  Patient tolerated treatment well Patient left: in bed;with nursing/sitter in room     Time: 1131-1151 PT Time Calculation (min): 20 min  Charges:  $Therapeutic Activity: 23-37 mins                    G Codes:     Alanea Woolridge K. [place], [place] [phone] 04/13/2014, 12:02 PM

## 2014-04-13 NOTE — Progress Notes (Signed)
I have seen and examined this patient and agree with plan as outlined by D. Zeyfang, PA-C. Matthew Beard A Matthew Cowles,MD 04/13/2014 1:30 PM

## 2014-04-13 NOTE — Progress Notes (Addendum)
Patient ID: Matthew Beard, male   DOB: 01/04/1976, 38 y.o.   MRN: [id number] [facility] PROGRESS NOTE  KRIS NO PJA:[id number] DOB: 1976/01/28 DOA: 03/29/2014 PCP: [name], MD  Brief narrative: 38 year-old [place] American male with past medical history of end-stage renal disease, noncompliant with dialysis, history of hypertension, nonischemic cardiomyopathy and secondary congestive heart failure who was brought into the ED 03/29/2014 status post fall at home and having a seizure. He was found to have blood pressures as high as 249/144 and a head CT revealed a small focal hemorrhage at the junction of the left parietal and temporal lobes. Patient was intubated for protection of airway and agitation. Per neurology, recommendation was to keep SBP in 120-160 range but this proved to be difficult as he required multiple medications for BP control. He was put on Keppra. MRI brain done 03/31/2014 showed a 1.8 acute left temporal lobe bleed with multiple microinfarcts and bleed. In addition, pt was thought to have aspiration pneumonia and has completed abx treatments on 04/05/2014. 2 D ECHO done noted drop in ejection fraction from 50% down to 25% as well as global hypokinesis. Cardiology was consulted and felt that hypertension was contributory. Palliative care was consulted and met with family on 04/02/2014. Pt was extubated on 04/03/14. HD was resumed on 04/05/14.   Assessment/Plan:   Principal Problem:  ICH (intracerebral hemorrhage)/acute hemorrhagic CVA   Stabilized.  Active Problems: Dysphagia  Dysphagia 3 is current diet Uncontrolled hypertension  BP 159/87  Continue norvasc 10 mg daily, coreg 25 mg PO BID, clonidine 0.3 mg PO TID, hydralazine 100 mg pO Q 8 hours, imdur 30 mg daily and lisinopril 10 mg PO BID Anemia in chronic kidney disease  Status post 1 unit PRBC transfusion on 5/21. Hemoglobin stable. Continue Aranesp.  Thrombocytopenia  Platelets are now  WNL Cardiomyopathy  Most recent 2 D ECHO in 03/2014 showed EHF 25-30% ESRD  HD per renal schedule Acute respiratory failure with hypoxia / Aspiration pneumonia   Resolved. Secondary to aspiration pneumonia  Convulsions/seizures   On Keppra.  Acute encephalopathy  slowly improving   DVT prophylaxis: SCD's bilaterally   Code Status: Full code  Family Communication: Family not at the bedside  Disposition Plan: to SNF or CIR  Consultants:  Critical care  Nephrology  Neurology  Cardiology  Palliative care Procedures:  ETT 5/12 >>>5/15  Rt IJ Trialysis cath 03/30/14 >> 5/19  Echocardiogram done 5/12: Decreased ejection fraction of 34-19%, diastolic flattening, moderate dilatation of right and left atrium and ventricles. Severe LVH Antibiotics:  Vanc 5/11 >>5/11  Maxipime 5/11 >>>5/12  Imipenem 5/12 >>5/13  Levaquin 5/13 >> 5/18  Robbie Lis, MD  [facility] Pager [phone]  If 7PM-7AM, please contact night-coverage [email or web address] Password [facility] 04/13/2014, 10:57 PM   LOS: 15 days    HPI/Subjective: No overnight events.   Objective: Filed Vitals:   04/13/14 1600 04/13/14 1607 04/13/14 1651 04/13/14 2100  BP: 178/118 179/116 212/96 159/87  Pulse: 70 75 76 79  Temp:  98.1 F (36.7 C) 98.4 F (36.9 C) 99.4 F (37.4 C)  TempSrc:  Oral Oral   Resp:  _0 Height:      Weight:    68.7 kg (151 lb 7.3 oz)  SpO2:  99% 95% 95%    Intake/Output Summary (Last 24 hours) at 04/13/14 2257 Last data filed at 04/13/14 2134  Gross per 24 hour  Intake   1400 ml  Output   2450  ml  Net  -1050 ml    Exam:   General:  Pt is sleeping, no acute distress  Cardiovascular: Regular rate and rhythm, S1/S2 appreciated, SEM appreciated 2/6  Respiratory: Clear to auscultation bilaterally, no wheezing, no crackles, no rhonchi  Abdomen: Soft, non tender, non distended, bowel sounds present, no guarding  Extremities: trace LE edema, pulses DP and PT palpable  bilaterally  Neuro: Grossly nonfocal  Data Reviewed: Basic Metabolic Panel:  Recent Labs Lab 04/09/14 1030  NA 142  K 3.9  CL 104  CO2 25  GLUCOSE 114*  BUN 22  CREATININE 4.18*  CALCIUM 8.1*  PHOS 3.6   Liver Function Tests:  Recent Labs Lab 04/09/14 1030 04/10/14 0414  ALBUMIN 2.7* 2.6*   No results found for this basename: LIPASE, AMYLASE,  in the last 168 hours No results found for this basename: AMMONIA,  in the last 168 hours CBC:  Recent Labs Lab 04/08/14 1209 04/09/14 1030 04/10/14 0414 04/11/14 0716 04/13/14 1207  WBC 6.4 7.4 7.4 7.4 7.0  HGB 7.2* 9.0* 9.6* 9.9* 10.2*  HCT 22.4* 28.3* 31.0* 30.9* 31.9*  MCV 95.7 93.1 94.8 93.6 93.8  PLT 125* 74* 52* 95* 185   Cardiac Enzymes: No results found for this basename: CKTOTAL, CKMB, CKMBINDEX, TROPONINI,  in the last 168 hours BNP: No components found with this basename: POCBNP,  CBG:  Recent Labs Lab 04/07/14 1128 04/13/14 1649  GLUCAP 92 78    No results found for this or any previous visit (from the past 240 hour(s)).   Studies: No results found.  Scheduled Meds: . amLODipine  10 mg Oral Daily  . carvedilol  25 mg Oral BID WC  . cloNIDine  0.3 mg Oral TID  . darbepoetin (ARANESP) injection - DIALYSIS  200 mcg Intravenous Q Thu-HD  . feeding supplement (ENSURE)  1 Container Oral BID  . hydrALAZINE  100 mg Oral 3 times per day  . isosorbide mononitrate  30 mg Oral Daily  . levETIRAcetam  500 mg Oral BID  . lisinopril  10 mg Oral BID  . multivitamin  1 tablet Oral QHS  . phenytoin  200 mg Oral BID

## 2014-04-13 NOTE — Progress Notes (Signed)
Rehab admissions - I am following pt's case and made note that pt is not cooperating with dialysis.   Pt's noncompliant behaviors with dialysis are a concern as this will impact his participation in acute rehab. In addition, these behaviors could likely lead to further medical issues.  Rn states that pt is scheduled for dialysis this afternoon as pt did not cooperate on two attempts yesterday for dialysis.  Per nephrology note on 5-25: Discussed with Matthew Beard and his significant other that he cannot flail around during dialysis, nor can he bite at the nurses who are trying to cannulate his AVF and if he continues to interfere with dialysis and the staff, we will consult hospice due to his inability to cooperate with HD.  I will continue to follow pt's case and proceed accordingly. At this time, due to patient's non-compliance with dialysis, skilled nursing may need to be pursued.  Please call me with any questions. Thanks.  Juliann Mule, PT Rehabilitation Admissions Coordinator [phone]'

## 2014-04-13 NOTE — Procedures (Signed)
Patient was seen on dialysis and the procedure was supervised. BFR 400 Via LUE AVF BP is 161/100.  Patient appears to be tolerating treatment well

## 2014-04-14 MED ORDER — NEPRO/CARBSTEADY PO LIQD
237.0000 mL | Freq: Every day | ORAL | Status: DC
  Administered 2014-04-14 – 2014-04-21 (×7): 237 mL via ORAL

## 2014-04-14 MED ORDER — LIDOCAINE-PRILOCAINE 2.5-2.5 % EX CREA
TOPICAL_CREAM | CUTANEOUS | Status: DC | PRN
  Administered 2014-04-16: 14:00:00 via TOPICAL
  Administered 2014-04-16: 1 via TOPICAL
  Filled 2014-04-14 (×2): qty 5

## 2014-04-14 NOTE — Progress Notes (Signed)
Occupational Therapy Treatment Patient Details Name: Matthew AustriaDamon N Sagen MRN: [id number] DOB: Jul 07, 1976 Today's Date: 04/14/2014    History of present illness Pt sleepy, but was able to arouse and walk to bathroom form BM with assit. Pt continues with lag time between command and motor response   OT comments  Pt making progress with functional goals and should continue with acute OT services to address impairments to  icrease level of function and safety. Discussed pt being agreeable to HD forn health benefits and to be able to go to CIR. Pt stated that he is going to HD tomorrow and that he undertands that he needs to go and for rehab         Follow Up Recommendations  CIR    Equipment Recommendations  3 in 1 bedside comode    Recommendations for Other Services      Precautions / Restrictions Precautions Precautions: Fall Restrictions Weight Bearing Restrictions: No Other Position/Activity Restrictions: Full Supervision for POs       Mobility Bed Mobility               General bed mobility comments: pt up in recliner  Transfers Overall transfer level: Needs assistance Equipment used: 2 person hand held assist Transfers: Sit to/from Stand Sit to Stand: Min assist;Min guard              Balance   Sitting-balance support: No upper extremity supported;Feet supported Sitting balance-Leahy Scale: Good     Standing balance support: Single extremity supported;During functional activity;No upper extremity supported Standing balance-Leahy Scale: Poor                     ADL       Grooming: Wash/dry face;Min guard;Standing;Cueing for safety;Cueing for sequencing;Minimal assistance (required mutliple verbal and physical cues to initiate and complete, pt fixated on wiping down counter ater washing hands)       Lower Body Bathing: Moderate assistance;Sit to/from stand;Cueing for safety;Cueing for sequencing (pt requires increased time to process for  initiation and completion of task)           Toilet Transfer: Minimal assistance;Regular Toilet;Grab bars;Min guard             General ADL Comments: pt requires increased time to process for initiation and completion of task, stood for several minutes despite multiple verbal and physical cues to initiate sitting on toilet. Pt required total A for clothing mgt and hygiene while standing in front of toilet                                         Behavior During Therapy: WFL for tasks assessed/performed Overall Cognitive Status: Impaired/Different from baseline Area of Impairment: Following commands;Safety/judgement;Problem solving;Memory     Memory: Decreased recall of precautions  Following Commands: Follows one step commands with increased time Safety/Judgement: Decreased awareness of safety;Decreased awareness of deficits   Problem Solving: Slow processing;Decreased initiation;Difficulty sequencing;Requires verbal cues;Requires tactile cues General Comments: when given adequate time to process, pt able to follow more than 50% of commands. When not following commands, a tactile cue helped pt to initiate and then he continued to carry through with command.     Extremity/Trunk Assessment                         General Comments  Pt cooperative  Pertinent Vitals/ Pain       Pt points to neck/throat area for pain but did not rate                                                          Frequency Min 3X/week     Progress Toward Goals  OT Goals(current goals can now be found in the care plan section)  Progress towards OT goals: Progressing toward goals     Plan Discharge plan remains appropriate    Co-evaluation    PT/OT/SLP Co-Evaluation/Treatment: Yes Reason for Co-Treatment: Necessary to address cognition/behavior during functional activity;For patient/therapist safety   OT goals addressed during session:  ADL's and self-care      End of Session Equipment Utilized During Treatment: Gait belt   Activity Tolerance Patient tolerated treatment well   Patient Left in chair;with call bell/phone within reach;with chair alarm set;Other (comment) (with SLP)             Time: 0600-4599 OT Time Calculation (min): 27 min  Charges: OT General Charges $OT Visit: 1 Procedure OT Treatments $Self Care/Home Management : 8-22 mins  Lafe Garin 04/14/2014, 3:03 PM

## 2014-04-14 NOTE — Progress Notes (Signed)
NUTRITION FOLLOW UP  Intervention:   Discontinue Ensure Pudding. Add Nepro Shake po daily, each supplement provides 425 kcal and 19 grams protein - thicken to appropriate consistency. RD to continue to follow nutrition care plan.  Nutrition Dx:   Inadequate oral intake related to dysphagia as evidenced by 50% meal completion, improving.  Goal:   Patient will meet >/=90% of estimated nutrition needs, unmet.  Monitor:   TF tolerance, weight trends, labs, I/Os  Assessment:   Pt admitted with convulsions at home. Pt found to have small focal hemorrhage at junction of left parietal and temporal lobes. Pt was intubated due to agitation. Pt has ESRD on HD at home; family reports he is not compliant.  CRRT discontinued 5/18. Continues on Dysphagia 3 diet with nectar-thickened liquids. Pt is eating 0-100% of meals. This morning, pt consumed 100% of his breakfast. Pt continues to lose weight.  CBG's: 78 - 92 Potassium and phosphorus WNL  Height: Ht Readings from Last 1 Encounters:  03/30/14 5\' 10"  (1.778 m)    Weight Status:   Wt Readings from Last 1 Encounters:  04/13/14 151 lb 7.3 oz (68.7 kg)   EDW per renal: 79 kg -- per renal note, lowest weight now is 67 kg and can probably be lowered even further  Re-estimated needs:  Kcal: 2000 - 2200 Protein: at least 95 gm Fluid: 1.2 liters  Skin: intact  Diet Order: Dysphagia 3 with nectar thick liquids   Intake/Output Summary (Last 24 hours) at 04/14/14 0925 Last data filed at 04/14/14 0900  Gross per 24 hour  Intake    800 ml  Output   2275 ml  Net  -1475 ml    Last BM: 5/24   Labs:   Recent Labs Lab 04/09/14 1030  NA 142  K 3.9  CL 104  CO2 25  BUN 22  CREATININE 4.18*  CALCIUM 8.1*  PHOS 3.6  GLUCOSE 114*    CBG (last 3)   Recent Labs  04/13/14 1649  GLUCAP 78    Scheduled Meds: . amLODipine  10 mg Oral Daily  . carvedilol  25 mg Oral BID WC  . cloNIDine  0.3 mg Oral TID  . darbepoetin  (ARANESP) injection - DIALYSIS  200 mcg Intravenous Q Thu-HD  . feeding supplement (ENSURE)  1 Container Oral BID  . hydrALAZINE  100 mg Oral 3 times per day  . isosorbide mononitrate  30 mg Oral Daily  . levETIRAcetam  500 mg Oral BID  . lisinopril  10 mg Oral BID  . multivitamin  1 tablet Oral QHS  . phenytoin  200 mg Oral BID    Continuous Infusions:    Jarold Motto MS, RD, LDN Inpatient Registered Dietitian Pager: [phone] After-hours pager: [phone]

## 2014-04-14 NOTE — Progress Notes (Signed)
Patient ID: Matthew Beard, male   DOB: 09-29-1976, 38 y.o.   MRN: [id number] [facility] PROGRESS NOTE  Matthew Beard ELF:[id number] DOB: 06-03-1976 DOA: 03/29/2014 PCP: [name], MD  Brief narrative: 38 year-old [place] American male with past medical history of end-stage renal disease, noncompliant with dialysis, history of hypertension, nonischemic cardiomyopathy and secondary congestive heart failure who was brought into the ED 03/29/2014 status post fall at home and having a seizure. He was found to have blood pressures as high as 249/144 and a head CT revealed a small focal hemorrhage at the junction of the left parietal and temporal lobes. Patient was intubated for protection of airway and agitation. Per neurology, recommendation was to keep SBP in 120-160 range but this proved to be difficult as he required multiple medications for BP control. He was put on Keppra. MRI brain done 03/31/2014 showed a 1.8 acute left temporal lobe bleed with multiple microinfarcts and bleed. In addition, pt was thought to have aspiration pneumonia and has completed abx treatments on 04/05/2014. 2 D ECHO done noted drop in ejection fraction from 50% down to 25% as well as global hypokinesis. Cardiology was consulted and felt that hypertension was contributory. Palliative care was consulted and met with family on 04/02/2014. Pt was extubated on 04/03/14. HD was resumed on 04/05/14.   Assessment/Plan:   Principal Problem:  ICH (intracerebral hemorrhage)/acute hemorrhagic CVA   Stabilized.  Dysphagia  Dysphagia 3 is current diet  Uncontrolled hypertension  Continue norvasc 10 mg daily, coreg 25 mg PO BID, clonidine 0.3 mg PO TID, hydralazine 100 mg pO Q 8 hours, imdur 30 mg daily and lisinopril 10 mg PO BID  BP elevated today, just received medications. IV PRN hydralazine   Anemia in chronic kidney disease  Status post 1 unit PRBC transfusion on 5/21. Hemoglobin stable. Continue Aranesp.    Thrombocytopenia  Platelets are now WNL  Cardiomyopathy  Most recent 2 D ECHO in 03/2014 showed EHF 25-30%  ESRD  HD per renal schedule  Acute respiratory failure with hypoxia / Aspiration pneumonia   Resolved. Secondary to aspiration pneumonia  Convulsions/seizures   On Keppra.  Acute encephalopathy  slowly improving    DVT prophylaxis: SCD's bilaterally   Code Status: Full code  Family Communication: Family not at the bedside  Disposition Plan: to SNF or CIR  Consultants:  Critical care  Nephrology  Neurology  Cardiology  Palliative care Procedures:  ETT 5/12 >>>5/15  Rt IJ Trialysis cath 03/30/14 >> 5/19  Echocardiogram done 5/12: Decreased ejection fraction of 58-52%, diastolic flattening, moderate dilatation of right and left atrium and ventricles. Severe LVH Antibiotics:  Vanc 5/11 >>5/11  Maxipime 5/11 >>>5/12  Imipenem 5/12 >>5/13  Levaquin 5/13 >> 5/18  Elmarie Shiley, MD  [facility] Pager [phone]  If 7PM-7AM, please contact night-coverage [email or web address] Password [facility] 04/14/2014, 5:58 PM   LOS: 16 days    HPI/Subjective: No overnight events.  He answer yes to some questions. He was able to tell me he was at [facility], it took him a long time to answer.  He was not able to tell me his age or why he is in the hospital.   Objective: Filed Vitals:   04/13/14 2100 04/14/14 0553 04/14/14 0900 04/14/14 1245  BP: 159/87 178/122 197/123 186/125  Pulse: 79 75 84 77  Temp: 99.4 F (37.4 C) 98.3 F (36.8 C) 98.3 F (36.8 C)   TempSrc:  Oral Oral   Resp: _0 18  Height:      Weight: 68.7 kg (151 lb 7.3 oz)     SpO2: 95% 100%      Intake/Output Summary (Last 24 hours) at 04/14/14 1758 Last data filed at 04/14/14 1600  Gross per 24 hour  Intake    700 ml  Output    475 ml  Net    225 ml    Exam:   General:  no acute distress  Cardiovascular: Regular rate and rhythm, S1/S2 appreciated, SEM appreciated  2/6  Respiratory: Clear to auscultation bilaterally, no wheezing, no crackles, no rhonchi  Abdomen: Soft, non tender, non distended, bowel sounds present, no guarding  Extremities: trace LE edema, pulses DP and PT palpable bilaterally  Neuro: Grossly nonfocal  Data Reviewed: Basic Metabolic Panel:  Recent Labs Lab 04/09/14 1030  NA 142  K 3.9  CL 104  CO2 25  GLUCOSE 114*  BUN 22  CREATININE 4.18*  CALCIUM 8.1*  PHOS 3.6   Liver Function Tests:  Recent Labs Lab 04/09/14 1030 04/10/14 0414  ALBUMIN 2.7* 2.6*   No results found for this basename: LIPASE, AMYLASE,  in the last 168 hours No results found for this basename: AMMONIA,  in the last 168 hours CBC:  Recent Labs Lab 04/08/14 1209 04/09/14 1030 04/10/14 0414 04/11/14 0716 04/13/14 1207  WBC 6.4 7.4 7.4 7.4 7.0  HGB 7.2* 9.0* 9.6* 9.9* 10.2*  HCT 22.4* 28.3* 31.0* 30.9* 31.9*  MCV 95.7 93.1 94.8 93.6 93.8  PLT 125* 74* 52* 95* 185   Cardiac Enzymes: No results found for this basename: CKTOTAL, CKMB, CKMBINDEX, TROPONINI,  in the last 168 hours BNP: No components found with this basename: POCBNP,  CBG:  Recent Labs Lab 04/13/14 1649  GLUCAP 78    No results found for this or any previous visit (from the past 240 hour(s)).   Studies: No results found.  Scheduled Meds: . amLODipine  10 mg Oral Daily  . carvedilol  25 mg Oral BID WC  . cloNIDine  0.3 mg Oral TID  . darbepoetin (ARANESP) injection - DIALYSIS  200 mcg Intravenous Q Thu-HD  . feeding supplement (ENSURE)  1 Container Oral BID  . hydrALAZINE  100 mg Oral 3 times per day  . isosorbide mononitrate  30 mg Oral Daily  . levETIRAcetam  500 mg Oral BID  . lisinopril  10 mg Oral BID  . multivitamin  1 tablet Oral QHS  . phenytoin  200 mg Oral BID

## 2014-04-14 NOTE — Progress Notes (Signed)
Physical Therapy Treatment Patient Details Name: Matthew Beard MRN: [id number] DOB: 1976-01-01 Today's Date: 04/14/2014    History of Present Illness HPI: Matthew Beard is a 38 y.o. 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  Intubated on admission 5/11. Pt was extubated on 04/02/14. CT brain revealed a focus of parenchymal hemorrhage at the junction of the left temporal and left parietal lobes.    PT Comments    Pt less responsive to verbal commands during session today and more impulsive than in session on Monday. However, continues to progress with mobility and ambulated 150' with min HHA. PT will continue to follow.   Follow Up Recommendations  CIR     Equipment Recommendations  None recommended by PT    Recommendations for Other Services OT consult;Rehab consult     Precautions / Restrictions Precautions Precautions: Fall Restrictions Weight Bearing Restrictions: No Other Position/Activity Restrictions: Full Supervision for POs    Mobility  Bed Mobility Overal bed mobility: Needs Assistance Bed Mobility: Sit to Supine       Sit to supine: Min assist   General bed mobility comments: requried min A to get legs into bed and scoot up to [facility]. Rolled each way when cued.  Transfers Overall transfer level: Needs assistance Equipment used: 1 person hand held assist;Rolling walker (2 wheeled) Transfers: Sit to/from Stand Sit to Stand: Min assist         General transfer comment: pt initially stood to RW but quickly pushed it away and preferred to not use it. Right lean with initial standing and periodically throughout mobility  Ambulation/Gait Ambulation/Gait assistance: Min assist Ambulation Distance (Feet): 150 Feet Assistive device: 1 person hand held  assist Gait Pattern/deviations: Staggering right;Decreased weight shift to left Gait velocity: fluctuating   General Gait Details: pt ambulated without RW today of his own choice (pushed it away). Min HHA to right side, occasional right sided stagger. Pt changed pace frequently today, impulsive and unsafe when began walking quickly. Followed about half of directional cues but question if a significant portion of this is due to visual deficits.    Stairs            Wheelchair Mobility    Modified Rankin (Stroke Patients Only) Modified Rankin (Stroke Patients Only) Pre-Morbid Rankin Score: Moderately severe disability Modified Rankin: Severe disability     Balance Overall balance assessment: Needs assistance Sitting-balance support: No upper extremity supported;Feet supported Sitting balance-Leahy Scale: Good     Standing balance support: No upper extremity supported;During functional activity Standing balance-Leahy Scale: Fair Standing balance comment: pt can stand without support but not safely, impulsive and with right lean                    Cognition Arousal/Alertness: Awake/alert Behavior During Therapy: Flat affect Overall Cognitive Status: Impaired/Different from baseline Area of Impairment: Attention;Memory;Following commands;Safety/judgement;Awareness;Problem solving   Current Attention Level: Sustained Memory: Decreased short-term memory Following Commands: Follows one step commands with increased time Safety/Judgement: Decreased awareness of safety;Decreased awareness of deficits   Problem Solving: Slow processing;Decreased initiation;Difficulty sequencing;Requires verbal cues;Requires tactile cues General Comments: pt with decreased following of commands today, even with increased time. Required more tactile cues than verbal cues, was less responsive to therapist, question cognition change versus depression, pt very flat today  Exercises Other  Exercises Other Exercises: discussed pt being agreeable to HD forn health benefits and to be able to go to CIR. Pt stated that he is going to HD tomorrow and that he undertands that he needs to go and for rehab    General Comments General comments (skin integrity, edema, etc.): sveral times with ambulation today, pt seemed to get "stuck". ould stop ambulating and would not start again with verbal cues, required tactile cue of guiding pt forward with leading arm. Then he would continue on      Pertinent Vitals/Pain VSS    Home Living                      Prior Function            PT Goals (current goals can now be found in the care plan section) Acute Rehab PT Goals Patient Stated Goal: did not state but pt agreeable to ambulation PT Goal Formulation: With patient Time For Goal Achievement: 04/21/14 Potential to Achieve Goals: Good Progress towards PT goals: Progressing toward goals    Frequency  Min 4X/week    PT Plan Current plan remains appropriate    Co-evaluation   Reason for Co-Treatment: Necessary to address cognition/behavior during functional activity   OT goals addressed during session: ADL's and self-care     End of Session Equipment Utilized During Treatment: Gait belt Activity Tolerance: Patient tolerated treatment well Patient left: in bed;with call bell/phone within reach;with bed alarm set     Time: 1530-1554 PT Time Calculation (min): 24 min  Charges:  $Gait Training: 23-37 mins                    G Codes:     Lyanne CoVictoria Taleeyah Bora, PT  Acute Rehab Services  [phone]  Lawana ChambersVictoria L Francine Hannan 04/14/2014, 4:39 PM

## 2014-04-14 NOTE — Progress Notes (Signed)
Subjective:  No cos. Nonverbal , opens eyes moves extrem to requests/ tolerated HD yesterday   Objective Vital signs in last 24 hours: Filed Vitals:   04/13/14 1651 04/13/14 2100 04/14/14 0553 04/14/14 0900  BP: 212/96 159/87 178/122 197/123  Pulse: 76 79 75 84  Temp: 98.4 F (36.9 C) 99.4 F (37.4 C) 98.3 F (36.8 C) 98.3 F (36.8 C)  TempSrc: Oral  Oral Oral  Resp: 18 18 18 18   Height:      Weight:  68.7 kg (151 lb 7.3 oz)    SpO2: 95% 95% 100%    Weight change: -4.859 kg (-10 lb 11.4 oz) Physical Exam:  General: Lethargic/ opens eyes / will not answer my questions  Slow movements to requests NAD/  Heart: RRR, 1/6sem lsb  Lungs: CTA bilat  Abdomen: soft, nontender +BS  Extremities: Trace LE edema  Dialysis Access: L FA AVF pos. Bruit   HD: MWF [facility]  4h 79kg 2/2.25 Bath 300/800 Heparin none due to bleed (was 8000) LUA AVF  Hectorol 4 Aranesp 100 Q Wed Venofer 100/hd thru 5/20  Last tsat 12%   Assessment:  1 Acute CVA - left temporal lobe, hemorrhagic  2 HTN/ vol excess-severe HTN On admit to moderate HTN with volume down 11 kg below prior yesterday 68.7kg post wt.; also on 5 BP meds but think volume control is primary issue. Lowest weight now is 67 kg and probably can be lowered even further  3 ESRD MWF @ [facility],  Off schedule sec to him refusing mon but 4hrs yesterday   4 Anemia Hb [id number]  s/p1u RBC transfusion 5/21. on aranesp 200/wk Thursday  5 HPTH Ca+ 8.1. Phos 3.6 renagel on hold  6 Nutrition- alb 2.6 dsy diet. Multivit, supplements  7 NICM EF 25-30%  8 Dispo- to inpatient rehab noted per rehab needs to be compliant with HD txs before accepted.  Lenny Pastel, PA-C [facility] Beeper [phone] 04/14/2014,10:42 AM  LOS: 16 days   Labs: Basic Metabolic Panel:  Recent Labs Lab 04/09/14 1030  NA 142  K 3.9  CL 104  CO2 25  GLUCOSE 114*  BUN 22  CREATININE 4.18*  CALCIUM 8.1*  PHOS 3.6   Liver Function Tests:  Recent Labs Lab  04/09/14 1030 04/10/14 0414  ALBUMIN 2.7* 2.6*   No results found for this basename: LIPASE, AMYLASE,  in the last 168 hours No results found for this basename: AMMONIA,  in the last 168 hours CBC:  Recent Labs Lab 04/08/14 1209 04/09/14 1030 04/10/14 0414 04/11/14 0716 04/13/14 1207  WBC 6.4 7.4 7.4 7.4 7.0  HGB 7.2* 9.0* 9.6* 9.9* 10.2*  HCT 22.4* 28.3* 31.0* 30.9* 31.9*  MCV 95.7 93.1 94.8 93.6 93.8  PLT 125* 74* 52* 95* 185   Cardiac Enzymes: No results found for this basename: CKTOTAL, CKMB, CKMBINDEX, TROPONINI,  in the last 168 hours CBG:  Recent Labs Lab 04/07/14 1128 04/13/14 1649  GLUCAP 92 78    Studies/Results: No results found. Medications:   . amLODipine  10 mg Oral Daily  . carvedilol  25 mg Oral BID WC  . cloNIDine  0.3 mg Oral TID  . darbepoetin (ARANESP) injection - DIALYSIS  200 mcg Intravenous Q Thu-HD  . feeding supplement (ENSURE)  1 Container Oral BID  . feeding supplement (NEPRO CARB STEADY)  237 mL Oral Daily  . hydrALAZINE  100 mg Oral 3 times per day  . isosorbide mononitrate  30 mg Oral Daily  .  levETIRAcetam  500 mg Oral BID  . lisinopril  10 mg Oral BID  . multivitamin  1 tablet Oral QHS  . phenytoin  200 mg Oral BID

## 2014-04-14 NOTE — Progress Notes (Signed)
Speech Language Pathology Treatment: Dysphagia;Cognitive-Linquistic  Patient Details Name: Matthew Beard MRN: [id number] DOB: August 09, 1976 Today's Date: 04/14/2014 Time: 3646-8032 SLP Time Calculation (min): 17 min  Assessment / Plan / Recommendation Clinical Impression  Pt was seen with OT for skilled co-tx targeting cognitive-linguistic goals during functional self-care task. Pt required Mod-Max verbal/tactile cues for initiation with one-step commands. Spontaneous vocalizations were noted (suspect out of frustration), although no spontaneous verbal output. Pt responded to yes/no questions regarding needs throughout self-care tasks with responses >75% of the time. SLP/OT reinforced the importance of participating in HD in order to maximize chances of going to CIR upon d/c. When asked if he would participate in HD and if he wanted to go to CIR, pt responded "yes" to both questions.   Suspect pt's behavior stems from reduced ability to receptively understand language as well as his reduced ability to communicate what he wants/needs and how he feels. Medical team can maximize communication with pt by utilizing yes/no questions at this time.  Upon OT departure, pt consumed trials of thin liquids with overt coughing noted. Pt continues to tolerate nectar thick liquids, although he required Mod cues to utilize a slow rate. Continue plan of care.   HPI HPI: Matthew Beard is a 38 y.o. 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  Intubated on admission 5/11. Pt was extubated on 04/02/14. CT brain revealed a focus of parenchymal hemorrhage at the junction of the left temporal and left parietal lobes.  BSE recommended dys 3/Nectar thick liquids. ST in for follow up to determine readiness to advance diet.    Pertinent Vitals N/A  SLP Plan  Continue with current plan of care    Recommendations Diet recommendations: Dysphagia 3 (mechanical soft);Nectar-thick liquid Liquids provided via: Cup;No straw Medication Administration: Crushed with puree Supervision: Full supervision/cueing for compensatory strategies;Patient able to self feed Compensations: Slow rate;Small sips/bites;Check for pocketing Postural Changes and/or Swallow Maneuvers: Seated upright 90 degrees              Oral Care Recommendations: Oral care BID Follow up Recommendations: Inpatient Rehab Plan: Continue with current plan of care    GO      Maxcine Ham, M.A. CCC-SLP [phone]  Maxcine Ham 04/14/2014, 3:09 PM

## 2014-04-14 NOTE — Progress Notes (Addendum)
Rehab admissions - I continue to follow pt's case and noted that he did complete dialysis yesterday.  I have spoken with our rehab team and continue to make note of his concerning noncompliant behaviors with tolerating dialysis. We are concerned that if pt does not cooperate with his dialysis, that this would impact his participation in acute rehab. In addition, refusing dialysis could likely lead to further medical complications.  Per our rehab team, it is our recommendation that pt demonstrate consistent participation with dialysis and complete at least two more sessions of dialysis.   We will consider possible inpatient admit as pt demonstrates consistent cooperation with his dialysis as he must be medically stable and participatory in his care to consider CIR stay. This is also dependent on our bed availability.  I left voicemail update for pt's sister-in-law.  I updated Elnita Maxwell, case Production designer, theatre/television/film and left voicemail for [employer], Child psychotherapist about pt's case. Please call me with any questions. Thanks.  Juliann Mule, PT Rehabilitation Admissions Coordinator [phone]

## 2014-04-14 NOTE — Progress Notes (Signed)
Rehab admissions - I checked on pt and he was very sleepy, sitting up in chair. Pt was not verbally responsive to my conversation but did nod appropriately at times. I encouraged him to keep cooperating with dialysis and explained that he needs to cooperate and tolerate two more sessions of HD before we would consider admitting him to CIR, pending bed availability.  I also called pt's Scharlene Corn and spoke briefly with her. In our conversation, she did not seem aware of the tentative plan for pt to go to brother/sister-in-law's house after rehab stay. Our phone call then got disconnected and I got her voicemail when I returned the dropped call.  I will follow pt's case and proceed accordingly. See my previous note regarding our concerns with pt's noncompliant behaviors with tolerating dialysis and the impact of that on a possible rehab stay.  Please call me with any questions. Thanks.  Juliann Mule, PT Rehabilitation Admissions Coordinator [phone]

## 2014-04-14 NOTE — Progress Notes (Signed)
I have seen and examined this patient and agree with plan as outlined by D. Zeyfang, PA-C.  Will try EMLA cream to help decrease anxiety and pain with HD tomorrow. Jomarie Longs A Choua Ikner,MD 04/14/2014 3:05 PM

## 2014-04-15 MED ORDER — NEPRO/CARBSTEADY PO LIQD: 237.0000 mL | ORAL | Status: DC | PRN

## 2014-04-15 MED ORDER — ALTEPLASE 2 MG IJ SOLR: 2.0000 mg | Freq: Once | INTRAMUSCULAR | Status: DC | PRN

## 2014-04-15 MED ORDER — HYDRALAZINE HCL 25 MG PO TABS
25.0000 mg | ORAL_TABLET | Freq: Once | ORAL | Status: AC
  Administered 2014-04-15: 25 mg via ORAL
  Filled 2014-04-15: qty 1

## 2014-04-15 MED ORDER — LIDOCAINE HCL (PF) 1 % IJ SOLN: 5.0000 mL | INTRAMUSCULAR | Status: DC | PRN

## 2014-04-15 MED ORDER — LIDOCAINE-PRILOCAINE 2.5-2.5 % EX CREA
1.0000 "application " | TOPICAL_CREAM | CUTANEOUS | Status: DC | PRN
  Filled 2014-04-15: qty 5

## 2014-04-15 MED ORDER — PENTAFLUOROPROP-TETRAFLUOROETH EX AERO: 1.0000 "application " | INHALATION_SPRAY | CUTANEOUS | Status: DC | PRN

## 2014-04-15 MED ORDER — SODIUM CHLORIDE 0.9 % IV SOLN: 100.0000 mL | INTRAVENOUS | Status: DC | PRN

## 2014-04-15 MED ORDER — LIDOCAINE-PRILOCAINE 2.5-2.5 % EX CREA: 1.0000 "application " | TOPICAL_CREAM | CUTANEOUS | Status: DC | PRN

## 2014-04-15 MED ORDER — HEPARIN SODIUM (PORCINE) 1000 UNIT/ML DIALYSIS: 1000.0000 [IU] | INTRAMUSCULAR | Status: DC | PRN

## 2014-04-15 MED ORDER — ALTEPLASE 2 MG IJ SOLR
2.0000 mg | Freq: Once | INTRAMUSCULAR | Status: DC | PRN
Start: 2014-04-15 — End: 2014-04-15
  Filled 2014-04-15: qty 2

## 2014-04-15 MED ORDER — NEPRO/CARBSTEADY PO LIQD
237.0000 mL | ORAL | Status: DC | PRN
  Filled 2014-04-15: qty 237

## 2014-04-15 NOTE — Progress Notes (Signed)
Patient ID: Matthew Beard, male   DOB: 1976-06-30, 38 y.o.   MRN: [id number] [facility] PROGRESS NOTE  Matthew Beard HEN:[id number] DOB: 1976-02-22 DOA: 03/29/2014 PCP: [name], MD  Brief narrative: 38 year-old [place] American male with past medical history of end-stage renal disease, noncompliant with dialysis, history of hypertension, nonischemic cardiomyopathy and secondary congestive heart failure who was brought into the ED 03/29/2014 status post fall at home and having a seizure. He was found to have blood pressures as high as 249/144 and a head CT revealed a small focal hemorrhage at the junction of the left parietal and temporal lobes. Patient was intubated for protection of airway and agitation. Per neurology, recommendation was to keep SBP in 120-160 range but this proved to be difficult as he required multiple medications for BP control. He was put on Keppra. MRI brain done 03/31/2014 showed a 1.8 acute left temporal lobe bleed with multiple microinfarcts and bleed. In addition, pt was thought to have aspiration pneumonia and has completed abx treatments on 04/05/2014. 2 D ECHO done noted drop in ejection fraction from 50% down to 25% as well as global hypokinesis. Cardiology was consulted and felt that hypertension was contributory. Palliative care was consulted and met with family on 04/02/2014. Pt was extubated on 04/03/14. HD was resumed on 04/05/14.   Assessment/Plan:   Principal Problem:  ICH (intracerebral hemorrhage)/acute hemorrhagic CVA   Stabilized.  Dysphagia  Dysphagia 3 is current diet  Uncontrolled hypertension  Continue norvasc 10 mg daily, coreg 25 mg PO BID, clonidine 0.3 mg PO TID, hydralazine 100 mg pO Q 8 hours, imdur 30 mg daily and lisinopril 10 mg PO BID  IV PRN hydralazine   Didn't received BP medications this morning, he was on dialysis. No IV Access. BP elevated. Will give one time dose hydralazine by mouth.   Anemia in chronic kidney  disease  Status post 1 unit PRBC transfusion on 5/21. Hemoglobin stable. Continue Aranesp.   Thrombocytopenia  Platelets are now WNL  Cardiomyopathy  Most recent 2 D ECHO in 03/2014 showed EHF 25-30%  ESRD  HD per renal schedule  Acute respiratory failure with hypoxia / Aspiration pneumonia   Resolved. Secondary to aspiration pneumonia  Convulsions/seizures   On Keppra.  Acute encephalopathy  slowly improving   Agitation at times. Will discussed with neuro or will consider psych consult.   DVT prophylaxis: SCD's bilaterally   Code Status: Full code  Family Communication: Family not at the bedside  Disposition Plan: to SNF or CIR  Consultants:  Critical care  Nephrology  Neurology  Cardiology  Palliative care Procedures:  ETT 5/12 >>>5/15  Rt IJ Trialysis cath 03/30/14 >> 5/19  Echocardiogram done 5/12: Decreased ejection fraction of 36-14%, diastolic flattening, moderate dilatation of right and left atrium and ventricles. Severe LVH Antibiotics:  Vanc 5/11 >>5/11  Maxipime 5/11 >>>5/12  Imipenem 5/12 >>5/13  Levaquin 5/13 >> 5/18  Elmarie Shiley, MD  [facility] Pager [phone]  If 7PM-7AM, please contact night-coverage [email or web address] Password [email or web address] 04/15/2014, 5:13 PM   LOS: 17 days    HPI/Subjective: Say yes to some questions. Denies pain.   Objective: Filed Vitals:   04/14/14 2051 04/15/14 0521 04/15/14 1305 04/15/14 1631  BP: 179/99 172/100 208/141 215/120  Pulse: 75 66 75 81  Temp: 99 F (37.2 C) 98.7 F (37.1 C) 97.4 F (36.3 C) 98.5 F (36.9 C)  TempSrc: Oral Oral Oral Oral  Resp: 21 17 18 18   Height:  Weight: 70 kg (154 lb 5.2 oz)  71.2 kg (156 lb 15.5 oz)   SpO2: 98% 98% 98% 97%   No intake or output data in the 24 hours ending 04/15/14 1713  Exam:   General:  no acute distress  Cardiovascular: Regular rate and rhythm, S1/S2 appreciated, SEM appreciated 2/6  Respiratory: Clear to auscultation bilaterally, no  wheezing, no crackles, no rhonchi  Abdomen: Soft, non tender, non distended, bowel sounds present, no guarding  Extremities: trace LE edema, pulses DP and PT palpable bilaterally   Data Reviewed: Basic Metabolic Panel:  Recent Labs Lab 04/09/14 1030  NA 142  K 3.9  CL 104  CO2 25  GLUCOSE 114*  BUN 22  CREATININE 4.18*  CALCIUM 8.1*  PHOS 3.6   Liver Function Tests:  Recent Labs Lab 04/09/14 1030 04/10/14 0414  ALBUMIN 2.7* 2.6*   No results found for this basename: LIPASE, AMYLASE,  in the last 168 hours No results found for this basename: AMMONIA,  in the last 168 hours CBC:  Recent Labs Lab 04/09/14 1030 04/10/14 0414 04/11/14 0716 04/13/14 1207  WBC 7.4 7.4 7.4 7.0  HGB 9.0* 9.6* 9.9* 10.2*  HCT 28.3* 31.0* 30.9* 31.9*  MCV 93.1 94.8 93.6 93.8  PLT 74* 52* 95* 185   Cardiac Enzymes: No results found for this basename: CKTOTAL, CKMB, CKMBINDEX, TROPONINI,  in the last 168 hours BNP: No components found with this basename: POCBNP,  CBG:  Recent Labs Lab 04/13/14 1649  GLUCAP 78    No results found for this or any previous visit (from the past 240 hour(s)).   Studies: No results found.  Scheduled Meds: . amLODipine  10 mg Oral Daily  . carvedilol  25 mg Oral BID WC  . cloNIDine  0.3 mg Oral TID  . darbepoetin (ARANESP) injection - DIALYSIS  200 mcg Intravenous Q Thu-HD  . feeding supplement (ENSURE)  1 Container Oral BID  . hydrALAZINE  100 mg Oral 3 times per day  . isosorbide mononitrate  30 mg Oral Daily  . levETIRAcetam  500 mg Oral BID  . lisinopril  10 mg Oral BID  . multivitamin  1 tablet Oral QHS  . phenytoin  200 mg Oral BID

## 2014-04-15 NOTE — Progress Notes (Signed)
Spoke with dialysis nurse Concepcion Elk.  Matthew Beard states that needle was inserted in to patient's dialysis site for dialysis.  Upon inserting the needle patients dialysis site infiltrated.  Patient became immediately agitated and started screaming, patient arms began moving aggressively.  Nephrologist informed of patient's dialysis site infiltration and behavior.  Nephrologist gave order to stop dialysis and to send patient back to his room.  Patient was sent back to his room.  Reita Cliche 04/15/2014

## 2014-04-15 NOTE — Progress Notes (Signed)
Subjective:  Opens eyes to voice, /acknowledges my presence but no voice response to my questions / girl friend at beside and RN  Also reports some voiced interaction with them  Objective Vital signs in last 24 hours: Filed Vitals:   04/14/14 0900 04/14/14 1245 04/14/14 2051 04/15/14 0521  BP: 197/123 186/125 179/99 172/100  Pulse: 84 77 75 66  Temp: 98.3 F (36.8 C)  99 F (37.2 C) 98.7 F (37.1 C)  TempSrc: Oral  Oral Oral  Resp: 18 18 21 17   Height:      Weight:   70 kg (154 lb 5.2 oz)   SpO2:   98% 98%   Weight change: 3.1 kg (6 lb 13.4 oz)  Physical Exam:  General: Lethargic/ opens eyes / will not answer my questions Slow movements to requests NAD/  Heart: RRR, 1/6sem lsb  Lungs: CTA bilat  Abdomen: soft, nontender +BS  Extremities: Trace LE edema  Dialysis Access: L FA AVF pos. Bruit   HD: MWF [facility]  4h 79kg 2/2.25 Bath 300/800 Heparin none due to bleed (was 8000) LUA AVF  Hectorol 4 Aranesp 100 Q Wed Venofer 100/hd thru 5/20  Last tsat 12%   Assessment: /PLAN 1 Acute CVA - left temporal lobe, hemorrhagic /Rehab Med evaluating in hosp admit 2 HTN/ vol excess-severe HTN On admit to moderate HTN  And  volume  Has been down 11 kg/ also on 5 BP meds but think volume control is primary issue. Lowest weight now is 67 kg and probably can be lowered even further / fu wts and bp today 3 [place] [facility] sec to him refusing mon but 4hrs  Tuesday tolerated  /HD today  Using emla cream to avf 4 Anemia Hb 9.9.10.2 s/p1u RBC transfusion 5/21. on aranesp 200/wk Thursday  5 HPTH Ca+ 8.1. Phos 3.6 renagel on hold / reck labs 6 Nutrition- alb 2.6 dsy diet. Multivit, supplements  7 NICM EF 25-30%  8 Dispo- to inpatient rehab noted per rehab needs to be compliant with HD txs before accepted.   Lenny Pastelavid Rivka Baune, PA-C [facility] Beeper [phone] 04/15/2014,10:15 AM  LOS: 17 days   Labs: Basic Metabolic Panel:  Recent Labs Lab 04/09/14 1030   NA 142  K 3.9  CL 104  CO2 25  GLUCOSE 114*  BUN 22  CREATININE 4.18*  CALCIUM 8.1*  PHOS 3.6   Liver Function Tests:  Recent Labs Lab 04/09/14 1030 04/10/14 0414  ALBUMIN 2.7* 2.6*    Recent Labs Lab 04/08/14 1209 04/09/14 1030 04/10/14 0414 04/11/14 0716 04/13/14 1207  WBC 6.4 7.4 7.4 7.4 7.0  HGB 7.2* 9.0* 9.6* 9.9* 10.2*  HCT 22.4* 28.3* 31.0* 30.9* 31.9*  MCV 95.7 93.1 94.8 93.6 93.8  PLT 125* 74* 52* 95* 185    Recent Labs Lab 04/13/14 1649  GLUCAP 78  Medications:   . amLODipine  10 mg Oral Daily  . carvedilol  25 mg Oral BID WC  . cloNIDine  0.3 mg Oral TID  . darbepoetin (ARANESP) injection - DIALYSIS  200 mcg Intravenous Q Thu-HD  . feeding supplement (ENSURE)  1 Container Oral BID  . feeding supplement (NEPRO CARB STEADY)  237 mL Oral Daily  . hydrALAZINE  100 mg Oral 3 times per day  . isosorbide mononitrate  30 mg Oral Daily  . levETIRAcetam  500 mg Oral BID  . lisinopril  10 mg Oral BID  . multivitamin  1 tablet Oral QHS  .  phenytoin  200 mg Oral BID

## 2014-04-15 NOTE — Progress Notes (Signed)
I have seen and examined this patient and agree with plan as outlined by D. Zeyfang, PA-C. Jomarie Longs A Lorenda Grecco,MD 04/15/2014 11:08 AM

## 2014-04-15 NOTE — Progress Notes (Signed)
Patient's blood pressure elevated at 215/120, pulse 90.  Attempted insertion of 22G IV for intravenous hydralazine, patient became very agitated and started screaming curse words loudly.  Attempt unsuccessful.  Dr. Sunnie Nielsen informed of unsuccessful attempt.  One time PO hydralazine ordered.  Patient currently still screaming curse words randomly.  Will continue to monitor patient's blood pressure.    Reita Cliche 04/15/2014

## 2014-04-16 ENCOUNTER — Inpatient Hospital Stay (HOSPITAL_COMMUNITY): Payer: Medicaid Other

## 2014-04-16 MED ORDER — ALTEPLASE 2 MG IJ SOLR
2.0000 mg | Freq: Once | INTRAMUSCULAR | Status: AC | PRN
  Filled 2014-04-16: qty 2

## 2014-04-16 MED ORDER — LIDOCAINE HCL (PF) 1 % IJ SOLN: 5.0000 mL | INTRAMUSCULAR | Status: DC | PRN

## 2014-04-16 MED ORDER — PENTAFLUOROPROP-TETRAFLUOROETH EX AERO: 1.0000 "application " | INHALATION_SPRAY | CUTANEOUS | Status: DC | PRN

## 2014-04-16 MED ORDER — QUETIAPINE 12.5 MG HALF TABLET
12.5000 mg | ORAL_TABLET | Freq: Every day | ORAL | Status: DC
  Administered 2014-04-16: 12.5 mg via ORAL
  Filled 2014-04-16 (×2): qty 1

## 2014-04-16 MED ORDER — HEPARIN SODIUM (PORCINE) 1000 UNIT/ML DIALYSIS
1000.0000 [IU] | INTRAMUSCULAR | Status: DC | PRN
  Filled 2014-04-16: qty 1

## 2014-04-16 MED ORDER — NEPRO/CARBSTEADY PO LIQD: 237.0000 mL | ORAL | Status: DC | PRN

## 2014-04-16 MED ORDER — LIDOCAINE-PRILOCAINE 2.5-2.5 % EX CREA: 1.0000 "application " | TOPICAL_CREAM | CUTANEOUS | Status: DC | PRN

## 2014-04-16 MED ORDER — SODIUM CHLORIDE 0.9 % IV SOLN: 100.0000 mL | INTRAVENOUS | Status: DC | PRN

## 2014-04-16 MED ORDER — DOXERCALCIFEROL 4 MCG/2ML IV SOLN
2.0000 ug | INTRAVENOUS | Status: DC
  Filled 2014-04-16: qty 2

## 2014-04-16 NOTE — Procedures (Signed)
Patient was seen on dialysis and the procedure was supervised. BFR 200 Via LAVF BP is 194/120.  Patient continues to have issues with HD.  He again became aggressive during cannulation and it took 4 staff members to hold him while the nurse cannulated his AVF, he caused an infiltration on an earlier attempt today.  The current situation is untenable and will need Psych and Neuro to comment on his capacity to follow commands and competency as he is making it dangerous for himself as well as staff to continue with dialysis.  Tunneled HD cath is only a short term fix and would consider palliative care eval to help set goals and limits of care as well as expectations for family.

## 2014-04-16 NOTE — Progress Notes (Signed)
Occupational Therapy Treatment Patient Details Name: Matthew AustriaDamon N Guhl MRN: [id number] DOB: 28-Mar-1976 Today's Date: 04/16/2014    History of present illness HPI: Matthew Beard is a 38 y.o. 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  Intubated on admission 5/11. Pt was extubated on 04/02/14. CT brain revealed a focus of parenchymal hemorrhage at the junction of the left temporal and left parietal lobes.   OT comments  Pt with minimal participation and interaction today. Sat on EOB for grooming but BP elevated at 208/128. Informed nursing and assisted pt back to supine. Pt with BM so assisted with clean up from bed level. Pt at times becoming agitated with therapist assist and appeared to resist movement at times. Will need to participate better in order to benefit from CIR. Otherwise will need SNF.   Follow Up Recommendations  CIR (depends on if pt more willing to participate. Otherwise may need SNF)    Equipment Recommendations  3 in 1 bedside comode    Recommendations for Other Services      Precautions / Restrictions Precautions Precautions: Fall Precaution Comments: monitor BP-running high Restrictions Weight Bearing Restrictions: No Other Position/Activity Restrictions: Full Supervision for POs       Mobility Bed Mobility Overal bed mobility: Needs Assistance Bed Mobility: Supine to Sit     Supine to sit: Max assist Sit to supine: Total assist   General bed mobility comments: pt didnt help with bringing LEs up onto bed or with lower trunk to bed. He did help with supine to sit bringing trunk to upright with assist and rail. Required assist for LEs over to EOB.   Transfers                 General transfer comment: deferred today due to high BP    Balance        Sitting balance - Comments: min guard assist at EOB for grooming.                           ADL       Grooming: Wash/dry face;Sitting;Maximal assistance                                 General ADL Comments: Pt not verbalizing or interacting much during session. Pt did not initiate bringing LEs over to EOB to command. Required multimodal cues and assist to initiate bringing LEs over toward EOB and then pt started to push up to sitting. Pt indicating some discomfort with BP cuff squeezing arm though a moan. BP 208/128. Informed nursing and assisted pt back to supine after working on simple grooming task at EOB only due to high BP. Pt required max assist and max verbal cues and hand over hand to initiate. Pt perseverated on washing R side of face only and not with thoroughness. When OT tried to assist pt to wash L side of face, pt yelling out. Helped pt back to supine. He did not initiate brining LEs onto bed wtih increaed time and max cues. Pt seemed to be resisting the movement to bringing legs onto bed.       Vision  Perception     Praxis      Cognition   Behavior During Therapy: Flat affect Overall Cognitive Status: Difficult to assess Area of Impairment: Attention;Memory;Following commands;Safety/judgement;Awareness;Problem solving        Following Commands: Follows one step commands inconsistently Safety/Judgement: Decreased awareness of safety;Decreased awareness of deficits   Problem Solving: Slow processing;Decreased initiation;Difficulty sequencing;Requires verbal cues;Requires tactile cues General Comments: not interacting with therapist or following commands well. Appeared to be resisting assist with moving LEs at times. required max increased time and multimodal cues to participate minimally.     Extremity/Trunk Assessment               Exercises     Shoulder Instructions       General Comments       Pertinent Vitals/ Pain       Pt indicating some discomfort with BP cuff squeezing: BP at EOB       208/128  Home Living                                          Prior Functioning/Environment              Frequency Min 3X/week     Progress Toward Goals  OT Goals(current goals can now be found in the care plan section)  Progress towards OT goals: Not progressing toward goals - comment     Plan Discharge plan remains appropriate    Co-evaluation                 End of Session     Activity Tolerance Treatment limited secondary to agitation;Other (comment) (elevated BP)   Patient Left in bed;with call bell/phone within reach;with bed alarm set;with nursing/sitter in room   Nurse Communication          Time: 0915-1000 OT Time Calculation (min): 45 min  Charges: OT General Charges $OT Visit: 1 Procedure OT Treatments $Self Care/Home Management : 8-22 mins $Therapeutic Activity: 23-37 mins  Sabino Gasser Eragon Hammond [phone] 04/16/2014, 10:18 AM

## 2014-04-16 NOTE — Progress Notes (Signed)
PT Cancellation Note  Patient Details Name: Matthew Beard MRN: [id number] DOB: [birth year]   Cancelled Treatment:    Reason Eval/Treat Not Completed: Patient at procedure or test/unavailable;Other (comment) (pt. currently in HD)   [name] 04/16/2014, 4:57 PM Weldon Picking PT Acute Rehab Services [phone] Beeper [phone]

## 2014-04-16 NOTE — Progress Notes (Signed)
SLP Cancellation Note  Patient Details Name: Matthew Beard MRN: [id number] DOB: [birth year]   Cancelled treatment:       Reason Eval/Treat Not Completed: Patient at procedure or test/unavailable. Pt at HD, will continue to follow as able.    Maxcine Ham, M.A. CCC-SLP [phone]  Maxcine Ham 04/16/2014, 4:47 PM

## 2014-04-16 NOTE — Progress Notes (Signed)
   [facility] Progress Note  Assessment/Plan: 1 Acute CVA - left temporal lobe, hemorrhagic /Rehab hopefully can transfer to HD 2 HTN/ vol excess-severe HTN On admit to moderate HTN And volume Has been down 11 kg/ also on 5 BP meds but think volume control is primary issue. Lowest weight now is 67 kg and probably can be lowered even further / fu wts and bp today. BP elevated today - for HD; HD again tomorrow 3 ESRD MWF @ [facility], [facility] in Hosp=Off schedule sec to him refusing mon and then had inappropriate behavior yesterday biting staff; HD rescheduled for today - use EMLA on AVF and back on schedule again tomorrow  4 Anemia Hb 10.2 s/p1u RBC transfusion 5/21. on aranesp 200/wk Thursday - follow - if Hgb remains up, titrate Aranesp down next week 5 HPTH Ca+ 8.1. Phos 3.6 renagel on hold- also not on hectrol / recheck labs today; resume hectorol at half previous dose starting 5/30 iPTH last 629 6 Nutrition- alb 2.6 dsy diet. Multivit, supplements  7 NICM EF 25-30%  8 Dispo- to inpatient rehab noted per rehab needs to be compliant with HD txs before accepted/ insurance issues pending   [name], [name] [facility] Beeper [phone] 04/16/2014,12:02 PM  LOS: 18 days   Subjective:   non responsive to my questions  Objective Filed Vitals:   04/16/14 0227 04/16/14 0526 04/16/14 0930 04/16/14 1000  BP: 177/93 190/113 208/128 194/120  Pulse: 78 78 91 83  Temp: 98.8 F (37.1 C) 98.7 F (37.1 C)  98.3 F (36.8 C)  TempSrc: Oral Oral  Oral  Resp: 18 17  18   Height:      Weight:      SpO2: 96% 97%  96%   Physical Exam General: awake, will not answer my questions Heart: RRR loud Lungs: no wheezes or rales Abdomen: soft NT Extremities: no sig LE edema Dialysis Access: left AVF + bruit Catheter - large volume amber urine in bag  [facility]  4h 79kg 2/2.25 Bath 300/800 Heparin none due to bleed (was 8000) LUA AVF  Hectorol 4 Aranesp 100 Q  Wed Venofer 100/hd thru 5/20  Last tsat 12%   Additional Objective Labs: Basic Metabolic Panel: No results found for this basename: NA, K, CL, CO2, GLUCOSE, BUN, CREATININE, CALCIUM, ALB, PHOS,  in the last 168 hours Liver Function Tests:  Recent Labs Lab 04/10/14 0414  ALBUMIN 2.6*  CBC:  Recent Labs Lab 04/10/14 0414 04/11/14 0716 04/13/14 1207  WBC 7.4 7.4 7.0  HGB 9.6* 9.9* 10.2*  HCT 31.0* 30.9* 31.9*  MCV 94.8 93.6 93.8  PLT 52* 95* 185   Recent Labs Lab 04/13/14 1649  GLUCAP 78  Medications:   . amLODipine  10 mg Oral Daily  . carvedilol  25 mg Oral BID WC  . cloNIDine  0.3 mg Oral TID  . darbepoetin (ARANESP) injection - DIALYSIS  200 mcg Intravenous Q Thu-HD  . feeding supplement (ENSURE)  1 Container Oral BID  . feeding supplement (NEPRO CARB STEADY)  237 mL Oral Daily  . hydrALAZINE  100 mg Oral 3 times per day  . isosorbide mononitrate  30 mg Oral Daily  . levETIRAcetam  500 mg Oral BID  . lisinopril  10 mg Oral BID  . multivitamin  1 tablet Oral QHS  . phenytoin  200 mg Oral BID

## 2014-04-16 NOTE — Progress Notes (Signed)
Rehab admissions - I continue to follow pt's case. We have concerns about pt's non-compliant behaviors with dialysis and the impact that would have on his rehab stay/participation and overall medical stability.  Per Rn note yesterday, pt became agitated and started screaming with aggressive UE movements when dialysis site became infiltrated yesterday. Dialysis had to be stopped at that time and pt was sent back to his room.  Latest MD note is considering pysch consult and further discussion with neuro.  At this time, pt is not displaying consistent cooperative behaviors with his dialysis which will have direct impact on his participation and medical stability for inpatient rehab.  Pt must demonstrate consistent cooperation with HD before we would consider CIR stay. At this time, skilled nursing may likely need to be pursued.  I will continue to follow pt's case. Discussed case with rehab PA as well. Please call me with any questions.   Thanks.  Juliann Mule, PT Rehabilitation Admissions Coordinator [phone]

## 2014-04-16 NOTE — Progress Notes (Signed)
I have seen and examined this patient and agree with plan as outlined by M. Doran Durand, PA-C.  IF he cont to be abusive would ask Psych and/or Neuro to comment on his competency because he is not able to go to outpt HD if he is going to be a threat to the nursing staff. Jomarie Longs A Latangela Mccomas,MD 04/16/2014 1:22 PM

## 2014-04-16 NOTE — Progress Notes (Signed)
Patient ID: Matthew Beard, male   DOB: 1976-01-03, 38 y.o.   MRN: [id number] [facility] PROGRESS NOTE  HENDRICKS SCHWANDT KOE:[id number] DOB: 27-Jun-1976 DOA: 03/29/2014 PCP: [name], MD  Brief narrative: 38 year-old [place] American male with past medical history of end-stage renal disease, noncompliant with dialysis, history of hypertension, nonischemic cardiomyopathy and secondary congestive heart failure who was brought into the ED 03/29/2014 status post fall at home and having a seizure. He was found to have blood pressures as high as 249/144 and a head CT revealed a small focal hemorrhage at the junction of the left parietal and temporal lobes. Patient was intubated for protection of airway and agitation. Per neurology, recommendation was to keep SBP in 120-160 range but this proved to be difficult as he required multiple medications for BP control. He was put on Keppra. MRI brain done 03/31/2014 showed a 1.8 acute left temporal lobe bleed with multiple microinfarcts and bleed. In addition, pt was thought to have aspiration pneumonia and has completed abx treatments on 04/05/2014. 2 D ECHO done noted drop in ejection fraction from 50% down to 25% as well as global hypokinesis. Cardiology was consulted and felt that hypertension was contributory. Palliative care was consulted and met with family on 04/02/2014. Pt was extubated on 04/03/14. HD was resumed on 04/05/14.   Assessment/Plan:   Principal Problem:  ICH (intracerebral hemorrhage)/acute hemorrhagic CVA   Stabilized.  Discussed with Dr Leonie Man patient behavior. Will start Seroquel.   Will repeat CT head.   Dysphagia  Dysphagia 3 is current diet  Uncontrolled hypertension  Continue norvasc 10 mg daily, coreg 25 mg PO BID, clonidine 0.3 mg PO TID, hydralazine 100 mg pO Q 8 hours, imdur 30 mg daily and lisinopril 10 mg PO BID  IV PRN hydralazine    Anemia in chronic kidney disease  Status post 1 unit PRBC transfusion on 5/21.  Hemoglobin stable. Continue Aranesp.   Thrombocytopenia  Platelets are now WNL  Cardiomyopathy  Most recent 2 D ECHO in 03/2014 showed EHF 25-30%  ESRD  HD per renal schedule  Acute respiratory failure with hypoxia / Aspiration pneumonia   Resolved. Secondary to aspiration pneumonia  Convulsions/seizures   On Keppra.  Acute encephalopathy  slowly improving   Agitation at times. Discussed with Dr Leonie Man, will start Seroquel.   DVT prophylaxis: SCD's bilaterally   Code Status: Full code  Family Communication: Family not at the bedside  Disposition Plan: to SNF or CIR  Consultants:  Critical care  Nephrology  Neurology  Cardiology  Palliative care Procedures:  ETT 5/12 >>>5/15  Rt IJ Trialysis cath 03/30/14 >> 5/19  Echocardiogram done 5/12: Decreased ejection fraction of 50-51%, diastolic flattening, moderate dilatation of right and left atrium and ventricles. Severe LVH Antibiotics:  Vanc 5/11 >>5/11  Maxipime 5/11 >>>5/12  Imipenem 5/12 >>5/13  Levaquin 5/13 >> 5/18  Elmarie Shiley, MD  [facility] Pager [phone]  If 7PM-7AM, please contact night-coverage [email or web address] Password [email or web address] 04/16/2014, 4:14 PM   LOS: 18 days    HPI/Subjective: No responding to questions. Alert, flat affect, aphasic.   Objective: Filed Vitals:   04/16/14 0227 04/16/14 0526 04/16/14 0930 04/16/14 1000  BP: 177/93 190/113 208/128 194/120  Pulse: 78 78 91 83  Temp: 98.8 F (37.1 C) 98.7 F (37.1 C)  98.3 F (36.8 C)  TempSrc: Oral Oral  Oral  Resp: 18 17  18   Height:      Weight:      SpO2: 96%  97%  96%    Intake/Output Summary (Last 24 hours) at 04/16/14 1614 Last data filed at 04/16/14 0849  Gross per 24 hour  Intake    600 ml  Output    350 ml  Net    250 ml    Exam:   General:  no acute distress  Cardiovascular: Regular rate and rhythm, S1/S2 appreciated, SEM appreciated 2/6  Respiratory: Clear to auscultation bilaterally, no wheezing, no  crackles, no rhonchi  Abdomen: Soft, non tender, non distended, bowel sounds present, no guarding  Extremities: trace LE edema, pulses DP and PT palpable bilaterally   Data Reviewed: Basic Metabolic Panel: No results found for this basename: NA, K, CL, CO2, GLUCOSE, BUN, CREATININE, CALCIUM, MG, PHOS,  in the last 168 hours Liver Function Tests:  Recent Labs Lab 04/10/14 0414  ALBUMIN 2.6*   No results found for this basename: LIPASE, AMYLASE,  in the last 168 hours No results found for this basename: AMMONIA,  in the last 168 hours CBC:  Recent Labs Lab 04/10/14 0414 04/11/14 0716 04/13/14 1207  WBC 7.4 7.4 7.0  HGB 9.6* 9.9* 10.2*  HCT 31.0* 30.9* 31.9*  MCV 94.8 93.6 93.8  PLT 52* 95* 185   Cardiac Enzymes: No results found for this basename: CKTOTAL, CKMB, CKMBINDEX, TROPONINI,  in the last 168 hours BNP: No components found with this basename: POCBNP,  CBG:  Recent Labs Lab 04/13/14 1649  GLUCAP 78    No results found for this or any previous visit (from the past 240 hour(s)).   Studies: No results found.  Scheduled Meds: . amLODipine  10 mg Oral Daily  . carvedilol  25 mg Oral BID WC  . cloNIDine  0.3 mg Oral TID  . darbepoetin (ARANESP) injection - DIALYSIS  200 mcg Intravenous Q Thu-HD  . feeding supplement (ENSURE)  1 Container Oral BID  . hydrALAZINE  100 mg Oral 3 times per day  . isosorbide mononitrate  30 mg Oral Daily  . levETIRAcetam  500 mg Oral BID  . lisinopril  10 mg Oral BID  . multivitamin  1 tablet Oral QHS  . phenytoin  200 mg Oral BID

## 2014-04-17 DIAGNOSIS — Z008 Encounter for other general examination: Secondary | ICD-10-CM

## 2014-04-17 DIAGNOSIS — IMO0002 Reserved for concepts with insufficient information to code with codable children: Secondary | ICD-10-CM

## 2014-04-17 LAB — RENAL FUNCTION PANEL
Albumin: 2.7 g/dL — ABNORMAL LOW (ref 3.5–5.2)
BUN: 60 mg/dL — ABNORMAL HIGH (ref 6–23)
CALCIUM: 8.5 mg/dL (ref 8.4–10.5)
CO2: 21 mEq/L (ref 19–32)
Chloride: 106 mEq/L (ref 96–112)
Creatinine, Ser: 6.99 mg/dL — ABNORMAL HIGH (ref 0.50–1.35)
GFR calc Af Amer: 10 mL/min — ABNORMAL LOW (ref >90)
GFR, EST NON AFRICAN AMERICAN: 9 mL/min — AB (ref >90)
Glucose, Bld: 83 mg/dL (ref 70–99)
POTASSIUM: 6 meq/L — AB (ref 3.7–5.3)
Phosphorus: 6.7 mg/dL — ABNORMAL HIGH (ref 2.3–4.6)
Sodium: 143 mEq/L (ref 137–147)

## 2014-04-17 MED ORDER — QUETIAPINE FUMARATE 25 MG PO TABS
25.0000 mg | ORAL_TABLET | Freq: Two times a day (BID) | ORAL | Status: DC
  Administered 2014-04-17 – 2014-04-21 (×9): 25 mg via ORAL
  Filled 2014-04-17 (×11): qty 1

## 2014-04-17 MED ORDER — SODIUM POLYSTYRENE SULFONATE 15 GM/60ML PO SUSP
30.0000 g | Freq: Once | ORAL | Status: AC
  Administered 2014-04-17: 30 g via ORAL
  Filled 2014-04-17: qty 120

## 2014-04-17 NOTE — Progress Notes (Signed)
Subjective: No verbal output. There was no attempt tone for the patient to communicate, otherwise. He did follow some simple commands however with neurological exam. Patient has exhibited significant behavioral issues including being combative towards nursing staff and hemodialysis personnel.  Objective: Current vital signs: BP 174/96  Pulse 76  Temp(Src) 98.3 F (36.8 C) (Oral)  Resp 17  Ht 5\' 10"  (1.778 m)  Wt 66.7 kg (147 lb 0.8 oz)  BMI 21.10 kg/m2  SpO2 95%  Neurologic Exam: Patient was awake and alert and noncommunicative. He was moderately cooperative with examination. Pupils were equal and reacted normally to light. Extraocular movements were intact with right to left lateral gaze with visual tracking. No facial weakness was noted. Motor exam showed drift of right upper and lower extremities as well as reduced grip strength of his right hand compared to the left. Muscle tone was normal throughout. Deep tendon reflexes were slightly greater on the right compared to left. Plantar responses were flexor bilaterally. Responses to sensory testing unreliable.  CT scan of head without contrast: 29 2015 IMPRESSION:  There is a 12 x 12 mm intraparenchymal hematoma in the left temporal  lobe with mild surrounding vasogenic edema which is improved  compared with the prior examination of 03/30/2014.  There are 2 foci of punctate hyperdensity in the left posterior  parietal lobe consistent with tiny foci of hemorrhage. No evidence  of hydrocephalus or impending herniation.   Medications: I have reviewed the patient's current medications.  Assessment/Plan: 38 year old male with acute hemorrhagic left posterior temporal stroke on 03/29/2014 with residual aphasia and mild right hemiparesis, now with new small areas of hemorrhage involving the left posterior parietal region.  Recommendations: 1. No further workup is indicated regarding stroke risk assessment. I will make stroke team  were new findings of small hemorrhagic lesions involving left parietal region. 2. Management of behavioral control issues and determination of competency for continued management with inpatient rehabilitation and hemodialysis per psychiatry service. 3. Continue physical and occupational therapy as well as speech therapy as tolerated.  C.R. Roseanne Reno, MD [facility] [phone]  04/17/2014  3:26 PM

## 2014-04-17 NOTE — Consult Note (Signed)
[facility] Face-to-Face Psychiatry Consult   Reason for Consult: agitation, capacity for decision making Referring Physician:  Dr. Imagene Riches is an 38 y.o. male. Total Time spent with patient: 45 minutes  Assessment: AXIS I:  agitation secondary to cerebral hemmorghage AXIS II:  Deferred AXIS III:   Past Medical History  Diagnosis Date  . Hypertension   . CKD (chronic kidney disease), stage IV   . Normocytic anemia   . Pericardial effusion     Remote hx  . Cerebrovascular disease     Chronic lacunar infarcts  . NICM (nonischemic cardiomyopathy)   . CHF (congestive heart failure)   . Shortness of breath    AXIS IV:  other psychosocial or environmental problems AXIS V:  11-20 some danger of hurting self or others possible OR occasionally fails to maintain minimal personal hygiene OR gross impairment in communication  Plan:  Patient does not meet criteria for psychiatric inpatient admission.  Subjective:   Matthew Beard is a 38 y.o. male patient admitted with parenchymal hemorrhage at the junction of the left temporal and left parietal lobes. He also has a past medical history significant for hypertension, CK D., stage IV on hemodialysis, chronic congestive heart failure poor appearance to treatment.  The patient was seen but was unable to speak and is totally aphasic. He is feeding himself. According to speech therapy note  he is able to follow directions and commands but has diminished understanding and obviously very poor output. Primary physician Dr.Regalado was was called and she stated the patient can become quite agitated and is even bitten the staff during attempts to provide dialysis. He is become agitated with other treatment modalities such as OT and PT. Prior to admission and prior to a cerebral hemorrhage he was poorly compliant with treatment.  Neurology was informally consulted and suggested that low-dose Seroquel to help calm his behavior. This was just started  last night. Impossible to obtain any history or mental status evaluation due to his aphasia  HPI:  See above HPI Elements:   Location:  global. Quality:  severe. Severity:  severe. Timing:  2 weeks. Duration:  2 weeks. Context:  hemorrhagic stroke.  Past Psychiatric History: Past Medical History  Diagnosis Date  . Hypertension   . CKD (chronic kidney disease), stage IV   . Normocytic anemia   . Pericardial effusion     Remote hx  . Cerebrovascular disease     Chronic lacunar infarcts  . NICM (nonischemic cardiomyopathy)   . CHF (congestive heart failure)   . Shortness of breath     reports that he has never smoked. He has never used smokeless tobacco. He reports that he does not drink alcohol or use illicit drugs. Family History  Problem Relation Age of Onset  . Hypertension Mother   . Stroke Mother   . Heart disease Mother     Heart Disease before age 71  . Hypertension Father   . Stroke Father   . Heart disease Father     Heart Disease before age 8  . Hypertension Brother      Living Arrangements: Spouse/significant other   Abuse/Neglect [facility]) Physical Abuse: Denies Verbal Abuse: Denies Sexual Abuse: Denies Allergies:   Allergies  Allergen Reactions  . Amoxicillin Hives    Objective: Blood pressure 174/96, pulse 76, temperature 98.3 F (36.8 C), temperature source Oral, resp. rate 17, height _0  (1.778 m), weight 147 lb 0.8 oz (66.7 kg), SpO2 95.00%.Body mass index is  21.1 kg/(m^2). Results for orders placed during the hospital encounter of 03/29/14 (from the past 72 hour(s))  RENAL FUNCTION PANEL     Status: Abnormal   Collection Time    04/17/14  9:30 AM      Result Value Ref Range   Sodium 143  137 - 147 mEq/L   Potassium 6.0 (*) 3.7 - 5.3 mEq/L   Chloride 106  96 - 112 mEq/L   CO2 21  19 - 32 mEq/L   Glucose, Bld 83  70 - 99 mg/dL   BUN 60 (*) 6 - 23 mg/dL   Creatinine, Ser 6.99 (*) 0.50 - 1.35 mg/dL   Calcium 8.5  8.4 - 10.5 mg/dL    Phosphorus 6.7 (*) 2.3 - 4.6 mg/dL   Albumin 2.7 (*) 3.5 - 5.2 g/dL   GFR calc non Af Amer 9 (*) >90 mL/min   GFR calc Af Amer 10 (*) >90 mL/min   Comment: (NOTE)     The eGFR has been calculated using the CKD EPI equation.     This calculation has not been validated in all clinical situations.     eGFR's persistently <90 mL/min signify possible Chronic Kidney     Disease.   Labs are reviewed and are pertinent for CKD  Current Facility-Administered Medications  Medication Dose Route Frequency Provider Last Rate Last Dose  . 0.9 %  sodium chloride infusion  250 mL Intravenous PRN Kara Mead V, MD      . 0.9 %  sodium chloride infusion  100 mL Intravenous PRN Myriam Jacobson, PA-C      . 0.9 %  sodium chloride infusion  100 mL Intravenous PRN Myriam Jacobson, PA-C      . amLODipine (NORVASC) tablet 10 mg  10 mg Oral Daily Wilhelmina Mcardle, MD   10 mg at 04/17/14 0955  . carvedilol (COREG) tablet 25 mg  25 mg Oral BID WC Annita Brod, MD   25 mg at 04/17/14 0845  . chlorproMAZINE (THORAZINE) tablet 10 mg  10 mg Oral TID PRN Annita Brod, MD   10 mg at 04/07/14 1048  . cloNIDine (CATAPRES) tablet 0.3 mg  0.3 mg Oral TID Annita Brod, MD   0.3 mg at 04/17/14 0954  . darbepoetin (ARANESP) injection 200 mcg  200 mcg Intravenous Q Thu-HD Annita Brod, MD   200 mcg at 04/08/14 1605  . diphenhydrAMINE (BENADRYL) capsule 25 mg  25 mg Oral Q6H PRN Elsie Stain, MD   25 mg at 04/06/14 0251  . doxercalciferol (HECTOROL) injection 2 mcg  2 mcg Intravenous Q T,Th,Sa-HD Myriam Jacobson, PA-C      . feeding supplement (ENSURE) (ENSURE) pudding 1 Container  1 Container Oral BID Dalene Carrow, RD   1 Container at 04/12/14 1454  . feeding supplement (NEPRO CARB STEADY) liquid 237 mL  237 mL Oral Daily Erlene Quan, RD   237 mL at 04/17/14 0954  . feeding supplement (NEPRO CARB STEADY) liquid 237 mL  237 mL Oral PRN Myriam Jacobson, PA-C      . fentaNYL  (SUBLIMAZE) injection 25 mcg  25 mcg Intravenous Q2H PRN Wilhelmina Mcardle, MD   25 mcg at 04/06/14 1936  . food thickener (THICK IT) powder   Oral PRN Annita Brod, MD      . heparin injection 1,000 Units  1,000 Units Dialysis PRN Myriam Jacobson, PA-C      . hydrALAZINE (  APRESOLINE) injection 10 mg  10 mg Intravenous Q4H PRN Raylene Miyamoto, MD   10 mg at 04/08/14 1606  . hydrALAZINE (APRESOLINE) tablet 100 mg  100 mg Oral 3 times per day Wilhelmina Mcardle, MD   100 mg at 04/16/14 2139  . isosorbide mononitrate (IMDUR) 24 hr tablet 30 mg  30 mg Oral Daily Annita Brod, MD   30 mg at 04/17/14 0954  . labetalol (NORMODYNE,TRANDATE) injection 20 mg  20 mg Intravenous Q2H PRN Elsie Stain, MD   20 mg at 04/07/14 [phone]  . levETIRAcetam (KEPPRA) 100 MG/ML solution 500 mg  500 mg Oral BID Modena Jansky, MD   500 mg at 04/17/14 0955  . lidocaine (PF) (XYLOCAINE) 1 % injection 5 mL  5 mL Intradermal PRN Myriam Jacobson, PA-C      . lidocaine-prilocaine (EMLA) cream 1 application  1 application Topical PRN Myriam Jacobson, PA-C      . lidocaine-prilocaine (EMLA) cream   Topical Q dialysis Donetta Potts, MD      . lisinopril (PRINIVIL,ZESTRIL) tablet 10 mg  10 mg Oral BID Sol Blazing, MD   10 mg at 04/17/14 0954  . multivitamin (RENA-VIT) tablet 1 tablet  1 tablet Oral QHS Marlena Clipper, NP   1 tablet at 04/16/14 2139  . pentafluoroprop-tetrafluoroeth (GEBAUERS) aerosol 1 application  1 application Topical PRN Myriam Jacobson, PA-C      . phenytoin (DILANTIN) ER capsule 200 mg  200 mg Oral BID Ace Gins, RPH   200 mg at 04/17/14 0954  . QUEtiapine (SEROQUEL) tablet 12.5 mg  12.5 mg Oral QHS Belkys A Regalado, MD   12.5 mg at 04/16/14 2140  . sodium polystyrene (KAYEXALATE) 15 GM/60ML suspension 30 g  30 g Oral Once Donetta Potts, MD        Psychiatric Specialty Exam:     Blood pressure 174/96, pulse 76, temperature 98.3 F (36.8 C), temperature source  Oral, resp. rate 17, height _0  (1.778 m), weight 147 lb 0.8 oz (66.7 kg), SpO2 95.00%.Body mass index is 21.1 kg/(m^2).  General Appearance: Disheveled in bed, feeding himself with his right hand, scratching and picking at his hair   Eye Contact::  Absent  Speech:  Unable to speak secondary to aphasia  Volume:  n/a  Mood:  Anxious  Affect:  Constricted  Thought Process: Cannot assess   Orientation:  Cannot assess   Thought Content: Cannot assess   Suicidal Thoughts: Cannot assess   Homicidal Thoughts:  Cannot assess   Memory: Cannot assess   Judgement:  Poor  Insight:  Lacking  Psychomotor Activity:  Mannerisms  Concentration:  Cannot assess   Recall: Cannot assess   Fund of Knowledge: Cannot assess   Language: none  Akathisia:  No  Handed:  Right  AIMS (if indicated):     Assets:  Social Support  Sleep:      Musculoskeletal: Strength & Muscle Tone: within normal limits Gait & Station: Not assessed Patient leans: N/A  Treatment Plan Summary: Daily contact with patient to assess and evaluate symptoms and progress in treatment Medication management Patient's agitation is probably secondary both to his recent stroke and his frustration and being unable to communicate with others. Increase in Seroquel may be helpful but in need to be done slowly due to his renal failure.  The patient is agitated and uncooperative with treatment and does not have the current capacity to make medical decisions.  I would recommend unit social worker contact family regarding this. Transfer to the rehabilitation setting should be helpful as well as a neurology consult Levonne Spiller 04/17/2014 2:05 PM

## 2014-04-17 NOTE — Progress Notes (Signed)
Patient ID: Matthew Beard, male   DOB: Dec 08, 1975, 38 y.o.   MRN: [id number]  [facility] Progress Note    Subjective:   nonverbal   Objective:   BP 174/96  Pulse 70  Temp(Src) 97.4 F (36.3 C) (Oral)  Resp 16  Ht 5\' 10"  (1.778 m)  Wt 66.7 kg (147 lb 0.8 oz)  BMI 21.10 kg/m2  SpO2 93%  Intake/Output: I/O last 3 completed shifts: In: 1200 [P.O.:1200] Out: 950 [Urine:950]   Intake/Output this shift:    Weight change: -4.5 kg (-9 lb 14.7 oz)  Physical Exam: Gen:wd wn aam in nad CVS:no rub Resp:cta HFW:YOVZCH Ext: L AVF +T/B, no edema  Labs: BMET  Recent Labs Lab 04/17/14 0930  NA 143  K 6.0*  CL 106  CO2 21  GLUCOSE 83  BUN 60*  CREATININE 6.99*  ALBUMIN 2.7*  CALCIUM 8.5  PHOS 6.7*   CBC  Recent Labs Lab 04/11/14 0716 04/13/14 1207  WBC 7.4 7.0  HGB 9.9* 10.2*  HCT 30.9* 31.9*  MCV 93.6 93.8  PLT 95* 185    @IMGRELPRIORS @ Medications:    . amLODipine  10 mg Oral Daily  . carvedilol  25 mg Oral BID WC  . cloNIDine  0.3 mg Oral TID  . darbepoetin (ARANESP) injection - DIALYSIS  200 mcg Intravenous Q Thu-HD  . doxercalciferol  2 mcg Intravenous Q T,Th,Sa-HD  . feeding supplement (ENSURE)  1 Container Oral BID  . feeding supplement (NEPRO CARB STEADY)  237 mL Oral Daily  . hydrALAZINE  100 mg Oral 3 times per day  . isosorbide mononitrate  30 mg Oral Daily  . levETIRAcetam  500 mg Oral BID  . lisinopril  10 mg Oral BID  . multivitamin  1 tablet Oral QHS  . phenytoin  200 mg Oral BID  . QUEtiapine  12.5 mg Oral QHS     Assessment/Plan:  1. Acute CVA - left temporal lobe, hemorrhagic [name] reveals new lesions.  MS has not improved and he remains withdrawn and nonverbal.  2. HTN/ vol excess-severe HTN On admit to moderate HTN And volume Has been down 11 kg/ also on 5 BP meds but think volume control is primary issue. Lowest weight now is 67 kg and probably can be lowered even further / fu wts and bp today. BP elevated  today - for HD; HD again tomorrow  3. ESRD MWF @ [facility], TTS in Hosp=Off schedule sec to him refusing mon and then had inappropriate behavior yesterday biting staff; HD rescheduled for today - use EMLA on AVF and back on schedule again tomorrow  1. 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  His noncompliance predates this neurologic event. 4. Anemia Hb 10.2 s/p1u RBC transfusion 5/21. on aranesp 200/wk Thursday - follow - if Hgb remains up, titrate Aranesp down next week  5. Hyperkalemia- will treat with kayexalate 6. HPTH Ca+ 8.1. Phos 3.6 renagel on hold- also not on hectrol / recheck labs today; resume hectorol at half previous dose starting 5/30 iPTH last 629  7. Nutrition- alb 2.6 dsy diet. Multivit, supplements  8. NICM EF 25-30%  9. Dispo- issues with tx to inpatient rehab noted per rehab needs to be compliant with HD txs before accepted/ insurance issues pending.  He has not progressed and continues to be a danger to himself and the staff  during dialysis.     Matthew NordmannJoseph A Beard [place] 04/17/2014, 11:36 AM

## 2014-04-17 NOTE — Progress Notes (Signed)
Patient ID: Matthew Beard, male   DOB: [birth year], 38 y.o.   MRN: [id number] [facility] PROGRESS NOTE  Matthew Beard GYI:[id number] DOB: 1976-05-17 DOA: 03/29/2014 PCP: [name], MD  Brief narrative: 38 year-old [place] American male with past medical history of end-stage renal disease, noncompliant with dialysis, history of hypertension, nonischemic cardiomyopathy and secondary congestive heart failure who was brought into the ED 03/29/2014 status post fall at home and having a seizure. He was found to have blood pressures as high as 249/144 and a head CT revealed a small focal hemorrhage at the junction of the left parietal and temporal lobes. Patient was intubated for protection of airway and agitation. Per neurology, recommendation was to keep SBP in 120-160 range but this proved to be difficult as he required multiple medications for BP control. He was put on Keppra. MRI brain done 03/31/2014 showed a 1.8 acute left temporal lobe bleed with multiple microinfarcts and bleed. In addition, pt was thought to have aspiration pneumonia and has completed abx treatments on 04/05/2014. 2 D ECHO done noted drop in ejection fraction from 50% down to 25% as well as global hypokinesis. Cardiology was consulted and felt that hypertension was contributory. Palliative care was consulted and met with family on 04/02/2014. Pt was extubated on 04/03/14. HD was resumed on 04/05/14.   Assessment/Plan:   Principal Problem:  ICH (intracerebral hemorrhage)/acute hemorrhagic CVA  Stabilized.  Started on low dose seraquel 5-29.   Concern with behavior during dialysis, prognosis of condition.   Repeated CT head showed 2 areas of small hemorrhage,new ?Marland Kitchen Neurology re-consulted. For evaluation and for prognosis of recovery.   Psych consulted for capacity evaluation and behaviors problems.    Dysphagia  Dysphagia 3 is current diet  Uncontrolled hypertension  Continue norvasc 10 mg daily, coreg 25 mg PO  BID, clonidine 0.3 mg PO TID, hydralazine 100 mg pO Q 8 hours, imdur 30 mg daily and lisinopril 10 mg PO BID  IV PRN hydralazine   Will hold lisinopril today due to hyperkalemia.   Has received most of his medications today.    Anemia in chronic kidney disease  Status post 1 unit PRBC transfusion on 5/21. Hemoglobin stable. Continue Aranesp.   Thrombocytopenia  Platelets are now WNL  Cardiomyopathy  Most recent 2 D ECHO in 03/2014 showed EHF 25-30%  ESRD  HD per renal schedule  Hyperkalemia; Kayexalate ordered.   Acute respiratory failure with hypoxia / Aspiration pneumonia   Resolved. Secondary to aspiration pneumonia   Convulsions/seizures   On Keppra.   Acute encephalopathy  Agitation at times. Discussed with Dr Leonie Man, will start Seroquel.    DVT prophylaxis: SCD's bilaterally   Code Status: Full code  Family Communication: Family not at the bedside  Disposition Plan: to SNF or CIR  Consultants:  Critical care  Nephrology  Neurology  Cardiology  Palliative care Procedures:  ETT 5/12 >>>5/15  Rt IJ Trialysis cath 03/30/14 >> 5/19  Echocardiogram done 5/12: Decreased ejection fraction of 35-00%, diastolic flattening, moderate dilatation of right and left atrium and ventricles. Severe LVH Antibiotics:  Vanc 5/11 >>5/11  Maxipime 5/11 >>>5/12  Imipenem 5/12 >>5/13  Levaquin 5/13 >> 5/18  Elmarie Shiley, MD  [facility] Pager [phone]  If 7PM-7AM, please contact night-coverage [email or web address] Password [email or web address] 04/17/2014, 3:02 PM   LOS: 19 days    HPI/Subjective: No responding to questions. Alert, flat affect, aphasic.   Objective: Filed Vitals:   04/16/14 1809 04/16/14 2055 04/17/14 0500 04/17/14 1119  BP: 206/117 195/116 172/93 174/96  Pulse: 92 86 70 76  Temp: 98.5 F (36.9 C) 99.2 F (37.3 C) 97.4 F (36.3 C) 98.3 F (36.8 C)  TempSrc: Oral Oral Oral Oral  Resp: 16 18 16 17   Height:      Weight:  66.7 kg (147 lb 0.8 oz)     SpO2: 95% 93% 93% 95%    Intake/Output Summary (Last 24 hours) at 04/17/14 1502 Last data filed at 04/17/14 1300  Gross per 24 hour  Intake    695 ml  Output   1250 ml  Net   -555 ml    Exam:   General:  no acute distress  Cardiovascular: Regular rate and rhythm, S1/S2 appreciated, SEM appreciated 2/6  Respiratory: Clear to auscultation bilaterally, no wheezing, no crackles, no rhonchi  Abdomen: Soft, non tender, non distended, bowel sounds present, no guarding  Extremities: trace LE edema, pulses DP and PT palpable bilaterally   Data Reviewed: Basic Metabolic Panel:  Recent Labs Lab 04/17/14 0930  NA 143  K 6.0*  CL 106  CO2 21  GLUCOSE 83  BUN 60*  CREATININE 6.99*  CALCIUM 8.5  PHOS 6.7*   Liver Function Tests:  Recent Labs Lab 04/17/14 0930  ALBUMIN 2.7*   No results found for this basename: LIPASE, AMYLASE,  in the last 168 hours No results found for this basename: AMMONIA,  in the last 168 hours CBC:  Recent Labs Lab 04/11/14 0716 04/13/14 1207  WBC 7.4 7.0  HGB 9.9* 10.2*  HCT 30.9* 31.9*  MCV 93.6 93.8  PLT 95* 185   Cardiac Enzymes: No results found for this basename: CKTOTAL, CKMB, CKMBINDEX, TROPONINI,  in the last 168 hours BNP: No components found with this basename: POCBNP,  CBG:  Recent Labs Lab 04/13/14 1649  GLUCAP 78    No results found for this or any previous visit (from the past 240 hour(s)).   Studies: Ct Head Wo Contrast  04/16/2014   CLINICAL DATA:  Follow-up intracranial hemorrhage  EXAM: CT HEAD WITHOUT CONTRAST  TECHNIQUE: Contiguous axial images were obtained from the base of the skull through the vertex without intravenous contrast.  COMPARISON:  None.  FINDINGS: There is a 12 x 12 mm intraparenchymal hematoma in the left temporal lobe with mild surrounding vasogenic edema which is improved compared with the prior examination of 03/30/2014. There are 2 foci of punctate hyperdensity in the left posterior  parietal lobe consistent with tiny foci of hemorrhage. There is no evidence of mass effect, midline shift, or extra-axial fluid collections. There is no evidence of a space-occupying lesion . There is no evidence of a cortical-based area of acute infarction. Old right pontine lacunar infarct. There is generalized cerebral atrophy. There is periventricular white matter low attenuation likely secondary to microangiopathy.  The ventricles and sulci are appropriate for the patient's age. The basal cisterns are patent.  Visualized portions of the orbits are unremarkable. The visualized portions of the paranasal sinuses and mastoid air cells are unremarkable. Cerebrovascular atherosclerotic calcifications are noted.  The osseous structures are unremarkable.  IMPRESSION: There is a 12 x 12 mm intraparenchymal hematoma in the left temporal lobe with mild surrounding vasogenic edema which is improved compared with the prior examination of 03/30/2014.  There are 2 foci of punctate hyperdensity in the left posterior parietal lobe consistent with tiny foci of hemorrhage. No evidence of hydrocephalus or impending herniation.   Electronically Signed   By: Kathreen Devoid  On: 04/16/2014 18:06    Scheduled Meds: . amLODipine  10 mg Oral Daily  . carvedilol  25 mg Oral BID WC  . cloNIDine  0.3 mg Oral TID  . darbepoetin (ARANESP) injection - DIALYSIS  200 mcg Intravenous Q Thu-HD  . feeding supplement (ENSURE)  1 Container Oral BID  . hydrALAZINE  100 mg Oral 3 times per day  . isosorbide mononitrate  30 mg Oral Daily  . levETIRAcetam  500 mg Oral BID  . lisinopril  10 mg Oral BID  . multivitamin  1 tablet Oral QHS  . phenytoin  200 mg Oral BID

## 2014-04-18 LAB — RENAL FUNCTION PANEL
Albumin: 2.8 g/dL — ABNORMAL LOW (ref 3.5–5.2)
BUN: 72 mg/dL — ABNORMAL HIGH (ref 6–23)
CO2: 20 mEq/L (ref 19–32)
CREATININE: 7.24 mg/dL — AB (ref 0.50–1.35)
Calcium: 8.3 mg/dL — ABNORMAL LOW (ref 8.4–10.5)
Chloride: 103 mEq/L (ref 96–112)
GFR calc Af Amer: 10 mL/min — ABNORMAL LOW (ref >90)
GFR calc non Af Amer: 9 mL/min — ABNORMAL LOW (ref >90)
Glucose, Bld: 82 mg/dL (ref 70–99)
PHOSPHORUS: 7.2 mg/dL — AB (ref 2.3–4.6)
Potassium: 4.6 mEq/L (ref 3.7–5.3)
Sodium: 142 mEq/L (ref 137–147)

## 2014-04-18 LAB — CBC
HCT: 31.6 % — ABNORMAL LOW (ref 39.0–52.0)
HEMOGLOBIN: 10 g/dL — AB (ref 13.0–17.0)
MCH: 30.1 pg (ref 26.0–34.0)
MCHC: 31.6 g/dL (ref 30.0–36.0)
MCV: 95.2 fL (ref 78.0–100.0)
Platelets: 126 10*3/uL — ABNORMAL LOW (ref 150–400)
RBC: 3.32 MIL/uL — ABNORMAL LOW (ref 4.22–5.81)
RDW: 18.4 % — AB (ref 11.5–15.5)
WBC: 5.5 10*3/uL (ref 4.0–10.5)

## 2014-04-18 MED ORDER — DOXERCALCIFEROL 4 MCG/2ML IV SOLN
2.0000 ug | INTRAVENOUS | Status: DC
  Filled 2014-04-18: qty 2

## 2014-04-18 MED ORDER — LISINOPRIL 10 MG PO TABS
10.0000 mg | ORAL_TABLET | Freq: Two times a day (BID) | ORAL | Status: DC
  Administered 2014-04-18 – 2014-04-19 (×3): 10 mg via ORAL
  Filled 2014-04-18 (×5): qty 1

## 2014-04-18 MED ORDER — DARBEPOETIN ALFA-POLYSORBATE 100 MCG/0.5ML IJ SOLN
100.0000 ug | INTRAMUSCULAR | Status: DC
  Filled 2014-04-18: qty 0.5

## 2014-04-18 MED ORDER — FUROSEMIDE 80 MG PO TABS
80.0000 mg | ORAL_TABLET | Freq: Two times a day (BID) | ORAL | Status: DC
  Administered 2014-04-18 – 2014-04-21 (×7): 80 mg via ORAL
  Filled 2014-04-18 (×9): qty 1

## 2014-04-18 MED ORDER — SEVELAMER CARBONATE 800 MG PO TABS
800.0000 mg | ORAL_TABLET | Freq: Three times a day (TID) | ORAL | Status: DC
  Administered 2014-04-18 – 2014-04-21 (×6): 800 mg via ORAL
  Filled 2014-04-18 (×13): qty 1

## 2014-04-18 NOTE — Progress Notes (Signed)
Patient ID: Matthew Beard, male   DOB: [birth year], 38 y.o.   MRN: [id number] [facility] PROGRESS NOTE  Matthew Beard WNI:[id number] DOB: 11-09-1976 DOA: 03/29/2014 PCP: [name], MD  Brief narrative: 38 year-old [place] American male with past medical history of end-stage renal disease, noncompliant with dialysis, history of hypertension, nonischemic cardiomyopathy and secondary congestive heart failure who was brought into the ED 03/29/2014 status post fall at home and having a seizure. He was found to have blood pressures as high as 249/144 and a head CT revealed a small focal hemorrhage at the junction of the left parietal and temporal lobes. Patient was intubated for protection of airway and agitation. Per neurology, recommendation was to keep SBP in 120-160 range but this proved to be difficult as he required multiple medications for BP control. He was put on Keppra. MRI brain done 03/31/2014 showed a 1.8 acute left temporal lobe bleed with multiple microinfarcts and bleed. In addition, pt was thought to have aspiration pneumonia and has completed abx treatments on 04/05/2014. 2 D ECHO done noted drop in ejection fraction from 50% down to 25% as well as global hypokinesis. Cardiology was consulted and felt that hypertension was contributory. Palliative care was consulted and met with family on 04/02/2014. Pt was extubated on 04/03/14. HD was resumed on 04/05/14.   Assessment/Plan:   Principal Problem:  ICH (intracerebral hemorrhage)/acute hemorrhagic CVA  Stabilized.  Concern with behavior during dialysis, prognosis of condition.   Repeated CT head showed 2 areas of small hemorrhage,new ?Marland Kitchen Neurology re-consulted. For evaluation and for prognosis of recovery.   Appreciate Psych evaluation help with medications. Seroquel increase to 25 mg BID on 5-30.     Dysphagia  Dysphagia 3 is current diet  Uncontrolled hypertension  Continue norvasc 10 mg daily, coreg 25 mg PO BID,  clonidine 0.3 mg PO TID, hydralazine 100 mg pO Q 8 hours, imdur 30 mg daily and lisinopril 10 mg PO BID  IV PRN hydralazine   Will resume lisinopril, K today at 4.6. Started on lasix.    Anemia in chronic kidney disease  Status post 1 unit PRBC transfusion on 5/21. Hemoglobin stable. Continue Aranesp.   Thrombocytopenia  Platelets are now WNL  Cardiomyopathy  Most recent 2 D ECHO in 03/2014 showed EHF 25-30%  ESRD  HD per renal schedule  Hyperkalemia; Kayexalate ordered.   Acute respiratory failure with hypoxia / Aspiration pneumonia   Resolved. Secondary to aspiration pneumonia   Convulsions/seizures   On Keppra.   Acute encephalopathy  Agitation at times. Started on Seroquel. Does increase to 25 mg BID on 5-30. Will see if this help with behavior.    DVT prophylaxis: SCD's bilaterally   Code Status: Full code  Family Communication: Family not at the bedside  Disposition Plan: to SNF or CIR  Consultants:  Critical care  Nephrology  Neurology  Cardiology  Palliative care Procedures:  ETT 5/12 >>>5/15  Rt IJ Trialysis cath 03/30/14 >> 5/19  Echocardiogram done 5/12: Decreased ejection fraction of 38-18%, diastolic flattening, moderate dilatation of right and left atrium and ventricles. Severe LVH Antibiotics:  Vanc 5/11 >>5/11  Maxipime 5/11 >>>5/12  Imipenem 5/12 >>5/13  Levaquin 5/13 >> 5/18  Elmarie Shiley, MD  [facility] Pager [phone]  If 7PM-7AM, please contact night-coverage [email or web address] Password [email or web address] 04/18/2014, 9:03 AM   LOS: 20 days    HPI/Subjective: I told him good morning and he respond good morning. He was feeding himself breakfast.   Objective: Danley Danker  Vitals:   04/17/14 2143 04/17/14 2211 04/18/14 0528 04/18/14 0624  BP: 168/86 173/92 178/109 181/109  Pulse:  83 73 73  Temp:  98.6 F (37 C) 98.2 F (36.8 C) 98 F (36.7 C)  TempSrc:  Oral Oral Oral  Resp:  18 18 18   Height:      Weight:  68.8 kg (151 lb 10.8  oz)    SpO2:  98% 97% 96%    Intake/Output Summary (Last 24 hours) at 04/18/14 1031 Last data filed at 04/17/14 1700  Gross per 24 hour  Intake    335 ml  Output    950 ml  Net   -615 ml    Exam:   General:  no acute distress  Cardiovascular: Regular rate and rhythm, S1/S2 appreciated, SEM appreciated 2/6  Respiratory: Clear to auscultation bilaterally, no wheezing, no crackles, no rhonchi  Abdomen: Soft, non tender, non distended, bowel sounds present, no guarding  Extremities: trace LE edema, pulses DP and PT palpable bilaterally  Neuro; say few words, was feeding himself.    Data Reviewed: Basic Metabolic Panel:  Recent Labs Lab 04/17/14 0930 04/18/14 0505  NA 143 142  K 6.0* 4.6  CL 106 103  CO2 21 20  GLUCOSE 83 82  BUN 60* 72*  CREATININE 6.99* 7.24*  CALCIUM 8.5 8.3*  PHOS 6.7* 7.2*   Liver Function Tests:  Recent Labs Lab 04/17/14 0930 04/18/14 0505  ALBUMIN 2.7* 2.8*   No results found for this basename: LIPASE, AMYLASE,  in the last 168 hours No results found for this basename: AMMONIA,  in the last 168 hours CBC:  Recent Labs Lab 04/13/14 1207 04/18/14 0505  WBC 7.0 5.5  HGB 10.2* 10.0*  HCT 31.9* 31.6*  MCV 93.8 95.2  PLT 185 126*   Cardiac Enzymes: No results found for this basename: CKTOTAL, CKMB, CKMBINDEX, TROPONINI,  in the last 168 hours BNP: No components found with this basename: POCBNP,  CBG:  Recent Labs Lab 04/13/14 1649  GLUCAP 78    No results found for this or any previous visit (from the past 240 hour(s)).   Studies: Ct Head Wo Contrast  04/16/2014   CLINICAL DATA:  Follow-up intracranial hemorrhage  EXAM: CT HEAD WITHOUT CONTRAST  TECHNIQUE: Contiguous axial images were obtained from the base of the skull through the vertex without intravenous contrast.  COMPARISON:  None.  FINDINGS: There is a 12 x 12 mm intraparenchymal hematoma in the left temporal lobe with mild surrounding vasogenic edema which is  improved compared with the prior examination of 03/30/2014. There are 2 foci of punctate hyperdensity in the left posterior parietal lobe consistent with tiny foci of hemorrhage. There is no evidence of mass effect, midline shift, or extra-axial fluid collections. There is no evidence of a space-occupying lesion . There is no evidence of a cortical-based area of acute infarction. Old right pontine lacunar infarct. There is generalized cerebral atrophy. There is periventricular white matter low attenuation likely secondary to microangiopathy.  The ventricles and sulci are appropriate for the patient's age. The basal cisterns are patent.  Visualized portions of the orbits are unremarkable. The visualized portions of the paranasal sinuses and mastoid air cells are unremarkable. Cerebrovascular atherosclerotic calcifications are noted.  The osseous structures are unremarkable.  IMPRESSION: There is a 12 x 12 mm intraparenchymal hematoma in the left temporal lobe with mild surrounding vasogenic edema which is improved compared with the prior examination of 03/30/2014.  There are 2 foci of  punctate hyperdensity in the left posterior parietal lobe consistent with tiny foci of hemorrhage. No evidence of hydrocephalus or impending herniation.   Electronically Signed   By: Kathreen Devoid   On: 04/16/2014 18:06    Scheduled Meds: . amLODipine  10 mg Oral Daily  . carvedilol  25 mg Oral BID WC  . cloNIDine  0.3 mg Oral TID  . darbepoetin (ARANESP) injection - DIALYSIS  200 mcg Intravenous Q Thu-HD  . feeding supplement (ENSURE)  1 Container Oral BID  . hydrALAZINE  100 mg Oral 3 times per day  . isosorbide mononitrate  30 mg Oral Daily  . levETIRAcetam  500 mg Oral BID  . lisinopril  10 mg Oral BID  . multivitamin  1 tablet Oral QHS  . phenytoin  200 mg Oral BID

## 2014-04-18 NOTE — Progress Notes (Signed)
[facility] Progress Note  Assessment/Plan:  1 Acute CVA - left temporal lobe, hemorrhagic /Rehab hopefully can transfer if he can do HD 2 HTN/ vol excess-severe HTN On admit to moderate HTN And volume Has been down 11 kg/ also on 5 BP meds but think volume control is primary issue. Lowest weight now is 67 kg and probably can be lowered even further / fu wts and bp today. BP elevated today - needs volume down with HD; as he is till making urine - will add po lasix 80 bid at least short term to help facilitate volume removal  Urine outpt 600 - 1000 per day. 3 ESRD MWF - last attempted HD Friday - deferred Saturday - K was 6 down to 4.6 after kayexalate; noncooperative with HD biting staff and flailing around  Last recorded UF was 2.6 on 5/26 - had some other degree of HD 5/29 but insignificant - he has been seen by psych and neuro - now on seroquel for several days. Believe we should try HD Monday with EMLA on access pre and if this fails - options are catheter or no HD. Will discuss with Dr. Arrie Aran 4 Anemia Hb 10.2 s/p1u RBC transfusion 5/21. Was on 100/week increased to 200 but did not get 5/28)- follow - NEED to adjust it for when he gets his next HD - I will schedule for Monday but don't think he needs 200, will decrease back to 100 - I think he was ESA naive before starting HD 5 HPTH Ca+ 8.1. Phos 7.2 resume renvelal  1 ac tid; resumed hectorol at half previous dose starting 5/30 - but did not get because he did not get dialysis iPTH last 629  6 Nutrition- alb 2.6 dsy diet. Multivit, supplements  7 NICM EF 25-30%  8 Dispo- to inpatient rehab noted per rehab needs to be compliant with HD txs before accepted/ insurance issues pending   Matthew Beard, Matthew Beard [facility] Beeper [phone] 04/18/2014,11:09 AM  LOS: 20 days   Subjective:   nonverbal  Objective Filed Vitals:   04/17/14 2143 04/17/14 2211 04/18/14 0528 04/18/14 0624  BP: 168/86 173/92 178/109  181/109  Pulse:  83 73 73  Temp:  98.6 F (37 C) 98.2 F (36.8 C) 98 F (36.7 C)  TempSrc:  Oral Oral Oral  Resp:  18 18 18   Height:      Weight:  68.8 kg (151 lb 10.8 oz)    SpO2:  98% 97% 96%   Physical Exam General: awake, alert, nonverbal Heart: RRR bounding Lungs: no wheezes or rales Abdomen: soft NT Extremities: no LE edema Dialysis Access: left AVF + bruit  Dialysis Orders: [facility]  4h 79kg 2/2.25 Bath 300/800 Heparin none due to bleed (was 8000) LUA AVF  Hectorol 4 Aranesp 100 Q Wed Venofer 100/hd thru    Additional Objective Labs: Basic Metabolic Panel:  Recent Labs Lab 04/17/14 0930 04/18/14 0505  NA 143 142  K 6.0* 4.6  CL 106 103  CO2 21 20  GLUCOSE 83 82  BUN 60* 72*  CREATININE 6.99* 7.24*  CALCIUM 8.5 8.3*  PHOS 6.7* 7.2*   Liver Function Tests:  Recent Labs Lab 04/17/14 0930 04/18/14 0505  ALBUMIN 2.7* 2.8*   CBC:  Recent Labs Lab 04/13/14 1207 04/18/14 0505  WBC 7.0 5.5  HGB 10.2* 10.0*  HCT 31.9* 31.6*  MCV 93.8 95.2  PLT 185 126*  Medications:   . amLODipine  10 mg Oral Daily  .  carvedilol  25 mg Oral BID WC  . cloNIDine  0.3 mg Oral TID  . darbepoetin (ARANESP) injection - DIALYSIS  200 mcg Intravenous Q Thu-HD  . doxercalciferol  2 mcg Intravenous Q T,Th,Sa-HD  . feeding supplement (ENSURE)  1 Container Oral BID  . feeding supplement (NEPRO CARB STEADY)  237 mL Oral Daily  . hydrALAZINE  100 mg Oral 3 times per day  . isosorbide mononitrate  30 mg Oral Daily  . levETIRAcetam  500 mg Oral BID  . multivitamin  1 tablet Oral QHS  . phenytoin  200 mg Oral BID  . QUEtiapine  25 mg Oral BID

## 2014-04-18 NOTE — Progress Notes (Signed)
I have seen and examined this patient and agree with plan as outlined by M. Doran Durand, PA-C. Jomarie Longs A Teresina Bugaj,MD 04/18/2014 12:19 PM

## 2014-04-19 LAB — BASIC METABOLIC PANEL
BUN: 84 mg/dL — ABNORMAL HIGH (ref 6–23)
CALCIUM: 8.8 mg/dL (ref 8.4–10.5)
CO2: 17 mEq/L — ABNORMAL LOW (ref 19–32)
Chloride: 103 mEq/L (ref 96–112)
Creatinine, Ser: 7.37 mg/dL — ABNORMAL HIGH (ref 0.50–1.35)
GFR calc Af Amer: 10 mL/min — ABNORMAL LOW (ref >90)
GFR, EST NON AFRICAN AMERICAN: 8 mL/min — AB (ref >90)
GLUCOSE: 96 mg/dL (ref 70–99)
Potassium: 5.2 mEq/L (ref 3.7–5.3)
SODIUM: 140 meq/L (ref 137–147)

## 2014-04-19 LAB — PROTIME-INR
INR: 1.11 (ref 0.00–1.49)
Prothrombin Time: 14.1 seconds (ref 11.6–15.2)

## 2014-04-19 MED ORDER — VANCOMYCIN HCL IN DEXTROSE 1-5 GM/200ML-% IV SOLN
1000.0000 mg | Freq: Once | INTRAVENOUS | Status: AC
  Administered 2014-04-20: 1000 mg via INTRAVENOUS
  Filled 2014-04-19: qty 200

## 2014-04-19 NOTE — Progress Notes (Signed)
Speech Language Pathology Treatment: Dysphagia;Cognitive-Linquistic  Patient Details Name: Matthew Beard MRN: [id number] DOB: [birth year] Today's Date: 04/19/2014 Time: 3419-3790 SLP Time Calculation (min): 40 min  Assessment / Plan / Recommendation Clinical Impression  Swallow and cognitive-linguistic treatment provided during breakfast.  He is making excellent progress towards goals. Pt. Self fed and required frequent maximum tactile (hand over hand), visual and verbal cues (visual least effective) for slower pace and much smaller bites and sips.  Inhalation immediately following swallow indicative of intermittent discoordination of respiration and deglutition.  RN documentation of lung sounds and temperature have not indicated aspiration, however, SLP will continue dysphagia treatment as he is being transferred to inpt. Rehab today.  [name] has began to verbally express himself (since this SLP last saw pt.) via one word responses to simple responsive naming questions and confrontational naming.  Comprehension to simple commands has improved as well.  He continues to require intense skilled ST intervention for comprehension and expression of wants needs as demands increase on rehab.   HPI HPI: Matthew Beard is a 38 y.o. 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  Intubated on admission 5/11. Pt was extubated on 04/02/14. CT brain revealed a focus of parenchymal hemorrhage at the junction of the left temporal and left parietal lobes.  BSE recommended dys 3/Nectar thick liquids. ST in for follow up to determine readiness to advance diet.   Pertinent Vitals WDL  SLP Plan  Continue with current plan of care    Recommendations Diet recommendations: Dysphagia 3 (mechanical soft);Nectar-thick  liquid Liquids provided via: Cup;No straw Medication Administration: Whole meds with puree Supervision: Patient able to self feed;Full supervision/cueing for compensatory strategies Compensations: Slow rate;Small sips/bites;Check for pocketing Postural Changes and/or Swallow Maneuvers: Seated upright 90 degrees              General recommendations: Rehab consult Oral Care Recommendations: Oral care BID Follow up Recommendations: Inpatient Rehab Plan: Continue with current plan of care    GO     Royce Macadamia M.Ed [employer] [phone]  04/19/2014

## 2014-04-19 NOTE — Progress Notes (Signed)
Occupational Therapy Treatment Patient Details Name: Matthew AustriaDamon N Elliott MRN: [id number] DOB: [birth year] Today's Date: 04/19/2014    History of present illness HPI: Matthew Beard is a 38 y.o. 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  Intubated on admission 5/11. Pt was extubated on 04/02/14. CT brain revealed a focus of parenchymal hemorrhage at the junction of the left temporal and left parietal lobes.   OT comments  Pt making progress with functional goals, OT will continue to follow  Follow Up Recommendations  CIR    Equipment Recommendations  3 in 1 bedside comode    Recommendations for Other Services      Precautions / Restrictions Precautions Precautions: Fall Restrictions Weight Bearing Restrictions: No Other Position/Activity Restrictions: Full Supervision for POs       Mobility Bed Mobility Overal bed mobility: Needs Assistance Bed Mobility: Supine to Sit     Supine to sit: Mod assist     General bed mobility comments: pt up in recliner upon entering room  Transfers Overall transfer level: Needs assistance Equipment used: Rolling walker (2 wheeled) Transfers: Sit to/from Stand Sit to Stand: Min assist         General transfer comment: Able to power up; cues for safety, and needed handheld/hand over hand cues to reach back for armrests to control descent    Balance Overall balance assessment: Needs assistance Sitting-balance support: No upper extremity supported;Feet supported Sitting balance-Leahy Scale: Good     Standing balance support: Single extremity supported;No upper extremity supported Standing balance-Leahy Scale: Fair                     ADL       Grooming: Wash/dry face;Sitting;Moderate assistance;Standing       Lower Body  Bathing: Moderate assistance;Sit to/from stand;Cueing for safety;Cueing for sequencing       Lower Body Dressing: Maximal assistance;Sitting/lateral leans   Toilet Transfer: Minimal assistance;Regular Toilet;Grab bars             General ADL Comments: pt non verbal most of session, required mutlimodal cues with increased time to initiate and complete tasks      Vision  blank stare                              Cognition   Behavior During Therapy: Flat affect Overall Cognitive Status: Difficult to assess Area of Impairment: Attention;Memory;Following commands;Safety/judgement;Awareness;Problem solving   Current Attention Level: Sustained Memory: Decreased short-term memory  Following Commands: Follows one step commands with increased time Safety/Judgement: Decreased awareness of safety;Decreased awareness of deficits   Problem Solving: Slow processing;Decreased initiation;Difficulty sequencing;Requires verbal cues;Requires tactile cues General Comments: Able to follow commands and answer simple questions when given adequate time                               General Comments  pt cooperative    Pertinent Vitals/ Pain       No c/o pain, VSS  Frequency Min 3X/week     Progress Toward Goals  OT Goals(current goals can now be found in the care plan section)   progressing  Acute Rehab OT Goals Patient Stated Goal: did not state but pt agreeable to ambulation  Plan Discharge plan remains appropriate                     End of Session Equipment Utilized During Treatment: Gait belt;Rolling walker   Activity Tolerance Patient tolerated treatment well   Patient Left with call bell/phone within reach;with nursing/sitter in room;in chair;with chair alarm set             Time: 1050-1104 OT Time Calculation (min): 14 min  Charges: OT General  Charges $OT Visit: 1 Procedure OT Treatments $Self Care/Home Management : 8-22 mins  Lafe Garin 04/19/2014, 1:34 PM

## 2014-04-19 NOTE — Progress Notes (Signed)
Rehab admissions - I continue to follow pt's case. I have received update from Dr. Sunnie Nielsen and spoken with renal PA as well. Per these conversations, pt is planned to get IJ dialysis catheter (likely at some point today) and then possible dialysis later today. Per our rehab MD, pt is appropriate to come to inpatient rehab following this IJ dialysis cath placement.  At this point, it is not clear when this IJ procedure will take place and if pt is to have HD today or not. I will continue to follow pt's case.  I left voicemail messages with pt's fiance and sister-in-law to share these updates and have not yet heard back. I spoke with patient and he was initially somewhat interactive but then had a blank stare at times.   I also updated case managers, Elnita Maxwell and [place], as well. Rn aware.  Please call me with any questions.  Thanks.  Juliann Mule, PT Rehabilitation Admissions Coordinator [phone]

## 2014-04-19 NOTE — Progress Notes (Signed)
Rehab admissions - I received update from Onalee Hua, nephrology PA that pt is to have IJ catheter placed later today or tomorrow am.  I will check on pt's status tomorrow and will consider admitting pt to inpatient rehab following the IJ catheter placement and his medical stability (pending our bed availability as well).  Pt's brother Ethelene Browns called me and I shared this update. He will update his wife, Albin Felling and pt's fiance Judeth Cornfield.  I also updated Elnita Maxwell, case Production designer, theatre/television/film.  Please call me with any questions. Thanks.  Juliann Mule, PT Rehabilitation Admissions Coordinator [phone]'

## 2014-04-19 NOTE — Progress Notes (Signed)
Patient ID: Matthew Beard, male   DOB: [birth year], 38 y.o.   MRN: [id number] [facility] PROGRESS NOTE  AJANI RINEER YZJ:[id number] DOB: 03-Mar-1976 DOA: 03/29/2014 PCP: [name], MD  Brief narrative: 38 year-old [place] American male with past medical history of end-stage renal disease, noncompliant with dialysis, history of hypertension, nonischemic cardiomyopathy and secondary congestive heart failure who was brought into the ED 03/29/2014 status post fall at home and having a seizure. He was found to have blood pressures as high as 249/144 and a head CT revealed a small focal hemorrhage at the junction of the left parietal and temporal lobes. Patient was intubated for protection of airway and agitation. Per neurology, recommendation was to keep SBP in 120-160 range but this proved to be difficult as he required multiple medications for BP control. He was put on Keppra. MRI brain done 03/31/2014 showed a 1.8 acute left temporal lobe bleed with multiple microinfarcts and bleed. In addition, pt was thought to have aspiration pneumonia and has completed abx treatments on 04/05/2014. 2 D ECHO done noted drop in ejection fraction from 50% down to 25% as well as global hypokinesis. Cardiology was consulted and felt that hypertension was contributory. Palliative care was consulted and met with family on 04/02/2014. Pt was extubated on 04/03/14. HD was resumed on 04/05/14.   Assessment/Plan:   Principal Problem:  ICH (intracerebral hemorrhage)/acute hemorrhagic CVA  Stabilized.  Repeated CT head showed 2 areas of small hemorrhage,new. Neurology re-consulted. For evaluation and for prognosis of recovery.   Appreciate Psych evaluation help with medications. Seroquel increase to 25 mg BID on 5-30.  Patient more verbal today.     Dysphagia  Dysphagia 3 is current diet  Uncontrolled hypertension  Continue norvasc 10 mg daily, coreg 25 mg PO BID, clonidine 0.3 mg PO TID, hydralazine 100 mg pO  Q 8 hours, imdur 30 mg daily and lisinopril 10 mg PO BID  IV PRN hydralazine   Started on lasix 5-31.   Anemia in chronic kidney disease  Status post 1 unit PRBC transfusion on 5/21. Hemoglobin stable. Continue Aranesp.   Thrombocytopenia  Platelets are now WNL  Cardiomyopathy  Most recent 2 D ECHO in 03/2014 showed EHF 25-30%  ESRD  HD per renal. Plan for IJ catheter for dialysis. Patient get agitated when they try to access AV fistula.   Acute respiratory failure with hypoxia / Aspiration pneumonia   Resolved. Secondary to aspiration pneumonia   Convulsions/seizures   On Keppra.   Acute encephalopathy  Agitation at times. Started on Seroquel. Does increased to 25 mg BID on 5-30. Will see if this help with behavior.    DVT prophylaxis: SCD's bilaterally   Code Status: Full code  Family Communication: Family not at the bedside  Disposition Plan: to ? CIR  Consultants:  Critical care  Nephrology  Neurology  Cardiology  Palliative care Procedures:  ETT 5/12 >>>5/15  Rt IJ Trialysis cath 03/30/14 >> 5/19  Echocardiogram done 5/12: Decreased ejection fraction of 84-03%, diastolic flattening, moderate dilatation of right and left atrium and ventricles. Severe LVH Antibiotics:  Vanc 5/11 >>5/11  Maxipime 5/11 >>>5/12  Imipenem 5/12 >>5/13  Levaquin 5/13 >> 5/18  Elmarie Shiley, MD  [facility] Pager [phone]  If 7PM-7AM, please contact night-coverage [email or web address] Password [email or web address] 04/19/2014, 6:26 PM   LOS: 21 days    HPI/Subjective:  He say few words, was feeding himself.   Objective: Filed Vitals:   04/18/14 2100 04/19/14 0700 04/19/14 1038 04/19/14  1616  BP: 175/103 169/97 212/119 164/112  Pulse: 78 82 66 71  Temp: 98.3 F (36.8 C) 98.1 F (36.7 C) 98.8 F (37.1 C) 97.9 F (36.6 C)  TempSrc: Oral Oral Oral Oral  Resp: 18 18 18 16   Height:      Weight: 67.4 kg (148 lb 9.4 oz)     SpO2: 94% 96% 97% 99%    Intake/Output Summary  (Last 24 hours) at 04/19/14 1826 Last data filed at 04/19/14 1358  Gross per 24 hour  Intake    480 ml  Output   1350 ml  Net   -870 ml    Exam:   General:  no acute distress  Cardiovascular: Regular rate and rhythm, S1/S2 appreciated, SEM appreciated 2/6  Respiratory: Clear to auscultation bilaterally, no wheezing, no crackles, no rhonchi  Abdomen: Soft, non tender, non distended, bowel sounds present, no guarding  Extremities: trace LE edema, pulses DP and PT palpable bilaterally  Neuro; Alert, say few words, following some commands today.    Data Reviewed: Basic Metabolic Panel:  Recent Labs Lab 04/17/14 0930 04/18/14 0505 04/19/14 1056  NA 143 142 140  K 6.0* 4.6 5.2  CL 106 103 103  CO2 21 20 17*  GLUCOSE 83 82 96  BUN 60* 72* 84*  CREATININE 6.99* 7.24* 7.37*  CALCIUM 8.5 8.3* 8.8  PHOS 6.7* 7.2*  --    Liver Function Tests:  Recent Labs Lab 04/17/14 0930 04/18/14 0505  ALBUMIN 2.7* 2.8*   No results found for this basename: LIPASE, AMYLASE,  in the last 168 hours No results found for this basename: AMMONIA,  in the last 168 hours CBC:  Recent Labs Lab 04/13/14 1207 04/18/14 0505  WBC 7.0 5.5  HGB 10.2* 10.0*  HCT 31.9* 31.6*  MCV 93.8 95.2  PLT 185 126*   Cardiac Enzymes: No results found for this basename: CKTOTAL, CKMB, CKMBINDEX, TROPONINI,  in the last 168 hours BNP: No components found with this basename: POCBNP,  CBG:  Recent Labs Lab 04/13/14 1649  GLUCAP 78    No results found for this or any previous visit (from the past 240 hour(s)).   Studies: No results found.  Scheduled Meds: . amLODipine  10 mg Oral Daily  . carvedilol  25 mg Oral BID WC  . cloNIDine  0.3 mg Oral TID  . darbepoetin (ARANESP) injection - DIALYSIS  200 mcg Intravenous Q Thu-HD  . feeding supplement (ENSURE)  1 Container Oral BID  . hydrALAZINE  100 mg Oral 3 times per day  . isosorbide mononitrate  30 mg Oral Daily  . levETIRAcetam  500 mg  Oral BID  . lisinopril  10 mg Oral BID  . multivitamin  1 tablet Oral QHS  . phenytoin  200 mg Oral BID

## 2014-04-19 NOTE — H&P (Signed)
Matthew Beard is an 38 y.o. male.   Chief Complaint: Pt ESRD (uncontrolled HTN) and has existing L arm fistula Dialysis has been unable to successfully dialyze for 2 days secondary agitation after new CVA Pt combative; biting staff; will not tolerate cannulation of fistula Request for consult for tunneled hemodialysis catheter placement Pt has been examined and chart reviewed Now scheduled for [facility] cath in IR 6/2  HPI: HTN; ESRD; CVA; cardiomyopathy; CHF; agitated- combative; Sz disorder  Past Medical History  Diagnosis Date  . Hypertension   . CKD (chronic kidney disease), stage IV   . Normocytic anemia   . Pericardial effusion     Remote hx  . Cerebrovascular disease     Chronic lacunar infarcts  . NICM (nonischemic cardiomyopathy)   . CHF (congestive heart failure)   . Shortness of breath     Past Surgical History  Procedure Laterality Date  . Av fistula placement Left 10/19/2013    Procedure: ARTERIOVENOUS (AV) FISTULA CREATION- LEFT RADIOCEPHALIC;  Surgeon: Rosetta Posner, MD;  Location: [facility] OR;  Service: Vascular;  Laterality: Left;    Family History  Problem Relation Age of Onset  . Hypertension Mother   . Stroke Mother   . Heart disease Mother     Heart Disease before age 38  . Hypertension Father   . Stroke Father   . Heart disease Father     Heart Disease before age 83  . Hypertension Brother    Social History:  reports that he has never smoked. He has never used smokeless tobacco. He reports that he does not drink alcohol or use illicit drugs.  Allergies:  Allergies  Allergen Reactions  . Amoxicillin Hives    Medications Prior to Admission  Medication Sig Dispense Refill  . amLODipine (NORVASC) 10 MG tablet Take 1 tablet (10 mg total) by mouth daily.  30 tablet  0  . carvedilol (COREG) 25 MG tablet Take 1 tablet (25 mg total) by mouth 2 (two) times daily with a meal.  60 tablet  0  . cloNIDine (CATAPRES) 0.2 MG tablet Take 1 tablet (0.2 mg total) by  mouth 3 (three) times daily.  90 tablet  0  . furosemide (LASIX) 80 MG tablet Take 80 mg by mouth 2 (two) times daily.      . hydrALAZINE (APRESOLINE) 100 MG tablet Take 1 tablet (100 mg total) by mouth 3 (three) times daily.  90 tablet  0  . isosorbide mononitrate (IMDUR) 30 MG 24 hr tablet Take 1 tablet (30 mg total) by mouth daily.  30 tablet  0  . metolazone (ZAROXOLYN) 5 MG tablet Take 5 mg by mouth daily.      . potassium chloride (K-DUR) 10 MEQ tablet Take 10 mEq by mouth daily.      . sevelamer carbonate (RENVELA) 800 MG tablet Take 2 tablets (1,600 mg total) by mouth 3 (three) times daily with meals.  90 tablet  0    Results for orders placed during the hospital encounter of 03/29/14 (from the past 48 hour(s))  RENAL FUNCTION PANEL     Status: Abnormal   Collection Time    04/18/14  5:05 AM      Result Value Ref Range   Sodium 142  137 - 147 mEq/L   Potassium 4.6  3.7 - 5.3 mEq/L   Comment: DELTA CHECK NOTED   Chloride 103  96 - 112 mEq/L   CO2 20  19 - 32 mEq/L  Glucose, Bld 82  70 - 99 mg/dL   BUN 72 (*) 6 - 23 mg/dL   Creatinine, Ser 7.24 (*) 0.50 - 1.35 mg/dL   Calcium 8.3 (*) 8.4 - 10.5 mg/dL   Phosphorus 7.2 (*) 2.3 - 4.6 mg/dL   Albumin 2.8 (*) 3.5 - 5.2 g/dL   GFR calc non Af Amer 9 (*) >90 mL/min   GFR calc Af Amer 10 (*) >90 mL/min   Comment: (NOTE)     The eGFR has been calculated using the CKD EPI equation.     This calculation has not been validated in all clinical situations.     eGFR's persistently <90 mL/min signify possible Chronic Kidney     Disease.  CBC     Status: Abnormal   Collection Time    04/18/14  5:05 AM      Result Value Ref Range   WBC 5.5  4.0 - 10.5 K/uL   RBC 3.32 (*) 4.22 - 5.81 MIL/uL   Hemoglobin 10.0 (*) 13.0 - 17.0 g/dL   HCT 31.6 (*) 39.0 - 52.0 %   MCV 95.2  78.0 - 100.0 fL   MCH 30.1  26.0 - 34.0 pg   MCHC 31.6  30.0 - 36.0 g/dL   RDW 18.4 (*) 11.5 - 15.5 %   Platelets 126 (*) 150 - 400 K/uL  BASIC METABOLIC PANEL      Status: Abnormal   Collection Time    04/19/14 10:56 AM      Result Value Ref Range   Sodium 140  137 - 147 mEq/L   Potassium 5.2  3.7 - 5.3 mEq/L   Chloride 103  96 - 112 mEq/L   CO2 17 (*) 19 - 32 mEq/L   Glucose, Bld 96  70 - 99 mg/dL   BUN 84 (*) 6 - 23 mg/dL   Creatinine, Ser 7.37 (*) 0.50 - 1.35 mg/dL   Calcium 8.8  8.4 - 10.5 mg/dL   GFR calc non Af Amer 8 (*) >90 mL/min   GFR calc Af Amer 10 (*) >90 mL/min   Comment: (NOTE)     The eGFR has been calculated using the CKD EPI equation.     This calculation has not been validated in all clinical situations.     eGFR's persistently <90 mL/min signify possible Chronic Kidney     Disease.   No results found.  Review of Systems  Constitutional: Positive for weight loss. Negative for fever.  Respiratory: Negative for shortness of breath.   Cardiovascular: Negative for chest pain.  Gastrointestinal: Negative for nausea, vomiting and abdominal pain.  Neurological: Positive for weakness. Negative for headaches.  Psychiatric/Behavioral: The patient is nervous/anxious.        Agitated; combative     Blood pressure 212/119, pulse 66, temperature 98.8 F (37.1 C), temperature source Oral, resp. rate 18, height _0  (1.778 m), weight 67.4 kg (148 lb 9.4 oz), SpO2 97.00%. Physical Exam  Constitutional: He appears well-developed.  Cardiovascular: Normal rate and regular rhythm.   No murmur heard. Respiratory: Effort normal and breath sounds normal. He has no wheezes.  GI: Soft. Bowel sounds are normal. There is no tenderness.  Musculoskeletal:  Pt asleep; will not arouse to cooperate  Neurological:  Pt asleep---will not cooperate  Skin: Skin is warm and dry.  Psychiatric:  Consented brother via phone     Assessment/Plan ESRD Pt has L arm fistula but staff unable to cannulize secondary agitation Recent CVA Combative; biting staff  Request for HD cath using sedation in IR per Dr Melvia Heaps Scheduled for HD cath 6/2 am Pts  brother aware of procedure benefits and risks and agreeable to proceed Consent signed via phone and in chart Vanco on call  Lavonia Drafts 04/19/2014, 2:20 PM

## 2014-04-19 NOTE — Progress Notes (Signed)
MEDICATION RELATED CONSULT NOTE - Follow-Up   Pharmacy Consult for phenytoin Indication: seizures  Allergies  Allergen Reactions  . Amoxicillin Hives    Patient Measurements: Height: 5\' 10"  (177.8 cm) Weight: 148 lb 9.4 oz (67.4 kg) IBW/kg (Calculated) : 73   Vital Signs: Temp: 98.8 F (37.1 C) (06/01 1038) Temp src: Oral (06/01 1038) BP: 212/119 mmHg (06/01 1038) Pulse Rate: 66 (06/01 1038) Intake/Output from previous day: 05/31 0701 - 06/01 0700 In: 960 [P.O.:960] Out: 2950 [Urine:2950] Intake/Output from this shift: Total I/O In: 240 [P.O.:240] Out: -   Labs:  Recent Labs  04/17/14 0930 04/18/14 0505 04/19/14 1056  WBC  --  5.5  --   HGB  --  10.0*  --   HCT  --  31.6*  --   PLT  --  126*  --   CREATININE 6.99* 7.24* 7.37*  PHOS 6.7* 7.2*  --   ALBUMIN 2.7* 2.8*  --    Estimated Creatinine Clearance: 12.8 ml/min (by C-G formula based on Cr of 7.37).   Microbiology: Recent Results (from the past 720 hour(s))  URINE CULTURE     Status: None   Collection Time    03/29/14 10:51 PM      Result Value Ref Range Status   Specimen Description URINE, CATHETERIZED   Final   Special Requests NONE   Final   Culture  Setup Time     Final   Value: 03/29/2014 23:32     [facility]formed [facility]   Colony Count     Final   Value: NO GROWTH     [facility]formed [facility]   Culture     Final   Value: NO GROWTH     [facility]formed at [facility]   Report Status 03/31/2014 FINAL   Final  CULTURE, BLOOD (ROUTINE X 2)     Status: None   Collection Time    03/29/14 10:55 PM      Result Value Ref Range Status   Specimen Description BLOOD ARM RIGHT   Final   Special Requests BOTTLES DRAWN AEROBIC AND ANAEROBIC 5CC EA   Final   Culture  Setup Time     Final   Value: 03/30/2014 04:10     [facility]formed at [facility]   Culture     Final   Value: NO GROWTH 5 DAYS     Performed at [facility]   Report Status 04/05/2014 FINAL   Final   CULTURE, BLOOD (ROUTINE X 2)     Status: None   Collection Time    03/29/14 11:03 PM      Result Value Ref Range Status   Specimen Description BLOOD HAND LEFT   Final   Special Requests BOTTLES DRAWN AEROBIC ONLY [facility]6CC   Final   Culture  Setup Time     Final   Value: 03/30/2014 04:10     Performed at [facility]   Culture     Final   Value: NO GROWTH 5 DAYS     Performed at [facility]   Report Status 04/05/2014 FINAL   Final  MRSA PCR SCREENING     Status: None   Collection Time    03/30/14  4:20 AM      Result Value Ref Range Status   MRSA by PCR NEGATIVE  NEGATIVE Final   Comment:            The GeneXpert MRSA Assay (FDA  approved for NASAL specimens     only), is one component of a     comprehensive MRSA colonization     surveillance program. It is not     intended to diagnose MRSA     infection nor to guide or     monitor treatment for     MRSA infections.  CULTURE, RESPIRATORY (NON-EXPECTORATED)     Status: None   Collection Time    03/30/14  4:45 AM      Result Value Ref Range Status   Specimen Description TRACHEAL ASPIRATE   Final   Special Requests NONE   Final   Gram Stain     Final   Value: MODERATE WBC PRESENT,BOTH PMN AND MONONUCLEAR     NO SQUAMOUS EPITHELIAL CELLS SEEN     ABUNDANT GRAM POSITIVE COCCI     IN PAIRS     Performed at [facility]   Culture     Final   Value: Non-Pathogenic Oropharyngeal-type Flora Isolated.     Performed at [facility]   Report Status 04/01/2014 FINAL   Final   Assessment: 58 YOM originally admitted to ICU with seizures and ICH. He was started on phenytoin on 5/16 by neurology who has now signed off. Levels obtained on 5/18 revealed a corrected phenytoin level of 13.2mg /L and free (unbound) phenytoin level of 0.9 mg/L which is slightly low. However, these levels were not drawn at steady state of phenytoin and therefore are difficult to assess.  Phenytoin level on 5/23 was 3.0 which  corrects to a subtherapeutic level of 8.3mg /L. Patient has missed a couple of doses during the passed couple of days.   Goal of Therapy:  Total corrected phenytoin level 10-20mg /L  Plan:  - Continue phenytoin ER to 200mg  BID for now - Will recheck phenytoin level/albumin with AM labs tomorrow.  Tad Moore, BCPS  Clinical Pharmacist Pager [phone]  04/19/2014 3:35 PM

## 2014-04-19 NOTE — Progress Notes (Signed)
Subjective:  More animated this am =" I' m okay / good oatmeal"/ eating brk with ST monitoring his swallowing  Objective Vital signs in last 24 hours: Filed Vitals:   04/18/14 0624 04/18/14 1100 04/18/14 2100 04/19/14 0700  BP: 181/109 175/108 175/103 169/97  Pulse: 73 88 78 82  Temp: 98 F (36.7 C) 99.3 F (37.4 C) 98.3 F (36.8 C) 98.1 F (36.7 C)  TempSrc: Oral Oral Oral Oral  Resp: 18 19 18 18   Height:      Weight:   67.4 kg (148 lb 9.4 oz)   SpO2: 96%  94% 96%  Physical Exam  General: awake, alert, More  verbal Heart: RRR /S4, 1/6 sem lsb, no rub or gallop Lungs:CTA bilat  Abdomen:BS + soft NT ND Extremities: no LE edema  Dialysis Access: left AVF + bruit   Dialysis Orders: [facility]  4h 79kg 2/2.25 Bath 300/800 Heparin none due to bleed (was 8000) LUA AVF  Hectorol 4 Aranesp 100 Q Wed Venofer 100/hd thru   Assessment/Plan:  1 Acute CVA - left temporal lobe, hemorrhagic /Rehab monitoring and hope to transfer if he can do HD  2 HTN/ vol excess-severe HTN On admit to moderate HTN And volume Has been down 11 kg/ also on 5 BP meds but think volume control is primary issue. Lowest weight now is 67 kg and probably can be lowered even further / fu wts and bp today. BP elevated today - needs volume down with HD; as he is till making urine -  Yesterday Lasix 80mg  PO bid for short term to help facilitate volume removal /Urine outpt 600 - 1000 per day. HAD 2950 op yesterday! 3 ESRD MWF - last attempted HD Friday - deferred Saturday - K was 6 down to 4.6 after kayexalate; noncooperative with HD biting staff and flailing around Last recorded UF was 2.6 on 5/26 - had some other degree of HD 5/29 but insignificant - CHECK Labs today 4 AMS sec CVA- he has been seen by psych and neuro - now on tapering seroquel  Up Per PSY Recommendations   5 Anemia Hb 10.0 s/p1u RBC transfusion 5/21. Was on 100/week increased to 200 but did not get 5/28)- follow - NEED to adjust it for when he gets his next  HD - scheduled for Monday 100   6 HPTH Ca+ 9.5 corrected/ Phos 7.2 resumed renvelal 1 ac tid; resumed hectorol at half previous dose starting 5/30 - but did not get because he did not get dialysis iPTH last 629  7 Nutrition- alb 2.8 dsy diet. Multivit, supplements  8 NICM EF 25-30%  9 Dispo- to inpatient rehab noted per rehab needs to be compliant with HD txs before accepted/ insurance issues pendi  Lenny Pastelavid Zeyfang, PA-C [facility] [phone] 04/19/2014,8:15 AM  LOS: 21 days   Pt seen, examined and agree w A/P as above. Pt has been exhibiting behavior patterns over the last week with periods of agitation, cursing, attempting to bite caregivers, etc., surrounding dialysis but also not involved with dialysis.  I spoke with pt's brother who says the patient's mental functioning was "100%" prior to this stroke and that he held his job up until he started HD in April this year. His only complaint according to the brother was that he was getting "tired of being stuck" at dialysis.  He made his own decisions prior to this admission. Now the brother is making decisions. Temporal lobe CVA can cause a host of non-paralytic  symptoms involving speech, speech processing, memory, behavior, vision, hearing and more.  It is likely that a lot of these behavior issues are related to the stroke and some adverse affect it is having on him. But also, I know from reviewing the chart the patient had very severe problems with HTN control going back several years before he was on dialysis, and on multiple occassions it was noted in the chart that the patient was noncompliant with medication use. The brother has consented for placement of a tunneled HD catheter, but I don't know what the patient will tolerate or put up with. Will go ahead and request tunneled HD catheter and if one can be successfully placed (he will probably need to be sedated) we will proceed to try dialysis again.    Vinson Moselle MD pager  [phone]    cell [phone] 04/19/2014, 12:51 PM    Labs: Basic Metabolic Panel:  Recent Labs Lab 04/17/14 0930 04/18/14 0505  NA 143 142  K 6.0* 4.6  CL 106 103  CO2 21 20  GLUCOSE 83 82  BUN 60* 72*  CREATININE 6.99* 7.24*  CALCIUM 8.5 8.3*  PHOS 6.7* 7.2*   Liver Function Tests:  Recent Labs Lab 04/17/14 0930 04/18/14 0505  ALBUMIN 2.7* 2.8*  CBC:  Recent Labs Lab 04/13/14 1207 04/18/14 0505  WBC 7.0 5.5  HGB 10.2* 10.0*  HCT 31.9* 31.6*  MCV 93.8 95.2  PLT 185 126*   Cardiac Enzymes: No results found for this basename: CKTOTAL, CKMB, CKMBINDEX, TROPONINI,  in the last 168 hours CBG:  Recent Labs Lab 04/13/14 1649  GLUCAP 78    Studies/Results: No results found. Medications:   . amLODipine  10 mg Oral Daily  . carvedilol  25 mg Oral BID WC  . cloNIDine  0.3 mg Oral TID  . darbepoetin (ARANESP) injection - DIALYSIS  100 mcg Intravenous Q Mon-HD  . doxercalciferol  2 mcg Intravenous Q M,W,F-HD  . feeding supplement (ENSURE)  1 Container Oral BID  . feeding supplement (NEPRO CARB STEADY)  237 mL Oral Daily  . furosemide  80 mg Oral BID  . hydrALAZINE  100 mg Oral 3 times per day  . isosorbide mononitrate  30 mg Oral Daily  . levETIRAcetam  500 mg Oral BID  . lisinopril  10 mg Oral BID  . multivitamin  1 tablet Oral QHS  . phenytoin  200 mg Oral BID  . QUEtiapine  25 mg Oral BID  . sevelamer carbonate  800 mg Oral TID WC

## 2014-04-19 NOTE — Progress Notes (Signed)
Physical Therapy Treatment Patient Details Name: Matthew AustriaDamon N Koslowski MRN: [id number] DOB: 1976/10/02 Today's Date: 04/19/2014    History of Present Illness HPI: Matthew Beard is a 38 y.o. 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  Intubated on admission 5/11. Pt was extubated on 04/02/14. CT brain revealed a focus of parenchymal hemorrhage at the junction of the left temporal and left parietal lobes.    PT Comments    Continuing progress with functional mobility; Less impulsive today with good overall use of RW, and more stable gait  Continue to recommend comprehensive inpatient rehab (CIR) for post-acute therapy needs.   Follow Up Recommendations  CIR     Equipment Recommendations  None recommended by PT    Recommendations for Other Services       Precautions / Restrictions Precautions Precautions: Fall Restrictions Other Position/Activity Restrictions: Full Supervision for POs    Mobility  Bed Mobility Overal bed mobility: Needs Assistance Bed Mobility: Supine to Sit     Supine to sit: Mod assist     General bed mobility comments: Slow to initate, and required mod assist and use of bed pad to square off hips at EOB  Transfers Overall transfer level: Needs assistance Equipment used: Rolling walker (2 wheeled) Transfers: Sit to/from Stand Sit to Stand: Min assist         General transfer comment: Able to power up; cues for safety, and needed handheld/hand over hand cues to reach back for armrests to control descent  Ambulation/Gait Ambulation/Gait assistance: Min assist;Mod assist Ambulation Distance (Feet): 80 Feet Assistive device: Rolling walker (2 wheeled) Gait Pattern/deviations: Shuffle;Decreased stride length;Decreased step length - right;Decreased step  length - left;Decreased stance time - right;Decreased stance time - left   Gait velocity interpretation: <1.8 ft/sec, indicative of risk for recurrent falls General Gait Details: Short steps; Requiring mod assist at times for RW maneuvering, especially with turning; stopped to adjust socks/scratch foot, and able to do so with one hand on rW for steadiness, however did lose balance and required max assist to prevent fall   Stairs            Wheelchair Mobility    Modified Rankin (Stroke Patients Only) Modified Rankin (Stroke Patients Only) Pre-Morbid Rankin Score: Moderately severe disability Modified Rankin: Severe disability     Balance Overall balance assessment: Needs assistance           Standing balance-Leahy Scale: Fair                      Cognition Arousal/Alertness: Awake/alert Behavior During Therapy: Flat affect Overall Cognitive Status: Difficult to assess Area of Impairment: Attention;Memory;Following commands;Safety/judgement;Awareness;Problem solving   Current Attention Level: Sustained Memory: Decreased short-term memory Following Commands: Follows one step commands with increased time Safety/Judgement: Decreased awareness of safety;Decreased awareness of deficits   Problem Solving: Slow processing;Decreased initiation;Difficulty sequencing;Requires verbal cues;Requires tactile cues General Comments: Able to follow commands and answer simple questions when given adequate time    Exercises      General Comments        Pertinent Vitals/Pain no apparent distress     Home Living                      Prior Function  PT Goals (current goals can now be found in the care plan section) Acute Rehab PT Goals Patient Stated Goal: did not state but pt agreeable to ambulation PT Goal Formulation: With patient Time For Goal Achievement: 04/21/14 Potential to Achieve Goals: Good Progress towards PT goals: Progressing  toward goals    Frequency  Min 4X/week    PT Plan Current plan remains appropriate    Co-evaluation             End of Session Equipment Utilized During Treatment: Gait belt Activity Tolerance: Patient tolerated treatment well Patient left: in chair;with call bell/phone within reach;with chair alarm set     Time: 0950-1020 PT Time Calculation (min): 30 min  Charges:  $Gait Training: 8-22 mins $Therapeutic Activity: 8-22 mins                    G Codes:      [facility] [place] 04/19/2014, 10:57 AM  Van Clines, PT  Acute Rehabilitation Services Pager [phone] Office [phone]

## 2014-04-20 ENCOUNTER — Inpatient Hospital Stay (HOSPITAL_COMMUNITY): Payer: Medicaid Other

## 2014-04-20 LAB — ALBUMIN: Albumin: 3 g/dL — ABNORMAL LOW (ref 3.5–5.2)

## 2014-04-20 LAB — PHENYTOIN LEVEL, TOTAL: Phenytoin Lvl: 7.6 ug/mL — ABNORMAL LOW (ref 10.0–20.0)

## 2014-04-20 MED ORDER — MINOXIDIL 2.5 MG PO TABS
2.5000 mg | ORAL_TABLET | Freq: Two times a day (BID) | ORAL | Status: DC
  Administered 2014-04-20 – 2014-04-21 (×2): 2.5 mg via ORAL
  Filled 2014-04-20 (×4): qty 1

## 2014-04-20 MED ORDER — MIDAZOLAM HCL 2 MG/2ML IJ SOLN
INTRAMUSCULAR | Status: AC
  Filled 2014-04-20: qty 4

## 2014-04-20 MED ORDER — LISINOPRIL 20 MG PO TABS
20.0000 mg | ORAL_TABLET | Freq: Every day | ORAL | Status: DC
  Filled 2014-04-20: qty 1

## 2014-04-20 MED ORDER — DARBEPOETIN ALFA-POLYSORBATE 100 MCG/0.5ML IJ SOLN
100.0000 ug | INTRAMUSCULAR | Status: DC
  Administered 2014-04-20: 100 ug via INTRAVENOUS

## 2014-04-20 MED ORDER — DOXERCALCIFEROL 4 MCG/2ML IV SOLN
2.0000 ug | INTRAVENOUS | Status: DC
  Administered 2014-04-20: 2 ug via INTRAVENOUS

## 2014-04-20 MED ORDER — FENTANYL CITRATE 0.05 MG/ML IJ SOLN
INTRAMUSCULAR | Status: AC | PRN
  Administered 2014-04-20 (×2): 50 ug via INTRAVENOUS

## 2014-04-20 MED ORDER — DARBEPOETIN ALFA-POLYSORBATE 100 MCG/0.5ML IJ SOLN
INTRAMUSCULAR | Status: AC
  Filled 2014-04-20: qty 0.5

## 2014-04-20 MED ORDER — FENTANYL CITRATE 0.05 MG/ML IJ SOLN
INTRAMUSCULAR | Status: AC
  Filled 2014-04-20: qty 4

## 2014-04-20 MED ORDER — MIDAZOLAM HCL 2 MG/2ML IJ SOLN
INTRAMUSCULAR | Status: AC | PRN
  Administered 2014-04-20 (×2): 1 mg via INTRAVENOUS

## 2014-04-20 MED ORDER — DOXERCALCIFEROL 4 MCG/2ML IV SOLN
INTRAVENOUS | Status: AC
  Filled 2014-04-20: qty 2

## 2014-04-20 MED ORDER — FENTANYL CITRATE 0.05 MG/ML IJ SOLN
INTRAMUSCULAR | Status: AC
  Filled 2014-04-20: qty 2

## 2014-04-20 MED ORDER — AMLODIPINE BESYLATE 10 MG PO TABS
10.0000 mg | ORAL_TABLET | Freq: Every day | ORAL | Status: DC
  Administered 2014-04-20: 10 mg via ORAL
  Filled 2014-04-20 (×2): qty 1

## 2014-04-20 MED ORDER — HEPARIN SODIUM (PORCINE) 1000 UNIT/ML IJ SOLN
INTRAMUSCULAR | Status: AC
  Filled 2014-04-20: qty 1

## 2014-04-20 NOTE — Progress Notes (Addendum)
Matthew Beard Progress Note  Assessment/Plan: 1. Acute hemorrhagic CVA - left temporal lobe - hopefully for transfer to Rehab once cath placed and pt is dialyzed. 2. ESRD - MWF - off schedule - for perm cath placement today - can't cooperate at this time to use AVF due to unsafe aggressive behavioral inspite of seoquel; lasix 80 bid for now 2 L outpt Monday and 1650 so far today; plan to change to TTS while on rehab 3. Anemia - Hgb 10 s/p transfusion 5/21 - Aranesp  100 scheduled for Monday - give today 6/2 4. Secondary hyperparathyroidism - on hectorol/renvela restarted - recheck labs pre HD today. 5. Malignant/ HTN/volume - volume outpt increased with ^ in lasix but no effect on BP; d/w with Dr. Arlean HoppingSchertz - add minoxidil 2.5 bid with parameters to hold other meds if SBP < 120; titrate EDW more on HD 6. Nutrition - NPO for procedure, ST following  Matthew SliderMartha B Bergman, PA-C [facility] [phone] 04/20/2014,10:45 AM  LOS: 22 days   Pt seen, examined and agree w A/P as above. For tunneled HD cath and HD today.  Also we are starting minoxidil for refractory HTN.   Matthew Beard Matthew Goodrich MD pager [phone]    cell [phone] 04/20/2014, 11:48 AM  Subjective:     Objective Filed Vitals:   04/19/14 1616 04/19/14 2042 04/20/14 0441 04/20/14 0911  BP: 164/112 174/115 182/117 189/120  Pulse: 71 71 74 83  Temp: 97.9 F (36.6 C) 98.6 F (37 C) 98.3 F (36.8 C) 98.9 F (37.2 C)  TempSrc: Oral Oral Oral Oral  Resp: 16 16 17 18   Height:      Weight:  67.4 kg (148 lb 9.4 oz)    SpO2: 99% 96% 97% 96%   Physical Exam General: Heart: Lungs: Abdomen: Extremities: Dialysis Access: left AVF  Dialysis Orders:[facility]  4h 79kg 2/2.25 Bath 300/800 Heparin none due to bleed (was 8000) LUA AVF  Hectorol 4 Aranesp 100 Q Wed Venofer 100/hd thru    Additional Objective Labs: Basic Metabolic Panel:  Recent Labs Lab 04/17/14 0930 04/18/14 0505 04/19/14 1056  NA 143 142  140  K 6.0* 4.6 5.2  CL 106 103 103  CO2 21 20 17*  GLUCOSE 83 82 96  BUN 60* 72* 84*  CREATININE 6.99* 7.24* 7.37*  CALCIUM 8.5 8.3* 8.8  PHOS 6.7* 7.2*  --    Liver Function Tests:  Recent Labs Lab 04/17/14 0930 04/18/14 0505 04/20/14 0711  ALBUMIN 2.7* 2.8* 3.0*   CBC:  Recent Labs Lab 04/13/14 1207 04/18/14 0505  WBC 7.0 5.5  HGB 10.2* 10.0*  HCT 31.9* 31.6*  MCV 93.8 95.2  PLT 185 126*   CBG:  Recent Labs Lab 04/13/14 1649  GLUCAP 78  Medications:   . amLODipine  10 mg Oral Q2200  . carvedilol  25 mg Oral BID WC  . cloNIDine  0.3 mg Oral TID  . darbepoetin (ARANESP) injection - DIALYSIS  100 mcg Intravenous Q Mon-HD  . doxercalciferol  2 mcg Intravenous Q M,W,F-HD  . feeding supplement (ENSURE)  1 Container Oral BID  . feeding supplement (NEPRO CARB STEADY)  237 mL Oral Daily  . furosemide  80 mg Oral BID  . hydrALAZINE  100 mg Oral 3 times per day  . isosorbide mononitrate  30 mg Oral Daily  . levETIRAcetam  500 mg Oral BID  . lisinopril  10 mg Oral BID  . multivitamin  1 tablet Oral QHS  .  phenytoin  200 mg Oral BID  . QUEtiapine  25 mg Oral BID  . sevelamer carbonate  800 mg Oral TID WC  . vancomycin  1,000 mg Intravenous Once

## 2014-04-20 NOTE — Progress Notes (Signed)
Speech Language Pathology Treatment: Cognitive-Linquistic (PO held as pt was NPO for procedure)  Patient Details Name: Matthew Beard MRN: [id number] DOB: [birth year] Today's Date: 04/20/2014 Time: 5852-7782 SLP Time Calculation (min): 12 min  Assessment / Plan / Recommendation Clinical Impression  Pt was seen for cognitive-linguistic treatment, with PO trials held as pt was NPO for procedure. Pt with increased verbal output today, with spontaneous greeting upon SLP entrance. Pt participated in confrontational naming task, labeling one item independently and requiring Max sentence completion cues to accurately name remaining objects. Pt participated in automatic speech tasks, counting from 1-25 with Max cues for initiation. Pt demonstrated decreased ability to stop task, perseverating on numbers when attempting other speech tasks (days of the week, months of the year). Continue to recommend CIR.   HPI HPI: Matthew Beard is a 38 y.o. 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  Intubated on admission 5/11. Pt was extubated on 04/02/14. CT brain revealed a focus of parenchymal hemorrhage at the junction of the left temporal and left parietal lobes.  BSE recommended dys 3/Nectar thick liquids. ST in for follow up to determine readiness to advance diet.   Pertinent Vitals N/A  SLP Plan  Continue with current plan of care    Recommendations      Oral Care Recommendations: Oral care BID Follow up Recommendations: Inpatient Rehab;24 hour supervision/assistance Plan: Continue with current plan of care    GO      Maxcine Ham, M.A. CCC-SLP [phone]  Maxcine Ham 04/20/2014, 11:33 AM

## 2014-04-20 NOTE — Progress Notes (Signed)
Patient ID: Matthew Beard, male   DOB: Jul 22, 1976, 38 y.o.   MRN: [id number] [facility] PROGRESS NOTE  Matthew Beard WNU:[id number] DOB: January 12, 1976 DOA: 03/29/2014 PCP: [name], MD  Brief narrative: 38 year-old [place] American male with past medical history of end-stage renal disease, noncompliant with dialysis, history of hypertension, nonischemic cardiomyopathy and secondary congestive heart failure who was brought into the ED 03/29/2014 status post fall at home and having a seizure. He was found to have blood pressures as high as 249/144 and a head CT revealed a small focal hemorrhage at the junction of the left parietal and temporal lobes. Patient was intubated for protection of airway and agitation. Per neurology, recommendation was to keep SBP in 120-160 range but this proved to be difficult as he required multiple medications for BP control. He was put on Keppra. MRI brain done 03/31/2014 showed a 1.8 acute left temporal lobe bleed with multiple microinfarcts and bleed. In addition, pt was thought to have aspiration pneumonia and has completed abx treatments on 04/05/2014. 2 D ECHO done noted drop in ejection fraction from 50% down to 25% as well as global hypokinesis. Cardiology was consulted and felt that hypertension was contributory. Palliative care was consulted and met with family on 04/02/2014. Pt was extubated on 04/03/14. HD was resumed on 04/05/14. Patient was transfer to the floor. He has had behavior problems during dialysis. He is aphasic, at times he say few words. A repeated CT head show 2 new area of hemorrhage. No further evaluation per neuro. Psych was consulted for capacity and to help with agitation. Patient was started on Seroquel. His behavior has improved. Plan is for IJ cath for dialysis. Dialysis today and possible admission to inpatient rehab 6-3 if patient stable. He needs volume remove during dialysis that will probably help with HTN.     Assessment/Plan:    Principal Problem:  ICH (intracerebral hemorrhage)/acute hemorrhagic CVA  Stabilized.  Repeated CT head showed 2 areas of small hemorrhage,new. Neurology re-consulted. For evaluation and for prognosis of recovery.   Appreciate Psych evaluation help with medications. Seroquel increase to 25 mg BID on 5-30.  Patient was more verbal 6-1, his mentation fluctuates.     Dysphagia  Dysphagia 3 is current diet  Uncontrolled hypertension  Continue norvasc 10 mg daily, coreg 25 mg PO BID, clonidine 0.3 mg PO TID, hydralazine 100 mg pO Q 8 hours, imdur 30 mg daily and lisinopril 20 mg daily.   IV PRN hydralazine   Started on lasix 5-31. Minoxidil 6-2   Anemia in chronic kidney disease  Status post 1 unit PRBC transfusion on 5/21. Hemoglobin stable. Continue Aranesp.   Thrombocytopenia  Platelets are now WNL  Cardiomyopathy  Most recent 2 D ECHO in 03/2014 showed EHF 25-30%  ESRD  HD per renal. Plan for IJ catheter for dialysis. Patient get agitated when they try to access AV fistula.   Acute respiratory failure with hypoxia / Aspiration pneumonia   Resolved. Secondary to aspiration pneumonia   Convulsions/seizures   On Keppra.   Acute encephalopathy  Agitation at times. Started on Seroquel. Does increased to 25 mg BID on 5-30. Will see if this help with behavior.    DVT prophylaxis: SCD's bilaterally   Code Status: Full code  Family Communication: Family not at the bedside  Disposition Plan: to ? CIR  Consultants:  Critical care  Nephrology  Neurology  Cardiology  Palliative care Procedures:  ETT 5/12 >>>5/15  Rt IJ Trialysis cath 03/30/14 >>  5/19  Echocardiogram done 5/12: Decreased ejection fraction of 25-05%, diastolic flattening, moderate dilatation of right and left atrium and ventricles. Severe LVH Antibiotics:  Vanc 5/11 >>5/11  Maxipime 5/11 >>>5/12  Imipenem 5/12 >>5/13  Levaquin 5/13 >> 5/18  Elmarie Shiley, MD  [facility] Pager [phone]  If 7PM-7AM, please contact night-coverage [email or web address] Password [email or web address] 04/20/2014, 5:03 PM   LOS: 22 days    HPI/Subjective: No verbal today, alert no significant changes.   Objective: Filed Vitals:   04/20/14 1504 04/20/14 1510 04/20/14 1557 04/20/14 1626  BP: 157/103 150/98 176/116 178/117  Pulse: 73 71 74 69  Temp:   97.4 F (36.3 C)   TempSrc:   Oral   Resp: 10 10 12 12   Height:      Weight:      SpO2: 100% 100% 98%     Intake/Output Summary (Last 24 hours) at 04/20/14 1703 Last data filed at 04/20/14 0900  Gross per 24 hour  Intake    300 ml  Output    750 ml  Net   -450 ml    Exam:   General:  no acute distress  Cardiovascular: Regular rate and rhythm, S1/S2 appreciated, SEM appreciated 2/6  Respiratory: Clear to auscultation bilaterally, no wheezing, no crackles, no rhonchi  Abdomen: Soft, non tender, non distended, bowel sounds present, no guarding  Extremities: trace LE edema, pulses DP and PT palpable bilaterally  Neuro; Alert, non verbal at time of my evaluation.    Data Reviewed: Basic Metabolic Panel:  Recent Labs Lab 04/17/14 0930 04/18/14 0505 04/19/14 1056  NA 143 142 140  K 6.0* 4.6 5.2  CL 106 103 103  CO2 21 20 17*  GLUCOSE 83 82 96  BUN 60* 72* 84*  CREATININE 6.99* 7.24* 7.37*  CALCIUM 8.5 8.3* 8.8  PHOS 6.7* 7.2*  --    Liver Function Tests:  Recent Labs Lab 04/17/14 0930 04/18/14 0505 04/20/14 0711  ALBUMIN 2.7* 2.8* 3.0*   No results found for this basename: LIPASE, AMYLASE,  in the last 168 hours No results found for this basename: AMMONIA,  in the last 168 hours CBC:  Recent Labs Lab 04/18/14 0505  WBC 5.5  HGB 10.0*  HCT 31.6*  MCV 95.2  PLT 126*   Cardiac Enzymes: No results found for this basename: CKTOTAL, CKMB, CKMBINDEX, TROPONINI,  in the last 168 hours BNP: No components found with this basename: POCBNP,  CBG: No results found for this basename: GLUCAP,  in the  last 168 hours  No results found for this or any previous visit (from the past 240 hour(s)).   Studies: Ir Fluoro Guide Cv Line Right  04/20/2014   CLINICAL DATA:  Renal failure and need for tunneled dialysis catheter.  EXAM: TUNNELED CENTRAL VENOUS HEMODIALYSIS CATHETER PLACEMENT WITH ULTRASOUND AND FLUOROSCOPIC GUIDANCE  ANESTHESIA/SEDATION: 2.0 mg IV Versed; 100 mcg IV Fentanyl.  Total Moderate Sedation Time  15 minutes.  MEDICATIONS: 1 g IV vancomycin. Vancomycin was given within two hours of incision. Vancomycin was given due to an antibiotic allergy.  FLUOROSCOPY TIME:  24 seconds.  PROCEDURE: The procedure, risks, benefits, and alternatives were explained to the patient's brother. Questions regarding the procedure were encouraged and answered. The patient's brother understands and consents to the procedure.  The right neck and chest were prepped with chlorhexidine in a sterile fashion, and a sterile drape was applied covering the operative field. Maximum barrier sterile technique with sterile gowns and gloves were used for  the procedure. Local anesthesia was provided with 1% lidocaine.  Ultrasound was used to confirm patency of the right internal jugular vein. After creating a small venotomy incision, a 21 gauge needle was advanced into the right internal jugular vein under direct, real-time ultrasound guidance. Ultrasound image documentation was performed. After securing guidewire access, an 8 Fr dilator was placed. A J-wire was kinked to measure appropriate catheter length.  A Bard HemoSplit tunneled hemodialysis catheter measuring 23 cm from tip to cuff was chosen for placement. This was tunneled in a retrograde fashion from the chest wall to the venotomy incision.  At the venotomy, serial dilatation was performed and a 16 Fr peel-away sheath was placed over a guidewire. The catheter was then placed through the sheath and the sheath removed. Final catheter positioning was confirmed and documented with  a fluoroscopic spot image. The catheter was aspirated, flushed with saline, and injected with appropriate volume heparin dwells.  The venotomy incision was closed with subcutaneous 3-0 Monocryl and subcuticular 4-0 Vicryl. Dermabond was applied to the incision. The catheter exit site was secured with 0-Prolene retention sutures.  COMPLICATIONS: None.  No pneumothorax.  FINDINGS: After catheter placement, the tips lie in the right atrium. The catheter aspirates normally and is ready for immediate use.  IMPRESSION: Placement of tunneled hemodialysis catheter via the right internal jugular vein. The catheter tips lie in the right atrium. The catheter is ready for immediate use.   Electronically Signed   By: Aletta Edouard M.D.   On: 04/20/2014 16:43   Ir US Guide Vasc Access Right  04/20/2014   CLINICAL DATA:  Renal failure and need for tunneled dialysis catheter.  EXAM: TUNNELED CENTRAL VENOUS HEMODIALYSIS CATHETER PLACEMENT WITH ULTRASOUND AND FLUOROSCOPIC GUIDANCE  ANESTHESIA/SEDATION: 2.0 mg IV Versed; 100 mcg IV Fentanyl.  Total Moderate Sedation Time  15 minutes.  MEDICATIONS: 1 g IV vancomycin. Vancomycin was given within two hours of incision. Vancomycin was given due to an antibiotic allergy.  FLUOROSCOPY TIME:  24 seconds.  PROCEDURE: The procedure, risks, benefits, and alternatives were explained to the patient's brother. Questions regarding the procedure were encouraged and answered. The patient's brother understands and consents to the procedure.  The right neck and chest were prepped with chlorhexidine in a sterile fashion, and a sterile drape was applied covering the operative field. Maximum barrier sterile technique with sterile gowns and gloves were used for the procedure. Local anesthesia was provided with 1% lidocaine.  Ultrasound was used to confirm patency of the right internal jugular vein. After creating a small venotomy incision, a 21 gauge needle was advanced into the right internal jugular  vein under direct, real-time ultrasound guidance. Ultrasound image documentation was performed. After securing guidewire access, an 8 Fr dilator was placed. A J-wire was kinked to measure appropriate catheter length.  A Bard HemoSplit tunneled hemodialysis catheter measuring 23 cm from tip to cuff was chosen for placement. This was tunneled in a retrograde fashion from the chest wall to the venotomy incision.  At the venotomy, serial dilatation was performed and a 16 Fr peel-away sheath was placed over a guidewire. The catheter was then placed through the sheath and the sheath removed. Final catheter positioning was confirmed and documented with a fluoroscopic spot image. The catheter was aspirated, flushed with saline, and injected with appropriate volume heparin dwells.  The venotomy incision was closed with subcutaneous 3-0 Monocryl and subcuticular 4-0 Vicryl. Dermabond was applied to the incision. The catheter exit site was secured with 0-Prolene retention  sutures.  COMPLICATIONS: None.  No pneumothorax.  FINDINGS: After catheter placement, the tips lie in the right atrium. The catheter aspirates normally and is ready for immediate use.  IMPRESSION: Placement of tunneled hemodialysis catheter via the right internal jugular vein. The catheter tips lie in the right atrium. The catheter is ready for immediate use.   Electronically Signed   By: Aletta Edouard M.D.   On: 04/20/2014 16:43    Scheduled Meds: . amLODipine  10 mg Oral Daily  . carvedilol  25 mg Oral BID WC  . cloNIDine  0.3 mg Oral TID  . darbepoetin (ARANESP) injection - DIALYSIS  200 mcg Intravenous Q Thu-HD  . feeding supplement (ENSURE)  1 Container Oral BID  . hydrALAZINE  100 mg Oral 3 times per day  . isosorbide mononitrate  30 mg Oral Daily  . levETIRAcetam  500 mg Oral BID  . lisinopril  10 mg Oral BID  . multivitamin  1 tablet Oral QHS  . phenytoin  200 mg Oral BID

## 2014-04-20 NOTE — Progress Notes (Signed)
Rehab admissions - I continue to follow pt's case and spoke with Dr. Sunnie Nielsen about pt's status. Pt is scheduled for Perm HD IJ catheter today with interventional radiology and then is planned to have dialysis afterward.  We will follow pt's progress and consider bringing him to CIR once IJ catheter is in placed and HD completed as he has not had dialysis in several days. This is also dependent on our bed availability.  Please call me with any questions. Thanks.  Juliann Mule, PT Rehabilitation Admissions Coordinator [phone]

## 2014-04-20 NOTE — Procedures (Signed)
Procedure:  Tunneled hemodialysis catheter placement Access:  Right IJ vein Findings:  23 cm Bard Hemosplit cath placed.  Tips in RA.  No PTX.  Cath ready for use.

## 2014-04-20 NOTE — Progress Notes (Signed)
MEDICATION RELATED CONSULT NOTE - Follow-Up   Pharmacy Consult for phenytoin Indication: seizures  Allergies  Allergen Reactions  . Amoxicillin Hives    Patient Measurements: Height: 5\' 10"  (177.8 cm) Weight: 148 lb 9.4 oz (67.4 kg) IBW/kg (Calculated) : 73   Vital Signs: Temp: 98.9 F (37.2 C) (06/02 0911) Temp src: Oral (06/02 0911) BP: 189/120 mmHg (06/02 0911) Pulse Rate: 83 (06/02 0911) Intake/Output from previous day: 06/01 0701 - 06/02 0700 In: 540 [P.O.:540] Out: 750 [Urine:750] Intake/Output from this shift:    Labs:  Recent Labs  04/18/14 0505 04/19/14 1056 04/20/14 0711  WBC 5.5  --   --   HGB 10.0*  --   --   HCT 31.6*  --   --   PLT 126*  --   --   CREATININE 7.24* 7.37*  --   PHOS 7.2*  --   --   ALBUMIN 2.8*  --  3.0*   Estimated Creatinine Clearance: 12.8 ml/min (by C-G formula based on Cr of 7.37).  Lab Results  Component Value Date   PHENYTOIN 7.6* - corrects to 19 04/20/2014    Assessment: 67 YOM originally admitted to ICU with seizures and ICH. He was started on phenytoin on 5/16 by neurology who has now signed off.  Today's phenytoin level is 7.6, which corrects to 19 which is within goal range.  No seizures noted in chart notes.  Goal of Therapy:  Total corrected phenytoin level 10-20mg /L  Plan:  - Continue phenytoin ER to 200mg  BID. - Will recheck phenytoin level/albumin as needed.  Tad Moore, BCPS  Clinical Pharmacist Pager [phone]  04/20/2014 9:47 AM

## 2014-04-20 NOTE — Progress Notes (Signed)
Rehab admissions - I have been following pt's case and have been in communication with pt's RN and Dr. Sunnie Nielsen. Pt had R IJ HD catheter placed and is to have dialysis later.  I spoke with pt's sister-in-law and fiancee in pt's room (pt was gone to procedure/dialysis) and they are understanding of the tentative plan to bring pt to inpatient rehab tomorrow pending his medical stability and bed availability.   I updated pt's RN as well and Annette/Cheryl case managers as well. I will check on pt status tomorrow.  Please call me with any questions. Thanks.  Juliann Mule, PT Rehabilitation Admissions Coordinator [phone]'

## 2014-04-21 ENCOUNTER — Inpatient Hospital Stay (HOSPITAL_COMMUNITY)
Admission: RE | Admit: 2014-04-21 | Discharge: 2014-04-26 | DRG: 945 | Disposition: A | Discharge status: Hospice | Payer: Medicaid Other | Source: Intra-hospital | Attending: Physical Medicine & Rehabilitation | Admitting: Physical Medicine & Rehabilitation

## 2014-04-21 DIAGNOSIS — R066 Hiccough: Secondary | ICD-10-CM | POA: Diagnosis present

## 2014-04-21 DIAGNOSIS — D631 Anemia in chronic kidney disease: Secondary | ICD-10-CM

## 2014-04-21 DIAGNOSIS — R451 Restlessness and agitation: Secondary | ICD-10-CM

## 2014-04-21 DIAGNOSIS — M359 Systemic involvement of connective tissue, unspecified: Secondary | ICD-10-CM | POA: Diagnosis present

## 2014-04-21 DIAGNOSIS — N186 End stage renal disease: Secondary | ICD-10-CM | POA: Diagnosis present

## 2014-04-21 DIAGNOSIS — I619 Nontraumatic intracerebral hemorrhage, unspecified: Secondary | ICD-10-CM

## 2014-04-21 DIAGNOSIS — Z79899 Other long term (current) drug therapy: Secondary | ICD-10-CM

## 2014-04-21 DIAGNOSIS — N2581 Secondary hyperparathyroidism of renal origin: Secondary | ICD-10-CM | POA: Diagnosis present

## 2014-04-21 DIAGNOSIS — M358 Other specified systemic involvement of connective tissue: Secondary | ICD-10-CM

## 2014-04-21 DIAGNOSIS — M3589 Other specified systemic involvement of connective tissue: Secondary | ICD-10-CM

## 2014-04-21 DIAGNOSIS — Z5189 Encounter for other specified aftercare: Principal | ICD-10-CM

## 2014-04-21 DIAGNOSIS — Z8249 Family history of ischemic heart disease and other diseases of the circulatory system: Secondary | ICD-10-CM

## 2014-04-21 DIAGNOSIS — N039 Chronic nephritic syndrome with unspecified morphologic changes: Secondary | ICD-10-CM

## 2014-04-21 DIAGNOSIS — I509 Heart failure, unspecified: Secondary | ICD-10-CM | POA: Diagnosis present

## 2014-04-21 DIAGNOSIS — Z992 Dependence on renal dialysis: Secondary | ICD-10-CM

## 2014-04-21 DIAGNOSIS — R4589 Other symptoms and signs involving emotional state: Secondary | ICD-10-CM | POA: Diagnosis present

## 2014-04-21 DIAGNOSIS — R4701 Aphasia: Secondary | ICD-10-CM | POA: Diagnosis present

## 2014-04-21 DIAGNOSIS — I10 Essential (primary) hypertension: Secondary | ICD-10-CM

## 2014-04-21 DIAGNOSIS — I120 Hypertensive chronic kidney disease with stage 5 chronic kidney disease or end stage renal disease: Secondary | ICD-10-CM | POA: Diagnosis present

## 2014-04-21 DIAGNOSIS — Z515 Encounter for palliative care: Secondary | ICD-10-CM

## 2014-04-21 DIAGNOSIS — G40909 Epilepsy, unspecified, not intractable, without status epilepticus: Secondary | ICD-10-CM | POA: Diagnosis present

## 2014-04-21 DIAGNOSIS — G9340 Encephalopathy, unspecified: Secondary | ICD-10-CM | POA: Diagnosis present

## 2014-04-21 DIAGNOSIS — R627 Adult failure to thrive: Secondary | ICD-10-CM | POA: Diagnosis present

## 2014-04-21 DIAGNOSIS — I428 Other cardiomyopathies: Secondary | ICD-10-CM | POA: Diagnosis present

## 2014-04-21 DIAGNOSIS — Z66 Do not resuscitate: Secondary | ICD-10-CM | POA: Diagnosis not present

## 2014-04-21 DIAGNOSIS — Z9119 Patient's noncompliance with other medical treatment and regimen: Secondary | ICD-10-CM

## 2014-04-21 DIAGNOSIS — D649 Anemia, unspecified: Secondary | ICD-10-CM | POA: Diagnosis present

## 2014-04-21 DIAGNOSIS — I5030 Unspecified diastolic (congestive) heart failure: Secondary | ICD-10-CM | POA: Diagnosis present

## 2014-04-21 DIAGNOSIS — Z91199 Patient's noncompliance with other medical treatment and regimen due to unspecified reason: Secondary | ICD-10-CM

## 2014-04-21 DIAGNOSIS — Z8673 Personal history of transient ischemic attack (TIA), and cerebral infarction without residual deficits: Secondary | ICD-10-CM

## 2014-04-21 DIAGNOSIS — R1310 Dysphagia, unspecified: Secondary | ICD-10-CM | POA: Diagnosis present

## 2014-04-21 DIAGNOSIS — IMO0002 Reserved for concepts with insufficient information to code with codable children: Secondary | ICD-10-CM | POA: Diagnosis present

## 2014-04-21 DIAGNOSIS — Z823 Family history of stroke: Secondary | ICD-10-CM

## 2014-04-21 DIAGNOSIS — Z881 Allergy status to other antibiotic agents status: Secondary | ICD-10-CM

## 2014-04-21 MED ORDER — DARBEPOETIN ALFA-POLYSORBATE 100 MCG/0.5ML IJ SOLN
100.0000 ug | INTRAMUSCULAR | Status: DC
Start: 2014-04-27 — End: 2014-04-26

## 2014-04-21 MED ORDER — LISINOPRIL 20 MG PO TABS
20.0000 mg | ORAL_TABLET | Freq: Every day | ORAL | Status: DC
  Administered 2014-04-21 – 2014-04-25 (×4): 20 mg via ORAL
  Filled 2014-04-21 (×6): qty 1

## 2014-04-21 MED ORDER — RENA-VITE PO TABS
1.0000 | ORAL_TABLET | Freq: Every day | ORAL | Status: DC
  Administered 2014-04-21 – 2014-04-24 (×4): 1 via ORAL
  Filled 2014-04-21 (×5): qty 1

## 2014-04-21 MED ORDER — HYDRALAZINE HCL 50 MG PO TABS
100.0000 mg | ORAL_TABLET | Freq: Three times a day (TID) | ORAL | Status: DC
  Administered 2014-04-21 – 2014-04-26 (×9): 100 mg via ORAL
  Filled 2014-04-21 (×17): qty 2

## 2014-04-21 MED ORDER — QUETIAPINE FUMARATE 25 MG PO TABS
25.0000 mg | ORAL_TABLET | Freq: Two times a day (BID) | ORAL | Status: DC
Start: 2014-04-21 — End: 2014-04-22
  Administered 2014-04-21 – 2014-04-22 (×2): 25 mg via ORAL
  Filled 2014-04-21 (×4): qty 1

## 2014-04-21 MED ORDER — ACETAMINOPHEN 325 MG PO TABS
325.0000 mg | ORAL_TABLET | ORAL | Status: DC | PRN
  Administered 2014-04-24: 650 mg via ORAL
  Filled 2014-04-21: qty 2

## 2014-04-21 MED ORDER — AMLODIPINE BESYLATE 10 MG PO TABS
10.0000 mg | ORAL_TABLET | Freq: Every day | ORAL | Status: DC
  Administered 2014-04-21: 10 mg via ORAL
  Filled 2014-04-21 (×2): qty 1

## 2014-04-21 MED ORDER — LEVETIRACETAM 100 MG/ML PO SOLN
500.0000 mg | Freq: Two times a day (BID) | ORAL | Status: DC
  Administered 2014-04-21 – 2014-04-26 (×10): 500 mg via ORAL
  Filled 2014-04-21 (×12): qty 5

## 2014-04-21 MED ORDER — ISOSORBIDE MONONITRATE ER 30 MG PO TB24
30.0000 mg | ORAL_TABLET | Freq: Every day | ORAL | Status: DC
  Administered 2014-04-22 – 2014-04-26 (×4): 30 mg via ORAL
  Filled 2014-04-21 (×6): qty 1

## 2014-04-21 MED ORDER — MINOXIDIL 2.5 MG PO TABS
2.5000 mg | ORAL_TABLET | Freq: Two times a day (BID) | ORAL | Status: DC
  Administered 2014-04-21 – 2014-04-26 (×8): 2.5 mg via ORAL
  Filled 2014-04-21 (×12): qty 1

## 2014-04-21 MED ORDER — LIDOCAINE-PRILOCAINE 2.5-2.5 % EX CREA: 1.0000 "application " | TOPICAL_CREAM | CUTANEOUS | Status: DC | PRN

## 2014-04-21 MED ORDER — ONDANSETRON HCL 4 MG PO TABS: 4.0000 mg | ORAL_TABLET | Freq: Four times a day (QID) | ORAL | Status: DC | PRN

## 2014-04-21 MED ORDER — NEPRO/CARBSTEADY PO LIQD: 237.0000 mL | Freq: Three times a day (TID) | ORAL | Status: DC

## 2014-04-21 MED ORDER — ONDANSETRON HCL 4 MG/2ML IJ SOLN: 4.0000 mg | Freq: Four times a day (QID) | INTRAMUSCULAR | Status: DC | PRN

## 2014-04-21 MED ORDER — SEVELAMER CARBONATE 800 MG PO TABS
800.0000 mg | ORAL_TABLET | Freq: Three times a day (TID) | ORAL | Status: DC
  Administered 2014-04-21 – 2014-04-23 (×4): 800 mg via ORAL
  Filled 2014-04-21 (×11): qty 1

## 2014-04-21 MED ORDER — CARVEDILOL 25 MG PO TABS
25.0000 mg | ORAL_TABLET | Freq: Two times a day (BID) | ORAL | Status: DC
  Administered 2014-04-22 – 2014-04-26 (×7): 25 mg via ORAL
  Filled 2014-04-21 (×12): qty 1

## 2014-04-21 MED ORDER — CHLORPROMAZINE HCL 10 MG PO TABS
10.0000 mg | ORAL_TABLET | Freq: Three times a day (TID) | ORAL | Status: DC | PRN
  Filled 2014-04-21: qty 1

## 2014-04-21 MED ORDER — FUROSEMIDE 80 MG PO TABS
80.0000 mg | ORAL_TABLET | Freq: Two times a day (BID) | ORAL | Status: DC
  Administered 2014-04-21 – 2014-04-22 (×2): 80 mg via ORAL
  Filled 2014-04-21 (×4): qty 1

## 2014-04-21 MED ORDER — LIDOCAINE-PRILOCAINE 2.5-2.5 % EX CREA: TOPICAL_CREAM | CUTANEOUS | Status: DC | PRN

## 2014-04-21 MED ORDER — HEPARIN SODIUM (PORCINE) 1000 UNIT/ML DIALYSIS: 1000.0000 [IU] | INTRAMUSCULAR | Status: DC | PRN

## 2014-04-21 MED ORDER — NEPRO/CARBSTEADY PO LIQD: 237.0000 mL | Freq: Every day | ORAL | Status: DC

## 2014-04-21 MED ORDER — DOXERCALCIFEROL 4 MCG/2ML IV SOLN: 2.0000 ug | INTRAVENOUS | Status: DC

## 2014-04-21 MED ORDER — CLONIDINE HCL 0.3 MG PO TABS
0.3000 mg | ORAL_TABLET | Freq: Three times a day (TID) | ORAL | Status: DC
  Administered 2014-04-22: 0.3 mg via ORAL
  Filled 2014-04-21 (×5): qty 1

## 2014-04-21 MED ORDER — ENSURE PUDDING PO PUDG
1.0000 | Freq: Two times a day (BID) | ORAL | Status: DC
  Administered 2014-04-22 – 2014-04-24 (×4): 1 via ORAL

## 2014-04-21 MED ORDER — SORBITOL 70 % SOLN: 30.0000 mL | Freq: Every day | Status: DC | PRN

## 2014-04-21 MED ORDER — STARCH (THICKENING) PO POWD
ORAL | Status: DC | PRN
  Filled 2014-04-21: qty 227

## 2014-04-21 MED ORDER — NEPRO/CARBSTEADY PO LIQD: 237.0000 mL | ORAL | Status: DC | PRN

## 2014-04-21 MED ORDER — PHENYTOIN SODIUM EXTENDED 100 MG PO CAPS
200.0000 mg | ORAL_CAPSULE | Freq: Two times a day (BID) | ORAL | Status: DC
  Administered 2014-04-21 – 2014-04-24 (×6): 200 mg via ORAL
  Filled 2014-04-21 (×8): qty 2

## 2014-04-21 NOTE — Progress Notes (Signed)
[facility] Progress Note  Assessment/Plan: 1. Acute hemorrhagic CVA - left temporal lobe - hopefully for transfer to Rehab once cath placed and pt is dialyzed. 2. ESRD - MWF - off schedule - can't cooperate at this time to use AVF due to unsafe aggressive behavioral inspite of seoquel; lasix 80 bid for now; will change to TTS while on rehab. Has perm cath in now - this is a temporary fix -- [facility] will not accept back to OP center on catheter-dialysis unless all avenues for perm access have been exhausted 3. Anemia - Hgb 10 s/p transfusion 5/21 - Aranesp  100 scheduled for Monday - give today 6/2 4. Secondary hyperparathyroidism - on hectorol/renvela restarted - recheck labs pre HD today. 5. Malignant/ HTN/volume - volume outpt increased with ^ in lasix but no effect on BP; d/w with Dr. Arlean Hopping - add minoxidil 2.5 bid with parameters to hold other meds if SBP < 120; titrate EDW more on HD 6. Nutrition - NPO for procedure, ST following 7. Dispo- ok for transfer to CIR from renal standpoint   Vinson Moselle MD pager [phone]    cell [phone] 04/21/2014, 11:48 AM  Subjective:     Objective Filed Vitals:   04/20/14 2000 04/20/14 2033 04/21/14 0414 04/21/14 0900  BP: 176/116 188/117 141/85 155/86  Pulse: 78 84 82 95  Temp: 98.4 F (36.9 C) 98.2 F (36.8 C) 98.3 F (36.8 C) 98.3 F (36.8 C)  TempSrc: Oral Oral Oral Oral  Resp: 16 18 17 18   Height:      Weight:  63.7 kg (140 lb 6.9 oz)    SpO2:  99% 97% 98%   Physical Exam Awake, eyes open, will answer with "yes" or "no" intermittently, blank staring also at times No jvd Chest clear bilat RRR no rub or gallop  Dialysis Orders:[facility]  4h 79kg 2/2.25 Bath 300/800 Heparin none due to bleed (was 8000) LUA AVF  Hectorol 4 Aranesp 100 Q Wed Venofer 100/hd thru    Additional Objective Labs: Basic Metabolic Panel:  Recent Labs Lab 04/17/14 0930 04/18/14 0505 04/19/14 1056  NA 143 142 140  K 6.0* 4.6 5.2  CL  106 103 103  CO2 21 20 17*  GLUCOSE 83 82 96  BUN 60* 72* 84*  CREATININE 6.99* 7.24* 7.37*  CALCIUM 8.5 8.3* 8.8  PHOS 6.7* 7.2*  --    Liver Function Tests:  Recent Labs Lab 04/17/14 0930 04/18/14 0505 04/20/14 0711  ALBUMIN 2.7* 2.8* 3.0*   CBC:  Recent Labs Lab 04/18/14 0505  WBC 5.5  HGB 10.0*  HCT 31.6*  MCV 95.2  PLT 126*   CBG: No results found for this basename: GLUCAP,  in the last 168 hoursMedications:   . amLODipine  10 mg Oral Q2200  . carvedilol  25 mg Oral BID WC  . cloNIDine  0.3 mg Oral TID  . darbepoetin (ARANESP) injection - DIALYSIS  100 mcg Intravenous Q Tue-HD  . doxercalciferol  2 mcg Intravenous Q T,Th,Sa-HD  . feeding supplement (ENSURE)  1 Container Oral BID  . feeding supplement (NEPRO CARB STEADY)  237 mL Oral Daily  . furosemide  80 mg Oral BID  . hydrALAZINE  100 mg Oral 3 times per day  . isosorbide mononitrate  30 mg Oral Daily  . levETIRAcetam  500 mg Oral BID  . lisinopril  20 mg Oral QHS  . minoxidil  2.5 mg Oral BID  . multivitamin  1 tablet Oral QHS  .  phenytoin  200 mg Oral BID  . QUEtiapine  25 mg Oral BID  . sevelamer carbonate  800 mg Oral TID WC

## 2014-04-21 NOTE — Discharge Summary (Signed)
[facility]  Physician Discharge Summary   Patient ID: Matthew Beard MRN: [id number] DOB/AGE: 1975/11/29 38 y.o.  Admit date: 03/29/2014 Discharge date: 04/21/2014  PCP: [name], MD  DISCHARGE DIAGNOSES:  Principal Problem:   ICH (intracerebral hemorrhage) Active Problems:   Uncontrolled hypertension   Anemia in chronic kidney disease   Cardiomyopathy EF 25-30%- etiol undetermined- EF varies- 20% Aug 2014, 40% Nov 2014, 50% March 2015   ESRD- HD since April 2015   Noncompliance with medication regimen in past- mult adm for uncontrolled HTN   Convulsions/seizures   Encephalopathy   Aspiration pneumonia   Dysphagia, unspecified(787.20)   RECOMMENDATIONS FOR OUTPATIENT FOLLOW UP: 1. Patient being discharged to inpatient rehabilitation 2. Medication change: Zaroxolyn and Lasix and potassium being discontinued 3. Medication change: Clonidine being increased to 0.3 mg 3 times a day  New diet recommendation: Dysphagia 3 (mechanical soft);Nectar-thick liquid, No straw, Medication Administration: Crushed with puree  Supervision: Full supervision/cueing for compensatory strategies;Patient able to self feed  Compensations: Slow rate;Small sips/bites;Check for pocketing  Postural Changes and/or Swallow Maneuvers: Seated upright 90 degrees   DISCHARGE CONDITION: fair  Filed Weights   04/18/14 2100 04/19/14 2042 04/20/14 2033  Weight: 67.4 kg (148 lb 9.4 oz) 67.4 kg (148 lb 9.4 oz) 63.7 kg (140 lb 6.9 oz)    INITIAL HISTORY: Patient is a 38 year old African American male past medical history of end-stage renal disease with noncompliant with dialysis, hypertension, nonischemic cardiomyopathy and secondary congestive heart failure who was brought into the emergency room on 5/11 after he was witnessed at home to fall backwards and then have was reported seizure-like activity. In the emergency room he is found to have blood pressures as high as 249/144 and a head CT revealed  a small focal hemorrhage at the junction of the left parietal and temporal lobes. Patient was intubated for protection of airway and as well as agitation. He had been just discharged from the hospital 2 weeks prior for volume overload after he was noncompliance with dialysis at that time.   Hospital Course:  In the ICU, neurology consulted. Recommendation was for systolic blood pressure between 120-160 which proved to be difficult requiring multiple medication drips. He was put on Keppra for seizures prevention. MRI done 5/13 noted a 1.8 acute left temporal lobe bleed with multiple microinfarcts and bleed. Patient was suspected of having aspiration pneumonia and started on antibiotics which completed its course on 5/18. Echocardiogram done noted drop in ejection fraction from 50% down to 25% as well as global hypokinesis. Cardiology was consulted and felt the patient likely had hypertensive component out cardiomyopathy. Patient was continued on CVVH by neurology.   Sedation was discontinued but patient was not awake enough for extubation. Palliative care was consulted and met with family on 5/15. Family was able to understand poor long-term prognosis. Patient was kept as full code. He was able to be extubated on 5/16. At this point, CVVH was stopped and conventional hemodialysis was started on 5/18. He tolerated this well and transferred to the renal 4 on 5/19 and hospitalists assumed care on 5/20. He appears to be more awake and answers questions slowly, but appropriately. Patient was evaluated by physical medicine and rehabilitation who felt the patient appropriate for inpatient rehabilitation. He'll be transferred there when bed is available. Patient was evaluated by speech therapy his current diet recommendation is for dysphagia 3 diet with nectar thick liquids   Patient's blood pressure remained difficult to control. He's been continued on his  home meds with the exception of diuretics which have been  discontinued. His clonidine has been increased to 0.3 mg 3 times a day. Patient gets when necessary IV hydralazine for additional blood pressure control   ICH (intracerebral hemorrhage)/acute hemorrhagic CVA  Stabilized as discussed above Repeated CT head showed 2 areas of small hemorrhage,new. Neurology was re-consulted for evaluation and for prognosis of recovery. Theye did not have new recommendations. Appreciate Psych evaluation help with medications. Seroquel increase to 25 mg BID on 5-30.  Patient was more verbal, his mentation fluctuates.   Dysphagia  Dysphagia 3 is current diet  Uncontrolled hypertension  Continue current regimen.  BP not optimally controlled but will likely take some time.  Anemia in chronic kidney disease  Status post 1 unit PRBC transfusion on 5/21. Hemoglobin stable. Continue Aranesp.   Thrombocytopenia  Platelets are now WNL  Cardiomyopathy  Most recent 2 D ECHO in 03/2014 showed EHF 25-30%  ESRD  HD per renal. IJ catheter for dialysis placed by IR on 6/2. He was dialysed 6/2. Plan is to change to TTS regimen while he is in rehab.   Acute respiratory failure with hypoxia / Aspiration pneumonia  Resolved. Secondary to aspiration pneumonia   Convulsions/seizures  On Keppra and Dilantin  Acute encephalopathy  Agitation at times. Started on Seroquel. Dose increased to 25 mg BID on 5-30.   Overall he remains stable. He is ok for transfer to inpatient rehab.  Procedures:  Endotracheal tube 5/12-5/15  Right IJ trialysis catheter 5/12-5/19  Echocardiogram done 5/12: Decreased ejection fraction of 62-94%, diastolic flattening, moderate dilatation of left and right atria and ventricles. Severe left particular hypertrophy HD catheter in Right IJ on 04/20/14  Consultations:  Critical care  Nephrology  Neurology  Cardiology  Palliative care  Physical medicine and rehabilitation    PERTINENT LABS: The results of significant diagnostics from this  hospitalization (including imaging, microbiology, ancillary and laboratory) are listed below for reference.    Labs: Basic Metabolic Panel:  Recent Labs Lab 04/17/14 0930 04/18/14 0505 04/19/14 1056  NA 143 142 140  K 6.0* 4.6 5.2  CL 106 103 103  CO2 21 20 17*  GLUCOSE 83 82 96  BUN 60* 72* 84*  CREATININE 6.99* 7.24* 7.37*  CALCIUM 8.5 8.3* 8.8  PHOS 6.7* 7.2*  --    Liver Function Tests:  Recent Labs Lab 04/17/14 0930 04/18/14 0505 04/20/14 0711  ALBUMIN 2.7* 2.8* 3.0*   CBC:  Recent Labs Lab 04/18/14 0505  WBC 5.5  HGB 10.0*  HCT 31.6*  MCV 95.2  PLT 126*   BNP: BNP (last 3 results)  Recent Labs  07/04/13 0640 02/11/14 0800 03/08/14 1224  PROBNP 6732.0* >70000.0* >70000.0*    IMAGING STUDIES Ct Angio Head W/cm &/or Beard Cm  03/30/2014   CLINICAL DATA:  Intracranial hemorrhage.  Rule out aneurysm.  EXAM: CT ANGIOGRAPHY HEAD  TECHNIQUE: Multidetector CT imaging of the head was performed using the standard protocol during bolus administration of intravenous contrast. Multiplanar CT image reconstructions and MIPs were obtained to evaluate the vascular anatomy.  CONTRAST:  67m OMNIPAQUE IOHEXOL 350 MG/ML SOLN  COMPARISON:  Head CT 03/29/2014  FINDINGS: 2.0 x 1.6 cm acute parenchymal hemorrhage in the posterior left temporal lobe with mild surrounding edema does not appear significantly changed. There is no evidence of new intracranial hemorrhage. Patchy hypoattenuation in the pons is unchanged from 06/29/2013, compatible with chronic ischemic change. Old lacunar infarcts are noted in the thalami. Moderate periventricular white  matter chronic small vessel ischemic disease is unchanged. Remote infarct is also noted adjacent to the left frontal MRI. No abnormal enhancement is identified. Ventricles are mildly prominent for age, unchanged. There is no midline shift or extra-axial fluid collection. Orbits are unremarkable. Mild right frontal sinus mucosal thickening is  noted. There is a small amount of right maxillary sinus fluid.  The visualized distal vertebral arteries are patent with the left being dominant. PICA origins are patent. AICA and SCA origins are patent. Basilar artery is patent without stenosis. There is a fetal origin of the left PCA with a hypoplastic left P1 segment. Patent right posterior communicating artery is not identified. There is mild bilateral PCA branch vessel irregularity without significant proximal stenosis.  Internal carotid arteries are patent from skull base to carotid termini. There is mild ACA and MCA branch vessel irregularity bilaterally without evidence of significant proximal stenosis. No intracranial aneurysm is identified.  Review of the MIP images confirms the above findings.  IMPRESSION: 1. No intracranial aneurysm identified. 2. Mild anterior and posterior circulation branches vessel irregularity, suggestive of mild intracranial atherosclerosis. No evidence of significant proximal stenosis. 3. Unchanged, acute left temporal lobe parenchymal hemorrhage.   Electronically Signed   By: Logan Bores   On: 03/30/2014 11:48   Ct Head Beard Contrast  04/16/2014   CLINICAL DATA:  Follow-up intracranial hemorrhage  EXAM: CT HEAD WITHOUT CONTRAST  TECHNIQUE: Contiguous axial images were obtained from the base of the skull through the vertex without intravenous contrast.  COMPARISON:  None.  FINDINGS: There is a 12 x 12 mm intraparenchymal hematoma in the left temporal lobe with mild surrounding vasogenic edema which is improved compared with the prior examination of 03/30/2014. There are 2 foci of punctate hyperdensity in the left posterior parietal lobe consistent with tiny foci of hemorrhage. There is no evidence of mass effect, midline shift, or extra-axial fluid collections. There is no evidence of a space-occupying lesion . There is no evidence of a cortical-based area of acute infarction. Old right pontine lacunar infarct. There is  generalized cerebral atrophy. There is periventricular white matter low attenuation likely secondary to microangiopathy.  The ventricles and sulci are appropriate for the patient's age. The basal cisterns are patent.  Visualized portions of the orbits are unremarkable. The visualized portions of the paranasal sinuses and mastoid air cells are unremarkable. Cerebrovascular atherosclerotic calcifications are noted.  The osseous structures are unremarkable.  IMPRESSION: There is a 12 x 12 mm intraparenchymal hematoma in the left temporal lobe with mild surrounding vasogenic edema which is improved compared with the prior examination of 03/30/2014.  There are 2 foci of punctate hyperdensity in the left posterior parietal lobe consistent with tiny foci of hemorrhage. No evidence of hydrocephalus or impending herniation.   Electronically Signed   By: Kathreen Devoid   On: 04/16/2014 18:06   Ct Head Beard Contrast  03/29/2014   CLINICAL DATA:  Confusion  EXAM: CT HEAD WITHOUT CONTRAST  TECHNIQUE: Contiguous axial images were obtained from the base of the skull through the vertex without intravenous contrast.  COMPARISON:  None.  FINDINGS: The examination is significantly limited by patient motion artifact. Mild atrophic changes and chronic white matter ischemic changes are seen. There is a focus of parenchymal hemorrhage at the junction of the left temporal and left parietal lobes which measures 1.6 x 1.6 cm. No other focal abnormality is noted.  IMPRESSION: Significantly limited exam by patient motion artifact.  A focus of hemorrhage is  noted as described above at the junction of the left temporal and parietal lobes. Mild surrounding edema is noted.  Critical Value/emergent results were called by telephone at the time of interpretation on 03/29/2014 at 11:48 PM to the emergency room physician, who verbally acknowledged these results.   Electronically Signed   By: Inez Catalina M.D.   On: 03/29/2014 23:48   Matthew Beard  Contrast  03/31/2014   CLINICAL DATA:  Intracranial hemorrhage. End-stage renal disease, non compliant. Blood pressure elevated on initial presentation.  EXAM: MRI HEAD WITHOUT CONTRAST  TECHNIQUE: Multiplanar, multiecho pulse sequences of the brain and surrounding structures were obtained without intravenous contrast.  COMPARISON:  CT head 03/29/2014.  CTA 03/30/2014.  FINDINGS: Multifocal subcentimeter areas of acute infarction affect the bilateral frontal, parietal, temporal, and occipital regions, as well as the right cerebellum. Their distribution, in the junction zones between the ACA/ MCA, and MCA/PCA vascular territories, suggests a watershed type event, possible arrhythmia or hypotensive episode. No features suggestive of PRES.  Re-demonstrated is a roughly spherical 18 mm acute hemorrhage located in the left temporal lobe with moderate surrounding edema consistent with an acute intracerebral hematoma. There is no midline shift or uncal herniation. No features suggestive of hemorrhagic metastasis. No abnormalities to suggest of vascular lesion such as aneurysm or AVM. There is an old chronic hemorrhage involving the genu of the corpus callosum. Too numerous to count microbleeds, seen best on gradient sequence throughout the cerebral hemispheres, brainstem, and cerebellum suggests that this lesion represents a complication of longstanding hypertensive cerebrovascular disease. Cerebral amyloid angiopathy is not excluded, but less favored.  Premature for age cerebral and cerebellar atrophy. There is extensive T2 and FLAIR hyperintensities throughout the periventricular and subcortical white matter, also involving the brainstem, consistent with chronic microvascular ischemic change. Flow voids are maintained in the major intracranial vessels. Large remote lacunar infarcts are seen in the brainstem, thalamus, and subcortical white matter.  Partial empty sella. No tonsillar herniation. No worrisome osseous  lesions. Marrow changes consistent with end-stage renal disease with anemia. Negative orbits. No acute sinus or mastoid disease.  IMPRESSION: Findings most consistent with an acute 1.8 cm left temporal lobe intracerebral hematoma as a complication of hypertensive cerebrovascular disease. Widespread microbleeds, chronic corpus callosum hemorrhagic infarction, multiple lacunes, and microvascular ischemic change throughout the white matter are also noted. Lobar hemorrhage from cerebral amyloid angiopathy is not excluded but less favored.  Multiple subcentimeter acute infarcts throughout the supratentorial region also affecting the right cerebellum; watershed type event is favored; correlate clinically with arrhythmia or hypotensive episode.   Electronically Signed   By: Rolla Flatten M.D.   On: 03/31/2014 12:29   Ir Fluoro Guide Cv Line Right  04/20/2014   CLINICAL DATA:  Renal failure and need for tunneled dialysis catheter.  EXAM: TUNNELED CENTRAL VENOUS HEMODIALYSIS CATHETER PLACEMENT WITH ULTRASOUND AND FLUOROSCOPIC GUIDANCE  ANESTHESIA/SEDATION: 2.0 mg IV Versed; 100 mcg IV Fentanyl.  Total Moderate Sedation Time  15 minutes.  MEDICATIONS: 1 g IV vancomycin. Vancomycin was given within two hours of incision. Vancomycin was given due to an antibiotic allergy.  FLUOROSCOPY TIME:  24 seconds.  PROCEDURE: The procedure, risks, benefits, and alternatives were explained to the patient's brother. Questions regarding the procedure were encouraged and answered. The patient's brother understands and consents to the procedure.  The right neck and chest were prepped with chlorhexidine in a sterile fashion, and a sterile drape was applied covering the operative field. Maximum barrier sterile technique with sterile  gowns and gloves were used for the procedure. Local anesthesia was provided with 1% lidocaine.  Ultrasound was used to confirm patency of the right internal jugular vein. After creating a small venotomy incision, a  21 gauge needle was advanced into the right internal jugular vein under direct, real-time ultrasound guidance. Ultrasound image documentation was performed. After securing guidewire access, an 8 Fr dilator was placed. A J-wire was kinked to measure appropriate catheter length.  A Bard HemoSplit tunneled hemodialysis catheter measuring 23 cm from tip to cuff was chosen for placement. This was tunneled in a retrograde fashion from the chest wall to the venotomy incision.  At the venotomy, serial dilatation was performed and a 16 Fr peel-away sheath was placed over a guidewire. The catheter was then placed through the sheath and the sheath removed. Final catheter positioning was confirmed and documented with a fluoroscopic spot image. The catheter was aspirated, flushed with saline, and injected with appropriate volume heparin dwells.  The venotomy incision was closed with subcutaneous 3-0 Monocryl and subcuticular 4-0 Vicryl. Dermabond was applied to the incision. The catheter exit site was secured with 0-Prolene retention sutures.  COMPLICATIONS: None.  No pneumothorax.  FINDINGS: After catheter placement, the tips lie in the right atrium. The catheter aspirates normally and is ready for immediate use.  IMPRESSION: Placement of tunneled hemodialysis catheter via the right internal jugular vein. The catheter tips lie in the right atrium. The catheter is ready for immediate use.   Electronically Signed   By: Aletta Edouard M.D.   On: 04/20/2014 16:43   Ir US Guide Vasc Access Right  04/20/2014   CLINICAL DATA:  Renal failure and need for tunneled dialysis catheter.  EXAM: TUNNELED CENTRAL VENOUS HEMODIALYSIS CATHETER PLACEMENT WITH ULTRASOUND AND FLUOROSCOPIC GUIDANCE  ANESTHESIA/SEDATION: 2.0 mg IV Versed; 100 mcg IV Fentanyl.  Total Moderate Sedation Time  15 minutes.  MEDICATIONS: 1 g IV vancomycin. Vancomycin was given within two hours of incision. Vancomycin was given due to an antibiotic allergy.  FLUOROSCOPY  TIME:  24 seconds.  PROCEDURE: The procedure, risks, benefits, and alternatives were explained to the patient's brother. Questions regarding the procedure were encouraged and answered. The patient's brother understands and consents to the procedure.  The right neck and chest were prepped with chlorhexidine in a sterile fashion, and a sterile drape was applied covering the operative field. Maximum barrier sterile technique with sterile gowns and gloves were used for the procedure. Local anesthesia was provided with 1% lidocaine.  Ultrasound was used to confirm patency of the right internal jugular vein. After creating a small venotomy incision, a 21 gauge needle was advanced into the right internal jugular vein under direct, real-time ultrasound guidance. Ultrasound image documentation was performed. After securing guidewire access, an 8 Fr dilator was placed. A J-wire was kinked to measure appropriate catheter length.  A Bard HemoSplit tunneled hemodialysis catheter measuring 23 cm from tip to cuff was chosen for placement. This was tunneled in a retrograde fashion from the chest wall to the venotomy incision.  At the venotomy, serial dilatation was performed and a 16 Fr peel-away sheath was placed over a guidewire. The catheter was then placed through the sheath and the sheath removed. Final catheter positioning was confirmed and documented with a fluoroscopic spot image. The catheter was aspirated, flushed with saline, and injected with appropriate volume heparin dwells.  The venotomy incision was closed with subcutaneous 3-0 Monocryl and subcuticular 4-0 Vicryl. Dermabond was applied to the incision. The catheter exit  site was secured with 0-Prolene retention sutures.  COMPLICATIONS: None.  No pneumothorax.  FINDINGS: After catheter placement, the tips lie in the right atrium. The catheter aspirates normally and is ready for immediate use.  IMPRESSION: Placement of tunneled hemodialysis catheter via the right  internal jugular vein. The catheter tips lie in the right atrium. The catheter is ready for immediate use.   Electronically Signed   By: Aletta Edouard M.D.   On: 04/20/2014 16:43   Dg Chest Port 1 View  04/06/2014   CLINICAL DATA:  Respiratory failure  EXAM: PORTABLE CHEST - 1 VIEW  COMPARISON:  DG CHEST 1V PORT dated 04/02/2014; DG CHEST 1V PORT dated 03/31/2014; DG CHEST 1V PORT dated 03/30/2014  FINDINGS: Grossly unchanged enlarged cardiac silhouette and mediastinal contours. Interval extubation and removal of enteric tube. Stable positioning of large bore right jugular approach presumed temporary dialysis catheter with tip overlying the mid aspect of the SVC. The pulmonary vasculature appears less distinct on present examination with cephalization of flow. There is minimal pleural parenchymal thickening about the right minor fissure. Worsening bilateral peri and infrahilar heterogeneous opacities. No pneumothorax. Unchanged bones.  IMPRESSION: 1. Interval extubation removal of enteric tube.  No pneumothorax. 2. Worsening of pulmonary edema and perihilar/bibasilar opacities, likely atelectasis.   Electronically Signed   By: Sandi Mariscal M.D.   On: 04/06/2014 07:57   Dg Chest Port 1 View  04/02/2014   CLINICAL DATA:  Endotracheal tube  EXAM: PORTABLE CHEST - 1 VIEW  COMPARISON:  DG CHEST 1V PORT dated 03/31/2014; DG CHEST 1V PORT dated 03/30/2014; DG CHEST 1V PORT dated 03/29/2014; DG CHEST 1V PORT dated 03/08/2014; DG CHEST 2 VIEW dated 02/11/2014  FINDINGS: Grossly unchanged enlarged cardiac silhouette and mediastinal contours. Stable positioning of support apparatus. No pneumothorax. Marked improved aeration of the lungs with persistent bilateral infrahilar opacities, left greater than right. Trace a sided effusion is not excluded. No definite evidence of edema. No pneumothorax. Unchanged bones.  IMPRESSION: 1.  Stable positioning of support apparatus.  No pneumothorax. 2. Marked improved aeration of the lungs  suggests resolving edema and atelectasis.   Electronically Signed   By: Sandi Mariscal M.D.   On: 04/02/2014 07:52   Dg Chest Port 1 View  03/31/2014   CLINICAL DATA:  Evaluate endotracheal tube position.  EXAM: PORTABLE CHEST - 1 VIEW  COMPARISON:  DG CHEST 1V PORT dated 03/30/2014; DG CHEST 1V PORT dated 03/30/2014; DG CHEST 1V PORT dated 03/29/2014; DG CHEST 1V PORT dated 03/08/2014  FINDINGS: Cardiopericardial silhouette is enlarged. Endotracheal tube tip is 48 mm from the carina. Right IJ vas cath present, unchanged. Enteric tube is present with the tip not visualized. Diffuse bilateral basilar predominant airspace disease, most compatible with pulmonary edema. No large pleural effusions are identified. Monitoring leads project over the chest.  IMPRESSION: 1. Support apparatus in good position, detailed above. 2. Slight worsening pulmonary aeration with lower volumes and more prominent basilar opacity, most consistent with pulmonary edema based on symmetry.   Electronically Signed   By: Dereck Ligas M.D.   On: 03/31/2014 07:57   Dg Chest Port 1 View  03/30/2014   CLINICAL DATA:  Placement of endotracheal tube, central line, and NG tube  EXAM: PORTABLE CHEST - 1 VIEW  COMPARISON:  DG CHEST 1V PORT dated 03/30/2014; DG CHEST 1V PORT dated 03/29/2014; DG CHEST 1V PORT dated 03/08/2014  FINDINGS: Endotracheal tube is identified with tip 3.5 cm above the carinal. There is a right  internal jugular central line with tip projecting over the right mainstem bronchus. An NG tube is identified crossing the gastroesophageal junction, with its tip not identified.  Mild cardiac enlargement. Hazy right lower lobe and left lower lobe opacities.  IMPRESSION: 1. Stable right lower lung zone and left lower lung zone infiltrate with improved aeration in the left upper lobe when compared to prior study. 2. Lines and tubes as described. Position of right internal jugular central line tip indeterminate. It projects over an area that  could be associated with the superior vena cava. Distinction between potential arterial or venous location cannot be made on this single projection radiograph. Correlate clinically.   Electronically Signed   By: Skipper Cliche M.D.   On: 03/30/2014 19:40   Dg Chest Port 1 View  03/30/2014   CLINICAL DATA:  Status post intubation  EXAM: PORTABLE CHEST - 1 VIEW  COMPARISON:  03/29/2014  FINDINGS: Cardiac shadow remains enlarged. Endotracheal tube is now noted 4.6 cm above the carina. Increasing infiltrative changes are noted bilaterally but particularly in the left mid lung when compared with the prior study. No acute bony abnormality is seen.  IMPRESSION: Endotracheal tube as described.  Increasing bilateral infiltrative changes.   Electronically Signed   By: Inez Catalina M.D.   On: 03/30/2014 00:21   Dg Chest Port 1 View  03/29/2014   CLINICAL DATA:  Loss of consciousness.  Dialysis patient.  EXAM: PORTABLE CHEST - 1 VIEW  COMPARISON:  DG CHEST 1V PORT dated 03/08/2014  FINDINGS: Cardiac enlargement without vascular congestion or edema. No focal consolidation or airspace disease. No blunting of costophrenic angles. No pneumothorax. No significant change since prior study.  IMPRESSION: Cardiac enlargement.  No evidence of active pulmonary disease.   Electronically Signed   By: Lucienne Capers M.D.   On: 03/29/2014 22:56   Dg Abd Portable 1v  04/02/2014   CLINICAL DATA:  Feeding tube placement  EXAM: PORTABLE ABDOMEN - 1 VIEW  COMPARISON:  None.  FINDINGS: Feeding tube tip is in the stomach. Bowel gas pattern is unremarkable. No free air is seen on this supine examination.  IMPRESSION: Feeding tube tip in stomach.   Electronically Signed   By: Lowella Grip M.D.   On: 04/02/2014 17:25    DISCHARGE EXAMINATION: Filed Vitals:   04/20/14 2000 04/20/14 2033 04/21/14 0414 04/21/14 0900  BP: 176/116 188/117 141/85 155/86  Pulse: 78 84 82 95  Temp: 98.4 F (36.9 C) 98.2 F (36.8 C) 98.3 F (36.8 C)  98.3 F (36.8 C)  TempSrc: Oral Oral Oral Oral  Resp: 16 18 17 18   Height:      Weight:  63.7 kg (140 lb 6.9 oz)    SpO2:  99% 97% 98%   General appearance: alert, distracted and no distress Resp: clear to auscultation bilaterally Cardio: regular rate and rhythm, S1, S2 normal, no murmur, click, rub or gallop GI: soft, non-tender; bowel sounds normal; no masses,  no organomegaly Neurologic: Does not follow commands well.  DISPOSITION: Inpatient Rehab  ALLERGIES:  Allergies  Allergen Reactions  . Amoxicillin Hives   Patient going to inpatient rehab on current inpatient medications. Rehab MD will determine final discharge medications.    Scheduled: . amLODipine  10 mg Oral Q2200  . carvedilol  25 mg Oral BID WC  . cloNIDine  0.3 mg Oral TID  . darbepoetin (ARANESP) injection - DIALYSIS  100 mcg Intravenous Q Tue-HD  . doxercalciferol  2 mcg Intravenous Q T,Th,Sa-HD  .  feeding supplement (ENSURE)  1 Container Oral BID  . feeding supplement (NEPRO CARB STEADY)  237 mL Oral Daily  . furosemide  80 mg Oral BID  . hydrALAZINE  100 mg Oral 3 times per day  . isosorbide mononitrate  30 mg Oral Daily  . levETIRAcetam  500 mg Oral BID  . lisinopril  20 mg Oral QHS  . minoxidil  2.5 mg Oral BID  . multivitamin  1 tablet Oral QHS  . phenytoin  200 mg Oral BID  . QUEtiapine  25 mg Oral BID  . sevelamer carbonate  800 mg Oral TID WC   Continuous:  YQI:HKVQQV chloride, sodium chloride, sodium chloride, chlorproMAZINE, diphenhydrAMINE, feeding supplement (NEPRO CARB STEADY), fentaNYL, food thickener, heparin, hydrALAZINE, labetalol, lidocaine (PF), lidocaine-prilocaine, lidocaine-prilocaine, pentafluoroprop-tetrafluoroeth  TOTAL DISCHARGE TIME: 35 mins  [facility] Pager [phone]  04/21/2014, 10:01 AM  Disclaimer: This note was dictated with voice recognition software. Similar sounding words can inadvertently be transcribed and may not be corrected upon  review.

## 2014-04-21 NOTE — Consult Note (Signed)
Chart review: Apr 2009-- chest pain, ^BP did not fill Rx's for Lotrel and HTCZ. W/U negative, CT chest neg for PE, normal LV fxn. Penile ulcer seen by urology, thought to be infectious, rx with doxy 100 bid x 3 wks  Mar 2010-- HTN urgency, medical noncompliance due to loss of insurance, dental infection, acute bronchitis  Sept 2010-- malignant HTN, acute pericarditis, CAP, anemia, tobacco use, severe LVH > d/c'd on minoxidil 5 bid + HCTZ, acei, BB and norvasc. MRA abd no RAS  Mar 2013-- HTN crisis, BP 235/157 in ED, severe HA. Rx cardene drip then oral meds. At d/c BP was 160/120 and HA's were resolved. New dx of kidney failure  Aug 2014-- HTN crisis, presented to ED with one month hx of progressive SOB, PND and orthopnea. In ED BP was very high, pt admitted to medication noncompliance. Rx'd w cardene drip and BP improved. ECHO w 20% LVEF. Had +ANA a:1280 speckled, ^RF. Repeat ordered. Urine and plasma metanephrines and normetanephrines were elevated and abd CT was done which showed no findings of adrenal mass. Diuresed with IV lasix. D/C"d home.  Oct 2014-- HTN crisis, pt presented with epistaxis and markedly elevated BP. Hx of noncompliance w medications due to financial difficulty. Treated w IV meds for BP then transitioned to PO meds. Counseled regarding compliance. CKD stage IV. Progressive.  Dec 2014-- L wrist Cimino AVF  Mar 2015-- acute on CKD IV, uncontrolled HTN and pulm edema with SOB. Diuresed with IV lasix, d/c'd on high dose po lasix and po zaroxlyn. EF 20% by echo. No HD yet, stage V CKD.  April 2015-- pt presented with SOB, DOE, not getting around the house well, anascarca w leg edema up to the waist. Pt admitted and HD initiated, edema improved, BP improved with meds and HD. D/C BP meds were norvasc, hydralzaine,, Coreg and Catapres. ^LFTs felt due to congestion from acute CHF exacerbation. D/C"d home on HD at OP tiw.   Vinson Moselle MD 04/21/2014, 8:58 AM

## 2014-04-21 NOTE — Progress Notes (Signed)
Physical Medicine and Rehabilitation Consult Reason for Consult: ICH Referring Physician: [facility]     HPI: Matthew Beard is a 38 y.o. right-handed male with history of end-stage renal disease with new start hemodialysis, nonischemic cardiomyopathy, hypertension, diastolic congestive heart failure history of chronic lacunar infarcts as well as medical noncompliance. Admitted 03/30/2014 after questionable syncopal episode with fall versus seizure. She did require intubation in the ED for agitation. She was loaded with Keppra as well as the addition of Dilantin. Cranial CT scan showed focus of parenchymal hemorrhage at the junction of the left temporal and left parietal lobe measuring 1.6 x 1.6 cm. CT angiogram head with no intracranial aneurysm identified no evidence of significant proximal stenosis. EEG consistent with generalized nonspecific cerebral dysfunction. She was extubated and 04/02/2014. Renal service followup hemodialysis as directed with latest creatinine . Followup MRI 03/31/2014 with noted widespread micro-bleeds, chronic corpus callosum hemorrhagic infarct as well as multiple subcentimeter acute infarcts throughout the supratentorial region. Presently maintained on mechanical soft nectar thick liquids. Await the final therapy evaluations. M.D. has requested physical medicine rehabilitation consult     Review of Systems  Unable to perform ROS: mental acuity  Past Medical History   Diagnosis  Date   .  Hypertension     .  CKD (chronic kidney disease), stage IV     .  Normocytic anemia     .  Pericardial effusion         Remote hx   .  Cerebrovascular disease         Chronic lacunar infarcts   .  NICM (nonischemic cardiomyopathy)     .  CHF (congestive heart failure)     .  Shortness of breath      Past Surgical History   Procedure  Laterality  Date   .  Av fistula placement  Left  10/19/2013       Procedure: ARTERIOVENOUS (AV) FISTULA CREATION- LEFT RADIOCEPHALIC;  Surgeon:  Larina Earthlyodd F Early, MD;  [facility];  Service: Vascular;  Laterality: Left;    Family History   Problem  Relation  Age of Onset   .  Hypertension  Mother     .  Stroke  Mother     .  Heart disease  Mother         Heart Disease before age 39   .  Hypertension  Father     .  Stroke  Father     .  Heart disease  Father         Heart Disease before age 39   .  Hypertension  Brother      Social History: reports that he has never smoked. He has never used smokeless tobacco. He reports that he does not drink alcohol or use illicit drugs. Allergies:   Allergies   Allergen  Reactions   .  Amoxicillin  Hives    Medications Prior to Admission   Medication  Sig  Dispense  Refill   .  amLODipine (NORVASC) 10 MG tablet  Take 1 tablet (10 mg total) by mouth daily.   30 tablet   0   .  carvedilol (COREG) 25 MG tablet  Take 1 tablet (25 mg total) by mouth 2 (two) times daily with a meal.   60 tablet   0   .  cloNIDine (CATAPRES) 0.2 MG tablet  Take 1 tablet (0.2 mg total) by mouth 3 (three) times daily.   90 tablet  0   .  furosemide (LASIX) 80 MG tablet  Take 80 mg by mouth 2 (two) times daily.         .  hydrALAZINE (APRESOLINE) 100 MG tablet  Take 1 tablet (100 mg total) by mouth 3 (three) times daily.   90 tablet   0   .  isosorbide mononitrate (IMDUR) 30 MG 24 hr tablet  Take 1 tablet (30 mg total) by mouth daily.   30 tablet   0   .  metolazone (ZAROXOLYN) 5 MG tablet  Take 5 mg by mouth daily.         .  potassium chloride (K-DUR) 10 MEQ tablet  Take 10 mEq by mouth daily.         .  sevelamer carbonate (RENVELA) 800 MG tablet  Take 2 tablets (1,600 mg total) by mouth 3 (three) times daily with meals.   90 tablet   0      Home:   Functional History: Functional Status:   Mobility:   ADL:   Cognition: Cognition Overall Cognitive Status: Impaired/Different from baseline Arousal/Alertness: Lethargic Orientation Level: Oriented to person;Oriented to place Attention: Focused Focused  Attention: Impaired Focused Attention Impairment: Verbal basic;Functional basic Memory: Impaired Memory Impairment: Storage deficit;Decreased recall of new information Awareness: Impaired Awareness Impairment: Intellectual impairment Problem Solving: Impaired Problem Solving Impairment: Verbal basic;Functional basic Safety/Judgment: Impaired Cognition Overall Cognitive Status: Impaired/Different from baseline   Blood pressure 174/90, pulse 90, temperature 97.5 F (36.4 C), temperature source Oral, resp. rate 20, height 5\' 10"  (1.778 m), weight 78.3 kg (172 lb 9.9 oz), SpO2 95.00%. Physical Exam  Constitutional: He appears well-developed.  HENT:   Head: Normocephalic.  Eyes:  Pupils round reactive to light without nystagmus  Neck: Normal range of motion. Neck supple. No thyromegaly present.  Cardiovascular: Normal rate and regular rhythm.   Respiratory: Effort normal and breath sounds normal. No respiratory distress.  GI: Soft. Bowel sounds are normal. He exhibits no distension.  Neurological:  Patient is lethargic but arousable to verbal stimuli. He was appropriate for simple yes no questions but would not initiate conversation. Very inconsistent in follow commands, delayed processing. Moves all 4's with significant delay.   Skin: Skin is warm and dry.  Psychiatric:  Flat, poor initiation     Results for orders placed during the hospital encounter of 03/29/14 (from the past 24 hour(s))   GLUCOSE, CAPILLARY     Status: None     Collection Time      04/06/14 12:10 PM       Result  Value  Ref Range     Glucose-Capillary  86   70 - 99 mg/dL    Dg Chest Port 1 View   04/06/2014   CLINICAL DATA:  Respiratory failure  EXAM: PORTABLE CHEST - 1 VIEW  COMPARISON:  DG CHEST 1V PORT dated 04/02/2014; DG CHEST 1V PORT dated 03/31/2014; DG CHEST 1V PORT dated 03/30/2014  FINDINGS: Grossly unchanged enlarged cardiac silhouette and mediastinal contours. Interval extubation and removal of enteric  tube. Stable positioning of large bore right jugular approach presumed temporary dialysis catheter with tip overlying the mid aspect of the SVC. The pulmonary vasculature appears less distinct on present examination with cephalization of flow. There is minimal pleural parenchymal thickening about the right minor fissure. Worsening bilateral peri and infrahilar heterogeneous opacities. No pneumothorax. Unchanged bones.  IMPRESSION: 1. Interval extubation removal of enteric tube.  No pneumothorax. 2. Worsening of pulmonary edema and perihilar/bibasilar opacities, likely  atelectasis.   Electronically Signed   By: Simonne Come M.D.   On: 04/06/2014 07:57     Assessment/Plan: Diagnosis: ICH Does the need for close, 24 hr/day medical supervision in concert with the patient's rehab needs make it unreasonable for this patient to be served in a less intensive setting? Yes Co-Morbidities requiring supervision/potential complications: ESRD, htn, ACD, CM Due to bladder management, bowel management, safety, skin/wound care, disease management, medication administration and pain management, does the patient require 24 hr/day rehab nursing? Yes Does the patient require coordinated care of a physician, rehab nurse, PT (1-2 hrs/day, 5 days/week), OT (1-2 hrs/day, 5 days/week) and SLP (1-2 hrs/day, 5 days/week) to address physical and functional deficits in the context of the above medical diagnosis(es)? Yes Addressing deficits in the following areas: balance, endurance, locomotion, strength, transferring, bowel/bladder control, bathing, dressing, feeding, grooming, toileting, cognition, speech, language, swallowing and psychosocial support Can the patient actively participate in an intensive therapy program of at least 3 hrs of therapy per day at least 5 days per week? Yes The potential for patient to make measurable gains while on inpatient rehab is excellent Anticipated functional outcomes upon discharge from inpatient  rehab are supervision and min assist  with PT, supervision and min assist with OT, supervision and min assist with SLP. Estimated rehab length of stay to reach the above functional goals is: 18-24 days Does the patient have adequate social supports to accommodate these discharge functional goals? Potentially Anticipated D/C setting: Home Anticipated post D/C treatments: HH therapy and Outpatient therapy Overall Rehab/Functional Prognosis: good   RECOMMENDATIONS: This patient's condition is appropriate for continued rehabilitative care in the following setting: CIR Patient has agreed to participate in recommended program. Potentially Note that insurance prior authorization may be required for reimbursement for recommended care.   Comment: Need to follow up social supports.   Ranelle Oyster, MD, [facility] Physical Medicine & Rehabilitation         04/07/2014    Revision History...     Date/Time User Action   04/08/2014 11:20 AM Ranelle Oyster, MD Sign   04/07/2014 11:46 AM Charlton Amor, PA-C [place]  View Details Report   Routing History...     Date/Time From To Method   04/08/2014 11:20 AM Ranelle Oyster, MD Ranelle Oyster, MD In Basket   04/08/2014 11:20 AM Ranelle Oyster, MD [name], MD In [facility]

## 2014-04-21 NOTE — Progress Notes (Signed)
NUTRITION FOLLOW UP  Intervention:  . Increase Nepro Shake to TID, each supplement provides 425 kcal and 19 grams protein - thicken to appropriate consistency. RD to continue to follow nutrition care plan.  Nutrition Dx:   Inadequate oral intake related to dysphagia as evidenced by 50% meal completion, improving.  Goal:   Patient will meet >/=90% of estimated nutrition needs, unmet   Monitor:   TF tolerance, weight trends, labs, I/Os  Assessment:   Pt admitted with convulsions at home. Pt found to have small focal hemorrhage at junction of left parietal and temporal lobes. Pt was intubated due to agitation. Pt has ESRD on HD at home; family reports he is not compliant.  5/27: CRRT discontinued 5/18. Continues on Dysphagia 3 diet with nectar-thickened liquids. Pt is eating 0-100% of meals. This morning, pt consumed 100% of his breakfast. Pt continues to lose weight.  6/3: Had right IJ line placed yesterday for HD which he received yesterday. Per RN, pt has been eating 60-70% of meals and drinking 100% of Nepro shake. Still having problems communicating per RN. Plan is to transfer to CIR today. Pt continues to lose weight.   Height: Ht Readings from Last 1 Encounters:  03/30/14 5\' 10"  (1.778 m)    Weight Status:   Wt Readings from Last 1 Encounters:  04/20/14 140 lb 6.9 oz (63.7 kg)  Admit wt         178 lb 9.2 oz (81 kg)  EDW per renal: 79 kg -- per renal note, lowest weight now is 67 kg and can probably be lowered even further  Re-estimated needs:  Kcal: 2000 - 2200 Protein: at least 95 gm Fluid: 1.2 liters  Skin: intact  Diet Order: Dysphagia 3 with nectar thick liquids   Intake/Output Summary (Last 24 hours) at 04/21/14 1334 Last data filed at 04/21/14 0900  Gross per 24 hour  Intake    180 ml  Output   4360 ml  Net  -4180 ml    Last BM: 6/1   Labs:   Recent Labs Lab 04/17/14 0930 04/18/14 0505 04/19/14 1056  NA 143 142 140  K 6.0* 4.6 5.2  CL 106  103 103  CO2 21 20 17*  BUN 60* 72* 84*  CREATININE 6.99* 7.24* 7.37*  CALCIUM 8.5 8.3* 8.8  PHOS 6.7* 7.2*  --   GLUCOSE 83 82 96    CBG (last 3)  No results found for this basename: GLUCAP,  in the last 72 hours  Scheduled Meds: . amLODipine  10 mg Oral Q2200  . carvedilol  25 mg Oral BID WC  . cloNIDine  0.3 mg Oral TID  . darbepoetin (ARANESP) injection - DIALYSIS  100 mcg Intravenous Q Tue-HD  . doxercalciferol  2 mcg Intravenous Q T,Th,Sa-HD  . feeding supplement (ENSURE)  1 Container Oral BID  . feeding supplement (NEPRO CARB STEADY)  237 mL Oral Daily  . furosemide  80 mg Oral BID  . hydrALAZINE  100 mg Oral 3 times per day  . isosorbide mononitrate  30 mg Oral Daily  . levETIRAcetam  500 mg Oral BID  . lisinopril  20 mg Oral QHS  . minoxidil  2.5 mg Oral BID  . multivitamin  1 tablet Oral QHS  . phenytoin  200 mg Oral BID  . QUEtiapine  25 mg Oral BID  . sevelamer carbonate  800 mg Oral TID WC    Marshall Cork MS, RD, [place] [phone] Pager [phone] Weekend/After  Hours Pager

## 2014-04-21 NOTE — Progress Notes (Signed)
Rehab admissions - Noted that pt received R IJ dialysis catheter and HD completed yesterday. Spoke with Dr. [place] Pink about pt's status and MD states that pt is medically cleared for CIR. Bed is available and will admit pt today to inpatient rehab. I met with pt to share the news and he was minimally interactive.   I called and spoke with pt's brother to share update on plan to bring pt to CIR today. Voicemails left with pt's fiance and sister-in-law as well. I also updated Anne Ng and Surveyor, minerals. RN aware of plan.  Please call me with any questions. Thanks.  Nanetta Batty, PT Rehabilitation Admissions Coordinator [phone]'

## 2014-04-21 NOTE — H&P (Signed)
Physical Medicine and Rehabilitation Admission H&P  Chief Complaint   Patient presents with   .  Loss of Consciousness   :  Chief Complaint ; weakness  HPI: Matthew Beard is a 38 y.o. right-handed male with history of end-stage renal disease with new start hemodialysis, nonischemic cardiomyopathy, hypertension, diastolic congestive heart failure history of chronic lacunar infarcts as well as medical noncompliance. Admitted 03/30/2014 after questionable syncopal episode with fall versus seizure. She did require intubation in the ED for agitation. He was loaded with Keppra as well as the addition of Dilantin. Cranial CT scan showed focus of parenchymal hemorrhage at the junction of the left temporal and left parietal lobe measuring 1.6 x 1.6 cm. CT angiogram head with no intracranial aneurysm identified no evidence of significant proximal stenosis. EEG consistent with generalized nonspecific cerebral dysfunction. He was extubated and 04/02/2014. Renal service followup hemodialysis as directed with latest creatinine 7.37 and Perm cath placed 04/20/2014 . Followup MRI 03/31/2014 with noted widespread micro-bleeds, chronic corpus callosum hemorrhagic infarct as well as multiple subcentimeter acute infarcts throughout the supratentorial region and repeat CT of the head latest being 04/16/2014 showing no acute changes. Presently maintained on mechanical soft nectar thick liquids. Bouts of restlessness and agitation needing some encouragement to participate with dialysis and maintained on Seroquel. Physical therapy evaluation completed 04/07/2014. . M.D. has requested physical medicine rehabilitation consult. Patient was admitted for comprehensive rehabilitation program  Review of Systems  Respiratory: Positive for shortness of breath.  Cardiovascular: Positive for leg swelling.  Neurological: Positive for dizziness, weakness and headaches.  All other systems reviewed and are negative.   Past Medical History     Diagnosis  Date   .  Hypertension    .  CKD (chronic kidney disease), stage IV    .  Normocytic anemia    .  Pericardial effusion      Remote hx   .  Cerebrovascular disease      Chronic lacunar infarcts   .  NICM (nonischemic cardiomyopathy)    .  CHF (congestive heart failure)    .  Shortness of breath     Past Surgical History   Procedure  Laterality  Date   .  Av fistula placement  Left  10/19/2013     Procedure: ARTERIOVENOUS (AV) FISTULA CREATION- LEFT RADIOCEPHALIC; Surgeon: Larina Earthlyodd F Early, MD; [facility]; Service: Vascular; Laterality: Left;    Family History   Problem  Relation  Age of Onset   .  Hypertension  Mother    .  Stroke  Mother    .  Heart disease  Mother      Heart Disease before age 39   .  Hypertension  Father    .  Stroke  Father    .  Heart disease  Father      Heart Disease before age 39   .  Hypertension  Brother     Social History: reports that he has never smoked. He has never used smokeless tobacco. He reports that he does not drink alcohol or use illicit drugs.  Allergies:  Allergies   Allergen  Reactions   .  Amoxicillin  Hives    Medications Prior to Admission   Medication  Sig  Dispense  Refill   .  amLODipine (NORVASC) 10 MG tablet  Take 1 tablet (10 mg total) by mouth daily.  30 tablet  0   .  carvedilol (COREG) 25 MG tablet  Take 1 tablet (  25 mg total) by mouth 2 (two) times daily with a meal.  60 tablet  0   .  cloNIDine (CATAPRES) 0.2 MG tablet  Take 1 tablet (0.2 mg total) by mouth 3 (three) times daily.  90 tablet  0   .  furosemide (LASIX) 80 MG tablet  Take 80 mg by mouth 2 (two) times daily.     .  hydrALAZINE (APRESOLINE) 100 MG tablet  Take 1 tablet (100 mg total) by mouth 3 (three) times daily.  90 tablet  0   .  isosorbide mononitrate (IMDUR) 30 MG 24 hr tablet  Take 1 tablet (30 mg total) by mouth daily.  30 tablet  0   .  metolazone (ZAROXOLYN) 5 MG tablet  Take 5 mg by mouth daily.     .  potassium chloride (K-DUR) 10  MEQ tablet  Take 10 mEq by mouth daily.     .  sevelamer carbonate (RENVELA) 800 MG tablet  Take 2 tablets (1,600 mg total) by mouth 3 (three) times daily with meals.  90 tablet  0    Home:  Home Living  Family/patient expects to be discharged to:: Private residence  Living Arrangements: Spouse/significant other  Available Help at Discharge: Family;Available 24 hours/day  Type of Home: House  Home Access: Ramped entrance  Home Layout: One level  Home Equipment: Walker - 2 wheels;Wheelchair - manual  Additional Comments: Will need to verify this information  Functional History:  Prior Function  Level of Independence: Needs assistance  Gait / Transfers Assistance Needed: Not sure how much pt was walking; Pt reports he was independent with transfers to wheelchair PTA  Functional Status:  Mobility:  Bed Mobility  Overal bed mobility: Needs Assistance  Bed Mobility: Supine to Sit  Supine to sit: Mod assist  General bed mobility comments: able to initiate moving LEs off of the bed; Mod physical assist to elevate trunk from bed  Transfers  Overall transfer level: Needs assistance  Equipment used: 1 person hand held assist (and support give at gait belt)  Transfers: Stand Pivot Transfers  Stand pivot transfers: Mod assist;Max assist  General transfer comment: Heavy mod assist to power up and max assist to translate center of mass over feet as pt had a heavy posterior lean initially    ADL:   Cognition:  Cognition  Overall Cognitive Status: No family/caregiver present to determine baseline cognitive functioning  Arousal/Alertness: Lethargic  Orientation Level: Oriented to person;Oriented to place;Oriented to situation  Attention: Focused  Focused Attention: Impaired  Focused Attention Impairment: Verbal basic;Functional basic  Memory: Impaired  Memory Impairment: Storage deficit;Decreased recall of new information  Awareness: Impaired  Awareness Impairment: Intellectual impairment   Problem Solving: Impaired  Problem Solving Impairment: Verbal basic;Functional basic  Safety/Judgment: Impaired  Cognition  Arousal/Alertness: Awake/alert  Behavior During Therapy: WFL for tasks assessed/performed  Overall Cognitive Status: No family/caregiver present to determine baseline cognitive functioning  Physical Exam:  Blood pressure 160/99, pulse 74, temperature 98.2 F (36.8 C), temperature source Oral, resp. rate 18, height 5\' 10"  (1.778 m), weight 77.1 kg (169 lb 15.6 oz), SpO2 99.00%.   Constitutional: He appears well-developed.  HENT: oral mucosa pink Head: Normocephalic.  Eyes:  Pupils round reactive to light  Neck: Normal range of motion. Neck supple. No thyromegaly present.  Cardiovascular: Normal rate and regular rhythm.  Respiratory: Effort normal and breath sounds normal. No respiratory distress.  GI: Soft. Bowel sounds are normal. He exhibits no distension.  Neurological:  Patient is awake but withdrawn. He was appropriate for simple yes no questions but would not initiate conversation.limited insight and awareness.  Very inconsistent in follow commands, delayed processing. Moves all 4's with significant delay today. Difficult to say if he favors one side over another. Senses pain in all 4's.   Skin: Skin is warm and dry.  Psychiatric:  Flat, poor initiation    Results for orders placed during the hospital encounter of 03/29/14 (from the past 48 hour(s))   GLUCOSE, CAPILLARY Status: None    Collection Time    04/07/14 11:28 AM   Result  Value  Ref Range    Glucose-Capillary  92  70 - 99 mg/dL   CBC Status: Abnormal    Collection Time    04/08/14 12:09 PM   Result  Value  Ref Range    WBC  6.4  4.0 - 10.5 K/uL    RBC  2.34 (*)  4.22 - 5.81 MIL/uL    Hemoglobin  7.2 (*)  13.0 - 17.0 g/dL    HCT  16.1 (*)  09.6 - 52.0 %    MCV  95.7  78.0 - 100.0 fL    MCH  30.8  26.0 - 34.0 pg    MCHC  32.1  30.0 - 36.0 g/dL    RDW  04.5 (*)  40.9 - 15.5 %     Platelets  125 (*)  150 - 400 K/uL    No results found.  Medical Problem List and Plan:  1. Functional deficits secondary to ICH  2. DVT Prophylaxis/Anticoagulation: SCDs. Monitor for any signs of DVT  3. Pain Management: Tylenol as needed  4. Seizure disorder. Keppra 500 mg every 12 hours, Dilantin 200 mg twice a day. Monitor for any signs of seizure  5. Neuropsych: This patient is not capable of making decisions on his own behalf.  6. End-stage renal disease with hemodialysis. Continue dialysis as per renal services. Followup labs. PermCath placed a 04/20/2014. Therapies to scheduled prior to HD on HD days.  7. Dysphagia. Dysphagia 3 nectar thick liquids. Monitor for any signs of aspiration. Followup speech therapy  8. Hypertension. Norvasc 10 mg daily, Coreg 25 mg twice a day, clonidine 0.3 mg 3 times a day, hydralazine 100 mg every 8 hours, Imdur 30 mg daily and lisinopril 20 mg QHS,Minoxidil 2.5 mg BID. Monitor with increased mobility  9. Diastolic congestive heart failure/NICM. Monitor for any signs of fluid overload. Continue Lasix 80 mg twice a day  10. Chronic anemia. Aranesp. Followup CBC with dialysis  11. Mood. Seroquel 25 mg twice a day. Check sleep chart  12. Hiccups. Thorazine as needed. Limit doses due to neurosedation   Post Admission Physician Evaluation:  1. Functional deficits secondary to ICH. 2. Patient is admitted to receive collaborative, interdisciplinary care between the physiatrist, rehab nursing staff, and therapy team. 3. Patient's level of medical complexity and substantial therapy needs in context of that medical necessity cannot be provided at a lesser intensity of care such as a SNF. 4. Patient has experienced substantial functional loss from his/her baseline which was documented above under the "Functional History" and "Functional Status" headings. Judging by the patient's diagnosis, physical exam, and functional history, the patient has potential for  functional progress which will result in measurable gains while on inpatient rehab. These gains will be of substantial and practical use upon discharge in facilitating mobility and self-care at the household level. 5. Physiatrist will provide 24 hour management of medical  needs as well as oversight of the therapy plan/treatment and provide guidance as appropriate regarding the interaction of the two. 6. 24 hour rehab nursing will assist with bladder management, bowel management, safety, skin/wound care, disease management, medication administration, pain management and patient education and help integrate therapy concepts, techniques,education, etc. 7. PT will assess and treat for/with: Lower extremity strength, range of motion, stamina, balance, functional mobility, safety, adaptive techniques and equipment, NMR, cognitive-behavioral rx, caregiver education, sleep-wake restoration. Therapies to be arranged prior to HD to maximize participation. Goals are: supervision. 8. OT will assess and treat for/with: ADL's, functional mobility, safety, upper extremity strength, adaptive techniques and equipment, NMR, cognitive-behavioral fx, caregiver education, sleep-wake restoration. Therapies prior to HD. Goals are: supervision to min assist. 9. SLP will assess and treat for/with: cognition, language, communication, swallowing. Goals are: min assist. 10. Case Management and Social Worker will assess and treat for psychological issues and discharge planning. 11. Team conference will be held weekly to assess progress toward goals and to determine barriers to discharge. 12. Patient will receive at least 3 hours of therapy per day at least 5 days per week. 13. ELOS: 15-20 days  14. Prognosis: excellent  Ranelle Oyster, MD, [facility] Physical Medicine & Rehabilitation   04/08/2014

## 2014-04-21 NOTE — Progress Notes (Signed)
PMR Admission Coordinator Pre-Admission Assessment  Patient: Matthew Beard is an 38 y.o., male  MRN: [id number]  DOB: 06-18-1976  Height: 5\' 10"  (177.8 cm)  Weight: 63.7 kg (140 lb 6.9 oz)  Insurance Information  HMO: PPO: PCP: IPA: 80/20: OTHER:  PRIMARY: Medicaid [place] Access Policy#: [id number] k Subscriber: self  CM Name: Phone#: Fax#:  Pre-Cert#: verified eligibility on 04-08-14 Employer: unemployed  Benefits: Phone #: [phone] Name:  Eff. Date: 04-08-14 eligible Deduct: none Out of Pocket Max: none Life Max: none  CIR: covered SNF: covered  Outpatient: covered Co-Pay: none  Home Health: covered Co-Pay: none  DME: covered Co-Pay: none  Providers: in network   Emergency Contact Information    Contact Information     Name  Relation  Home  Work  Mobile     [place]    [phone]     Paone,Anthony  Brother    [phone]     Arnoldo Morale  Significant other    [phone]     Lucci,Carla  Relative    [phone]        Current Medical History  Patient Admitting Diagnosis: parenchymal hemorrhage at the junction of the left temporal and left parietal lobe  History of Present Illness: Matthew Beard is a 38 y.o. right-handed male with history of end-stage renal disease with new start hemodialysis, nonischemic cardiomyopathy, hypertension, diastolic congestive heart failure history of chronic lacunar infarcts as well as medical noncompliance. Admitted 03/30/2014 after questionable syncopal episode with fall versus seizure.He did require intubation in the ED for agitation. He was loaded with Keppra as well as the addition of Dilantin. Cranial CT scan showed focus of parenchymal hemorrhage at the junction of the left temporal and left parietal lobe measuring 1.6 x 1.6 cm. CT angiogram head with no intracranial aneurysm identified no evidence of significant proximal stenosis. EEG consistent with generalized nonspecific cerebral dysfunction. He was extubated and  04/02/2014. Renal service followup hemodialysis as directed with latest creatinine . Followup MRI 03/31/2014 with noted widespread micro-bleeds, chronic corpus callosum hemorrhagic infarct as well as multiple subcentimeter acute infarcts throughout the supratentorial region. Presently maintained on mechanical soft nectar thick liquids. M.D. has requested physical medicine rehabilitation consult.  Past Medical History    Past Medical History    Diagnosis  Date    .  Hypertension     .  CKD (chronic kidney disease), stage IV     .  Normocytic anemia     .  Pericardial effusion       Remote hx    .  Cerebrovascular disease       Chronic lacunar infarcts    .  NICM (nonischemic cardiomyopathy)     .  CHF (congestive heart failure)     .  Shortness of breath      Family History  family history includes Heart disease in his father and mother; Hypertension in his brother, father, and mother; Stroke in his father and mother.  Prior Rehab/Hospitalizations: none. Note pt has had medical noncompliance with dialysis.  Current Medications  Current facility-administered medications:0.9 % sodium chloride infusion, 250 mL, Intravenous, PRN, Cyril Mourning V, MD; 0.9 % sodium chloride infusion, 100 mL, Intravenous, PRN, [name], PA-C; 0.9 % sodium chloride infusion, 100 mL, Intravenous, PRN, [name], PA-C; amLODipine (NORVASC) tablet 10 mg, 10 mg, Oral, Q2200, [name], PA-C, 10 mg at 04/20/14 2305  carvedilol (COREG) tablet 25 mg, 25 mg, Oral, BID WC, [name], PA-C,  25 mg at 04/20/14 2040; chlorproMAZINE (THORAZINE) tablet 10 mg, 10 mg, Oral, TID PRN, Hollice EspySendil K Krishnan, MD, 10 mg at 04/07/14 1048; cloNIDine (CATAPRES) tablet 0.3 mg, 0.3 mg, Oral, TID, Sheffield SliderMartha B. Bergman, PA-C, 0.3 mg at 04/20/14 2041; darbepoetin (ARANESP) injection 100 mcg, 100 mcg, Intravenous, Q Tue-HD, Sheffield SliderMartha B. Bergman, PA-C, 100 mcg at 04/20/14 1652  diphenhydrAMINE (BENADRYL) capsule 25 mg, 25 mg, Oral,  Q6H PRN, Storm FriskPatrick E Wright, MD, 25 mg at 04/06/14 0251; doxercalciferol (HECTOROL) injection 2 mcg, 2 mcg, Intravenous, Q T,Th,Sa-HD, Sheffield SliderMartha B. Bergman, PA-C, 2 mcg at 04/20/14 1653; feeding supplement (ENSURE) (ENSURE) pudding 1 Container, 1 Container, Oral, BID, Hettie HolsteinKimberly Alverson Harris, RD, 1 Container at 04/19/14 0940  feeding supplement (NEPRO CARB STEADY) liquid 237 mL, 237 mL, Oral, Daily, Haynes BastSamantha J Worley, RD, 237 mL at 04/20/14 1040; feeding supplement (NEPRO CARB STEADY) liquid 237 mL, 237 mL, Oral, PRN, Sheffield SliderMartha B. Bergman, PA-C; fentaNYL (SUBLIMAZE) injection 25 mcg, 25 mcg, Intravenous, Q2H PRN, Merwyn Katosavid B Simonds, MD, 25 mcg at 04/06/14 1936; food thickener (THICK IT) powder, , Oral, PRN, Hollice EspySendil K Krishnan, MD  furosemide (LASIX) tablet 80 mg, 80 mg, Oral, BID, Sheffield SliderMartha B. Bergman, PA-C, 80 mg at 04/20/14 2042; heparin injection 1,000 Units, 1,000 Units, Dialysis, PRN, Sheffield SliderMartha B. Bergman, PA-C; hydrALAZINE (APRESOLINE) injection 10 mg, 10 mg, Intravenous, Q4H PRN, Nelda Bucksaniel J Feinstein, MD, 10 mg at 04/20/14 0531; hydrALAZINE (APRESOLINE) tablet 100 mg, 100 mg, Oral, 3 times per day, Sheffield SliderMartha B. Bergman, PA-C, 100 mg at 04/21/14 [id number]  isosorbide mononitrate (IMDUR) 24 hr tablet 30 mg, 30 mg, Oral, Daily, Hollice EspySendil K Krishnan, MD, 30 mg at 04/20/14 2041; labetalol (NORMODYNE,TRANDATE) injection 20 mg, 20 mg, Intravenous, Q2H PRN, Storm FriskPatrick E Wright, MD, 20 mg at 04/07/14 [id number]; levETIRAcetam (KEPPRA) 100 MG/ML solution 500 mg, 500 mg, Oral, BID, Elease EtienneAnand D Hongalgi, MD, 500 mg at 04/20/14 2306  lidocaine (PF) (XYLOCAINE) 1 % injection 5 mL, 5 mL, Intradermal, PRN, Sheffield SliderMartha B. Bergman, PA-C; lidocaine-prilocaine (EMLA) cream 1 application, 1 application, Topical, PRN, Sheffield SliderMartha B. Bergman, PA-C; lidocaine-prilocaine (EMLA) cream, , Topical, Q dialysis, Irena CordsJoseph A Coladonato, MD; lisinopril (PRINIVIL,ZESTRIL) tablet 20 mg, 20 mg, Oral, QHS, Sheffield SliderMartha B. Bergman, PA-C  minoxidil (LONITEN) tablet 2.5 mg, 2.5 mg, Oral, BID, Maree Krabbeobert D  Schertz, MD, 2.5 mg at 04/20/14 2307; multivitamin (RENA-VIT) tablet 1 tablet, 1 tablet, Oral, QHS, Derrill KayBridget Mary Whelan, NP, 1 tablet at 04/20/14 2306; pentafluoroprop-tetrafluoroeth (GEBAUERS) aerosol 1 application, 1 application, Topical, PRN, Sheffield SliderMartha B. Bergman, PA-C; phenytoin (DILANTIN) ER capsule 200 mg, 200 mg, Oral, BID, Mickey FarberJonathan A Binz, RPH, 200 mg at 04/20/14 2041  QUEtiapine (SEROQUEL) tablet 25 mg, 25 mg, Oral, BID, Diannia Rudereborah Ross, MD, 25 mg at 04/20/14 2306; sevelamer carbonate (RENVELA) tablet 800 mg, 800 mg, Oral, TID WC, Sheffield SliderMartha B. Bergman, PA-C, 800 mg at 04/19/14 1802  Patients Current Diet: Dysphagia level 3, nectar thick  Precautions / Restrictions  Precautions  Precautions: Fall  Precaution Comments: monitor BP-running high  Restrictions  Weight Bearing Restrictions: No  Other Position/Activity Restrictions: Full Supervision for POs  Note: typical HD days: M-W-F (however, pt last received dialysis on Tues 6-2 and is scheduled next for Thursday 6-4). Pt received new R IJ catheter for dialysis as pt had compliance issues with previous fistula site.  Prior Activity Level  Community (5-7x/wk): pt was independent prior to admit.  Home Assistive Devices / Equipment  Home Assistive Devices/Equipment: None  Home Equipment: Walker - 2 wheels;Wheelchair - manual  Prior Functional Level  Prior  Function  Level of Independence: Needs assistance  Gait / Transfers Assistance Needed: Not sure how much pt was walking; Pt reports he was independent with transfers to wheelchair PTA  Current Functional Level    Cognition  Arousal/Alertness: Lethargic  Overall Cognitive Status: Difficult to assess  Current Attention Level: Sustained  Orientation Level: Oriented to person;Disoriented to place;Disoriented to time;Disoriented to situation  Following Commands: Follows one step commands with increased time  Safety/Judgement: Decreased awareness of safety;Decreased awareness of deficits  General  Comments: Able to follow commands and answer simple questions when given adequate time  Attention: Focused  Focused Attention: Impaired  Focused Attention Impairment: Verbal basic;Functional basic  Memory: Impaired  Memory Impairment: Storage deficit;Decreased recall of new information  Awareness: Impaired  Awareness Impairment: Intellectual impairment  Problem Solving: Impaired  Problem Solving Impairment: Verbal basic;Functional basic  Safety/Judgment: Impaired    Extremity Assessment  (includes Sensation/Coordination)      ADLs  Overall ADL's : Needs assistance/impaired  Eating/Feeding: Minimal assistance;Cueing for safety;Sitting;Cueing for compensatory techinques (assist for bite size)  Grooming: Wash/dry face;Sitting;Moderate assistance;Standing  Upper Body Bathing: Sitting;Cueing for sequencing;Minimal assitance  Lower Body Bathing: Moderate assistance;Sit to/from stand;Cueing for safety;Cueing for sequencing  Upper Body Dressing : Minimal assistance;Sitting  Lower Body Dressing: Maximal assistance;Sitting/lateral leans  Toilet Transfer: Minimal assistance;Regular Toilet;Grab bars  General ADL Comments: pt non verbal most of session, required mutlimodal cues with increased time to initiate and complete tasks    Mobility  Overal bed mobility: Needs Assistance  Bed Mobility: Supine to Sit  Supine to sit: Mod assist  Sit to supine: Total assist  General bed mobility comments: pt up in recliner upon entering room    Transfers  Overall transfer level: Needs assistance  Equipment used: Rolling walker (2 wheeled)  Transfers: Sit to/from Stand  Sit to Stand: Min assist  Stand pivot transfers: Mod assist;Max assist  General transfer comment: Able to power up; cues for safety, and needed handheld/hand over hand cues to reach back for armrests to control descent    Ambulation / Gait / Stairs / Wheelchair Mobility  Ambulation/Gait  Ambulation/Gait assistance: Min assist;Mod assist   Ambulation Distance (Feet): 80 Feet  Assistive device: Rolling walker (2 wheeled)  Gait Pattern/deviations: Shuffle;Decreased stride length;Decreased step length - right;Decreased step length - left;Decreased stance time - right;Decreased stance time - left  Gait velocity: fluctuating  Gait velocity interpretation: <1.8 ft/sec, indicative of risk for recurrent falls  General Gait Details: Short steps; Requiring mod assist at times for RW maneuvering, especially with turning; stopped to adjust socks/scratch foot, and able to do so with one hand on rW for steadiness, however did lose balance and required max assist to prevent fall    Posture / Balance  Dynamic Sitting Balance  Sitting balance - Comments: min guard assist at EOB for grooming.    Special needs/care consideration  BiPAP/CPAP no  CPM no  Continuous Drip IV no  Dialysis - yes Days - typical days were M-W-F (see above comments: pt last received dialysis on Tues 6-2 and is now scheduled for Thur 6-4. HD has been contacted to arrange for later afternoon HD times to accommodate his therapy schedule).  Life Vest no  Oxygen no  Special Bed no  Trach Size no  Wound Vac (area) no  Skin - dry skin, especially on scalp per sister-in-law, L UE fistula site and new R IJ cath site  Bowel mgmt: last BM on 04-19-14 (incontinent)  Bladder mgmt: currently with foley  cath  Diabetic mgmt no    Previous Home Environment  Living Arrangements: Spouse/significant other  Available Help at Discharge: Family;Available 24 hours/day  Type of Home: House  Home Layout: One level  Home Access: Ramped entrance  Additional Comments: pt's brother Ethelene Browns and sister in law Albin Felling will be able to provide 24-supervision in their home. Pt's fiancee is also very supportive and has a variable work schedule with his work as a Therapist, nutritional. They will be working as a team to coordinate his care needs. Sister-in-law works in the school system and will be home all summer,  available for 24 hr care.  Discharge Living Setting  Plans for Discharge Living Setting: Apartment (plans to go to brother's apartment at DC and sister-in-law to be primary caregiver while fiance works)  Type of Home at Discharge: Apartment  Discharge Home Layout: One level  Discharge Home Access: Level entry  Does the patient have any problems obtaining your medications?: No  Social/Family/Support Systems  Patient Roles: Partner  Contact Information: brother Ethelene Browns and sister-in-law Albin Felling will be primary contact  Anticipated Caregiver: sister in law, Albin Felling and fiance Judeth Cornfield (around her work schedule)  Anticipated Industrial/product designer Information: see above  Ability/Limitations of Caregiver: no limitations  Caregiver Availability: 24/7  Discharge Plan Discussed with Primary Caregiver: Yes (discussed with pt's brother and sister-in-law on 5-25, w/ fiance on 5-22. Sister- in-law to update pt's fiance after discussion on 5-25. Had continued discussions with finace and brother on 04-19-14. Have updated pt's family on several other occasions. See previous epic notes.)  Is Caregiver In Agreement with Plan?: Yes  Does Caregiver/Family have Issues with Lodging/Transportation while Pt is in Rehab?: No  Goals/Additional Needs  Patient/Family Goal for Rehab: supervision and minimal assist with PT, OT and SLP  Expected length of stay: 18-24 days  Cultural Considerations: none. pt is [employer]  Dietary Needs: Dys 3, nectar thick, dialysis pt  Equipment Needs: to be determined  Special Service Needs: Dialysis M-W-F (was his typical schedule) but now to receive dialysis on Thursday 6-4. See above comments. HD has been contacted to arrange for later afternoon dialysis times to accommodate his therapy sessions.  Pt/Family Agrees to Admission and willing to participate: Yes (spoke with pt's brother and sister in law on 5-25 and pt's fiance on several days)  Program Orientation Provided & Reviewed with  Pt/Caregiver Including Roles & Responsibilities: Yes  Decrease burden of Care through IP rehab admission: NA  Possible need for SNF placement upon discharge: not anticipated  Patient Condition: This patient's medical and functional status has changed since the consult dated: 04-08-14 in which the Rehabilitation Physician determined and documented that the patient's condition is appropriate for intensive rehabilitative care in an inpatient rehabilitation facility. See "History of Present Illness" (above) for medical update. Functional changes are: minimal to moderate assist to ambulate 80' and variable assist with self care skills from minimal to max assist. Patient's medical and functional status update has been discussed with the Rehabilitation physician and patient remains appropriate for inpatient rehabilitation. Will admit to inpatient rehab today.  Preadmission Screen Completed By: Thane Edu, PT 04/21/2014 10:27 AM  ______________________________________________________________________  Discussed status with Dr. Riley Kill on 04-21-14 at 1037 and received telephone approval for admission today.  Admission Coordinator: Thane Edu, PT time 1037/Date 04-21-14    Cosigned by: Ranelle Oyster, MD [04/21/2014 11:18 AM]

## 2014-04-21 NOTE — Progress Notes (Signed)
Patient ID: Matthew Beard, male   DOB: [birth year], 38 y.o.   MRN: [id number] Pt was transferred from [address] to [address].mission vital sign are stable

## 2014-04-22 ENCOUNTER — Inpatient Hospital Stay (HOSPITAL_COMMUNITY): Payer: Self-pay | Admitting: Speech Pathology

## 2014-04-22 ENCOUNTER — Encounter (HOSPITAL_COMMUNITY): Payer: Self-pay | Admitting: Occupational Therapy

## 2014-04-22 ENCOUNTER — Inpatient Hospital Stay (HOSPITAL_COMMUNITY): Payer: Self-pay | Admitting: Occupational Therapy

## 2014-04-22 ENCOUNTER — Inpatient Hospital Stay (HOSPITAL_COMMUNITY): Payer: Self-pay | Admitting: Physical Therapy

## 2014-04-22 DIAGNOSIS — M3589 Other specified systemic involvement of connective tissue: Secondary | ICD-10-CM

## 2014-04-22 DIAGNOSIS — N039 Chronic nephritic syndrome with unspecified morphologic changes: Secondary | ICD-10-CM

## 2014-04-22 DIAGNOSIS — I10 Essential (primary) hypertension: Secondary | ICD-10-CM

## 2014-04-22 DIAGNOSIS — I619 Nontraumatic intracerebral hemorrhage, unspecified: Secondary | ICD-10-CM

## 2014-04-22 DIAGNOSIS — M358 Other specified systemic involvement of connective tissue: Secondary | ICD-10-CM

## 2014-04-22 DIAGNOSIS — D631 Anemia in chronic kidney disease: Secondary | ICD-10-CM

## 2014-04-22 LAB — BASIC METABOLIC PANEL
BUN: 74 mg/dL — ABNORMAL HIGH (ref 6–23)
CALCIUM: 9 mg/dL (ref 8.4–10.5)
CO2: 22 mEq/L (ref 19–32)
CREATININE: 7.85 mg/dL — AB (ref 0.50–1.35)
Chloride: 91 mEq/L — ABNORMAL LOW (ref 96–112)
GFR calc Af Amer: 9 mL/min — ABNORMAL LOW (ref >90)
GFR, EST NON AFRICAN AMERICAN: 8 mL/min — AB (ref >90)
GLUCOSE: 95 mg/dL (ref 70–99)
Potassium: 6.1 mEq/L — ABNORMAL HIGH (ref 3.7–5.3)
SODIUM: 133 meq/L — AB (ref 137–147)

## 2014-04-22 LAB — GLUCOSE, CAPILLARY: GLUCOSE-CAPILLARY: 110 mg/dL — AB (ref 70–99)

## 2014-04-22 LAB — PHENYTOIN LEVEL, TOTAL: PHENYTOIN LVL: 9.5 ug/mL — AB (ref 10.0–20.0)

## 2014-04-22 MED ORDER — NEPRO/CARBSTEADY PO LIQD
237.0000 mL | Freq: Two times a day (BID) | ORAL | Status: DC
  Administered 2014-04-22 – 2014-04-23 (×2): 237 mL via ORAL

## 2014-04-22 MED ORDER — SODIUM POLYSTYRENE SULFONATE 15 GM/60ML PO SUSP
30.0000 g | Freq: Once | ORAL | Status: AC
  Administered 2014-04-22: 30 g via ORAL
  Filled 2014-04-22: qty 120

## 2014-04-22 MED ORDER — CHLORPROMAZINE HCL 10 MG PO TABS
10.0000 mg | ORAL_TABLET | Freq: Three times a day (TID) | ORAL | Status: DC | PRN
  Administered 2014-04-23: 10 mg via ORAL
  Filled 2014-04-22 (×2): qty 1

## 2014-04-22 MED ORDER — QUETIAPINE 12.5 MG HALF TABLET
12.5000 mg | ORAL_TABLET | Freq: Two times a day (BID) | ORAL | Status: DC
  Filled 2014-04-22 (×3): qty 1

## 2014-04-22 MED ORDER — CLONIDINE HCL 0.1 MG PO TABS
0.1000 mg | ORAL_TABLET | Freq: Three times a day (TID) | ORAL | Status: DC
  Administered 2014-04-22 – 2014-04-24 (×2): 0.1 mg via ORAL
  Filled 2014-04-22 (×9): qty 1

## 2014-04-22 MED ORDER — ALTEPLASE 100 MG IV SOLR
4.0000 mg | Freq: Once | INTRAVENOUS | Status: AC
Start: 2014-04-22 — End: 2014-04-22
  Administered 2014-04-22: 4.1 mg
  Filled 2014-04-22: qty 4

## 2014-04-22 NOTE — Progress Notes (Signed)
Pt ran 1hr of a 4hr hemodialysis Tx. Clotted lines off, lines were changed and pt immediately clotted 2 more systems. Cath had not pull arterial and sluggish push. Pt K level that was drawn pre HD was 6.1. Dr. Briant Cedar called and informed about pt not being able to get HD Tx due to continually clotting lines off. Tx not restarted. Catheter was ordered to be activased and Kyexellate was ordered for pt to get for high K level. Renal panel to be repeated in the am.

## 2014-04-22 NOTE — Progress Notes (Signed)
At the beginning of shift, I went into the pts room to introduce myself and ask if he needed anything at the moment. The pt acknowledged me and said no he was fine. He answered a few other yes/ no questions for me and then I told him I would be back in a little while. Later at around 2225I went into the pts room to administer his medications. Upon arrival, the nurse tech was in there cleaning him from a bm and stated that the pt had just become unresponsive and his teeth were clamped down on the oral swab. I tried to arouse the pt and ask him to let go of the mouth swab, but the pt did not respond or let go of the oral swab. The pt was very stiff and had some sporadic eye blinking (eyes were closed). I then recognized that he was having a seizure and went to grab my charge nurse. I contacted rapid response and communicated the need for a consult. At 2230 I contacted Harvel Ricks, PA and communicated that the pt had just had a seizure and that his vitals were stable and the pt was somewhat post ictal, but seemed to be coming back around to his baseline. Vitals were B/P- 139/82 P-97 R-18 T-97.4 97% RA O2 Sats-96. Pt then became responsive to pain (trapezeous squeeze; yelled out), but was still not able to state his first name; PERRLA and 4 to 5mm. Will continue to monitor.  22:51- Pt is more alert now and seems to be back at baseline, just lethargic. Took his medication fine with no swallowing complications. Will continue to monitor

## 2014-04-22 NOTE — Progress Notes (Signed)
Patient information reviewed and entered into [facility] system by Meng Winterton, RN, CRRN, PPS Coordinator.  Information including medical coding and functional independence measure will be reviewed and updated through discharge.    

## 2014-04-22 NOTE — Evaluation (Signed)
Physical Therapy Assessment and Plan  Patient Details  Name: Matthew Beard MRN: [id number] Date of Birth: [birth year]  PT Diagnosis: Abnormality of gait, Hemiplegia non-dominant, Impaired cognition, Low back pain and Muscle weakness Rehab Potential: Good (Based on pt age and performance with therapies on acute care) ELOS: 21 days   Today's Date: 04/22/2014 Time: 0109-3235 Time Calculation (min): 26 min  Problem List:  Patient Active Problem List   Diagnosis Date Noted  . Convulsions/seizures 04/07/2014  . Encephalopathy 04/07/2014  . Aspiration pneumonia 04/07/2014  . Dysphagia, unspecified(787.20) 04/07/2014  . Noncompliance with medication regimen in past- mult adm for uncontrolled HTN 03/31/2014  . Intracerebral hemorrhage 03/30/2014  . ICH (intracerebral hemorrhage) 03/30/2014  . ESRD- HD since April 2015 03/08/2014  . Cardiomyopathy EF 25-30%- etiol undetermined- EF varies- 20% Aug 2014, 40% Nov 2014, 50% March 2015 09/11/2013  . Autoimmune disorder 09/11/2013  . History of noncompliance with medical treatment 07/04/2013  . Uncontrolled hypertension 08/10/2011  . Anemia in chronic kidney disease 08/10/2011  . Mixed connective tissue disease 08/10/2011    Past Medical History:  Past Medical History  Diagnosis Date  . Hypertension   . CKD (chronic kidney disease), stage IV   . Normocytic anemia   . Pericardial effusion     Remote hx  . Cerebrovascular disease     Chronic lacunar infarcts  . NICM (nonischemic cardiomyopathy)   . CHF (congestive heart failure)   . Shortness of breath    Past Surgical History:  Past Surgical History  Procedure Laterality Date  . Av fistula placement Left 10/19/2013    Procedure: ARTERIOVENOUS (AV) FISTULA CREATION- LEFT RADIOCEPHALIC;  Surgeon: Rosetta Posner, MD;  Location: [facility] OR;  Service: Vascular;  Laterality: Left;    Assessment & Plan Clinical Impression: [name] is a 38 y.o. right-handed male with history of end-stage  renal disease with new start hemodialysis, nonischemic cardiomyopathy, hypertension, diastolic congestive heart failure history of chronic lacunar infarcts as well as medical noncompliance. Admitted 03/30/2014 after questionable syncopal episode with fall versus seizure. She did require intubation in the ED for agitation. He was loaded with Keppra as well as the addition of Dilantin. Cranial CT scan showed focus of parenchymal hemorrhage at the junction of the left temporal and left parietal lobe measuring 1.6 x 1.6 cm. CT angiogram head with no intracranial aneurysm identified no evidence of significant proximal stenosis. EEG consistent with generalized nonspecific cerebral dysfunction. He was extubated and 04/02/2014. Renal service followup hemodialysis as directed with latest creatinine 7.37 and Perm cath placed 04/20/2014 . Followup MRI 03/31/2014 with noted widespread micro-bleeds, chronic corpus callosum hemorrhagic infarct as well as multiple subcentimeter acute infarcts throughout the supratentorial region and repeat CT of the head latest being 04/16/2014 showing no acute changes. Presently maintained on mechanical soft nectar thick liquids. Bouts of restlessness and agitation needing some encouragement to participate with dialysis and maintained on Seroquel. Patient transferred to CIR on 04/21/2014 .   Patient currently requires total with mobility secondary to muscle weakness, unbalanced muscle activation and decreased coordination, decreased visual acuity, decreased visual perceptual skills and decreased visual motor skills, decreased midline orientation and decreased attention to left, decreased initiation, decreased attention and decreased awareness and decreased standing balance and decreased postural control.  Prior to hospitalization, patient was modified independent  with mobility and lived with Significant other in a House (per chart review) home.  Home access is ramped.  Patient will benefit from  skilled PT intervention to maximize safe  functional mobility and minimize fall risk for planned discharge home with 24 hour supervision.  Anticipate patient will benefit from follow up HH at discharge.  PT - End of Session Endurance Deficit Description: Unable to formally assess endurance secondary to decreased pt arousal PT Assessment Rehab Potential: Good (Based on pt age and performance with therapies on acute care) Barriers to Discharge: Other (comment) (Unable to verify home environment or available support at D/C, as family not present on evaluation) PT Patient demonstrates impairments in the following area(s): Balance;Endurance;Motor;Sensory;Perception;Safety PT Transfers Functional Problem(s): Bed Mobility;Bed to Chair;Car;Furniture PT Locomotion Functional Problem(s): Ambulation;Wheelchair Mobility PT Plan PT Intensity: Minimum of 1-2 x/day ,45 to 90 minutes PT Frequency: 5 out of 7 days PT Duration Estimated Length of Stay: 21 days PT Treatment/Interventions: Ambulation/gait training;Balance/vestibular training;Cognitive remediation/compensation;Disease management/prevention;Discharge planning;Functional mobility training;Patient/family education;Therapeutic Exercise;Therapeutic Activities;UE/LE Coordination activities;Wheelchair propulsion/positioning;UE/LE Strength taining/ROM;Stair training;Neuromuscular re-education;DME/adaptive equipment instruction PT Transfers Anticipated Outcome(s): Supervision PT Locomotion Anticipated Outcome(s): Supervision to Min A PT Recommendation Recommendations for Other Services: Other (comment) (None at this time) Follow Up Recommendations: 24 hour supervision/assistance;Other (comment) (To be determined) Patient destination: Skilled Nursing Facility (SNF) (TBD; Home vs SNF based on available family support, pt progress) Equipment Recommended: To be determined  Skilled Therapeutic Intervention PT evaluation initiated; see below for details.  Treatment focused on increasing pt arousal to facilitate participation in functional mobility. Performed supine<>sit and squat pivot transfers with Total A, minimal pt participation. Unable to elicit eye opening. Minimal response to noxious stimuli.  Alerted RN to suspect change in pt status, as pt presenting much differently than in OT eval this AM. RN reported that decreased arousal is likely attributable to medication given this AM. RN notified PA, who presented to pt room soon thereafter. PA reporting that current presentation is not a significant change in pt status and to expect fluctuations in pt arousal. Session ended at this time secondary to pt inability to participate. Therapist departed with pt semi reclined in bed with 3 bed rails up, bed alarm on, and pt in no apparent distress.  Due to limited PT evaluation findings, long term goals based on OT findings and recent documentation of pt performance in acute care setting.   PT Evaluation Precautions/Restrictions Precautions Precautions: Fall Restrictions Weight Bearing Restrictions: No General Amount of Missed PT Time (min): 34 Minutes Missed Time Reason: Other (comment) (Decreased pt arousal) Vital SignsTherapy Vitals Temp: 98.8 F (37.1 C) Temp src: Oral Pulse Rate: 99 Resp: 19 BP: 137/67 mmHg Patient Position (if appropriate): Lying Oxygen Therapy SpO2: 94 % O2 Device: None (Room air) Pain Pain Assessment Pain Assessment: No/denies pain Pain Score: Asleep Home Living/Prior Functioning Home Living Available Help at Discharge: Family;Available 24 hours/day (per chart review) Type of Home: House (per chart review) Additional Comments: Will need to verify this information; no family present on PT evaluation  Lives With: Significant other (per chart review) Prior Function Vocation: Unemployed (per chart review ) Comments: Unable to verify PLOF Vision/Perception  Vision - Assessment Additional Comments: Blank stare,  flat affect.  No spontaneous scanning noted left or right - patient becoming increasingly lethargic with time Praxis Praxis-Other Comments: Need further assessment  Cognition Overall Cognitive Status: Difficult to assess Arousal/Alertness: Lethargic Orientation Level: Other (comment) (nonverbal on this date, minimially responsive to auditory and tactile stim) Attention: Focused Focused Attention: Impaired Focused Attention Impairment: Verbal basic;Functional basic Memory: Impaired Memory Impairment: Storage deficit Awareness: Impaired Awareness Impairment: Intellectual impairment Problem Solving: Impaired Problem Solving Impairment: Verbal basic Behaviors: Poor frustration tolerance;Verbal   agitation Safety/Judgment: Impaired Comments: Pt was minimally responsive to auditory and tactile stim including sternal rub Sensation Sensation Light Touch: Not tested Stereognosis: Not tested Hot/Cold: Not tested Proprioception: Not tested Additional Comments: Unable to assess secondary to decreased pt arousal Coordination Gross Motor Movements are Fluid and Coordinated: Not tested Fine Motor Movements are Fluid and Coordinated: Not tested Motor  Motor Motor: Motor impersistence;Abnormal postural alignment and control Motor - Skilled Clinical Observations: Question hemiparesis - left  Mobility Bed Mobility Bed Mobility: Sit to Supine;Supine to Sit;Scooting to HOB Supine to Sit: 1: +1 Total assist;HOB elevated Supine to Sit Details (indicate cue type and reason): secondary to decreased pt arousal Sit to Supine: HOB elevated;1: +1 Total assist Sit to Supine - Details (indicate cue type and reason): secondary to decreased pt arousal Scooting to HOB: 1: +2 Total assist Scooting to HOB: Patient Percentage: 0% Scooting to HOB Details (indicate cue type and reason): secondary to decreased pt arousal Transfers Transfers: Yes Squat Pivot Transfers: 1: +1 Total assist Squat Pivot Transfer  Details (indicate cue type and reason): secondary to decreased pt arousal Locomotion  Ambulation Ambulation: No (Unsafe at this time) Gait Gait: No (Unsafe at this time) Stairs / Additional Locomotion Stairs: No (Unsafe at this time) Wheelchair Mobility Wheelchair Mobility: No (Not formally assessed due to decreased pt arousal)  Trunk/Postural Assessment  Postural Control Postural Control: Deficits on evaluation (Total A for static sitting secondary to decreased pt arousal)  Balance Balance Balance Assessed: No (Total A for static sitting secondary to decreased arousal) Extremity Assessment  RUE Assessment RUE Assessment:  (PROM WFL) LUE Assessment LUE Assessment: Within Functional Limits (PROM WFL) RLE Assessment RLE Assessment: Not tested LLE Assessment LLE Assessment: Not tested  FIM:  FIM - Bed/Chair Transfer Bed/Chair Transfer Assistive Devices: HOB elevated Bed/Chair Transfer: 1: Bed > Chair or W/C: Total A (helper does all/Pt. < 25%);1: Sit > Supine: Total A (helper does all/Pt. < 25%);1: Chair or W/C > Bed: Total A (helper does all/Pt. < 25%);1: Supine > Sit: Total A (helper does all/Pt. < 25%) FIM - Locomotion: Wheelchair Locomotion: Wheelchair: 0: Activity did not occur (Not assessed secondary to decreased arousal) FIM - Locomotion: Ambulation Locomotion: Ambulation: 0: Activity did not occur (Not assessed secondary to decreased arousal) FIM - Locomotion: Stairs Locomotion: Stairs: 0: Activity did not occur (Not assessed secondary to decreased arousal)   Refer to Care Plan for Long Term Goals  Recommendations for other services: None  Discharge Criteria: Patient will be discharged from PT if patient refuses treatment 3 consecutive times without medical reason, if treatment goals not met, if there is a change in medical status, if patient makes no progress towards goals or if patient is discharged from hospital.  The above assessment, treatment plan, treatment  alternatives and goals were discussed and mutually agreed upon: No family available/patient unable   A  04/22/2014, 8:37 PM  

## 2014-04-22 NOTE — Progress Notes (Signed)
[facility]YSICAL MEDICINE & REHABILITATION     PROGRESS NOTE    Subjective/Complaints: No problems. Awake and alert this am. Denies pain.  ROS limited by poor cognitive status  Objective: Vital Signs: Blood pressure 138/83, pulse 91, temperature 98.2 F (36.8 C), temperature source Oral, resp. rate 18, height 5\' 6"  (1.676 m), weight 61.9 kg (136 lb 7.4 oz), SpO2 97.00%. Ir Fluoro Guide Cv Line Right  04/20/2014   CLINICAL DATA:  Renal failure and need for tunneled dialysis catheter.  EXAM: TUNNELED CENTRAL VENOUS HEMODIALYSIS CATHETER PLACEMENT WITH ULTRASOUND AND FLUOROSCOPIC GUIDANCE  ANESTHESIA/SEDATION: 2.0 mg IV Versed; 100 mcg IV Fentanyl.  Total Moderate Sedation Time  15 minutes.  MEDICATIONS: 1 g IV vancomycin. Vancomycin was given within two hours of incision. Vancomycin was given due to an antibiotic allergy.  FLUOROSCOPY TIME:  24 seconds.  PROCEDURE: The procedure, risks, benefits, and alternatives were explained to the patient's brother. Questions regarding the procedure were encouraged and answered. The patient's brother understands and consents to the procedure.  The right neck and chest were prepped with chlorhexidine in a sterile fashion, and a sterile drape was applied covering the operative field. Maximum barrier sterile technique with sterile gowns and gloves were used for the procedure. Local anesthesia was provided with 1% lidocaine.  Ultrasound was used to confirm patency of the right internal jugular vein. After creating a small venotomy incision, a 21 gauge needle was advanced into the right internal jugular vein under direct, real-time ultrasound guidance. Ultrasound image documentation was performed. After securing guidewire access, an 8 Fr dilator was placed. A J-wire was kinked to measure appropriate catheter length.  A Bard HemoSplit tunneled hemodialysis catheter measuring 23 cm from tip to cuff was chosen for placement. This was tunneled in a retrograde fashion from  the chest wall to the venotomy incision.  At the venotomy, serial dilatation was performed and a 16 Fr peel-away sheath was placed over a guidewire. The catheter was then placed through the sheath and the sheath removed. Final catheter positioning was confirmed and documented with a fluoroscopic spot image. The catheter was aspirated, flushed with saline, and injected with appropriate volume heparin dwells.  The venotomy incision was closed with subcutaneous 3-0 Monocryl and subcuticular 4-0 Vicryl. Dermabond was applied to the incision. The catheter exit site was secured with 0-Prolene retention sutures.  COMPLICATIONS: None.  No pneumothorax.  FINDINGS: After catheter placement, the tips lie in the right atrium. The catheter aspirates normally and is ready for immediate use.  IMPRESSION: Placement of tunneled hemodialysis catheter via the right internal jugular vein. The catheter tips lie in the right atrium. The catheter is ready for immediate use.   Electronically Signed   By: Irish Lack M.D.   On: 04/20/2014 16:43   Ir US Guide Vasc Access Right  04/20/2014   CLINICAL DATA:  Renal failure and need for tunneled dialysis catheter.  EXAM: TUNNELED CENTRAL VENOUS HEMODIALYSIS CATHETER PLACEMENT WITH ULTRASOUND AND FLUOROSCOPIC GUIDANCE  ANESTHESIA/SEDATION: 2.0 mg IV Versed; 100 mcg IV Fentanyl.  Total Moderate Sedation Time  15 minutes.  MEDICATIONS: 1 g IV vancomycin. Vancomycin was given within two hours of incision. Vancomycin was given due to an antibiotic allergy.  FLUOROSCOPY TIME:  24 seconds.  PROCEDURE: The procedure, risks, benefits, and alternatives were explained to the patient's brother. Questions regarding the procedure were encouraged and answered. The patient's brother understands and consents to the procedure.  The right neck and chest were prepped with chlorhexidine in a  sterile fashion, and a sterile drape was applied covering the operative field. Maximum barrier sterile technique with  sterile gowns and gloves were used for the procedure. Local anesthesia was provided with 1% lidocaine.  Ultrasound was used to confirm patency of the right internal jugular vein. After creating a small venotomy incision, a 21 gauge needle was advanced into the right internal jugular vein under direct, real-time ultrasound guidance. Ultrasound image documentation was performed. After securing guidewire access, an 8 Fr dilator was placed. A J-wire was kinked to measure appropriate catheter length.  A Bard HemoSplit tunneled hemodialysis catheter measuring 23 cm from tip to cuff was chosen for placement. This was tunneled in a retrograde fashion from the chest wall to the venotomy incision.  At the venotomy, serial dilatation was performed and a 16 Fr peel-away sheath was placed over a guidewire. The catheter was then placed through the sheath and the sheath removed. Final catheter positioning was confirmed and documented with a fluoroscopic spot image. The catheter was aspirated, flushed with saline, and injected with appropriate volume heparin dwells.  The venotomy incision was closed with subcutaneous 3-0 Monocryl and subcuticular 4-0 Vicryl. Dermabond was applied to the incision. The catheter exit site was secured with 0-Prolene retention sutures.  COMPLICATIONS: None.  No pneumothorax.  FINDINGS: After catheter placement, the tips lie in the right atrium. The catheter aspirates normally and is ready for immediate use.  IMPRESSION: Placement of tunneled hemodialysis catheter via the right internal jugular vein. The catheter tips lie in the right atrium. The catheter is ready for immediate use.   Electronically Signed   By: Irish LackGlenn  Yamagata M.D.   On: 04/20/2014 16:43   No results found for this basename: WBC, HGB, HCT, PLT,  in the last 72 hours  Recent Labs  04/19/14 1056  NA 140  K 5.2  CL 103  GLUCOSE 96  BUN 84*  CREATININE 7.37*  CALCIUM 8.8   CBG (last 3)   Recent Labs  04/22/14 0022   GLUCAP 110*    Wt Readings from Last 3 Encounters:  04/21/14 61.9 kg (136 lb 7.4 oz)  04/20/14 63.7 kg (140 lb 6.9 oz)  03/15/14 81.012 kg (178 lb 9.6 oz)    Physical Exam:  Constitutional: He appears well-developed. Having hiccops  HENT: oral mucosa pink  Head: Normocephalic.  Eyes:  Pupils round reactive to light  Neck: Normal range of motion. Neck supple. No thyromegaly present.  Cardiovascular: Normal rate and regular rhythm.  Respiratory: Effort normal and breath sounds normal. No respiratory distress.  GI: Soft. Bowel sounds are normal. He exhibits no distension.  Neurological:  Patient is awake but withdrawn. He was appropriate for simple yes no questions but would not initiate conversation.limited insight and awareness. Very inconsistent in follow commands, delayed processing. Moves all 4's with significant delay today. Difficult to say if he favors one side over another. Senses pain in all 4's.  Skin: Skin is warm and dry.  Psychiatric:  Flat, poor initiation   Assessment/Plan: 1. Functional deficits secondary to ICH which require 3+ hours per day of interdisciplinary therapy in a comprehensive inpatient rehab setting. Physiatrist is providing close team supervision and 24 hour management of active medical problems listed below. Physiatrist and rehab team continue to assess barriers to discharge/monitor patient progress toward functional and medical goals. FIM:                   Comprehension Comprehension: 1-Understands basic less than 25% of the time/requires  cueing 75% of the time  Expression Expression: 1-Expresses basis less than 25% of the time/requires cueing greater than 75% of the time.  Social Interaction Social Interaction: 2-Interacts appropriately 25 - 49% of time - Needs frequent redirection.  Problem Solving Problem Solving: 2-Solves basic 25 - 49% of the time - needs direction more than half the time to initiate, plan or complete simple  activities  Memory Memory: 1-Recognizes or recalls less than 25% of the time/requires cueing greater than 75% of the time  Medical Problem List and Plan:  1. Functional deficits secondary to ICH  2. DVT Prophylaxis/Anticoagulation: SCDs. Monitor for any signs of DVT  3. Pain Management: Tylenol as needed  4. Seizure disorder. Keppra 500 mg every 12 hours, Dilantin 200 mg twice a day. Monitor for any signs of seizure  5. Neuropsych: This patient is not capable of making decisions on his own behalf.  6. End-stage renal disease with hemodialysis. Continue dialysis as per renal services.   PermCath placed a 04/20/2014. Therapies to scheduled prior to HD on HD days.  7. Dysphagia. Dysphagia 3 nectar thick liquids.   8. Hypertension. Norvasc 10 mg daily, Coreg 25 mg twice a day, clonidine 0.3 mg 3 times a day, hydralazine 100 mg every 8 hours, Imdur 30 mg daily and lisinopril 20 mg QHS,Minoxidil 2.5 mg BID.   -no changes at present  9. Diastolic congestive heart failure/NICM. Monitor for any signs of fluid overload. Continue Lasix 80 mg twice a day  10. Chronic anemia. Aranesp. Followup CBC with dialysis  11. Mood. Seroquel 25 mg twice a day.   12. Hiccups. Limited thorazine prn  LOS (Days) 1 A FACE TO FACE EVALUATION WAS PERFORMED  Ranelle Oyster 04/22/2014 7:49 AM

## 2014-04-22 NOTE — Progress Notes (Signed)
INITIAL NUTRITION ASSESSMENT  DOCUMENTATION CODES Per approved criteria  -Not Applicable   INTERVENTION: -Increase Nepro Shake to BID, each supplement provides 425 kcal and 19 grams protein - thicken to appropriate consistency.  - Ensure Pudding po BID, each supplement provides 170 kcal and 4 grams of protein -RD to continue to follow nutrition care plan.  NUTRITION DIAGNOSIS: Inadequate oral intake related to CVA as evidenced by wt loss.   Goal: Pt to meet >/= 90% of their estimated nutrition needs   Monitor:  Wt, po intake, acceptance of supplements, SLP notes, labs   Reason for Assessment: MST  38 y.o. male  Admitting Dx: <principal problem not specified>  ASSESSMENT: 38 y.o. right-handed male with history of end-stage renal disease with new start hemodialysis, nonischemic cardiomyopathy, hypertension, diastolic congestive heart failure history of chronic lacunar infarcts as well as medical noncompliance. Admitted 03/30/2014 after questionable syncopal episode with fall versus seizure.  RD spoke with pt's fiance in room. She reports that pt has been eating well at some meals, and she says that his intake is improving. Meal completion is 0-100%. She reports ~10 lb weight loss since admission. Pt continues on a dysphagia III diet with nectar-thickened liquids. Pt has been drinking Nepro Shakes TID.   Height: Ht Readings from Last 1 Encounters:  04/21/14 5\' 6"  (1.676 m)    Weight: Wt Readings from Last 1 Encounters:  04/21/14 136 lb 7.4 oz (61.9 kg)    Ideal Body Weight: 63.8 kg  % Ideal Body Weight: 97%  Wt Readings from Last 10 Encounters:  04/21/14 136 lb 7.4 oz (61.9 kg)  04/20/14 140 lb 6.9 oz (63.7 kg)  03/15/14 178 lb 9.6 oz (81.012 kg)  02/26/14 180 lb 9 oz (81.903 kg)  02/26/14 182 lb (82.555 kg)  02/14/14 171 lb (77.565 kg)  01/04/14 165 lb (74.844 kg)  12/01/13 173 lb (78.472 kg)  11/16/13 165 lb (74.844 kg)  10/06/13 167 lb (75.751 kg)    Usual  Body Weight: 145 lbs, per pt's fiance  % Usual Body Weight: 94%  BMI:  Body mass index is 22.04 kg/(m^2).  Estimated Nutritional Needs: Kcal: 1900-2150 Protein: 80-90 g Fluid: 1.2 L/day  Skin: Intact  Diet Order: Dysphagia  EDUCATION NEEDS: -Education not appropriate at this time   Intake/Output Summary (Last 24 hours) at 04/22/14 1239 Last data filed at 04/22/14 0745  Gross per 24 hour  Intake      0 ml  Output      0 ml  Net      0 ml    Last BM: 6/4   Labs:   Recent Labs Lab 04/17/14 0930 04/18/14 0505 04/19/14 1056  NA 143 142 140  K 6.0* 4.6 5.2  CL 106 103 103  CO2 21 20 17*  BUN 60* 72* 84*  CREATININE 6.99* 7.24* 7.37*  CALCIUM 8.5 8.3* 8.8  PHOS 6.7* 7.2*  --   GLUCOSE 83 82 96    CBG (last 3)   Recent Labs  04/22/14 0022  GLUCAP 110*    Scheduled Meds: . carvedilol  25 mg Oral BID WC  . cloNIDine  0.1 mg Oral TID  . [START ON 04/27/2014] darbepoetin (ARANESP) injection - DIALYSIS  100 mcg Intravenous Q Tue-HD  . doxercalciferol  2 mcg Intravenous Q T,Th,Sa-HD  . feeding supplement (ENSURE)  1 Container Oral BID  . feeding supplement (NEPRO CARB STEADY)  237 mL Oral Daily  . hydrALAZINE  100 mg Oral 3 times per  day  . isosorbide mononitrate  30 mg Oral Daily  . levETIRAcetam  500 mg Oral BID  . lisinopril  20 mg Oral QHS  . minoxidil  2.5 mg Oral BID  . multivitamin  1 tablet Oral QHS  . phenytoin  200 mg Oral BID  . [START ON 04/23/2014] QUEtiapine  12.5 mg Oral BID  . sevelamer carbonate  800 mg Oral TID WC    Continuous Infusions:   Past Medical History  Diagnosis Date  . Hypertension   . CKD (chronic kidney disease), stage IV   . Normocytic anemia   . Pericardial effusion     Remote hx  . Cerebrovascular disease     Chronic lacunar infarcts  . NICM (nonischemic cardiomyopathy)   . CHF (congestive heart failure)   . Shortness of breath     Past Surgical History  Procedure Laterality Date  . Av fistula placement Left  10/19/2013    Procedure: ARTERIOVENOUS (AV) FISTULA CREATION- LEFT RADIOCEPHALIC;  Surgeon: Larina Earthlyodd F Early, MD;  Location: [facility] OR;  Service: Vascular;  Laterality: Left;    Ebbie LatusHaley Hawkins RD, LDN

## 2014-04-22 NOTE — Progress Notes (Signed)
Called by Baxter Hire, RN  that pt "clamped down on a mouth swab and may have had a seizure and is now unarousable".  On arrival to room pt lying in bed , being bathed by staff.  Responds to tactile stimulation by yelling out-which I am told is his baseline.  Nonverbal otherwise and follows no commands.  VSS: 139/82  97  18  97.4  97% RA O2 Sats. PERL 71mm Dr Lysbeth Galas notified of even by staff.  Dilantin to be given IV if pt not awake enough to take POs. CBG 110 Hand off report given to [facility]  0020 F/U  Pt apparently back to baseline per  staff.

## 2014-04-22 NOTE — Progress Notes (Signed)
Occupational Therapy Session Note  Patient Details  Name: Matthew Beard MRN: [id number] Date of Birth: Mar 19, 1976  Today's Date: 04/22/2014 Time: 1828-8337 Time Calculation (min): 45 min  Skilled Therapeutic Interventions/Progress Updates:    Patient seen this pm to address arousal, activity participation with functional mobility tasks.  Patient's fiance, Judeth Cornfield, present for this session.  She shared that patient enjoyed motorcycles, dancing, going out for dinner, sports - [place] basketball, and [employer] football.  Patient non-verbal this session, and seeming to hold liquid in his mouth.  Patient with hiccups throughout this session.  Patient with flat affect, but participative with transfers and short distance walking.   Therapy Documentation Precautions:  Precautions Precautions: Fall Restrictions Weight Bearing Restrictions: No  Pain: Per fiance history of back pain.  No indication of pain this session.    See FIM for current functional status  Therapy/Group: Individual Therapy  Collier Salina 04/22/2014, 3:54 PM

## 2014-04-22 NOTE — Progress Notes (Addendum)
[facility] Progress Note  Assessment: 1. 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 ESRD - changed to TTS for rehab stay -  unsafe aggressive behavior during HD not allowing us to use AVF at this time; is on seroquel for agitation. On lasix 80 bid for now.  Has perm cath in now - this is temporary -- [facility] will not accept back to OP center on catheter-dialysis if there is a functioning AVF or AVG in place; family is aware of this.  3. Anemia - Hgb 10 s/p transfusion 5/21 - Aranesp  100 scheduled for Monday - give today 6/2 4. Secondary hyperparathyroidism - on hectorol/renvela restarted - recheck labs pre HD today. 5. HTN- improved control on minoxidil 2.5 bid. Volume down as well. Will decrease gradually other BP meds starting w clonidine and hydralazine 6. Nutrition - NPO for procedure, ST following 7. Agitation- suspect related to CVA, aphasia, other effects of temporal lobe CVA. He is on seroquel, looks oversedated today. Will d/w primary 8. Seizure disorder- on dilantin 200 bid   Plan-  HD today, decrease BP meds, no heparin HD, minimal UF, I have reduced seroquel dose, d/c lasix  Vinson Moselleob Rodarius Kichline MD pager [phone]    cell [phone] 04/22/2014, 9:19 AM  Subjective:     Objective Filed Vitals:   04/21/14 1558 04/21/14 2250  BP: 96/61 138/83  Pulse: 81 91  Temp: 99.1 F (37.3 C) 98.2 F (36.8 C)  TempSrc: Axillary Oral  Resp: 16 18  Height: 5\' 6"  (1.676 m)   Weight: 61.9 kg (136 lb 7.4 oz)   SpO2: 100% 97%   Physical Exam Awake, sedated, falling asleep sitting up in a chair No jvd Chest clear bilat RRR no rub or gallop Abd soft, NTND No LE or UE edema R IJ cath, also LUA AVF patent Neuro is nf, nonverbal, nods head to some questions  Dialysis:  [facility]  4h 79kg 2/2.25 Bath 300/800 Heparin none due to bleed (was 8000) LUA AVF  Hectorol 4 Aranesp 100 Q Wed Venofer 100/hd thru    Additional Objective Labs: Basic Metabolic  Panel:  Recent Labs Lab 04/17/14 0930 04/18/14 0505 04/19/14 1056  NA 143 142 140  K 6.0* 4.6 5.2  CL 106 103 103  CO2 21 20 17*  GLUCOSE 83 82 96  BUN 60* 72* 84*  CREATININE 6.99* 7.24* 7.37*  CALCIUM 8.5 8.3* 8.8  PHOS 6.7* 7.2*  --    Liver Function Tests:  Recent Labs Lab 04/17/14 0930 04/18/14 0505 04/20/14 0711  ALBUMIN 2.7* 2.8* 3.0*   CBC:  Recent Labs Lab 04/18/14 0505  WBC 5.5  HGB 10.0*  HCT 31.6*  MCV 95.2  PLT 126*   CBG:  Recent Labs Lab 04/22/14 0022  GLUCAP 110*  Medications:   . amLODipine  10 mg Oral Q2200  . carvedilol  25 mg Oral BID WC  . cloNIDine  0.3 mg Oral TID  . [START ON 04/27/2014] darbepoetin (ARANESP) injection - DIALYSIS  100 mcg Intravenous Q Tue-HD  . doxercalciferol  2 mcg Intravenous Q T,Th,Sa-HD  . feeding supplement (ENSURE)  1 Container Oral BID  . feeding supplement (NEPRO CARB STEADY)  237 mL Oral Daily  . furosemide  80 mg Oral BID  . hydrALAZINE  100 mg Oral 3 times per day  . isosorbide mononitrate  30 mg Oral Daily  . levETIRAcetam  500 mg Oral BID  . lisinopril  20 mg Oral QHS  .  minoxidil  2.5 mg Oral BID  . multivitamin  1 tablet Oral QHS  . phenytoin  200 mg Oral BID  . QUEtiapine  25 mg Oral BID  . sevelamer carbonate  800 mg Oral TID WC

## 2014-04-22 NOTE — Evaluation (Signed)
Occupational Therapy Assessment and Plan  Patient Details  Name: Matthew Beard MRN: [id number] Date of Birth: 1976/08/21  OT Diagnosis: abnormal posture, altered mental status, cognitive deficits, disturbance of vision, hemiplegia affecting non-dominant side, lumbago (low back pain) and muscle weakness (generalized) Rehab Potential: Rehab Potential: Good ELOS: 3 weeks - 21 days   Today's Date: 04/22/2014 Time: 0800-0900 Time Calculation (min): 60 min  Problem List:  Patient Active Problem List   Diagnosis Date Noted  . Convulsions/seizures 04/07/2014  . Encephalopathy 04/07/2014  . Aspiration pneumonia 04/07/2014  . Dysphagia, unspecified(787.20) 04/07/2014  . Noncompliance with medication regimen in past- mult adm for uncontrolled HTN 03/31/2014  . Intracerebral hemorrhage 03/30/2014  . ICH (intracerebral hemorrhage) 03/30/2014  . ESRD- HD since April 2015 03/08/2014  . Cardiomyopathy EF 25-30%- etiol undetermined- EF varies- 20% Aug 2014, 40% Nov 2014, 50% March 2015 09/11/2013  . Autoimmune disorder 09/11/2013  . History of noncompliance with medical treatment 07/04/2013  . Uncontrolled hypertension 08/10/2011  . Anemia in chronic kidney disease 08/10/2011  . Mixed connective tissue disease 08/10/2011    Past Medical History:  Past Medical History  Diagnosis Date  . Hypertension   . CKD (chronic kidney disease), stage IV   . Normocytic anemia   . Pericardial effusion     Remote hx  . Cerebrovascular disease     Chronic lacunar infarcts  . NICM (nonischemic cardiomyopathy)   . CHF (congestive heart failure)   . Shortness of breath    Past Surgical History:  Past Surgical History  Procedure Laterality Date  . Av fistula placement Left 10/19/2013    Procedure: ARTERIOVENOUS (AV) FISTULA CREATION- LEFT RADIOCEPHALIC;  Surgeon: Rosetta Posner, MD;  Location: [facility] OR;  Service: Vascular;  Laterality: Left;    Assessment & Plan Clinical Impression: Patient is a 38 y.o.  year old male with history of end-stage renal disease with new start hemodialysis, nonischemic cardiomyopathy, hypertension, diastolic congestive heart failure history of chronic lacunar infarcts as well as medical noncompliance. Admitted 03/30/2014 after questionable syncopal episode with fall versus seizure. She did require intubation in the ED for agitation. He was loaded with Keppra as well as the addition of Dilantin. Cranial CT scan showed focus of parenchymal hemorrhage at the junction of the left temporal and left parietal lobe measuring 1.6 x 1.6 cm. CT angiogram head with no intracranial aneurysm identified no evidence of significant proximal stenosis. EEG consistent with generalized nonspecific cerebral dysfunction. He was extubated and 04/02/2014. Renal service followup hemodialysis as directed with latest creatinine 7.37 and Perm cath placed 04/20/2014 . Followup MRI 03/31/2014 with noted widespread micro-bleeds, chronic corpus callosum hemorrhagic infarct as well as multiple subcentimeter acute infarcts throughout the supratentorial region and repeat CT of the head latest being 04/16/2014 showing no acute changes. Presently maintained on mechanical soft nectar thick liquids. Bouts of restlessness and agitation needing some encouragement to participate with dialysis and maintained on Seroquel.  Patient was admitted for comprehensive rehabilitation program  Patient transferred to CIR on 04/21/2014 .    Patient currently requires total with basic self-care skills secondary to muscle weakness, fluctuating BP, abnormal tone, unbalanced muscle activation and decreased coordination, decreased visual acuity, decreased visual perceptual skills and decreased visual motor skills, decreased midline orientation and decreased attention to left and decreased initiation, decreased attention and decreased awareness.  Prior to hospitalization, patient could complete basic ADL with independent .  Patient will benefit  from skilled intervention to decrease level of assist with basic self-care skills  and increase independence with basic self-care skills prior to discharge home with care partner.  Anticipate patient will require 24 hour supervision and minimal physical assistance and follow up home health.  OT - End of Session Activity Tolerance: Decreased this session Endurance Deficit: Yes Endurance Deficit Description: Difficult to assess due to lethargy OT Assessment Rehab Potential: Good OT Patient demonstrates impairments in the following area(s): Balance;Behavior;Cognition;Endurance;Motor;Nutrition;Pain;Perception;Safety;Sensory;Skin Integrity;Vision OT Basic ADL's Functional Problem(s): Eating;Grooming;Bathing;Dressing;Toileting OT Transfers Functional Problem(s): Toilet;Tub/Shower OT Additional Impairment(s): Fuctional Use of Upper Extremity OT Plan OT Intensity: Minimum of 1-2 x/day, 45 to 90 minutes OT Frequency: 5 out of 7 days OT Duration/Estimated Length of Stay: 3 weeks - 21 days OT Treatment/Interventions: Balance/vestibular training;Disease mangement/prevention;Neuromuscular re-education;Patient/family education;Self Care/advanced ADL retraining;Therapeutic Exercise;UE/LE Coordination activities;Cognitive remediation/compensation;Discharge planning;DME/adaptive equipment instruction;Functional mobility training;Pain management;Psychosocial support;Skin care/wound managment;Therapeutic Activities;UE/LE Strength taining/ROM;Visual/perceptual remediation/compensation OT Self Feeding Anticipated Outcome(s): supervision OT Basic Self-Care Anticipated Outcome(s): min assist OT Toileting Anticipated Outcome(s): supervision OT Bathroom Transfers Anticipated Outcome(s): supervision OT Recommendation Patient destination: Home Follow Up Recommendations: Home health OT;Outpatient OT Equipment Recommended: To be determined   Skilled Therapeutic Intervention Patient seen this am for OT assessment.  Patient initially awake and alert yet non verbal.  Patient became increasingly lethargic as session went on.  Patient able to sit in regular chair and feed self breakfast, but then unable to maintain arousal so unable to continue.  Patient with flat affect, but easily lead from activity to another.  Patient did have difficulty transitioning from stand to sit.  Need further assessment of visual skills as patient better able to maintain arousal.    OT Evaluation Precautions/Restrictions  Precautions Precautions: Fall Precaution Comments: seizure Restrictions Weight Bearing Restrictions: No General Chart Reviewed: Yes Additional Pertinent History: fiance indicates history of back pain Pain Pain Assessment Pain Assessment: Faces Pain Score: Asleep Faces Pain Scale: No hurt Home Living/Prior Functioning Home Living Family/patient expects to be discharged to:: Private residence Living Arrangements: Spouse/significant other Available Help at Discharge:  (working on 24  hour care) Type of Home: House (per chart review) Additional Comments: Will need to verify this information; no family present on PT evaluation  Lives With: Significant other Prior Function Vocation: Unemployed (per chart review ) Comments: Unable to verify PLOF   Vision/Perception  Vision- History Baseline Vision/History:  (No family present, patient non verbal during eval) Vision- Assessment Vision Assessment?: Vision impaired- to be further tested in functional context Additional Comments: Blank stare, flat affect.  No spontaneous scanning noted left or right - patient becoming increasingly lethargic with time Praxis Praxis-Other Comments: Need further assessment  Cognition Overall Cognitive Status: Impaired/Different from baseline Arousal/Alertness: Suspect due to medications Orientation Level: Other (comment) (non verbal, no - yes no responses witnessed) Attention: Focused Focused Attention: Impaired Focused  Attention Impairment: Functional basic;Verbal basic Memory: Impaired Memory Impairment: Storage deficit Awareness: Impaired Awareness Impairment: Intellectual impairment Problem Solving: Impaired Problem Solving Impairment: Verbal basic Behaviors:  (flat affect, decreased arousal) Safety/Judgment: Impaired Comments: Pt was minimally responsive to auditory and tactile stim including sternal rub Sensation Sensation Light Touch: Not tested Stereognosis: Not tested Hot/Cold: Not tested Proprioception: Not tested Additional Comments: Unable to assess secondary to decreased pt arousal Coordination Gross Motor Movements are Fluid and Coordinated: Yes (right UE) Fine Motor Movements are Fluid and Coordinated: Yes (Right UE) Motor  Motor Motor: Motor impersistence;Abnormal postural alignment and control Motor - Skilled Clinical Observations: Question hemiparesis - left Mobility  Bed Mobility Bed Mobility: Sit to Supine;Supine to Sit;Scooting to HOB Supine to  Sit: 1: +1 Total assist;HOB elevated Supine to Sit Details (indicate cue type and reason): secondary to decreased pt arousal Sit to Supine: HOB elevated;1: +1 Total assist Sit to Supine - Details (indicate cue type and reason): secondary to decreased pt arousal Scooting to [facility]: 1: +2 Total assist Scooting to [facility]: Patient Percentage: 0% Scooting to [facility] Details (indicate cue type and reason): secondary to decreased pt arousal  Trunk/Postural Assessment  Cervical Assessment Cervical Assessment: Exceptions to [facility] (Cervical flexion with capital extension - mild) Thoracic Assessment Thoracic Assessment: Exceptions to [facility] (rigid thoracic extension in standing) Lumbar Assessment Lumbar Assessment:  (sits with posterior pelvic tilt) Postural Control Postural Control: Deficits on evaluation (trunk extends behind base of support in standing, lists backward and laterally without awareness or correction)  Balance Balance Balance Assessed:  Yes Dynamic Sitting Balance Sitting balance - Comments: sits at edge of bed with supervision Extremity/Trunk Assessment RUE Assessment RUE Assessment:  (PROM WFL) LUE Assessment LUE Assessment: Within Functional Limits (PROM WFL)  FIM:  FIM - Eating Eating Activity: 4: Help with picking up utensils FIM - Grooming Grooming: 1: Patient completes 0 of 4 or 1 of 5 steps, or requires 2 helpers FIM - Bathing Bathing: 1: Total-Patient completes 0-2 of 10 parts or less than 25% FIM - Upper Body Dressing/Undressing Upper body dressing/undressing: 1: Total-Patient completed less than 25% of tasks FIM - Lower Body Dressing/Undressing Lower body dressing/undressing: 1: Total-Patient completed less than 25% of tasks FIM - Toileting Toileting: 0: Activity did not occur FIM - Bed/Chair Transfer Bed/Chair Transfer: 2: Supine > Sit: Max A (lifting assist/Pt. 25-49%);3: Bed > Chair or W/C: Mod A (lift or lower assist);2: Chair or W/C > Bed: Max A (lift and lower assist);2: Sit > Supine: Max A (lifting assist/Pt. 25-49%) FIM - Air cabin crew Transfers: 0-Activity did not occur FIM - Tub/Shower Transfers Tub/shower Transfers: 0-Activity did not occur or was simulated   Refer to Care Plan for Long Term Goals  Recommendations for other services: None  Discharge Criteria: Patient will be discharged from OT if patient refuses treatment 3 consecutive times without medical reason, if treatment goals not met, if there is a change in medical status, if patient makes no progress towards goals or if patient is discharged from hospital.  The above assessment, treatment plan, treatment alternatives and goals were discussed and mutually agreed upon: No family available/patient unable  Mariah Milling 04/22/2014, 4:57 PM

## 2014-04-22 NOTE — Evaluation (Signed)
Speech Language Pathology Assessment and Plan  Patient Details  Name: Matthew Beard MRN: [id number] Date of Birth: [birth year]  SLP Diagnosis: Aphasia;Cognitive Impairments;Dysphagia  Rehab Potential: Good ELOS: 21-28 days   Today's Date: 04/22/2014 Time: 9509-3267 Time Calculation (min): 42 min  Problem List:  Patient Active Problem List   Diagnosis Date Noted  . Convulsions/seizures 04/07/2014  . Encephalopathy 04/07/2014  . Aspiration pneumonia 04/07/2014  . Dysphagia, unspecified(787.20) 04/07/2014  . Noncompliance with medication regimen in past- mult adm for uncontrolled HTN 03/31/2014  . Intracerebral hemorrhage 03/30/2014  . ICH (intracerebral hemorrhage) 03/30/2014  . ESRD- HD since April 2015 03/08/2014  . Cardiomyopathy EF 25-30%- etiol undetermined- EF varies- 20% Aug 2014, 40% Nov 2014, 50% March 2015 09/11/2013  . Autoimmune disorder 09/11/2013  . History of noncompliance with medical treatment 07/04/2013  . Uncontrolled hypertension 08/10/2011  . Anemia in chronic kidney disease 08/10/2011  . Mixed connective tissue disease 08/10/2011   Past Medical History:  Past Medical History  Diagnosis Date  . Hypertension   . CKD (chronic kidney disease), stage IV   . Normocytic anemia   . Pericardial effusion     Remote hx  . Cerebrovascular disease     Chronic lacunar infarcts  . NICM (nonischemic cardiomyopathy)   . CHF (congestive heart failure)   . Shortness of breath    Past Surgical History:  Past Surgical History  Procedure Laterality Date  . Av fistula placement Left 10/19/2013    Procedure: ARTERIOVENOUS (AV) FISTULA CREATION- LEFT RADIOCEPHALIC;  Surgeon: Rosetta Posner, MD;  Location: [facility] OR;  Service: Vascular;  Laterality: Left;    Assessment / Plan / Recommendation Clinical Impression  Pieter PAUL TRETTIN is a 38 y.o. right-handed male with history of end-stage renal disease with new start hemodialysis, nonischemic cardiomyopathy, hypertension, diastolic  congestive heart failure history of chronic lacunar infarcts as well as medical noncompliance. Admitted 03/30/2014 after questionable syncopal episode with fall versus seizure. He did require intubation in the ED for agitation. He was loaded with Keppra as well as the addition of Dilantin. Cranial CT scan showed focus of parenchymal hemorrhage at the junction of the left temporal and left parietal lobe measuring 1.6 x 1.6 cm. CT angiogram head with no intracranial aneurysm identified no evidence of significant proximal stenosis. EEG consistent with generalized nonspecific cerebral dysfunction. He was extubated and 04/02/2014. Followup MRI 03/31/2014 with noted widespread micro-bleeds, chronic corpus callosum hemorrhagic infarct as well as multiple subcentimeter acute infarcts throughout the supratentorial region and repeat CT of the head latest being 04/16/2014 showing no acute changes. Presently maintained on mechanical soft nectar thick liquids. Bouts of restlessness and agitation needing some encouragement to participate with dialysis and maintained on Seroquel. Physical therapy evaluation completed 04/07/2014. . M.D. has requested physical medicine rehabilitation consult. Patient was admitted for comprehensive rehabilitation program on 04/21/14 and was seen for a cognitive-linguistic and bedside swallowing evaluation on 04/22/14 with the following results: Pt presents with significant cognitive-linguistic impairments primarily related decreased arousal and sustained attention.  Throughout the evaluation, pt was minimally responsive to maximum auditory and tactile stim, including sternal rub.   Due to lethargy, pt was unable to follow 1-step directions in a functional context and was noted with overall poor awareness of his environment.  Pt did not exhibit any appreciable verbalizations or attempts to initiate functional communication on eval; however, pt was noted with strong, clear voicing for vocalizations during  intermittent periods of agitation.  Trials of PO intake were limited on  this date secondary to lethargy, although pt was observed with small amounts of ice chips and puree with poor awareness of bolus and no appreciable initiation of movement to clear consistencies from his lips.  Furthermore, pt became orally defensive when SLP attempted oral care, biting down on toothette.  SLP set up suction for swallowing safety with PO due to fluctuating arousal.  Pt would benefit from skilled speech therapy while inpatient to maximize swallowing safety and cognitive-linguistic function in order to maximize functional independence and reduce burden of care upon discharge.    Skilled Therapeutic Interventions          Cognitive-linguistic evaluation completed with results and recommendations reviewed with family.     SLP Assessment  Patient will need skilled Speech Lanaguage Pathology Services during CIR admission    Recommendations  Diet Recommendations: Dysphagia 3 (Mechanical Soft);Nectar-thick liquid Liquid Administration via: Cup Medication Administration: Whole meds with puree Supervision: Full supervision/cueing for compensatory strategies;Staff to assist with self feeding Compensations: Slow rate;Small sips/bites;Check for pocketing;Check for anterior loss Postural Changes and/or Swallow Maneuvers: Seated upright 90 degrees Oral Care Recommendations: Oral care BID Recommendations for Other Services: Neuropsych consult Patient destination: Home Follow up Recommendations: 24 hour supervision/assistance;Home Health SLP Equipment Recommended: None recommended by SLP    SLP Frequency 5 out of 7 days   SLP Treatment/Interventions Cognitive remediation/compensation;Cueing hierarchy;Dysphagia/aspiration precaution training;Functional tasks;Environmental controls;Internal/external aids;Patient/family education    Pain Pain Assessment Pain Assessment: No/denies pain Prior Functioning Cognitive/Linguistic  Baseline: Information not available (PM&R pre admission consult indicated pt needed assistance for mobility at wheel chair level at baseline, suspect pt's cognition was grossly intact at baseline) Type of Home: House (per chart review)  Lives With: Significant other (per chart review) Available Help at Discharge: Family;Available 24 hours/day (per chart review) Vocation: Unemployed (per chart review )  Short Term Goals: Week 1: SLP Short Term Goal 1 (Week 1): Pt will tolerate dys 3 textures and nectar thick liquids with mod assist for swallowing precautions with no overt s/s of aspiration  SLP Short Term Goal 2 (Week 1): Pt will tolerate trials of upgraded textures and consistencies with no overt s/s of aspiration for ongoing assessment for diet advancement SLP Short Term Goal 3 (Week 1): Pt will communicate basic needs/wants via any modality (pointing, gestures, verbalizations, vocalizations, etc.) for 75% accuracy with mod-max assist  SLP Short Term Goal 4 (Week 1): Pt will sustain attention to a basic task for 3-5 minutes with mod-max assist for redirection  SLP Short Term Goal 5 (Week 1): Pt will improve basic problem solving for functional ADLs and self care tasks for 75% accuracy and mod assist.   SLP Short Term Goal 6 (Week 1): Pt will improve comprehension for basic yes/no questions and 1-2 step directions for 75% accuracy with mod-max assist.   See FIM for current functional status Refer to Care Plan for Long Term Goals  Recommendations for other services: Neuropsych  Discharge Criteria: Patient will be discharged from SLP if patient refuses treatment 3 consecutive times without medical reason, if treatment goals not met, if there is a change in medical status, if patient makes no progress towards goals or if patient is discharged from hospital.  The above assessment, treatment plan, treatment alternatives and goals were discussed and mutually agreed upon: No family available/patient  unable  Windell Moulding, M.A. CCC-SLP  Selinda Orion  04/22/2014, 10:52 AM

## 2014-04-23 ENCOUNTER — Encounter (HOSPITAL_COMMUNITY): Payer: Self-pay

## 2014-04-23 ENCOUNTER — Inpatient Hospital Stay (HOSPITAL_COMMUNITY): Payer: Self-pay

## 2014-04-23 ENCOUNTER — Ambulatory Visit (HOSPITAL_COMMUNITY): Payer: Self-pay | Admitting: Occupational Therapy

## 2014-04-23 DIAGNOSIS — I10 Essential (primary) hypertension: Secondary | ICD-10-CM

## 2014-04-23 DIAGNOSIS — D631 Anemia in chronic kidney disease: Secondary | ICD-10-CM

## 2014-04-23 DIAGNOSIS — I619 Nontraumatic intracerebral hemorrhage, unspecified: Secondary | ICD-10-CM

## 2014-04-23 DIAGNOSIS — M3589 Other specified systemic involvement of connective tissue: Secondary | ICD-10-CM

## 2014-04-23 DIAGNOSIS — M358 Other specified systemic involvement of connective tissue: Secondary | ICD-10-CM

## 2014-04-23 DIAGNOSIS — N039 Chronic nephritic syndrome with unspecified morphologic changes: Secondary | ICD-10-CM

## 2014-04-23 LAB — CBC
HCT: 31.4 % — ABNORMAL LOW (ref 39.0–52.0)
Hemoglobin: 10.2 g/dL — ABNORMAL LOW (ref 13.0–17.0)
MCH: 30 pg (ref 26.0–34.0)
MCHC: 32.5 g/dL (ref 30.0–36.0)
MCV: 92.4 fL (ref 78.0–100.0)
PLATELETS: 114 10*3/uL — AB (ref 150–400)
RBC: 3.4 MIL/uL — ABNORMAL LOW (ref 4.22–5.81)
RDW: 16.3 % — ABNORMAL HIGH (ref 11.5–15.5)
WBC: 4.9 10*3/uL (ref 4.0–10.5)

## 2014-04-23 LAB — RENAL FUNCTION PANEL
ALBUMIN: 3 g/dL — AB (ref 3.5–5.2)
BUN: 57 mg/dL — ABNORMAL HIGH (ref 6–23)
CO2: 22 mEq/L (ref 19–32)
Calcium: 8.6 mg/dL (ref 8.4–10.5)
Chloride: 97 mEq/L (ref 96–112)
Creatinine, Ser: 7.25 mg/dL — ABNORMAL HIGH (ref 0.50–1.35)
GFR calc Af Amer: 10 mL/min — ABNORMAL LOW (ref >90)
GFR calc non Af Amer: 8 mL/min — ABNORMAL LOW (ref >90)
Glucose, Bld: 95 mg/dL (ref 70–99)
PHOSPHORUS: 7.4 mg/dL — AB (ref 2.3–4.6)
POTASSIUM: 3.7 meq/L (ref 3.7–5.3)
Sodium: 139 mEq/L (ref 137–147)

## 2014-04-23 NOTE — Progress Notes (Signed)
Palliative medicine consult received from Dr. Arlean Hopping to discuss goals of care./ This has been set for Saturday AM 6/6 at 11AM.   Anderson Malta, DO Palliative Medicine

## 2014-04-23 NOTE — Progress Notes (Signed)
Speech Language Pathology Daily Session Note  Patient Details  Name: Matthew Beard MRN: [id number] Date of Birth: [birth year]  Today's Date: 04/23/2014 Time: 9476-5465 Time Calculation (min): 42 min  Short Term Goals: Week 1: SLP Short Term Goal 1 (Week 1): Pt will tolerate dys 3 textures and nectar thick liquids with mod assist for swallowing precautions with no overt s/s of aspiration  SLP Short Term Goal 2 (Week 1): Pt will tolerate trials of upgraded textures and consistencies with no overt s/s of aspiration for ongoing assessment for diet advancement SLP Short Term Goal 3 (Week 1): Pt will communicate basic needs/wants via any modality (pointing, gestures, verbalizations, vocalizations, etc.) for 75% accuracy with mod-max assist  SLP Short Term Goal 4 (Week 1): Pt will sustain attention to a basic task for 3-5 minutes with mod-max assist for redirection  SLP Short Term Goal 5 (Week 1): Pt will improve basic problem solving for functional ADLs and self care tasks for 75% accuracy and mod assist.   SLP Short Term Goal 6 (Week 1): Pt will improve comprehension for basic yes/no questions and 1-2 step directions for 75% accuracy with mod-max assist.   Skilled Therapeutic Interventions:  Pt was seen for skilled speech therapy targeting expressive and receptive language as well as dysphagia management.  Upon arrival, pt was noted with x1 spontaneous utterance for social responses, saying "okay" when asked how he was doing.  Pt was also noted to verbalize "no" x1 to indicate that he did not want to eat any more.  Pt was observed with presentations of his prescribed diet with 1 overt s/s of aspiration characterized by a delayed cough following cup sips of nectar thick liquids.  Suspect cough was related to size of cup sip as pt required max hand over hand assist to use small sips.  Pt was noted to follow 1-step directions in context related to his self care (i.e. Dressing, oral care, and eating) with  mod-max cuing for initiation.    Pt will benefit from therapeutic tasks being presented in a functional context as he appears to have some volitional refusal of structured tasks.  Pt would also benefit from being up out of bed for therapy as he becomes easily fatigued and more difficult to redirect.    FIM:  Comprehension Comprehension Mode: Auditory Comprehension: 2-Understands basic 25 - 49% of the time/requires cueing 51 - 75% of the time Expression Expression Mode: Verbal Expression: 1-Expresses basis less than 25% of the time/requires cueing greater than 75% of the time. Social Interaction Social Interaction: 2-Interacts appropriately 25 - 49% of time - Needs frequent redirection. Problem Solving Problem Solving: 2-Solves basic 25 - 49% of the time - needs direction more than half the time to initiate, plan or complete simple activities Memory Memory: 1-Recognizes or recalls less than 25% of the time/requires cueing greater than 75% of the time FIM - Eating Eating Activity: 4: Help with picking up utensils;4: Helper occasionally brings food to mouth  Pain Pain Assessment Pain Assessment: No/denies pain  Therapy/Group: Individual Therapy  Jackalyn Lombard, M.A. CCC-SLP   [name] 04/23/2014, 4:50 PM

## 2014-04-23 NOTE — Progress Notes (Signed)
[facility] Progress Note  Assessment: 1. 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 ESRD - changed to TTS for rehab stay -  unsafe aggressive behavior during HD not allowing [place] to use AVF at this time.   Has perm cath in now - this is temporary -- [facility] will not accept back to OP center on catheter-dialysis if there is a functioning AVF or AVG in place; family is aware of this.  3. Anemia - Hgb 10 s/p transfusion 5/21 - Aranesp  100 scheduled for Monday - give today 6/2 4. Secondary hyperparathyroidism - on hectorol/renvela 5. HTN- volume down, on multiple BP meds, BP better control on minoxidil 6. Agitation- seroquel stopped due to oversedation, have d/w primary 7. Seizure disorder- on dilantin 200 bid   Plan-  HD today. Will need to retry sticking AVF on Monday.  Overall this is a very difficult situation.  Pt has multiple severe comorbidites from long-term uncontrolled HTN and is now not competent to make decisions after a hemorrhagic stroke and not cooperative with AVF dialysis.  Vinson Moselle MD pager [phone]    cell [phone] 04/23/2014, 1:09 PM  Subjective:     Objective Filed Vitals:   04/23/14 1025 04/23/14 1057 04/23/14 1128 04/23/14 1150  BP: 134/78 149/96 148/89 148/89  Pulse: 99 98 101 102  Temp:    97.6 F (36.4 C)  TempSrc:    Axillary  Resp:      Height:      Weight:    60.7 kg (133 lb 13.1 oz)  SpO2:    94%   Physical Exam Awake, sedated, falling asleep sitting up in a chair No jvd Chest clear bilat RRR no rub or gallop Abd soft, NTND No LE or UE edema R IJ cath, also LUA AVF patent Neuro is nf, nonverbal, nods head to some questions  Dialysis:  [facility]  4h 79kg 2/2.25 Bath 300/800 Heparin none due to bleed (was 8000) LUA AVF  Hectorol 4 Aranesp 100 Q Wed Venofer 100/hd thru    Additional Objective Labs: Basic Metabolic Panel:  Recent Labs Lab 04/17/14 0930 04/18/14 0505 04/19/14 1056 04/22/14 1543  04/23/14 0613  NA 143 142 140 133* 139  K 6.0* 4.6 5.2 6.1* 3.7  CL 106 103 103 91* 97  CO2 21 20 17* 22 22  GLUCOSE 83 82 96 95 95  BUN 60* 72* 84* 74* 57*  CREATININE 6.99* 7.24* 7.37* 7.85* 7.25*  CALCIUM 8.5 8.3* 8.8 9.0 8.6  PHOS 6.7* 7.2*  --   --  7.4*   Liver Function Tests:  Recent Labs Lab 04/18/14 0505 04/20/14 0711 04/23/14 0613  ALBUMIN 2.8* 3.0* 3.0*   CBC:  Recent Labs Lab 04/18/14 0505  WBC 5.5  HGB 10.0*  HCT 31.6*  MCV 95.2  PLT 126*   CBG:  Recent Labs Lab 04/22/14 0022  GLUCAP 110*  Medications:   . carvedilol  25 mg Oral BID WC  . cloNIDine  0.1 mg Oral TID  . [START ON 04/27/2014] darbepoetin (ARANESP) injection - DIALYSIS  100 mcg Intravenous Q Tue-HD  . doxercalciferol  2 mcg Intravenous Q T,Th,Sa-HD  . feeding supplement (ENSURE)  1 Container Oral BID  . feeding supplement (NEPRO CARB STEADY)  237 mL Oral BID BM  . hydrALAZINE  100 mg Oral 3 times per day  . isosorbide mononitrate  30 mg Oral Daily  . levETIRAcetam  500 mg Oral BID  . lisinopril  20 mg Oral QHS  . minoxidil  2.5 mg Oral BID  . multivitamin  1 tablet Oral QHS  . phenytoin  200 mg Oral BID  . sevelamer carbonate  800 mg Oral TID WC

## 2014-04-23 NOTE — Progress Notes (Signed)
Physical Therapy Session Note  Patient Details  Name: Matthew Beard MRN: [id number] Date of Birth: September 10, 1976  Today's Date: 04/23/2014   Skilled Therapeutic Interventions/Progress Updates:  Pt missed 60 minutes of scheduled skilled physical therapy due to being off unit for HD. Will continue per current POC as appropriate.   Therapy Documentation Precautions:  Precautions Precautions: Fall Precaution Comments: seizure Restrictions Weight Bearing Restrictions: No General: Amount of Missed PT Time (min): 45 Minutes Missed Time Reason: Unavailable (comment) (off unit for HD)  See FIM for current functional status  Therapy/Group: Individual Therapy  Denzil Hughes 04/23/2014, 10:25 AM

## 2014-04-23 NOTE — Progress Notes (Addendum)
Physical Therapy Session Note  Patient Details  Name: Matthew Beard MRN: [id number] Date of Birth: 1976-08-10  Today's Date: 04/23/2014 Time: 1445-1530 Time Calculation (min): 45 min  Short Term Goals: Week 1:  PT Short Term Goal 1 (Week 1): Pt will perform bed mobility with min A using bed rail with mod cueing for initiation. PT Short Term Goal 2 (Week 1): Pt will consistently perform sit<>stand with min A and mod cueing for initiation. PT Short Term Goal 3 (Week 1): Pt will demonstrate focused attention to mobility task for >15 seconds in controlled environment with max cueing. PT Short Term Goal 4 (Week 1): Pt will perform gait x25' in controlled environment with min A.  Skilled Therapeutic Interventions/Progress Updates:  1:1. Pt received semi-reclined in bed, EO but no response or focused attention noted to therapist's presence or throughout session. Pt noted to have strong hiccups, RN made aware. Focus this session on alertness/arousal, initiation, completion of single step commands, functional transfers and ambulation. Pt demonstrating difficulty initiating movement and occasionally able to if given significantly increased time. With therapist facilitating appropriate weight shift, pt demonstrating significantly increased ability to complete movement. Overall pt req min-mod A for t/f sup>sit EOB, multiple SPT bed<>w/c<>arm chair as well as ambulation w/ B HHA 75'x2 (mod A for LOB 1x to L). In standing, pt demonstrates variable L lean and multimodal cues to maintain more fluid pattern. Pt semi-reclined in bed at end of session w/ all needs in reach, bed alarm on, girlfriend and SW in room.   Therapy Documentation Precautions:  Precautions Precautions: Fall Precaution Comments: seizure Restrictions Weight Bearing Restrictions: No  See FIM for current functional status  Therapy/Group: Individual Therapy  Denzil Hughes 04/23/2014, 6:01 PM

## 2014-04-23 NOTE — Progress Notes (Addendum)
Occupational Therapy Note  Patient Details  Name: Matthew Beard MRN: [id number] Date of Birth: 03/30/1976 Today's Date: 04/23/2014  Pt missed 60 mins skilled OT services.  Pt off unit for HD.   Rich Brave 04/23/2014, 9:26 AM

## 2014-04-23 NOTE — Care Management Note (Signed)
[facility] Individual Statement of Services  Patient Name:  Matthew Beard  Date:  04/23/2014  Welcome to the [facility]patient [facility].  Our goal is to provide you with an individualized program based on your diagnosis and situation, designed to meet your specific needs.  With this comprehensive rehabilitation program, you will be expected to participate in at least 3 hours of rehabilitation therapies Monday-Friday, with modified therapy programming on the weekends.  Your rehabilitation program will include the following services:  Physical Therapy (PT), Occupational Therapy (OT), Speech Therapy (ST), 24 hour per day rehabilitation nursing, Therapeutic Recreaction (TR), Neuropsychology, Case Management (Social Worker), Rehabilitation Medicine, Nutrition Services and Pharmacy Services  Weekly team conferences will be held on Tuesdays to discuss your progress.  Your Social Worker will talk with you frequently to get your input and to update you on team discussions.  Team conferences with you and your family in attendance may also be held.  Expected length of stay: 21 days  Overall anticipated outcome: supervision/ min assist  Depending on your progress and recovery, your program may change. Your Social Worker will coordinate services and will keep you informed of any changes. Your Social Worker's name and contact numbers are listed  below.  The following services may also be recommended but are not provided by the [facility]patient [facility]:   Driving Evaluations  Home Health Rehabiltiation Services  Outpatient Rehabilitation Services  Vocational Rehabilitation   Arrangements will be made to provide these services after discharge if needed.  Arrangements include referral to agencies that provide these services.  Your insurance has been verified to be:  Medicaid ([employer]) Your primary doctor is:  Dr. Hyman Hopes  Pertinent information will be shared with  your doctor and your insurance company.  Social Worker:  Amada Jupiter, [place] [phone] or ([phone]   Information discussed with and copy given to patient by: Amada Jupiter, 04/23/2014, 2:07 PM

## 2014-04-23 NOTE — IPOC Note (Signed)
Overall Plan of Care [facility]) Patient Details Name: Matthew Beard MRN: [id number] DOB: 13-Sep-1976  Admitting Diagnosis: L ICH WITH HD  Hospital Problems: Active Problems:   ICH (intracerebral hemorrhage)     Functional Problem List: Nursing Bladder;Bowel;Medication Management;Motor;Pain;Perception;Safety;Skin Integrity  PT Balance;Endurance;Motor;Sensory;Perception;Safety  OT Balance;Behavior;Cognition;Endurance;Motor;Nutrition;Pain;Perception;Safety;Sensory;Skin Integrity;Vision  SLP Cognition;Nutrition;Safety  TR         Basic ADL's: OT Eating;Grooming;Bathing;Dressing;Toileting     Advanced  ADL's: OT       Transfers: PT Bed Mobility;Bed to Chair;Car;Furniture  OT Toilet;Tub/Shower     Locomotion: PT Ambulation;Wheelchair Mobility     Additional Impairments: OT Fuctional Use of Upper Extremity  SLP Swallowing;Communication;Social Cognition comprehension;expression Social Interaction;Problem Solving;Memory;Attention;Awareness  TR      Anticipated Outcomes Item Anticipated Outcome  Self Feeding supervision  Swallowing  supervision   Basic self-care  min assist  Toileting  supervision   Bathroom Transfers supervision  Bowel/Bladder  cont of bowel and bladder  Transfers  Supervision  Locomotion  Supervision to Min A  Communication  min assist   Cognition  supervision-min assist   Pain  less than 2  Safety/Judgment  adhere to safety protocol   Therapy Plan: PT Intensity: Minimum of 1-2 x/day ,45 to 90 minutes PT Frequency: 5 out of 7 days PT Duration Estimated Length of Stay: 21 days OT Intensity: Minimum of 1-2 x/day, 45 to 90 minutes OT Frequency: 5 out of 7 days OT Duration/Estimated Length of Stay: 3 weeks - 21 days SLP Intensity: Minumum of 1-2 x/day, 30 to 90 minutes SLP Frequency: 5 out of 7 days SLP Duration/Estimated Length of Stay: 21-28 days       Team Interventions: Nursing Interventions Patient/Family Education;Pain  Management;Bowel Management;Bladder Management;Medication Management;Psychosocial Support;Discharge Planning  PT interventions Ambulation/gait training;Balance/vestibular training;Cognitive remediation/compensation;Disease management/prevention;Discharge planning;Functional mobility training;Patient/family education;Therapeutic Exercise;Therapeutic Activities;UE/LE Coordination activities;Wheelchair propulsion/positioning;UE/LE Strength taining/ROM;Stair training;Neuromuscular re-education;DME/adaptive equipment instruction  OT Interventions Balance/vestibular training;Disease mangement/prevention;Neuromuscular re-education;Patient/family education;Self Care/advanced ADL retraining;Therapeutic Exercise;UE/LE Coordination activities;Cognitive remediation/compensation;Discharge planning;DME/adaptive equipment instruction;Functional mobility training;Pain management;Psychosocial support;Skin care/wound managment;Therapeutic Activities;UE/LE Strength taining/ROM;Visual/perceptual remediation/compensation  SLP Interventions Cognitive remediation/compensation;Cueing hierarchy;Dysphagia/aspiration precaution training;Functional tasks;Environmental controls;Internal/external aids;Patient/family education  TR Interventions    SW/CM Interventions      Team Discharge Planning: Destination: PT-Skilled Nursing Facility (SNF) (TBD; Home vs SNF based on available family support, pt progress) ,OT- Home , SLP-Home Projected Follow-up: PT-24 hour supervision/assistance;Other (comment) (To be determined), OT-  Home health OT;Outpatient OT, SLP-24 hour supervision/assistance;Home Health SLP Projected Equipment Needs: PT-To be determined, OT- To be determined, SLP-None recommended by SLP Equipment Details: PT- , OT-  Patient/family involved in discharge planning: PT- Patient unable/family or caregiver not available,  OT-Patient unable/family or caregiver not available, SLP-Patient unable/family or caregive not  available  MD ELOS: 21 days Medical Rehab Prognosis:  Good Assessment: The patient has been admitted for CIR therapies with the diagnosis of ICH. The team will be addressing functional mobility, strength, stamina, balance, safety, adaptive techniques and equipment, self-care, bowel and bladder mgt, patient and caregiver education, cognitive behavioral rx, stimulation, restoration of sleep-wake cycle, health hygiene, egosupport, initiation. Goals have been set at supervision to min assist for mobility, self-care, cognition/communication .    Ranelle Oyster, MD, FAAPMR      See Team Conference Notes for weekly updates to the plan of care

## 2014-04-23 NOTE — Progress Notes (Signed)
PHYSICAL MEDICINE & REHABILITATION     PROGRESS NOTE    Subjective/Complaints: Alert. Sitting in bed. Looking at TV ROS limited by poor cognitive status  Objective: Vital Signs: Blood pressure 136/85, pulse 92, temperature 97.6 F (36.4 C), temperature source Oral, resp. rate 19, height 5\' 6"  (1.676 m), weight 61.6 kg (135 lb 12.9 oz), SpO2 95.00%. No results found. No results found for this basename: WBC, HGB, HCT, PLT,  in the last 72 hours  Recent Labs  04/22/14 1543 04/23/14 0613  NA 133* 139  K 6.1* 3.7  CL 91* 97  GLUCOSE 95 95  BUN 74* 57*  CREATININE 7.85* 7.25*  CALCIUM 9.0 8.6   CBG (last 3)   Recent Labs  04/22/14 0022  GLUCAP 110*    Wt Readings from Last 3 Encounters:  04/23/14 61.6 kg (135 lb 12.9 oz)  04/20/14 63.7 kg (140 lb 6.9 oz)  03/15/14 81.012 kg (178 lb 9.6 oz)    Physical Exam:  Constitutional: He appears well-developed. Intermittent hiccops  HENT: oral mucosa pink  Head: Normocephalic.  Eyes:  Pupils round reactive to light  Neck: Normal range of motion. Neck supple. No thyromegaly present.  Cardiovascular: Normal rate and regular rhythm.  Respiratory: Effort normal and breath sounds normal. No respiratory distress.  GI: Soft. Bowel sounds are normal. He exhibits no distension.  Neurological:  Patient is awake but withdrawn. Delayed processing. Doesn't consistently follow commands.  Moves all 4's with significant delay today. Difficult to say if he favors one side over another. Senses pain in all 4's.  Skin: Skin is warm and dry.  Psychiatric:  Flat, poor initiation   Assessment/Plan: 1. Functional deficits secondary to ICH which require 3+ hours per day of interdisciplinary therapy in a comprehensive inpatient rehab setting. Physiatrist is providing close team supervision and 24 hour management of active medical problems listed below. Physiatrist and rehab team continue to assess barriers to discharge/monitor patient  progress toward functional and medical goals. FIM: FIM - Bathing Bathing: 1: Total-Patient completes 0-2 of 10 parts or less than 25%  FIM - Upper Body Dressing/Undressing Upper body dressing/undressing: 1: Total-Patient completed less than 25% of tasks FIM - Lower Body Dressing/Undressing Lower body dressing/undressing: 1: Total-Patient completed less than 25% of tasks  FIM - Toileting Toileting: 0: Activity did not occur  FIM - ArchivistToilet Transfers Toilet Transfers: 0-Activity did not occur  FIM - BankerBed/Chair Transfer Bed/Chair Transfer Assistive Devices: HOB elevated Bed/Chair Transfer: 2: Supine > Sit: Max A (lifting assist/Pt. 25-49%);3: Bed > Chair or W/C: Mod A (lift or lower assist);2: Chair or W/C > Bed: Max A (lift and lower assist);2: Sit > Supine: Max A (lifting assist/Pt. 25-49%)  FIM - Locomotion: Wheelchair Locomotion: Wheelchair: 0: Activity did not occur (Not assessed secondary to decreased arousal) FIM - Locomotion: Ambulation Locomotion: Ambulation: 0: Activity did not occur (Not assessed secondary to decreased arousal)  Comprehension Comprehension Mode: Auditory Comprehension: 2-Understands basic 25 - 49% of the time/requires cueing 51 - 75% of the time  Expression Expression Mode: Nonverbal Expression: 1-Expresses basis less than 25% of the time/requires cueing greater than 75% of the time.  Social Interaction Social Interaction: 1-Interacts appropriately less than 25% of the time. May be withdrawn or combative.  Problem Solving Problem Solving: 1-Solves basic less than 25% of the time - needs direction nearly all the time or does not effectively solve problems and may need a restraint for safety  Memory Memory: 1-Recognizes or recalls less than  25% of the time/requires cueing greater than 75% of the time  Medical Problem List and Plan:  1. Functional deficits secondary to ICH  2. DVT Prophylaxis/Anticoagulation: SCDs. Monitor for any signs of DVT  3. Pain  Management: Tylenol as needed  4. Seizure disorder. Keppra 500 mg every 12 hours, Dilantin 200 mg twice a day. Monitor for any signs of seizure  5. Neuropsych: This patient is not capable of making decisions on his own behalf.  6. End-stage renal disease with hemodialysis. Continue dialysis as per renal services.   PermCath placed a 04/20/2014. Therapies to scheduled prior to HD on HD days.  7. Dysphagia. Dysphagia 3 nectar thick liquids.   8. Hypertension. Norvasc 10 mg daily, Coreg 25 mg twice a day, clonidine 0.3 mg 3 times a day, hydralazine 100 mg every 8 hours, Imdur 30 mg daily and lisinopril 20 mg QHS,Minoxidil 2.5 mg BID.   -bp/volume mgt per nephrology 9. Diastolic congestive heart failure/NICM. Monitor for any signs of fluid overload. Continue Lasix 80 mg twice a day  10. Chronic anemia. Aranesp. Followup CBC with dialysis  11. Mood. Seroquel 25 mg twice a day.   12. Hiccups. Limited thorazine prn  LOS (Days) 2 A FACE TO FACE EVALUATION WAS PERFORMED  Ranelle Oyster 04/23/2014 8:14 AM

## 2014-04-24 ENCOUNTER — Inpatient Hospital Stay (HOSPITAL_COMMUNITY): Payer: Medicaid Other | Admitting: Speech Pathology

## 2014-04-24 ENCOUNTER — Inpatient Hospital Stay (HOSPITAL_COMMUNITY): Payer: Self-pay | Admitting: Physical Therapy

## 2014-04-24 DIAGNOSIS — I619 Nontraumatic intracerebral hemorrhage, unspecified: Secondary | ICD-10-CM

## 2014-04-24 DIAGNOSIS — IMO0002 Reserved for concepts with insufficient information to code with codable children: Secondary | ICD-10-CM

## 2014-04-24 DIAGNOSIS — R066 Hiccough: Secondary | ICD-10-CM

## 2014-04-24 DIAGNOSIS — Z515 Encounter for palliative care: Secondary | ICD-10-CM

## 2014-04-24 MED ORDER — PHENYTOIN 50 MG PO CHEW
200.0000 mg | CHEWABLE_TABLET | Freq: Two times a day (BID) | ORAL | Status: DC
  Administered 2014-04-24 – 2014-04-25 (×2): 200 mg via ORAL
  Filled 2014-04-24 (×4): qty 4

## 2014-04-24 MED ORDER — SENNOSIDES-DOCUSATE SODIUM 8.6-50 MG PO TABS
2.0000 | ORAL_TABLET | Freq: Once | ORAL | Status: AC
  Administered 2014-04-24: 2 via ORAL
  Filled 2014-04-24: qty 2

## 2014-04-24 MED ORDER — CLONIDINE HCL 0.2 MG/24HR TD PTWK
0.2000 mg | MEDICATED_PATCH | TRANSDERMAL | Status: DC
  Administered 2014-04-24: 0.2 mg via TRANSDERMAL
  Filled 2014-04-24 (×2): qty 1

## 2014-04-24 MED ORDER — DIAZEPAM 5 MG/ML PO CONC: 2.5000 mg | Freq: Four times a day (QID) | ORAL | Status: DC | PRN

## 2014-04-24 MED ORDER — HYDROMORPHONE HCL 2 MG PO TABS: 4.0000 mg | ORAL_TABLET | ORAL | Status: DC | PRN

## 2014-04-24 MED ORDER — SEVELAMER CARBONATE 0.8 G PO PACK
0.8000 g | PACK | Freq: Three times a day (TID) | ORAL | Status: DC
  Administered 2014-04-25: 0.8 g via ORAL
  Filled 2014-04-24 (×6): qty 1

## 2014-04-24 MED ORDER — METOCLOPRAMIDE HCL 10 MG PO TABS
10.0000 mg | ORAL_TABLET | Freq: Two times a day (BID) | ORAL | Status: DC
  Administered 2014-04-24 – 2014-04-25 (×3): 10 mg via ORAL
  Filled 2014-04-24 (×5): qty 1

## 2014-04-24 MED ORDER — DOXERCALCIFEROL 4 MCG/2ML IV SOLN: 2.0000 ug | INTRAVENOUS | Status: DC

## 2014-04-24 MED ORDER — BACLOFEN 5 MG HALF TABLET
5.0000 mg | ORAL_TABLET | Freq: Once | ORAL | Status: AC
  Administered 2014-04-24: 5 mg via ORAL
  Filled 2014-04-24: qty 1

## 2014-04-24 NOTE — Progress Notes (Signed)
Patient temp 100.9 this afternoon. MD Amador Cunas notified. New orders received. Tylenol 650 given. Patient asleep. Other vitals stable. Patient has very poor appetite- poor swallowing. Meds have had to be crushed in puree and given at slow rate. Patient pockets some and requires suctioning. Patient alert but does not follow most commands. Patient condom cath intact. Continue to monitor.

## 2014-04-24 NOTE — Progress Notes (Signed)
[facility] Progress Note   Subjective: Not following commands  Filed Vitals:   04/23/14 2107 04/23/14 2237 04/24/14 0539 04/24/14 0949  BP: 127/70 146/73 149/81 153/89  Pulse: 95 83 87   Temp:  100.9 F (38.3 C) 97.2 F (36.2 C)   TempSrc:  Oral Oral   Resp:  19 17   Height:      Weight:      SpO2:  95% 97%    Exam: Awake, will not follow commands, will not track, minimal eye contact No jvd Chest clear bilat RRR no rub or gallop Abd soft, NTND No LE or UE edema R IJ cath, also LUA AVF patent Neuro is nf, nonverbal, does not respond to any questions for me today  Dialysis:  [facility]  4h 79kg 2/2.25 Bath 300/800 Heparin none due to bleed (was 8000) LUA AVF  Hectorol 4 Aranesp 100 Q Wed Venofer 100/hd thru   Assessment: 1. 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 AMS / agitation- holding seroquel due to sedation. Dilantin level is OK, not toxic for ESRD. Ordered keppra level for today.  I have stopped prn thorazine.  Please hold all potentially sedating meds for the next few days while we are trying to get a new baseline MS for this patient. We are meeting with family and palliative care today.  4. ESRD on HD no heparin.  Back on MWF.  Unsafe aggressive behavior during HD not allowing us to use AVF at this time.  Has perm cath in now - this is temporary -- [facility] will not accept back to OP center on catheter-dialysis if there is a functioning AVF or AVG in place; family is aware of this.  5. Anemia on aranesp 6. Secondary hyperparathyroidism - on hectorol/renvela 7. HTN- volume down, on multiple BP meds, better control on minoxidil 8. Seizure disorder- on dilantin 200 bid and keppra 500 bid  Plan- as above  Vinson Moselleob Bron Snellings MD pager [phone]    cell [phone] 04/24/2014, 11:05 AM   Recent Labs Lab 04/18/14 0505 04/19/14 1056 04/22/14 1543 04/23/14 0613  NA 142 140 133* 139  K 4.6 5.2 6.1* 3.7  CL 103 103 91* 97  CO2 20 17* 22 22   GLUCOSE 82 96 95 95  BUN 72* 84* 74* 57*  CREATININE 7.24* 7.37* 7.85* 7.25*  CALCIUM 8.3* 8.8 9.0 8.6  PHOS 7.2*  --   --  7.4*    Recent Labs Lab 04/18/14 0505 04/20/14 0711 04/23/14 0613  ALBUMIN 2.8* 3.0* 3.0*    Recent Labs Lab 04/18/14 0505 04/23/14 2035  WBC 5.5 4.9  HGB 10.0* 10.2*  HCT 31.6* 31.4*  MCV 95.2 92.4  PLT 126* 114*   . carvedilol  25 mg Oral BID WC  . cloNIDine  0.1 mg Oral TID  . [START ON 04/27/2014] darbepoetin (ARANESP) injection - DIALYSIS  100 mcg Intravenous Q Tue-HD  . doxercalciferol  2 mcg Intravenous Q T,Th,Sa-HD  . feeding supplement (ENSURE)  1 Container Oral BID  . feeding supplement (NEPRO CARB STEADY)  237 mL Oral BID BM  . hydrALAZINE  100 mg Oral 3 times per day  . isosorbide mononitrate  30 mg Oral Daily  . levETIRAcetam  500 mg Oral BID  . lisinopril  20 mg Oral QHS  . minoxidil  2.5 mg Oral BID  . multivitamin  1 tablet Oral QHS  . phenytoin  200 mg Oral BID  . sevelamer carbonate  800 mg Oral TID WC     acetaminophen, feeding supplement (NEPRO CARB STEADY), food thickener, heparin, lidocaine-prilocaine, lidocaine-prilocaine, ondansetron (ZOFRAN) IV, ondansetron, sorbitol

## 2014-04-24 NOTE — Progress Notes (Signed)
Physical Therapy Session Note  Patient Details  Name: Matthew Beard MRN: [id number] Date of Birth: [birth year]  Today's Date: 04/24/2014 Time: 9937-1696 Time Calculation (min): 40 min  Short Term Goals: Week 1:  PT Short Term Goal 1 (Week 1): Pt will perform bed mobility with min A using bed rail with mod cueing for initiation. PT Short Term Goal 2 (Week 1): Pt will consistently perform sit<>stand with min A and mod cueing for initiation. PT Short Term Goal 3 (Week 1): Pt will demonstrate focused attention to mobility task for >15 seconds in controlled environment with max cueing. PT Short Term Goal 4 (Week 1): Pt will perform gait x25' in controlled environment with min A.  Skilled Therapeutic Interventions/Progress Updates:   Pt semi reclined in bed upon initiation of treatment, EO but limited interaction and eye contact. Pt performed bed mobility roll side to side with max vcs and mod assist, supine to sit mod assist with max vcs, sit to stand x 5 with mod assist, stand pivot transfers bed to wheelchair x 3 with mod assist and max vcs. Pt slightly verbally agitated and calmed down once returned to supine in bed. Sit to supine max assist to get B LEs onto bed.  Therapy Documentation Precautions:  Precautions Precautions: Fall Precaution Comments: seizure Restrictions Weight Bearing Restrictions: No General: Amount of Missed PT Time (min): 20 Minutes Missed Time Reason: Other (comment) (limited participation)  Pain:no c/o pain                         See FIM for current functional status  Therapy/Group: Individual Therapy  Jackelyn Knife 04/24/2014, 8:47 AM

## 2014-04-24 NOTE — Consult Note (Signed)
 @LOGODEPT @ [facility] Medicine Team [facility]  Date: 04/24/2014   Patient Name: Matthew Beard  DOB: 06-09-1976  MRN: [id number]  Age / Sex: 38 y.o., male   PCP: Matthew BuddyMartin W Webb, MD Referring Physician: Ranelle OysterZachary T Swartz, MD  Active Problems: 1. ICH, temporal lobe CVA  Significant functional status deficits and aphsia 2. ESRD on HD  Difficult access-cannot get HD with perm cath outside of hospital 3. Malignant Hypertension 4. 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 Matthew Beard has significant functional status limitations and aphasia, it has become increasingly hard to do HD on his due to aggressive behavior, pulling away from needles and he has difficult access issues. Renal consulted PMT for goals of care discussion.   Today [name] is mostly unresponsive, he cannot focus, intermittently follows commands-he has severe hiccups. He is agitated.   Participants in Discussion: Patient unable to participate, his brother, his SIL, and his girlfriend [name]-he has an older brother who could not attend meeting. Dr. Karene FrySchetz and I discussed his current condition-possible disease trajectory our limited treatment options and the option for comfort care.  Goals/Summary of Case:   Advance Directive: None on file   Code Status Orders        Start     Ordered   04/21/14 1600  Full code   Continuous     04/21/14 1600     I discussed code status- family considering options and we will re-meet tomorrow.   I have reviewed the medical record, interviewed the patient and family, and examined the patient. The following aspects are pertinent.  Past Medical History  Diagnosis Date  . Hypertension   . CKD (chronic kidney disease), stage IV   . Normocytic anemia   . Pericardial effusion     Remote hx  .  Cerebrovascular disease     Chronic lacunar infarcts  . NICM (nonischemic cardiomyopathy)   . CHF (congestive heart failure)   . Shortness of breath     History   Social History  . Marital Status: Single    Spouse Name: Beard/A    Number of Children: Beard/A  . Years of Education: Beard/A   Social History Main Topics  . Smoking status: Never Smoker   . Smokeless tobacco: Never Used  . Alcohol Use: No  . Drug Use: No  . Sexual Activity: Not Currently   Other Topics Concern  . Not on file   Social History Narrative  . No narrative on file    Family History  Problem Relation Age of Onset  . Hypertension Mother   . Stroke Mother   . Heart disease Mother     Heart Disease before age 39  . Hypertension Father   . Stroke Father   . Heart disease Father     Heart Disease before age 39  . Hypertension Brother    Scheduled Meds: . baclofen  5 mg Oral Once  . carvedilol  25 mg Oral BID WC  . cloNIDine  0.2 mg Transdermal Weekly  . [START ON 04/27/2014] darbepoetin (ARANESP) injection - DIALYSIS  100 mcg Intravenous Q Tue-HD  . [START ON 04/26/2014] doxercalciferol  2 mcg Intravenous Q M,W,F-HD  . feeding supplement (ENSURE)  1 Container Oral BID  . feeding supplement (NEPRO CARB STEADY)  237 mL Oral BID BM  . hydrALAZINE  100 mg Oral  3 times per day  . isosorbide mononitrate  30 mg Oral Daily  . levETIRAcetam  500 mg Oral BID  . lisinopril  20 mg Oral QHS  . metoCLOPramide  10 mg Oral BID  . minoxidil  2.5 mg Oral BID  . multivitamin  1 tablet Oral QHS  . phenytoin  200 mg Oral BID  . senna-docusate  2 tablet Oral Once  . sevelamer carbonate  0.8 g Oral TID WC   Continuous Infusions:  PRN Meds:.acetaminophen, diazepam, feeding supplement (NEPRO CARB STEADY), food thickener, heparin, HYDROmorphone, lidocaine-prilocaine, lidocaine-prilocaine, ondansetron (ZOFRAN) IV, ondansetron, sorbitol Allergies  Allergen Reactions  . Amoxicillin Hives   Vital Signs: BP 153/89  Pulse 87   Temp(Src) 97.2 F (36.2 C) (Oral)  Resp 17  Ht 5\' 6"  (1.676 m)  Wt 60.7 kg (133 lb 13.1 oz)  BMI 21.61 kg/m2  SpO2 97% Filed Weights   04/23/14 0605 04/23/14 0825 04/23/14 1150  Weight: 61.6 kg (135 lb 12.9 oz) 61.4 kg (135 lb 5.8 oz) 60.7 kg (133 lb 13.1 oz)   Physical Exam:  General appearance: Agitated, uncomfortable apearing Eyes: anicteric sclerae, moist conjunctivae; no lid-lag; PERRLA HENT: cannot focus, needs maximal cueing to swallow, having severe hiccups Neck: Trachea midline; FROM, supple, no thyromegaly or lymphadenopathy Lungs: +rhonchi CV: Tachycardic  Abdomen: Soft, non-tender; no masses or HSM Extremities: +edema Skin:dry Psych:unable to assess   Assessment/Prognosis:  [name] is chronically ill, he has little chance of significant improvement-family unaware largely that he could not make a full recovery at least not outwardly however they give a history of very poor QOL over the past year. HD is now virtually impossible with his inability to cooperate and comabative behavior when being stuck with needles. He is getting HD through a perm cath but this cannot be done outside the hospital. At best [name] would require facility level care for transport to HD outside of [place], medication management, and significant assistance with his basic independence. His family had been providing much of that care prior to admission but had noticed a decline evemn before his stroke.  They had appropriate grief today after truly hearing and processing the severity of [name] illness. I presented the option of Hospice Care and full comfort if [name] no longer is able or family elects to not receive HD.  They ask that his symptoms of pain, agitation and suffering be treated aggressively while they are making decisions about his care. They are considering the comfort care option strongly- especially since his condition appears much worse today.  Prognosis: If no HD <2 weeks, otherwise difficult  to determine   PPS 30%  Recommendations:  Goals:  I will re-meet with family tomorrow at 4PM-they need time to process information from today  Symptoms:  Hiccups: severe will give one time dose of baclofen and schedule some regaln  Agitation: Valium PRN  Pain: Oral Dilaudid preferred in ESRD  IV access difficult-maintain old IV if functional  He is not swallowing well-high risk for aspiration.  Time: 70 minutes Time: 12-110PM Greater than 50%  of this time was spent counseling and coordinating care related to the above assessment and plan.   Edsel Petrin, DO  04/24/2014, 1:08 PM  Please contact Palliative Medicine Team phone at [phone] for questions and concerns.

## 2014-04-24 NOTE — Progress Notes (Signed)
Patient ID: Matthew Beard, male   DOB: [birth year], 38 y.o.   MRN: [id number] Matthew Beard is a 38 y.o. male [birth year] [id number]  Subjective: No new problems noted. Pt not interactive, feeding self with no signs of distress/discomfort.  Objective: Vital signs in last 24 hours: Temp:  [97.2 F (36.2 C)-100.9 F (38.3 C)] 97.2 F (36.2 C) (06/06 0539) Pulse Rate:  [83-174] 87 (06/06 0539) Resp:  [17-19] 17 (06/06 0539) BP: (108-149)/(64-96) 149/81 mmHg (06/06 0539) SpO2:  [94 %-97 %] 97 % (06/06 0539) Weight:  [60.7 kg (133 lb 13.1 oz)] 60.7 kg (133 lb 13.1 oz) (06/05 1150) Weight change: -0.6 kg (-1 lb 5.2 oz) Last BM Date: 04/23/14  Intake/Output from previous day: 06/05 0701 - 06/06 0700 In: 600 [P.O.:600] Out: 800 [Urine:600]  Physical Exam General: He appears well-developed, nonverbal. Eating breakfast, aide at side observing same. Intermittent hiccups  Cardiovascular: Normal rate and regular rhythm. No edema Respiratory: Effort normal and breath sounds normal. No respiratory distress.  GI: Soft. Bowel sounds are normal. He exhibits no distension.  Neurological:  Patient is awake but withdrawn. Delayed processing. Doesn't consistently follow commands. Difficult to say if he favors one side over another, but feeding self.   Skin: Skin is warm and dry.  Psychiatric: Flat, poor initiation   Lab Results: BMET    Component Value Date/Time   NA 139 04/23/2014 0613   K 3.7 04/23/2014 0613   CL 97 04/23/2014 0613   CO2 22 04/23/2014 0613   GLUCOSE 95 04/23/2014 0613   BUN 57* 04/23/2014 0613   CREATININE 7.25* 04/23/2014 0613   CREATININE 2.41* 02/22/2012 1452   CALCIUM 8.6 04/23/2014 0613   GFRNONAA 8* 04/23/2014 0613   GFRAA 10* 04/23/2014 0613   CBC    Component Value Date/Time   WBC 4.9 04/23/2014 2035   RBC 3.40* 04/23/2014 2035   RBC 3.22* 08/19/2009 0430   HGB 10.2* 04/23/2014 2035   HCT 31.4* 04/23/2014 2035   PLT 114* 04/23/2014 2035   MCV 92.4 04/23/2014 2035   MCH 30.0 04/23/2014  2035   MCHC 32.5 04/23/2014 2035   RDW 16.3* 04/23/2014 2035   LYMPHSABS 1.1 04/05/2014 0500   MONOABS 0.6 04/05/2014 0500   EOSABS 0.1 04/05/2014 0500   BASOSABS 0.0 04/05/2014 0500   CBG's (last 3):   Recent Labs  04/22/14 0022  GLUCAP 110*   LFT's Lab Results  Component Value Date   ALT 19 03/29/2014   AST 20 03/29/2014   ALKPHOS 80 03/29/2014   BILITOT 0.5 03/29/2014    Studies/Results: No results found.  Medications:  I have reviewed the patient's current medications. Scheduled Medications: . carvedilol  25 mg Oral BID WC  . cloNIDine  0.1 mg Oral TID  . [START ON 04/27/2014] darbepoetin (ARANESP) injection - DIALYSIS  100 mcg Intravenous Q Tue-HD  . doxercalciferol  2 mcg Intravenous Q T,Th,Sa-HD  . feeding supplement (ENSURE)  1 Container Oral BID  . feeding supplement (NEPRO CARB STEADY)  237 mL Oral BID BM  . hydrALAZINE  100 mg Oral 3 times per day  . isosorbide mononitrate  30 mg Oral Daily  . levETIRAcetam  500 mg Oral BID  . lisinopril  20 mg Oral QHS  . minoxidil  2.5 mg Oral BID  . multivitamin  1 tablet Oral QHS  . phenytoin  200 mg Oral BID  . sevelamer carbonate  800 mg Oral TID WC   PRN Medications: acetaminophen, chlorproMAZINE, feeding supplement (  NEPRO CARB STEADY), food thickener, heparin, lidocaine-prilocaine, lidocaine-prilocaine, ondansetron (ZOFRAN) IV, ondansetron, sorbitol  Assessment/Plan: Active Problems:   ICH (intracerebral hemorrhage) . Functional deficits secondary to ICH  2. DVT Prophylaxis/Anticoagulation: SCDs. Monitor for any signs of DVT  3. Pain Management: Tylenol as needed  4. Seizure disorder. Keppra 500 mg every 12 hours, Dilantin 200 mg twice a day. Monitor for any signs of seizure  5. Neuropsych: This patient is not capable of making decisions on his own behalf.  6. End-stage renal disease with hemodialysis. Continue dialysis as per renal services. PermCath placed a 04/20/2014. Therapies to scheduled prior to HD on HD days.  7.  Dysphagia. Dysphagia 3 nectar thick liquids.  8. Hypertension. Norvasc, Coreg, clonidine, hydralazine, Imdur, lisinopril and Minoxidil.  -bp/volume mgt per nephrology  9. Diastolic congestive heart failure/NICM. Monitor for any signs of fluid overload. Continue Lasix 80 mg twice a day  10. Chronic anemia. Aranesp. Followup CBC with dialysis  11. Mood. Seroquel 25 mg twice a day.  12. Hiccups. Limited thorazine prn   Length of stay, days: 3    Alane Hanssen A. Felicity Coyer, MD 04/24/2014, 8:28 AM

## 2014-04-24 NOTE — Progress Notes (Signed)
Speech Language Pathology Daily Session Note  Patient Details  Name: Matthew Beard MRN: [id number] Date of Birth: 1976-03-18  Today's Date: 04/24/2014 Time: 1655-3748 Time Calculation (min): 31 min  Short Term Goals: Week 1: SLP Short Term Goal 1 (Week 1): Pt will tolerate dys 3 textures and nectar thick liquids with mod assist for swallowing precautions with no overt s/s of aspiration  SLP Short Term Goal 2 (Week 1): Pt will tolerate trials of upgraded textures and consistencies with no overt s/s of aspiration for ongoing assessment for diet advancement SLP Short Term Goal 3 (Week 1): Pt will communicate basic needs/wants via any modality (pointing, gestures, verbalizations, vocalizations, etc.) for 75% accuracy with mod-max assist  SLP Short Term Goal 4 (Week 1): Pt will sustain attention to a basic task for 3-5 minutes with mod-max assist for redirection  SLP Short Term Goal 5 (Week 1): Pt will improve basic problem solving for functional ADLs and self care tasks for 75% accuracy and mod assist.   SLP Short Term Goal 6 (Week 1): Pt will improve comprehension for basic yes/no questions and 1-2 step directions for 75% accuracy with mod-max assist.   Skilled Therapeutic Interventions: Therapeutic intervention complete for cognitive skills retraining and dysphagia. Patient was not able to consume DIII diet and downgraded to DI.  One bolus was given, and oral cavity had to be suctioned, and he was not responding appropriately and trying to swallow cold consistency offered.  He was not able to follow any commands, despite stimulation of all senses.  He did moan x3 with tactile stimulation to face.  Nurse made aware of importance not to attempt feeding when he is not alert.  Alternative means of nutrition was discussed; however, nurse felt he could consume nutrition when more alert and with maximum verbal/tactile cues.   FIM:  Comprehension Comprehension: 1-Understands basic less than 25% of the  time/requires cueing 75% of the time Expression Expression Mode: Nonverbal Expression: 1-Expresses basis less than 25% of the time/requires cueing greater than 75% of the time. Problem Solving Problem Solving: 1-Solves basic less than 25% of the time - needs direction nearly all the time or does not effectively solve problems and may need a restraint for safety Memory Memory: 1-Recognizes or recalls less than 25% of the time/requires cueing greater than 75% of the time FIM - Eating Eating Activity: 1: Helper feeds patient;5: Needs verbal cues/supervision;6: More than reasonable amount of time;6: Modified consistency diet: (comment)  Pain    Therapy/Group: Individual Therapy  Caramia Boutin L Essynce Munsch 04/24/2014, 4:24 PM

## 2014-04-25 ENCOUNTER — Inpatient Hospital Stay (HOSPITAL_COMMUNITY): Payer: Self-pay

## 2014-04-25 ENCOUNTER — Inpatient Hospital Stay (HOSPITAL_COMMUNITY): Payer: Medicaid Other | Admitting: Physical Therapy

## 2014-04-25 ENCOUNTER — Encounter (HOSPITAL_COMMUNITY): Payer: Self-pay

## 2014-04-25 DIAGNOSIS — I10 Essential (primary) hypertension: Secondary | ICD-10-CM

## 2014-04-25 DIAGNOSIS — R1310 Dysphagia, unspecified: Secondary | ICD-10-CM

## 2014-04-25 DIAGNOSIS — N186 End stage renal disease: Secondary | ICD-10-CM

## 2014-04-25 DIAGNOSIS — D649 Anemia, unspecified: Secondary | ICD-10-CM

## 2014-04-25 DIAGNOSIS — R569 Unspecified convulsions: Secondary | ICD-10-CM

## 2014-04-25 LAB — URINALYSIS W MICROSCOPIC (NOT AT ARMC)
Glucose, UA: NEGATIVE mg/dL
HGB URINE DIPSTICK: NEGATIVE
Ketones, ur: 15 mg/dL — AB
Leukocytes, UA: NEGATIVE
NITRITE: NEGATIVE
PROTEIN: 100 mg/dL — AB
Specific Gravity, Urine: 1.018 (ref 1.005–1.030)
Urobilinogen, UA: 0.2 mg/dL (ref 0.0–1.0)
pH: 6 (ref 5.0–8.0)

## 2014-04-25 LAB — PHENYTOIN LEVEL, FREE AND TOTAL
PHENYTOIN FREE: 1.2 mg/L (ref 1.0–2.0)
Phenytoin Bound: 7.2 mg/L
Phenytoin, Total: 8.4 mg/L — ABNORMAL LOW (ref 10.0–20.0)

## 2014-04-25 MED ORDER — SCOPOLAMINE 1 MG/3DAYS TD PT72
1.0000 | MEDICATED_PATCH | TRANSDERMAL | Status: DC
  Administered 2014-04-25: 1.5 mg via TRANSDERMAL
  Filled 2014-04-25: qty 1

## 2014-04-25 MED ORDER — BACLOFEN 5 MG HALF TABLET
5.0000 mg | ORAL_TABLET | Freq: Four times a day (QID) | ORAL | Status: DC | PRN
  Filled 2014-04-25: qty 1

## 2014-04-25 MED ORDER — DIAZEPAM 5 MG/ML IJ SOLN: 5.0000 mg | INTRAMUSCULAR | Status: DC | PRN

## 2014-04-25 MED ORDER — SODIUM CHLORIDE 0.9 % IV SOLN
12.5000 mg | Freq: Once | INTRAVENOUS | Status: AC
  Administered 2014-04-25: 12.5 mg via INTRAVENOUS
  Filled 2014-04-25: qty 0.5

## 2014-04-25 MED ORDER — ACETAMINOPHEN 10 MG/ML IV SOLN
1000.0000 mg | Freq: Four times a day (QID) | INTRAVENOUS | Status: DC | PRN
  Administered 2014-04-25: 1000 mg via INTRAVENOUS
  Filled 2014-04-25 (×3): qty 100

## 2014-04-25 NOTE — Progress Notes (Signed)
Physical Therapy Session Note  Patient Details  Name: Matthew Beard MRN: [id number] Date of Birth: Dec 05, 1975  Today's Date: 04/25/2014 Time: 5784-6962 Time Calculation (min): 15 min  Short Term Goals: Week 1:  PT Short Term Goal 1 (Week 1): Pt will perform bed mobility with min A using bed rail with mod cueing for initiation. PT Short Term Goal 2 (Week 1): Pt will consistently perform sit<>stand with min A and mod cueing for initiation. PT Short Term Goal 3 (Week 1): Pt will demonstrate focused attention to mobility task for >15 seconds in controlled environment with max cueing. PT Short Term Goal 4 (Week 1): Pt will perform gait x25' in controlled environment with min A.  Skilled Therapeutic Interventions/Progress Updates:  Pt was seen bedside in the pm. Pt unable to maintain arousal to actively participate with therapy. Performed passive ROM to LEs unable to get pt to actively engaged in therapy session. Pt moved up in bed with total A.   Therapy Documentation Precautions:  Precautions Precautions: Fall Precaution Comments: seizure Restrictions Weight Bearing Restrictions: No General: Amount of Missed PT Time (min): 15 Minutes Missed Time Reason: Other (comment) (unable to maintain arousal to activily participate. )  Pain: No signs of pain noted.   Locomotion : Ambulation Ambulation/Gait Assistance: 3: Mod assist   See FIM for current functional status  Therapy/Group: Individual Therapy  Rayford Halsted 04/25/2014, 2:59 PM

## 2014-04-25 NOTE — Progress Notes (Signed)
Patient ID: Matthew AustriaDamon N Paszkiewicz, male   DOB: 05-09-1976, 38 y.o.   MRN: [id number] Matthew Beard is a 38 y.o. male 05-09-1976 [id number]  Subjective: No new problems noted. Pt not interactive, appears to be watching TV. no signs of distress/discomfort.  Objective: Vital signs in last 24 hours: Temp:  [98.9 F (37.2 C)-100.9 F (38.3 C)] 99.1 F (37.3 C) (06/07 0500) Pulse Rate:  [93-100] 98 (06/07 0500) Resp:  [19] 19 (06/07 0500) BP: (112-153)/(54-92) 131/78 mmHg (06/07 0500) SpO2:  [96 %-98 %] 96 % (06/07 0500) Weight:  [59.6 kg (131 lb 6.3 oz)] 59.6 kg (131 lb 6.3 oz) (06/07 0640) Weight change: -1.8 kg (-3 lb 15.5 oz) Last BM Date: 04/23/14  Intake/Output from previous day: 06/06 0701 - 06/07 0700 In: 0  Out: 400 [Urine:400]  Physical Exam General: He appears well-developed, nonverbal. Supine in bed. Near continuous hiccups  Cardiovascular: Normal rate and regular rhythm. No edema Respiratory: Effort normal and breath sounds normal. No respiratory distress.  GI: Soft. Bowel sounds are normal. He exhibits no distension.  Neurological:  Patient is awake but withdrawn. Delayed processing. Doesn't consistently follow commands. Difficult to say if he favors one side over another.   Skin: Skin is warm and dry.  Psychiatric: Flat, poor initiation   Lab Results: BMET    Component Value Date/Time   NA 139 04/23/2014 0613   K 3.7 04/23/2014 0613   CL 97 04/23/2014 0613   CO2 22 04/23/2014 0613   GLUCOSE 95 04/23/2014 0613   BUN 57* 04/23/2014 0613   CREATININE 7.25* 04/23/2014 0613   CREATININE 2.41* 02/22/2012 1452   CALCIUM 8.6 04/23/2014 0613   GFRNONAA 8* 04/23/2014 0613   GFRAA 10* 04/23/2014 0613   CBC    Component Value Date/Time   WBC 4.9 04/23/2014 2035   RBC 3.40* 04/23/2014 2035   RBC 3.22* 08/19/2009 0430   HGB 10.2* 04/23/2014 2035   HCT 31.4* 04/23/2014 2035   PLT 114* 04/23/2014 2035   MCV 92.4 04/23/2014 2035   MCH 30.0 04/23/2014 2035   MCHC 32.5 04/23/2014 2035   RDW 16.3* 04/23/2014  2035   LYMPHSABS 1.1 04/05/2014 0500   MONOABS 0.6 04/05/2014 0500   EOSABS 0.1 04/05/2014 0500   BASOSABS 0.0 04/05/2014 0500   CBG's (last 3):  No results found for this basename: GLUCAP,  in the last 72 hours LFT's Lab Results  Component Value Date   ALT 19 03/29/2014   AST 20 03/29/2014   ALKPHOS 80 03/29/2014   BILITOT 0.5 03/29/2014    Studies/Results: No results found.  Medications:  I have reviewed the patient's current medications. Scheduled Medications: . carvedilol  25 mg Oral BID WC  . cloNIDine  0.2 mg Transdermal Weekly  . [START ON 04/27/2014] darbepoetin (ARANESP) injection - DIALYSIS  100 mcg Intravenous Q Tue-HD  . [START ON 04/26/2014] doxercalciferol  2 mcg Intravenous Q M,W,F-HD  . feeding supplement (ENSURE)  1 Container Oral BID  . feeding supplement (NEPRO CARB STEADY)  237 mL Oral BID BM  . hydrALAZINE  100 mg Oral 3 times per day  . isosorbide mononitrate  30 mg Oral Daily  . levETIRAcetam  500 mg Oral BID  . lisinopril  20 mg Oral QHS  . metoCLOPramide  10 mg Oral BID  . minoxidil  2.5 mg Oral BID  . multivitamin  1 tablet Oral QHS  . phenytoin  200 mg Oral BID  . sevelamer carbonate  0.8 g Oral TID  WC   PRN Medications: acetaminophen, diazepam, feeding supplement (NEPRO CARB STEADY), food thickener, heparin, HYDROmorphone, lidocaine-prilocaine, lidocaine-prilocaine, ondansetron (ZOFRAN) IV, ondansetron, sorbitol  Assessment/Plan: Active Problems:   ICH (intracerebral hemorrhage) . Functional deficits secondary to ICH  2. DVT Prophylaxis/Anticoagulation: SCDs. Monitor for any signs of DVT  3. Pain Management: Tylenol as needed  4. Seizure disorder. Keppra 500 mg every 12 hours, Dilantin 200 mg twice a day. Monitor for any signs of seizure  5. Neuropsych: This patient is not capable of making decisions on his own behalf.  6. End-stage renal disease with hemodialysis. Continue dialysis as per renal services. PermCath placed a 04/20/2014. Therapies to  scheduled prior to HD on HD days.  7. Dysphagia. Dysphagia 3 nectar thick liquids.  8. Hypertension. Norvasc, Coreg, clonidine, hydralazine, Imdur, lisinopril and Minoxidil.  -bp/volume mgt per nephrology  9. Diastolic congestive heart failure/NICM. Monitor for any signs of fluid overload. Continue Lasix 80 mg twice a day  10. Chronic anemia. Aranesp. Followup CBC with dialysis  11. Mood. Seroquel 25 mg twice a day.  12. Hiccups. Limited thorazine prn   Length of stay, days: 4    Clema Skousen A. Felicity Coyer, MD 04/25/2014, 8:53 AM

## 2014-04-25 NOTE — Progress Notes (Signed)
Above noted, no further dialysis, family has decided on comfort care.  Will sign off. Appreciate efforts of rehab team and palliatve care.  Vinson Moselle MD (pgr) [phone]    [phone] 04/25/2014, 9:53 PM

## 2014-04-25 NOTE — Progress Notes (Signed)
Physical Therapy Session Note  Patient Details  Name: Matthew Beard MRN: [id number] Date of Birth: 16-May-1976  Today's Date: 04/25/2014 Time: 1001-1056 Time Calculation (min): 55 min  Short Term Goals: Week 1:  PT Short Term Goal 1 (Week 1): Pt will perform bed mobility with min A using bed rail with mod cueing for initiation. PT Short Term Goal 2 (Week 1): Pt will consistently perform sit<>stand with min A and mod cueing for initiation. PT Short Term Goal 3 (Week 1): Pt will demonstrate focused attention to mobility task for >15 seconds in controlled environment with max cueing. PT Short Term Goal 4 (Week 1): Pt will perform gait x25' in controlled environment with min A.  Skilled Therapeutic Interventions/Progress Updates:  Pt was seen bedside in the am sitting up in w/c. Pt transported to rehab gym. Pt will make eye contact however inconsistent. Pt will assist with completing a task once initiated by therapist. Pt transferred sit to stand multiple times with mod A and verbal and tactile cues. Pt ambulated with rolling walker and min to mod A for 80 feet x 3. Pt ambulated with HHA for 80 feet with mod A and verbal cues. Pt returned to room. Pt transferred w/c to edge of bed with mod A. Pt transferred edge of bed to supine with mod A.   Therapy Documentation Precautions:  Precautions Precautions: Fall Precaution Comments: seizure Restrictions Weight Bearing Restrictions: No General:   Pain: No signs of pain noted during treatment.    Locomotion : Ambulation Ambulation/Gait Assistance: 3: Mod assist   See FIM for current functional status  Therapy/Group: Individual Therapy  Rayford Halsted 04/25/2014, 12:36 PM

## 2014-04-25 NOTE — Progress Notes (Signed)
Occupational Therapy Session Note  Patient Details  Name: Matthew Beard MRN: [id number] Date of Birth: 20-Mar-1976  Today's Date: 04/25/2014  Session 1 Time: 0800-0856 Time Calculation (min): 56 min  Short Term Goals: Week 1:  OT Short Term Goal 1 (Week 1): Patient will maintain arousal sufficient to feed himself 1/3 of meal with minimal assistance OT Short Term Goal 2 (Week 1): Patient will focus attention for 1 minute to complete hygiene task(s) with min assist OT Short Term Goal 3 (Week 1): Patient will transfer to toilet with min assist OT Short Term Goal 4 (Week 1): Patient will indicate 1 personal need and not resist assistance related to this need e.g. toileting OT Short Term Goal 5 (Week 1): Patient will don lower body clothing with max assist  Skilled Therapeutic Interventions/Progress Updates:    Pt resting in bed upon arrival.  Pt non verbal throughout session with no focused eye contact.  Pt required max multimodal cues for supine->sit EOB in preparation for transfer to w/c for BADLs at sink.  Pt required physical assist to initiate sit->stand from EOB but once initiated patient was able to complete task with min A.  Pt required max multimodal cues and extra time to perform step-stand pivot to w/c.  Pt required manual facilitation (tactile cues for hip flexion) to complete stand->sit.  Pt was total A for bathing and dressing tasks at sink.  Therapist initiated [facility] assist for bathing tasks but patient would let wash cloth drop and did not complete tasks once initiated by therapist.  Transitioned to ADL apartment to engage patient in additional self care tasks in a more familiar environment.  Pt did not follow any commands throughout session.  Focus on BADLs, following one step commands, task initiation, attention to task, making eye contact, transfers, sit<>stand, and activity tolerance.  Pt remained in w/c with QRB in place and sitting at nurses station.  Therapy  Documentation Precautions:  Precautions Precautions: Fall Precaution Comments: seizure Restrictions Weight Bearing Restrictions: No   Pain: Pain Assessment Pain Assessment: Faces Pain Score: 0-No pain  See FIM for current functional status  Therapy/Group: Individual Therapy  Session 2 Time: 7741-4239 (pt missed 23 mins OT services-see below) Pt exhibited no s/s distress/pain Individual Therapy  Attempted to engage patient in bed mobility and sitting EOB.  Pt's eyes closed upon arrival.  Attempted several multimodal cues to arouse patient but patient did not open eyes.  Attempted to engaged patient in rolling in bed but patient only able to perform automatic responses.  Pt had hiccups throughout session.  RN aware of patient's status.   Liberty Handy Marigrace Mccole 04/25/2014, 9:01 AM

## 2014-04-25 NOTE — Progress Notes (Signed)
Palliative Medicine Team Consult Note  Met with Matthew Beard's family including both of his brothers and Matthew Beard his girlfriend and about 45 other aunts, uncles and cousins to re-discuss goals of care. Matthew Beard's primary decision makers are his two brothers, he lived with Matthew Beard prior to admission.  Summary of Discussion:  1. DNR 2. Family report Matthew Beard would never want to live like this-they think he is tired of the needles and that he has been severely depressed for a long time. HD has been difficult for months. 3. At this time they want to focus on full comfort care and discontinue HD. 4. I have recommended a Hospice Facility for his care. 5. They understand his prognosis is <2 weeks 6. Pain and symptom control are primary goals.  They would like for Matthew Beard to die with dignity and have some peace.  Recs: 1. Will schedule IV Dilaudid q6 and q2 prn for pain and or dyspnea 2. Thorazine for his hiccups 3. Scop patch for his secretions. 4. Valium for agitation  Can use his HD cath if needed for IV infusion for comfort care-may need to transfer off rehab until hospice bed available.  Matthew Hacker, DO Palliative Medicine

## 2014-04-25 NOTE — Significant Event (Addendum)
Rapid Response Event Note  Overview:  Called to see patient for declining status. Reviewed chart. Discussed case with nurse.    Initial Focused Assessment: Patient is very drowsy; chronic hiccups create an irregular respiratory pattern. VSS. Skin warm and dry. No overt signs of instability. MD has been consulted by RN and is aware of situation.  Agree with nursing hesitation to provide oral meds or fluids because of risk for aspiration. Suction is present in room.  Care consult this afternoon to discuss options is scheduled with treatment team and family in light of test results, response to hemodialysis treatments, mentation and ability to participate in therapies.   Interventions: Discussed care options and decision trees after conclusions of care consult this afternoon. Will continue to be available to nursing staff for consultation or in emergency situation should that arise. Will continue to monitor and assist with changes in treatment plan.  Event Summary:   at      at          Kristine Linea

## 2014-04-25 NOTE — Progress Notes (Signed)
Patient continues to be nonverbal and does not follow most commands. Patient opens eyes and tracks at times. Patient continues to have chronic hiccups. This seems to create a safety issue with patient eating and taking meds. Patient incontinent urine and not voiding often-attempting to obtain sample. MD Felicity Coyer notified of issues. Awaiting MD Goldings/family decision after conference this afternoon. Continue to monitor

## 2014-04-26 ENCOUNTER — Inpatient Hospital Stay (HOSPITAL_COMMUNITY): Payer: Self-pay | Admitting: Speech Pathology

## 2014-04-26 ENCOUNTER — Inpatient Hospital Stay (HOSPITAL_COMMUNITY): Payer: Self-pay

## 2014-04-26 ENCOUNTER — Encounter (HOSPITAL_COMMUNITY): Payer: Self-pay

## 2014-04-26 MED ORDER — HYDROMORPHONE HCL PF 1 MG/ML IJ SOLN: 1.0000 mg | INTRAMUSCULAR | Status: DC | PRN

## 2014-04-26 NOTE — Progress Notes (Signed)
Chaplain responded to spiritual care consult. No family present. RN stated that pt is not responsive. Chaplain available for support if needed.

## 2014-04-26 NOTE — Plan of Care (Signed)
Problem: RH Cognition - SLP Goal: RH LTG Patient will demonstrate orientation with cues LTG: Patient will demonstrate orientation to person/place/time/situation with cues (SLP)  Outcome: Not Met (add Reason) Pt with significant decline in function and is now comfort care per family   Problem: RH Comprehension Communication Goal: LTG Patient will comprehend basic/complex auditory (SLP) LTG: Patient will comprehend basic/complex auditory information with cues (SLP).  Outcome: Not Met (add Reason) Pt with significant decline in function and is now comfort care per family   Problem: RH Expression Communication Goal: LTG Patient will express needs/wants via multi-modal(SLP) LTG: Patient will express needs/wants via multi-modal communication (gestures/written, etc) with cues (SLP)  Outcome: Not Met (add Reason) Pt with significant decline in function and is now comfort care per family  Goal: LTG Patient will verbally express basic/complex needs (SLP) LTG: Patient will verbally express basic/complex needs, wants or ideas with cues (SLP)  Outcome: Not Met (add Reason) Pt with significant decline in function and is now comfort care per family  Goal: LTG Patient will increase word finding of common (SLP) LTG: Patient will increase word finding of common objects/daily info/abstract thoughts with cues using compensatory strategies (SLP).  Outcome: Not Met (add Reason) Pt with significant decline in function and is now comfort care per family   Problem: RH Problem Solving Goal: LTG Patient will demonstrate problem solving for (SLP) LTG: Patient will demonstrate problem solving for basic/complex daily situations with cues (SLP)  Outcome: Not Met (add Reason) Pt with significant decline in function and is now comfort care per family   Problem: RH Memory Goal: LTG Patient will follow step by step directions w/cues (SLP) LTG: Patient will follow step by step directions with cues (SLP).  Outcome:  Not Met (add Reason) Pt with significant decline in function and is now comfort care per family     Goal: LTG Patient will demonstrate ability for day to day (SLP) LTG: Patient will demonstrate ability for day to day recall/carryover during cognitive/linguistic activities with assist (SLP)  Outcome: Not Met (add Reason) Pt with significant decline in function and is now comfort care per family  Goal: LTG Patient will use memory compensatory aids to (SLP) LTG: Patient will use memory compensatory aids to recall biographical/new, daily complex information with cues (SLP)  Outcome: Not Met (add Reason) Pt with significant decline in function and is now comfort care per family   Problem: RH Attention Goal: LTG Patient will demonstrate focused/sustained (SLP) LTG: Patient will demonstrate focused/sustained/selective/alternating/divided attention during cognitive/linguistic activities in specific environment with assist for # of minutes (SLP)  Outcome: Not Met (add Reason) Pt with significant decline in function and is now comfort care per family   Problem: RH Awareness Goal: LTG: Patient will demonstrate intellectual/emergent (SLP) LTG: Patient will demonstrate intellectual/emergent/anticipatory awareness with assist during a cognitive/linguistic activity (SLP)  Outcome: Not Met (add Reason) Pt with significant decline in function and is now comfort care per family

## 2014-04-26 NOTE — Plan of Care (Signed)
Problem: RH BOWEL ELIMINATION Goal: RH STG MANAGE BOWEL WITH ASSISTANCE STG Manage Bowel with mod Assistance.  Outcome: Not Progressing LBM 03/26/14

## 2014-04-26 NOTE — Discharge Summary (Signed)
Matthew Beard, Matthew Beard               ACCOUNT NO.:  [id number]  MEDICAL RECORD NO.:  [id number]  LOCATION:  [id number]                        FACILITY:  [facility]  PHYSICIAN:  Matthew Beard, BeardDATE OF BIRTH:  01/29/1976  DATE OF ADMISSION:  04/21/2014 DATE OF DISCHARGE:  04/26/2014                              DISCHARGE SUMMARY   DISCHARGE DIAGNOSES: 1. Functional deficits secondary to ICH-failure to thrive. 2. Sequential compression device for deep vein thrombosis prophylaxis. 3. Seizure disorder. 4. End-stage renal disease. 5. Dysphagia. 6. Hypertension. 7. Diastolic congestive heart failure. 8. Chronic anemia. 9. Hiccups.  HISTORY OF PRESENT ILLNESS:  This is a 38 year old right-handed male history of end-stage renal disease new dialysis, nonischemic cardiomyopathy as well as hypertension, congestive heart failure, and chronic lacunar infarcts and medical noncompliance.  Admitted Mar 30, 2014, after a questionable syncopal episode with fall versus seizure. He did require intubation.  He was loaded with Keppra as well as the addition of Dilantin.  Cranial CT scan showed focus of parenchymal hemorrhage at the junction of the left temporal and left parietal lobe. CT angio of the head with no intracranial aneurysm identified, no evidence of significant proximal stenosis.  EEG consistent with generalized, nonspecific cerebral dysfunction.  He was extubated on Apr 02, 2014.  Renal Services follow up, hemodialysis as directed, creatinine 7.37 with permanent catheter placed on April 20, 2014. Followup MRI with noted widespread micro bleeds.  Chronic corpus colostrum hemorrhagic infarct as well as multiple subcentimeter acute infarcts throughout the supra tentorial region.  Maintained on mechanical soft, nectar thick liquid diet.  Bouts of agitation, restlessness, maintained on Seroquel.  The patient was admitted for a comprehensive rehab program.  PAST MEDICAL HISTORY:  See discharge  diagnoses.  SOCIAL HISTORY:  Lives with spouse.  FUNCTIONAL HISTORY:  Prior to admission, needed assistance for level of independence.  Functional status upon admission to rehab services was total assist.  PHYSICAL EXAMINATION:  VITAL SIGNS:  Blood pressure 160/99, pulse 74, temperature 98.2, respirations 18. GENERAL:  This was a lethargic male, difficult to arouse.  He did answer some yes and no questions, but inconsistent.  Difficult to keep attention. LUNGS:  Decreased breath sounds.  Clear to auscultation. CARDIAC:  Regular rate and rhythm. ABDOMEN:  Soft, nontender.  Good bowel sounds.  REHABILITATION HOSPITAL COURSE:  The patient was admitted to inpatient rehab services with therapies initiated on a 3-hour daily basis consisting of physical therapy, occupational therapy, speech therapy, and rehabilitation nursing.  The following issues were addressed during the patient's rehabilitation stay.  Pertaining to Mr. Boyea functional deficits secondary to ICH, failure to thrive, limited participation with therapies followed by Renal Services with hemodialysis on going.  Continued on seizure medication of Keppra, Dilantin for seizure disorder.  Keppra has been discontinued due to some increased lethargy.  The patient also had been on Seroquel for agitation and restlessness, also discontinued secondary to lethargy.  He continued on mechanical soft, nectar thick liquid diet with limited p.o. intake. The patient's progress on the rehab remained minimal.  Blood pressures continued to be monitored.  Increased lethargy noted.  Rapid Response consulted on April 25, 2014, secondary to declining status, the patient very  drowsy.  Palliative Care had been consulted for goals of care and discussed at length with family.  Hemodialysis was discontinued. Recommendations made for hospice facility with bed becoming available on April 26, 2014.  All medication [facility]  DISCHARGE MEDICATIONS: 1. Baclofen 5 mg every 6 hours as needed for hiccups. 2. Coreg 25 mg p.o. b.i.d. 3. Clonidine patch 0.2 mg, change every 7 days. 4. Hydralazine 100 mg p.o. every 8 hours. 5. Dilaudid 1 mg intravenous every 2 hours as needed for moderate to     severe pain. 6. Imdur were 30 mg p.o. daily. 7. Keppra solution 500 mg p.o. b.i.d. 8. Lisinopril 20 mg p.o. daily. 9. Minoxidil 2.5 mg p.o. b.i.d. 10.Scopolamine patch, change every 72 hours.  DIET:  His diet was now on dysphagia 1 nectar thick liquid.     Matthew Dollaraniel Beard, P.A.   ______________________________ Matthew OysterZachary T. Beard, M.D.    DA/MEDQ  D:  04/26/2014  T:  04/26/2014  Job:  [id number]  cc:   Matthew Beard, M.D.

## 2014-04-26 NOTE — Progress Notes (Signed)
Occupational Therapy Discharge Summary  Patient Details  Name: Matthew Beard MRN: [id number] Date of Birth: Jan 17, 1976  Today's Date: 04/26/2014    Patient has met 0 of 12 long term goals due to worsening medical status.  Pt made limited and inconsistent progress with BADLs during this admission.  OT services discontinued 6/7 per MD orders after consultation with family members.  Pt to d/c to [facility] for hospice services. Patient to discharge at [facility] Max Assist level.  Patient's care partner requires assistance to provide the necessary physical and cognitive assistance at discharge.    Reasons goals not met: Decline in medical stability   Equipment: No equipment provided  Reasons for discharge: change in medical status and lack of progress toward goals  Patient/family agrees with progress made and goals achieved: Yes agreeable to hospice discharge     See FIM for current functional status  Leroy Libman 04/26/2014, 12:04 PM

## 2014-04-26 NOTE — Discharge Summary (Signed)
Discharge summary job # [id number]

## 2014-04-26 NOTE — Progress Notes (Signed)
[place] PHYSICAL MEDICINE & REHABILITATION     PROGRESS NOTE    Subjective/Complaints: Lying in bed. Slow to arouse.  ROS limited by poor cognitive status  Objective: Vital Signs: Blood pressure 141/76, pulse 87, temperature 97 F (36.1 C), temperature source Oral, resp. rate 18, height 5\' 6"  (1.676 m), weight 59.6 kg (131 lb 6.3 oz), SpO2 96.00%. No results found.  Recent Labs  04/23/14 2035  WBC 4.9  HGB 10.2*  HCT 31.4*  PLT 114*   No results found for this basename: NA, K, CL, CO, GLUCOSE, BUN, CREATININE, CALCIUM,  in the last 72 hours CBG (last 3)  No results found for this basename: GLUCAP,  in the last 72 hours  Wt Readings from Last 3 Encounters:  04/25/14 59.6 kg (131 lb 6.3 oz)  04/20/14 63.7 kg (140 lb 6.9 oz)  03/15/14 81.012 kg (178 lb 9.6 oz)    Physical Exam:  Constitutional: He appears well-developed. Intermittent hiccops  HENT: oral mucosa pink  Head: Normocephalic.  Eyes:  Pupils round reactive to light  Neck: Normal range of motion. Neck supple. No thyromegaly present.  Cardiovascular: Normal rate and regular rhythm.  Respiratory: Effort normal and breath sounds normal. No respiratory distress.  GI: Soft. Bowel sounds are normal. He exhibits no distension.  Neurological:  Patient is awake but withdrawn. Delayed processing. Doesn't consistently follow commands.  Moves all 4's with significant delay today. Difficult to say if he favors one side over another. Senses pain in all 4's.  Skin: Skin is warm and dry.  Psychiatric:  Flat, poor initiation   Assessment/Plan: 1. Functional deficits secondary to ICH which require 3+ hours per day of interdisciplinary therapy in a comprehensive inpatient rehab setting. Physiatrist is providing close team supervision and 24 hour management of active medical problems listed below. Physiatrist and rehab team continue to assess barriers to discharge/monitor patient progress toward functional and medical  goals.  Family has spoken to hospice. Given circumstances, they have decided to withdraw care. Will now need hospice placement. DC therapies.   FIM: FIM - Bathing Bathing: 1: Total-Patient completes 0-2 of 10 parts or less than 25%  FIM - Upper Body Dressing/Undressing Upper body dressing/undressing: 1: Total-Patient completed less than 25% of tasks FIM - Lower Body Dressing/Undressing Lower body dressing/undressing: 1: Total-Patient completed less than 25% of tasks  FIM - Toileting Toileting: 0: Activity did not occur  FIM - Archivist Transfers: 0-Activity did not occur  FIM - Banker Devices: Bed rails Bed/Chair Transfer: 2: Supine > Sit: Max A (lifting assist/Pt. 25-49%);3: Bed > Chair or W/C: Mod A (lift or lower assist)  FIM - Locomotion: Wheelchair Locomotion: Wheelchair: 1: Total Assistance/staff pushes wheelchair (Pt<25%) FIM - Locomotion: Ambulation Locomotion: Ambulation Assistive Devices: Designer, industrial/product Ambulation/Gait Assistance: 3: Mod assist Locomotion: Ambulation: 2: Travels 50 - 149 ft with moderate assistance (Pt: 50 - 74%)  Comprehension Comprehension Mode: Auditory Comprehension: 1-Understands basic less than 25% of the time/requires cueing 75% of the time  Expression Expression Mode: Verbal Expression: 1-Expresses basis less than 25% of the time/requires cueing greater than 75% of the time.  Social Interaction Social Interaction: 1-Interacts appropriately less than 25% of the time. May be withdrawn or combative.  Problem Solving Problem Solving: 1-Solves basic less than 25% of the time - needs direction nearly all the time or does not effectively solve problems and may need a restraint for safety  Memory Memory: 1-Recognizes or recalls less than 25%  of the time/requires cueing greater than 75% of the time  Medical Problem List and Plan:  1. Functional deficits secondary to ICH  2. DVT  Prophylaxis/Anticoagulation: SCDs. Monitor for any signs of DVT  3. Pain Management: Tylenol as needed  4. Seizure disorder. Keppra 500 mg every 12 hours, Dilantin 200 mg twice a day. Monitor for any signs of seizure  5. Neuropsych: This patient is not capable of making decisions on his own behalf.  6. End-stage renal disease with hemodialysis. HD stopped per above.  7. Dysphagia. Dysphagia 3 nectar thick liquids.   8. Hypertension. Norvasc 10 mg daily, Coreg 25 mg twice a day, clonidine 0.3 mg 3 times a day, hydralazine 100 mg every 8 hours, Imdur 30 mg daily and lisinopril 20 mg QHS,Minoxidil 2.5 mg BID.     9. Diastolic congestive heart failure/NICM. Monitor for any signs of fluid overload. Continue Lasix 80 mg twice a day  10. Chronic anemia. Aranesp. Followup CBC with dialysis  11. Mood. ---egosupport as appropriate.   12. Hiccups.  thorazine prn  LOS (Days) 5 A FACE TO FACE EVALUATION WAS PERFORMED  Ranelle OysterZachary T Cantrell Beard 04/26/2014 7:56 AM

## 2014-04-26 NOTE — Progress Notes (Addendum)
Speech Language Pathology Discharge Summary  Patient Details  Name: Matthew Beard MRN: [id number] Date of Birth: 14-Oct-1976  Today's Date: 04/26/2014   Patient has met  0 of 14 long term goals.  Patient to discharge at overall Max-Total assist level.  Reasons goals not met: change in pt medical status and function    Clinical Impression/Discharge Summary: Pt made limited and inconsistent progress while on CIR due to decline in functional status and met 0 out of 14 long term goals.  Per chart review, pt's family wishes pt to be placed on comfort care with no further therapy from PT/OT/SLP.  Pt is to discharge at an overall max-total assist level for swallowing safety due to cognitive impairments and generalized weakness and max-total assist for functional communication due to significant global aphasia.  Pt's diet was downgraded from dys 3/nectar thick to dys 1/nectar thick due to fluctuating mentation and lethargy which placed pt an increased risk of aspiration. No further SLP needs are indicated at this time due to placement in palliative care/hospice facility.         Care Partner:  Caregiver Able to Provide Assistance: Other (comment) (palliative care)   Recommendation:  24 hour supervision/assistance   Equipment:  None recommended by SLP  Reasons for discharge: Change in medical status   Patient/Family Agrees with Progress Made and Goals Achieved:  (pt unable )   See FIM for current functional status  Windell Moulding, M.A. CCC-SLP  Selinda Orion Siyon Linck 04/26/2014, 9:35 AM

## 2014-04-26 NOTE — Progress Notes (Signed)
Social Work  Social Work Assessment and Plan  Patient Details  Name: Matthew Beard MRN: [id number] Date of Birth: 19-Mar-1976  Today's Date: 04/23/2014  Problem List:  Patient Active Problem List   Diagnosis Date Noted  . Convulsions/seizures 04/07/2014  . Encephalopathy 04/07/2014  . Aspiration pneumonia 04/07/2014  . Dysphagia, unspecified(787.20) 04/07/2014  . Noncompliance with medication regimen in past- mult adm for uncontrolled HTN 03/31/2014  . Intracerebral hemorrhage 03/30/2014  . ICH (intracerebral hemorrhage) 03/30/2014  . ESRD- HD since April 2015 03/08/2014  . Cardiomyopathy EF 25-30%- etiol undetermined- EF varies- 20% Aug 2014, 40% Nov 2014, 50% March 2015 09/11/2013  . Autoimmune disorder 09/11/2013  . History of noncompliance with medical treatment 07/04/2013  . Uncontrolled hypertension 08/10/2011  . Anemia in chronic kidney disease 08/10/2011  . Mixed connective tissue disease 08/10/2011   Past Medical History:  Past Medical History  Diagnosis Date  . Hypertension   . CKD (chronic kidney disease), stage IV   . Normocytic anemia   . Pericardial effusion     Remote hx  . Cerebrovascular disease     Chronic lacunar infarcts  . NICM (nonischemic cardiomyopathy)   . CHF (congestive heart failure)   . Shortness of breath    Past Surgical History:  Past Surgical History  Procedure Laterality Date  . Av fistula placement Left 10/19/2013    Procedure: ARTERIOVENOUS (AV) FISTULA CREATION- LEFT RADIOCEPHALIC;  Surgeon: Larina Earthlyodd F Early, MD;  [facility];  Service: Vascular;  Laterality: Left;   Social History:  reports that he has never smoked. He has never used smokeless tobacco. He reports that he does not drink alcohol or use illicit drugs.  Family / Support Systems Marital Status: Single Patient Roles: Partner Spouse/Significant Other: fiance', Arnoldo MoraleStephanie Gause @ (C) [phone] (together x 9 yrs.) Children: pt has a 615 yo son, Tonye BecketDamien, who lives locally with  his mother. Other Supports: brother, Daniel Nonesnthony Vantol @ [phone](C) [phone] and his wife, Albin FellingCarla @ [phone](C) [phone];  nephew, De'twane and pt's aunt, Lavell Anchorsarlene Henry Anticipated Caregiver: sister in law, Albin FellingCarla Ability/Limitations of Caregiver: no limitations Caregiver Availability: 24/7 Family Dynamics: brother and sister-in-law very supportive as are his extended family members.  Good relationship between them and pt's fiancee'  Social History Preferred language: English Religion: Non-Denominational Cultural Background: NA Education: HS Read: Yes Write: Yes Employment Status: Unemployed Date Retired/Disabled/Unemployed: per fiancee', pt just approved for SSDI Legal Hisotry/Current Legal Issues: none Guardian/Conservator: none - per MD, pt not capable of making decisions on his own behalf.  Decision making deferred to brother.   Abuse/Neglect Physical Abuse: Denies Verbal Abuse: Denies Sexual Abuse: Denies Exploitation of patient/patient's resources: Denies Self-Neglect: Denies  Emotional Status Pt's affect, behavior adn adjustment status: pt non-responsive to any attempts of mine to engage.  Simply stares ahead.  Does not respond to his fiancee' either.   Recent Psychosocial Issues: declining health and fiancee feels he has been "depressed" for a long time. Pyschiatric History: see above - never received any formal treatment. Substance Abuse History: NA  Patient / Family Perceptions, Expectations & Goals Pt/Family understanding of illness & functional limitations: fiancee and brother with general understanding of multiple medical issues Premorbid pt/family roles/activities: pt with declining independence over past few weeks Anticipated changes in roles/activities/participation: will likely require 24/7 care indefinitely Pt/family expectations/goals: fiancee and brother hopeful he will respond to therapy  [employer] Community Agencies: Other (Comment) [facility] Premorbid  Home Care/DME Agencies: None Transportation available at discharge: yes Resource  referrals recommended: Neuropsychology;Support group (specify)  Discharge Planning Living Arrangements: Spouse/significant other Support Systems: Spouse/significant other;Children;Other relatives Type of Residence: Private residence Insurance Resources: Medicaid (specify county) Financial Resources: Ambulance person Screen Referred: No Living Expenses: Psychologist, sport and exercise Management: Significant Other Does the patient have any problems obtaining your medications?: No Home Management: fiancee Patient/Family Preliminary Plans: fiancee aware discussion of pt to dc home with brother and reluctantly agreeable Social Work Anticipated Follow Up Needs: HH/OP;Support Group Expected length of stay: 18-24 days  Clinical Impression Very unfortunate young man who is non-responsive to staff and limited participation in therapies.  Family supportive, however, may not benefit from CIR if arousal does not improve.  Will follow for support and d/c planning.  Amada Jupiter 04/23/2014, 8:56 AM

## 2014-04-26 NOTE — Plan of Care (Signed)
Problem: RH Swallowing Goal: LTG Patient will consume least restrictive PO diet (SLP) LTG: Patient will consume least restrictive PO diet with assist for use of compensatory strategies (SLP)  Outcome: Not Met (add Reason) Pt with significant decline in function and is now comfort care per family Goal: LTG Patient will participate in dysphagia therapy (SLP) LTG: Patient will participate in dysphagia therapy with assist to increase swallow function as evidenced by bedside or objective clinical assessment (SLP)  Outcome: Not Met (add Reason) Pt with significant decline in function and is now comfort care per family  Goal: LTG Patient will demonstrate a functional change in (SLP) LTG: Patient will demonstrate a functional change in oral/oropharyngeal swallow as evidenced by an objective assessment (SLP)  Pt with significant decline in function and is now comfort care per family.

## 2014-04-26 NOTE — Plan of Care (Signed)
Problem: RH Balance Goal: LTG Patient will maintain dynamic sitting balance (PT) LTG: Patient will maintain dynamic sitting balance with assistance during mobility activities (PT)  Outcome: Not Met (add Reason) Change in medical status, d/c from therapies per family request to pursue hospice/palliative care.  Goal: LTG Patient will maintain dynamic standing balance (PT) LTG: Patient will maintain dynamic standing balance with assistance during mobility activities (PT)  Outcome: Not Met (add Reason) Change in medical status, d/c from therapies per family request to pursue hospice/palliative care.   Problem: RH Bed Mobility Goal: LTG Patient will perform bed mobility with assist (PT) LTG: Patient will perform bed mobility with assistance, with/without cues (PT).  Outcome: Not Met (add Reason) Change in medical status, d/c from therapies per family request to pursue hospice/palliative care.   Problem: RH Bed to Chair Transfers Goal: LTG Patient will perform bed/chair transfers w/assist (PT) LTG: Patient will perform bed/chair transfers with assistance, with/without cues (PT).  Outcome: Not Met (add Reason) Change in medical status, d/c from therapies per family request to pursue hospice/palliative care.   Problem: RH Furniture Transfers Goal: LTG Patient will perform furniture transfers w/assist (OT/PT LTG: Patient will perform furniture transfers with assistance (OT/PT).  Outcome: Not Met (add Reason) Change in medical status, d/c from therapies per family request to pursue hospice/palliative care.   Problem: RH Ambulation Goal: LTG Patient will ambulate in controlled environment (PT) LTG: Patient will ambulate in a controlled environment, # of feet with assistance (PT).  Outcome: Not Met (add Reason) Change in medical status, d/c from therapies per family request to pursue hospice/palliative care.  Goal: LTG Patient will ambulate in home environment (PT) LTG: Patient will ambulate in  home environment, # of feet with assistance (PT).  Outcome: Not Met (add Reason) Change in medical status, d/c from therapies per family request to pursue hospice/palliative care.   Problem: RH Wheelchair Mobility Goal: LTG Patient will propel w/c in controlled environment (PT) LTG: Patient will propel wheelchair in controlled environment, # of feet with assist (PT)  Outcome: Not Met (add Reason) Change in medical status, d/c from therapies per family request to pursue hospice/palliative care.   Problem: RH Other (Specify) Goal: RH LTG Other (Specify) Outcome: Not Met (add Reason) Change in medical status, d/c from therapies per family request to pursue hospice/palliative care.   Problem: RH Attention Goal: LTG Patient will demonstrate focused/sustained (PT) LTG: Patient will demonstrate focused/sustained/selective/alternating/divided attention during functional mobility in specific environment with assist for # of minutes (PT)  Outcome: Not Met (add Reason) Change in medical status, d/c from therapies per family request to pursue hospice/palliative care.

## 2014-04-26 NOTE — Progress Notes (Signed)
Physical Therapy Discharge Summary  Patient Details  Name: Matthew Beard MRN: [id number] Date of Birth: February 10, 1976  Today's Date: 04/26/2014  Pt has made limited and inconsistent progress while on CIR due to decline in functional status and has met 0/10 goals. Per chair review, pt's family wishes pt to be placed on comfort care with no further therapy from PT/OT/SLP. Pt to discharge at and overall max assist level for limited mobility of bed mobility and transfers due to decreased functional endurance, cognitive deficits, lack of initiation, impaired focused attention and decreased gross strength/coordination. Patient's family unable to provide the necessary physical and cognitive assistance at discharge. Pt to d/c to hospice/palliative care.   Reasons goals not met: Change in medical status. Pt's family pursuing hospice/palliative care.   Recommendation:  Pt to d/c to hospice/palliative care.   Equipment: No equipment provided  Reasons for discharge: change in medical status and lack of progress toward goals  See FIM for current functional status  Gilmore Laroche 04/26/2014, 11:10 AM

## 2014-04-26 NOTE — Progress Notes (Signed)
Social Work Patient ID: Matthew Beard, male   DOB: 1976-07-20, 39 y.o.   MRN: [id number]  Alerted by PA this morning of events over past weekend and pt now with Hospice involvement and possible transfer to [facility].  Contacted Forrestine Him, SW with [facility] who confirms there is a bed available today and could plan transfer this afternoon.  Have notified pt's brother and fiance who are in agreement with transfer.  Have scheduled for them to meet with Carley Hammed @ 12:00 to complete paperwork.  Tx team alerted as well.  Continue to follow.  Amada Jupiter, LCSW

## 2014-04-26 NOTE — Plan of Care (Signed)
Problem: RH Balance Goal: LTG: Patient will maintain dynamic sitting balance (OT) LTG: Patient will maintain dynamic sitting balance with assistance during activities of daily living (OT)  Outcome: Not Met (add Reason) Change in medical status, d/c from therapies per family request to pursue hospice/palliative care    Goal: LTG Patient will maintain dynamic standing with ADLs (OT) LTG: Patient will maintain dynamic standing balance with assist during activities of daily living (OT)  Outcome: Not Met (add Reason) Change in medical status, d/c from therapies per family request to pursue hospice/palliative care     Problem: RH Eating Goal: LTG Patient will perform eating w/assist, cues/equip (OT) LTG: Patient will perform eating with assist, with/without cues using equipment (OT)  Outcome: Not Met (add Reason) Change in medical status, d/c from therapies per family request to pursue hospice/palliative care     Problem: RH Grooming Goal: LTG Patient will perform grooming w/assist,cues/equip (OT) LTG: Patient will perform grooming with assist, with/without cues using equipment (OT)  Outcome: Not Met (add Reason) Change in medical status, d/c from therapies per family request to pursue hospice/palliative care     Problem: RH Bathing Goal: LTG Patient will bathe with assist, cues/equipment (OT) LTG: Patient will bathe specified number of body parts with assist with/without cues using equipment (position) (OT)  Outcome: Not Met (add Reason) Change in medical status, d/c from therapies per family request to pursue hospice/palliative care     Problem: RH Dressing Goal: LTG Patient will perform upper body dressing (OT) LTG Patient will perform upper body dressing with assist, with/without cues (OT).  Outcome: Not Met (add Reason) Change in medical status, d/c from therapies per family request to pursue hospice/palliative care    Goal: LTG Patient will perform lower body dressing w/assist  (OT) LTG: Patient will perform lower body dressing with assist, with/without cues in positioning using equipment (OT)  Outcome: Not Met (add Reason) Change in medical status, d/c from therapies per family request to pursue hospice/palliative care     Problem: RH Toileting Goal: LTG Patient will perform toileting w/assist, cues/equip (OT) LTG: Patient will perform toiletiing (clothes management/hygiene) with assist, with/without cues using equipment (OT)  Outcome: Not Met (add Reason) Change in medical status, d/c from therapies per family request to pursue hospice/palliative care     Problem: RH Toilet Transfers Goal: LTG Patient will perform toilet transfers w/assist (OT) LTG: Patient will perform toilet transfers with assist, with/without cues using equipment (OT)  Outcome: Not Met (add Reason) Change in medical status, d/c from therapies per family request to pursue hospice/palliative care     Problem: RH Tub/Shower Transfers Goal: LTG Patient will perform tub/shower transfers w/assist (OT) LTG: Patient will perform tub/shower transfers with assist, with/without cues using equipment (OT)  Outcome: Not Met (add Reason) Change in medical status, d/c from therapies per family request to pursue hospice/palliative care     Problem: RH Attention Goal: LTG Patient will demonstrate focused/sustained (OT) LTG: Patient will demonstrate focused/sustained/selective/alternating/divided attention during functional activities in specific environment with assist for # of minutes (OT)  Outcome: Not Met (add Reason) Change in medical status, d/c from therapies per family request to pursue hospice/palliative care     Problem: RH Furniture Transfers Goal: LTG Patient will perform furniture transfers w/assist (OT/PT LTG: Patient will perform furniture transfers with assistance (OT/PT).  Outcome: Not Met (add Reason) Change in medical status, d/c from therapies per family request to pursue  hospice/palliative care     Problem: RH Other (Specify) Goal:  RH LTG Other (Specify) Outcome: Not Met (add Reason) Change in medical status, d/c from therapies per family request to pursue hospice/palliative care

## 2014-04-27 NOTE — Patient Care Conference (Signed)
Inpatient RehabilitationTeam Conference and Plan of Care Update Date: 04/26/2014   Time: 4:00 PM    Patient Name: Matthew Beard      Medical Record Number: [id number]  Date of Birth: [birth year] Sex: Male         Room/Bed: 4W03C/4W03C-01 Payor Info: Payor: MEDICAID [place] / Plan: MEDICAID [place] ACCESS / Product Type: *No Product type* /    Admitting Diagnosis: L ICH WITH HD  Admit Date/Time:  04/21/2014  3:27 PM Admission Comments: No comment available   Primary Diagnosis:  <principal problem not specified> Principal Problem: <principal problem not specified>  Patient Active Problem List   Diagnosis Date Noted  . Convulsions/seizures 04/07/2014  . Encephalopathy 04/07/2014  . Aspiration pneumonia 04/07/2014  . Dysphagia, unspecified(787.20) 04/07/2014  . Noncompliance with medication regimen in past- mult adm for uncontrolled HTN 03/31/2014  . Intracerebral hemorrhage 03/30/2014  . ICH (intracerebral hemorrhage) 03/30/2014  . ESRD- HD since April 2015 03/08/2014  . Cardiomyopathy EF 25-30%- etiol undetermined- EF varies- 20% Aug 2014, 40% Nov 2014, 50% March 2015 09/11/2013  . Autoimmune disorder 09/11/2013  . History of noncompliance with medical treatment 07/04/2013  . Uncontrolled hypertension 08/10/2011  . Anemia in chronic kidney disease 08/10/2011  . Mixed connective tissue disease 08/10/2011    Expected Discharge Date: Expected Discharge Date: 04/26/14  Team Members Present: Physician leading conference: Dr. Faith Rogue Social Worker Present: Amada Jupiter, LCSW Nurse Present: Carlean Purl, RN PT Present: Zerita Boers, PT OT Present: Ardis Rowan, COTA SLP Present: Other (comment) Joni Reining Page, ST)     Current Status/Progress Goal Weekly Team Focus  Medical   ICH, renal failure. cognitive, physical decline this week  comfort care  hospice   Bowel/Bladder   Incontinent bowel and bladder  Continent with timed toileting  assist with urinal q 3 hours    Swallow/Nutrition/ Hydration   dys 1, nectar thick liquids, downgraded due to fluctuating arousal which impacted his swallowing safety  Supervision with least restrictive diet   discharge from therapy per MD   ADL's   max A/tot A overall  supervision overall/min A LB dressing  discharge from therapy per MD   Mobility   max Ax1 person overall  supervision overall  discharge from therapy per MD   Communication   max-total assist, severe global aphasia    min assist   discharge from therapy per MD   Safety/Cognition/ Behavioral Observations  max-total assist due to initiation, attention, arousal   min assist-supervision   discharge from therapy per MD   Pain   Denies  Pain managed  Observe for non-verbal s/s pain   Skin   WNL  remain free from injury, S/S infection  Reposition, check q1 hour for incontinence    Rehab Goals Patient on target to meet rehab goals: No Rehab Goals Revised: declining medical status and referred to [facility] *See Care Plan and progress notes for long and short-term goals.  Barriers to Discharge: medical condition    Possible Resolutions to Barriers:  hospice    Discharge Planning/Teaching Needs:  Plan changed to [facility] due to acutely declining medical status.      Team Discussion:  Pt with declining medical status and referral place for Hospice.  Decision made to stop HD and transfer pt to [facility] today  Revisions to Treatment Plan:  D/c to Hospice facility for end of life care.   Continued Need for Acute Rehabilitation Level of Care: The patient requires daily medical management by a  physician with specialized training in physical medicine and rehabilitation for the following conditions: Daily direction of a multidisciplinary physical rehabilitation program to ensure safe treatment while eliciting the highest outcome that is of practical value to the patient.: Yes Daily medical management of patient stability for increased  activity during participation in an intensive rehabilitation regime.: Yes Daily analysis of laboratory values and/or radiology reports with any subsequent need for medication adjustment of medical intervention for : Neurological problems;Post surgical problems;Cardiac problems  Amada JupiterLucy Mahad Newstrom 04/27/2014, 9:17 AM

## 2014-04-27 NOTE — Progress Notes (Signed)
Social Work  Discharge Note  The overall goal for the admission was met for:   Discharge location: No - transferred to [facility] for end of life care  Length of Stay: No - 5 days  Discharge activity level: No - never truly able to participate on meeting goals due to medical issues  Home/community participation: No  Services provided included: MD, RD, PT, OT, SLP, RN, Pharmacy and SW  Financial Services: Medicaid  Follow-up services arranged: Other: Optometrist ([place] facility)  Comments (or additional information):  Patient/Family verbalized understanding of follow-up arrangements: Yes  Individual responsible for coordination of the follow-up plan: brother  Confirmed correct DME delivered: NA    [employer]

## 2014-04-29 ENCOUNTER — Encounter: Payer: Self-pay | Admitting: Cardiology

## 2014-04-30 LAB — URINE CULTURE: Colony Count: >=100000

## 2014-05-19 DEATH — deceased

## 2015-07-09 IMAGING — CT CT HEAD W/O CM
2 series · 16 of 30 positions shown, 20 images · non-contrast
Comparison: None.

CLINICAL DATA: Confusion

EXAM:
CT HEAD WITHOUT CONTRAST
TECHNIQUE: Contiguous axial images were obtained from the base of the skull
through the vertex without intravenous contrast.

[Series 2: head 5.0 h30s · axial · 0.42mm/px · z∈[-114,+6]mm · 13 of 30 slices shown, 17 images]
[im 3/30  brain]
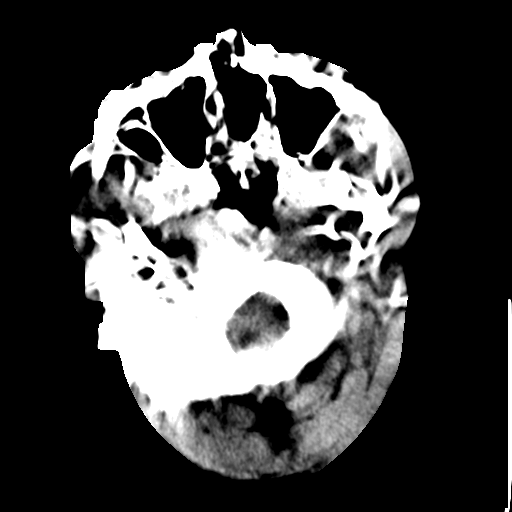
[im 3/30  bone]
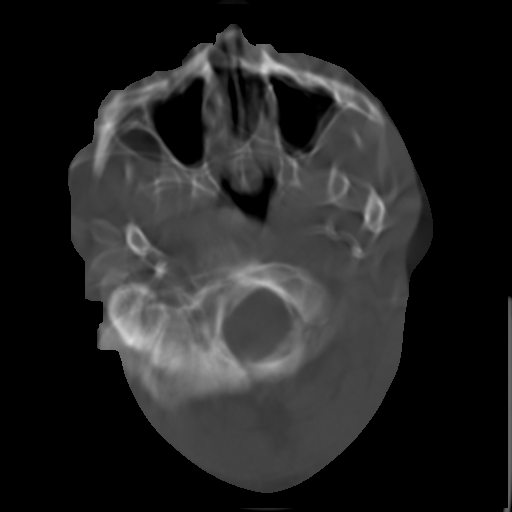
[im 5/30  brain]
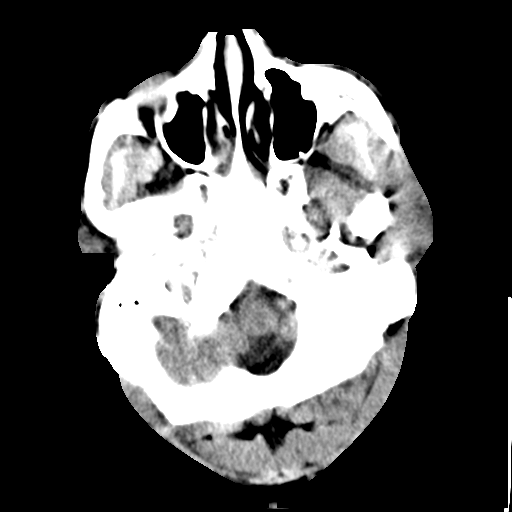
[im 7/30  brain]
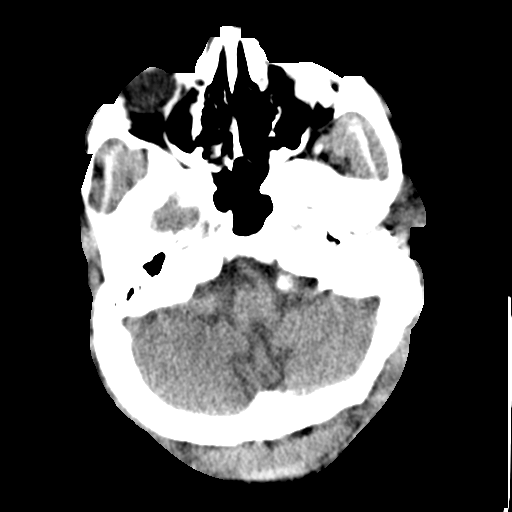
[im 9/30  brain]
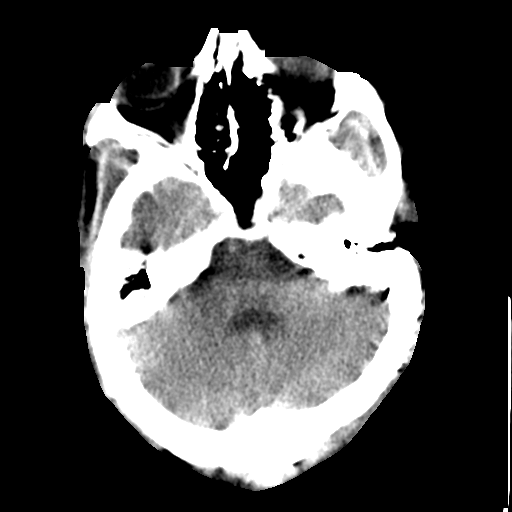
[im 11/30  brain]
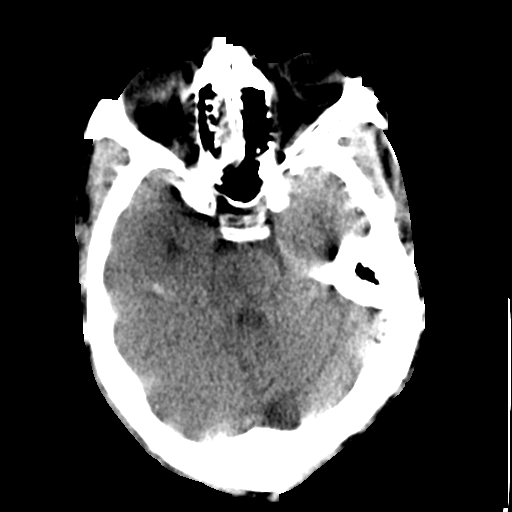
[im 11/30  bone]
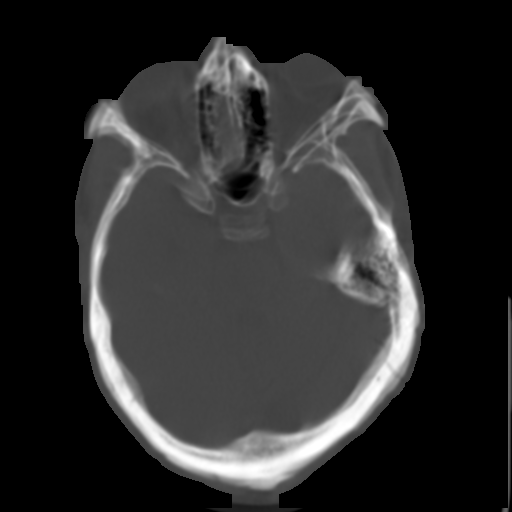
[im 13/30  brain]
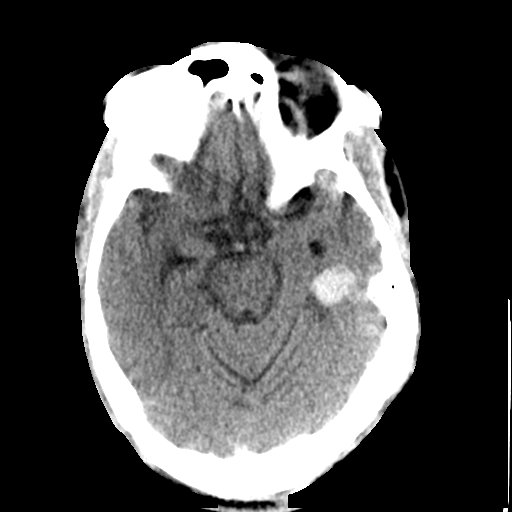
[im 15/30  brain]
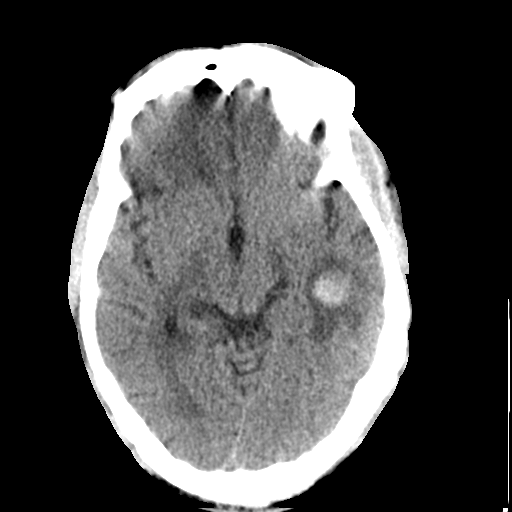
[im 17/30  brain]
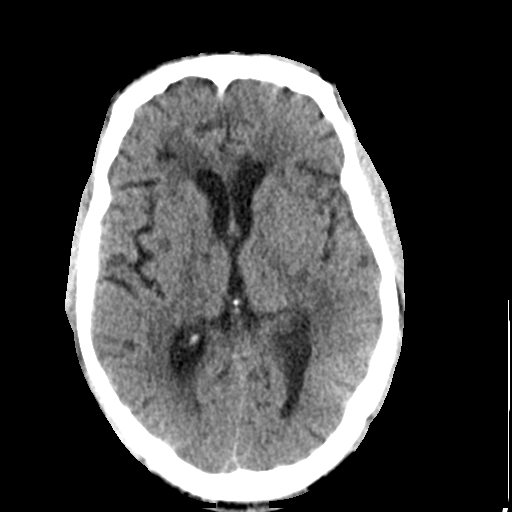
[im 19/30  brain]
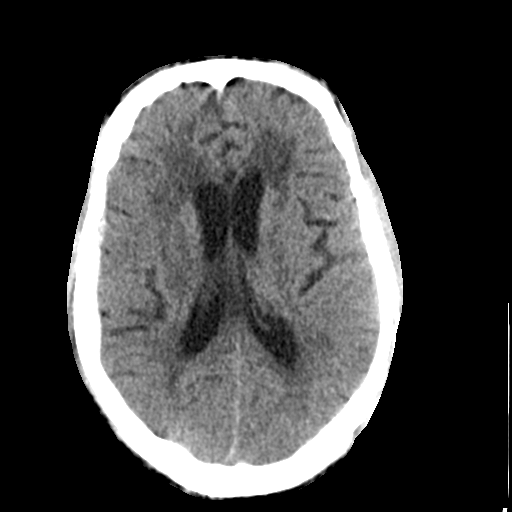
[im 19/30  bone]
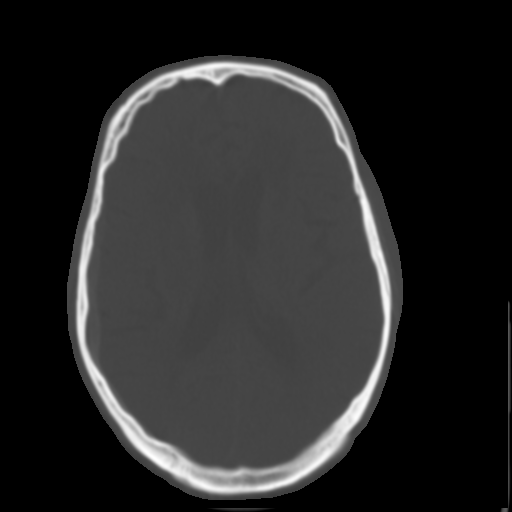
[im 21/30  brain]
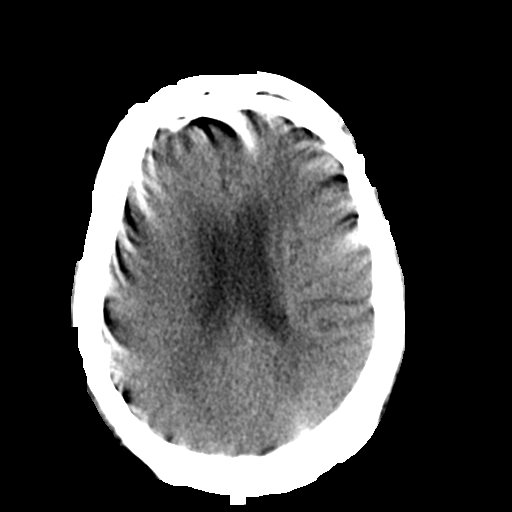
[im 23/30  brain]
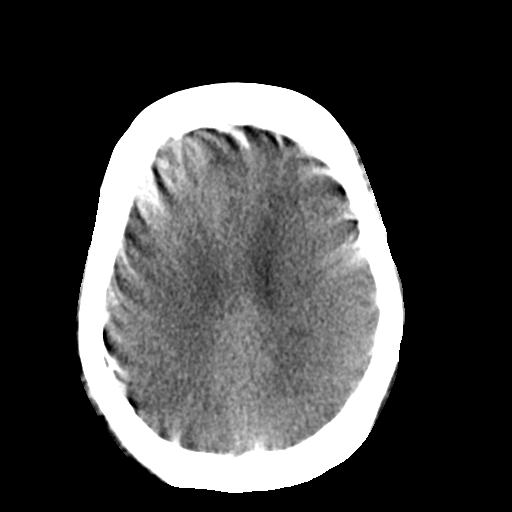
[im 25/30  brain]
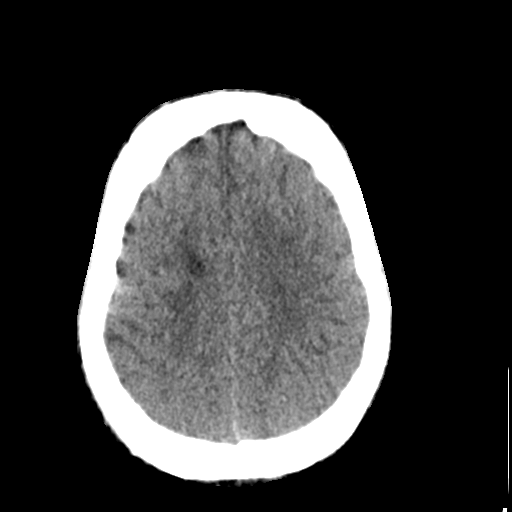
[im 27/30  brain]
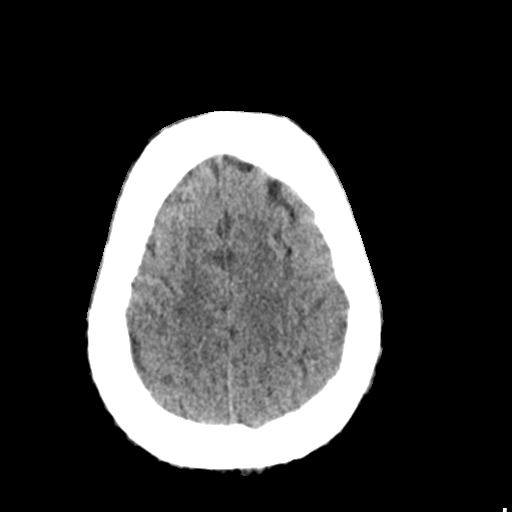
[im 27/30  bone]
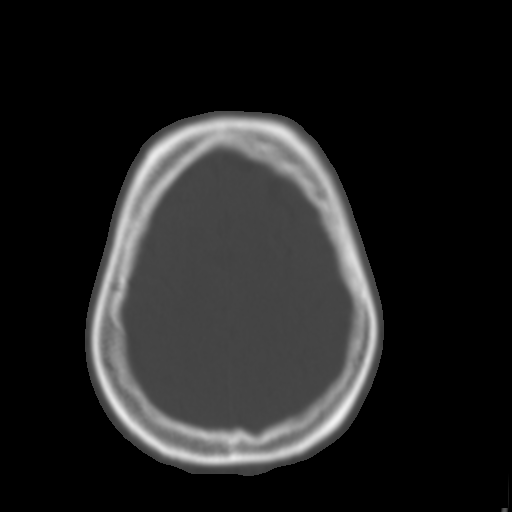

[Series 4: head 5.0 h30f · axial · 0.42mm/px · z∈[-126,-81]mm · 3 of 32 slices shown]
[im 3/32  brain]
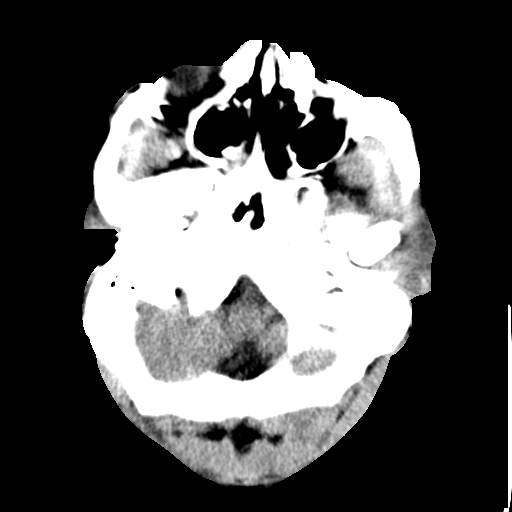
[im 7/32  brain]
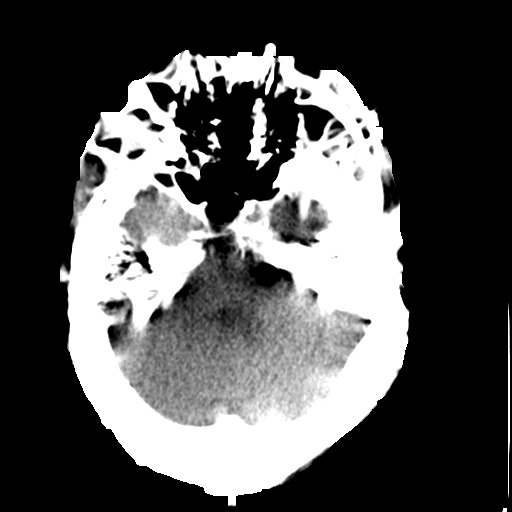
[im 12/32  brain]
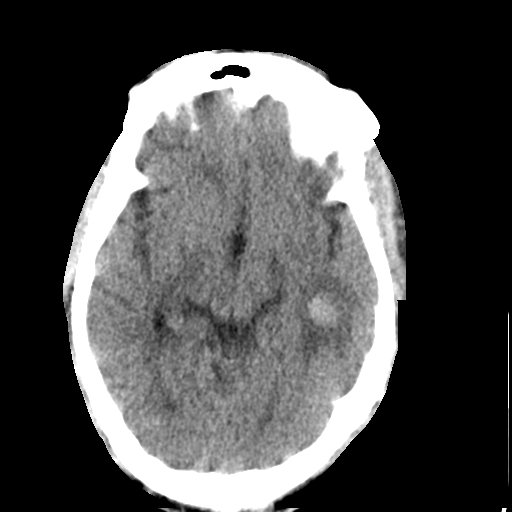

[16 of 30 positions shown; findings below may reference images not displayed]

FINDINGS: The examination is significantly limited by patient motion artifact.
Mild atrophic changes and chronic white matter ischemic changes are
seen. There is a focus of parenchymal hemorrhage at the junction of
the left temporal and left parietal lobes which measures 1.6 x
cm. No other focal abnormality is noted.
IMPRESSION: Significantly limited exam by patient motion artifact.

A focus of hemorrhage is noted as described above at the junction of
the left temporal and parietal lobes. Mild surrounding edema is
noted.

Critical Value/emergent results were called by telephone at the time
of interpretation on 03/29/2014 at [DATE] to the emergency room
physician, who verbally acknowledged these results.
